# Patient Record
Sex: Male | Born: 1960
Health system: Southern US, Community
[De-identification: ages and names within clinical notes are randomized; demographics above are authoritative.]

## PROBLEM LIST (undated history)

## (undated) DIAGNOSIS — R569 Unspecified convulsions: Secondary | ICD-10-CM

## (undated) DIAGNOSIS — C801 Malignant (primary) neoplasm, unspecified: Secondary | ICD-10-CM

## (undated) DIAGNOSIS — R519 Headache, unspecified: Secondary | ICD-10-CM

---

## 2020-06-09 ENCOUNTER — Other Ambulatory Visit: Payer: Self-pay

## 2020-06-09 ENCOUNTER — Emergency Department (HOSPITAL_COMMUNITY): Payer: Self-pay

## 2020-06-09 ENCOUNTER — Inpatient Hospital Stay (HOSPITAL_COMMUNITY)
Admission: EM | Admit: 2020-06-09 | Discharge: 2020-06-16 | DRG: 025 | Disposition: A | Discharge status: Home / Self Care | Payer: Self-pay | Attending: Internal Medicine | Admitting: Internal Medicine

## 2020-06-09 DIAGNOSIS — C719 Malignant neoplasm of brain, unspecified: Secondary | ICD-10-CM

## 2020-06-09 DIAGNOSIS — Z781 Physical restraint status: Secondary | ICD-10-CM

## 2020-06-09 DIAGNOSIS — Z20822 Contact with and (suspected) exposure to covid-19: Secondary | ICD-10-CM | POA: Diagnosis present

## 2020-06-09 DIAGNOSIS — G40901 Epilepsy, unspecified, not intractable, with status epilepticus: Secondary | ICD-10-CM | POA: Diagnosis present

## 2020-06-09 DIAGNOSIS — R411 Anterograde amnesia: Secondary | ICD-10-CM | POA: Diagnosis present

## 2020-06-09 DIAGNOSIS — R410 Disorientation, unspecified: Secondary | ICD-10-CM

## 2020-06-09 DIAGNOSIS — G9389 Other specified disorders of brain: Secondary | ICD-10-CM

## 2020-06-09 DIAGNOSIS — G9341 Metabolic encephalopathy: Secondary | ICD-10-CM | POA: Diagnosis present

## 2020-06-09 DIAGNOSIS — D496 Neoplasm of unspecified behavior of brain: Principal | ICD-10-CM | POA: Diagnosis present

## 2020-06-09 DIAGNOSIS — E871 Hypo-osmolality and hyponatremia: Secondary | ICD-10-CM | POA: Diagnosis present

## 2020-06-09 DIAGNOSIS — E222 Syndrome of inappropriate secretion of antidiuretic hormone: Secondary | ICD-10-CM | POA: Diagnosis present

## 2020-06-09 LAB — COMPREHENSIVE METABOLIC PANEL
ALT: 23 U/L (ref 0–44)
AST: 30 U/L (ref 15–41)
Albumin: 4.5 g/dL (ref 3.5–5.0)
Alkaline Phosphatase: 55 U/L (ref 38–126)
Anion gap: 11 (ref 5–15)
BUN: 12 mg/dL (ref 6–20)
CO2: 25 mmol/L (ref 22–32)
Calcium: 8.9 mg/dL (ref 8.9–10.3)
Chloride: 90 mmol/L — ABNORMAL LOW (ref 98–111)
Creatinine, Ser: 0.78 mg/dL (ref 0.61–1.24)
GFR, Estimated: >60 mL/min (ref >60)
Glucose, Bld: 142 mg/dL — ABNORMAL HIGH (ref 70–99)
Potassium: 3.8 mmol/L (ref 3.5–5.1)
Sodium: 126 mmol/L — ABNORMAL LOW (ref 135–145)
Total Bilirubin: 1 mg/dL (ref 0.3–1.2)
Total Protein: 7.7 g/dL (ref 6.5–8.1)

## 2020-06-09 LAB — DIFFERENTIAL
Abs Immature Granulocytes: 0.07 10*3/uL (ref 0.00–0.07)
Basophils Absolute: 0 10*3/uL (ref 0.0–0.1)
Basophils Relative: 0 %
Eosinophils Absolute: 0 10*3/uL (ref 0.0–0.5)
Eosinophils Relative: 0 %
Immature Granulocytes: 1 %
Lymphocytes Relative: 9 %
Lymphs Abs: 0.8 10*3/uL (ref 0.7–4.0)
Monocytes Absolute: 0.4 10*3/uL (ref 0.1–1.0)
Monocytes Relative: 5 %
Neutro Abs: 7.7 10*3/uL (ref 1.7–7.7)
Neutrophils Relative %: 85 %

## 2020-06-09 LAB — CBC
HCT: 43.6 % (ref 39.0–52.0)
Hemoglobin: 14.9 g/dL (ref 13.0–17.0)
MCH: 28.1 pg (ref 26.0–34.0)
MCHC: 34.2 g/dL (ref 30.0–36.0)
MCV: 82.1 fL (ref 80.0–100.0)
Platelets: 307 10*3/uL (ref 150–400)
RBC: 5.31 MIL/uL (ref 4.22–5.81)
RDW: 12.7 % (ref 11.5–15.5)
WBC: 9.1 10*3/uL (ref 4.0–10.5)
nRBC: 0 % (ref 0.0–0.2)

## 2020-06-09 LAB — RAPID URINE DRUG SCREEN, HOSP PERFORMED
Amphetamines: NOT DETECTED
Barbiturates: NOT DETECTED
Benzodiazepines: NOT DETECTED
Cocaine: NOT DETECTED
Opiates: NOT DETECTED
Tetrahydrocannabinol: NOT DETECTED

## 2020-06-09 LAB — URINALYSIS, ROUTINE W REFLEX MICROSCOPIC
Bacteria, UA: NONE SEEN
Bilirubin Urine: NEGATIVE
Glucose, UA: 150 mg/dL — AB
Hgb urine dipstick: NEGATIVE
Ketones, ur: 5 mg/dL — AB
Leukocytes,Ua: NEGATIVE
Nitrite: NEGATIVE
Protein, ur: 30 mg/dL — AB
Specific Gravity, Urine: 1.028 (ref 1.005–1.030)
pH: 5 (ref 5.0–8.0)

## 2020-06-09 LAB — I-STAT CHEM 8, ED
BUN: 13 mg/dL (ref 6–20)
Calcium, Ion: 0.96 mmol/L — ABNORMAL LOW (ref 1.15–1.40)
Chloride: 92 mmol/L — ABNORMAL LOW (ref 98–111)
Creatinine, Ser: 0.6 mg/dL — ABNORMAL LOW (ref 0.61–1.24)
Glucose, Bld: 138 mg/dL — ABNORMAL HIGH (ref 70–99)
HCT: 43 % (ref 39.0–52.0)
Hemoglobin: 14.6 g/dL (ref 13.0–17.0)
Potassium: 3.9 mmol/L (ref 3.5–5.1)
Sodium: 126 mmol/L — ABNORMAL LOW (ref 135–145)
TCO2: 26 mmol/L (ref 22–32)

## 2020-06-09 LAB — PROTIME-INR
INR: 1 (ref 0.8–1.2)
Prothrombin Time: 12.6 seconds (ref 11.4–15.2)

## 2020-06-09 LAB — CBG MONITORING, ED: Glucose-Capillary: 129 mg/dL — ABNORMAL HIGH (ref 70–99)

## 2020-06-09 LAB — ETHANOL: Alcohol, Ethyl (B): <10 mg/dL (ref <10)

## 2020-06-09 LAB — RESPIRATORY PANEL BY RT PCR (FLU A&B, COVID)
Influenza A by PCR: NEGATIVE
Influenza B by PCR: NEGATIVE
SARS Coronavirus 2 by RT PCR: NEGATIVE

## 2020-06-09 LAB — APTT: aPTT: 25 seconds (ref 24–36)

## 2020-06-09 IMAGING — CT CT HEAD W/O CM
4 series · 15 of 47 positions shown, 17 images · non-contrast
Comparison: [DATE] MRI/MRA head.  [DATE] head CT.
COMPARISON: [DATE] MRI/MRA head.  [DATE] head CT.

Addendum:
CLINICAL DATA: Mental status change, unknown cause.

EXAM:
CT HEAD WITHOUT CONTRAST
TECHNIQUE: Contiguous axial images were obtained from the base of the skull
through the vertex without intravenous contrast.

[Series 4: head wo · axial · 0.43mm/px · z∈[+1258,+1384]mm · 7 of 35 slices shown, 9 images]
[im 5/35  brain]
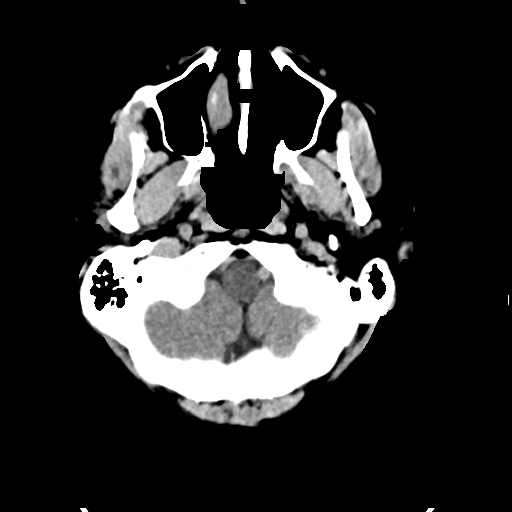
[im 5/35  bone]
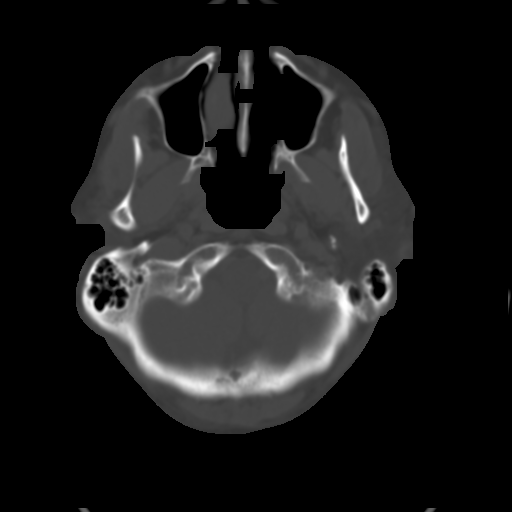
[im 9/35  brain]
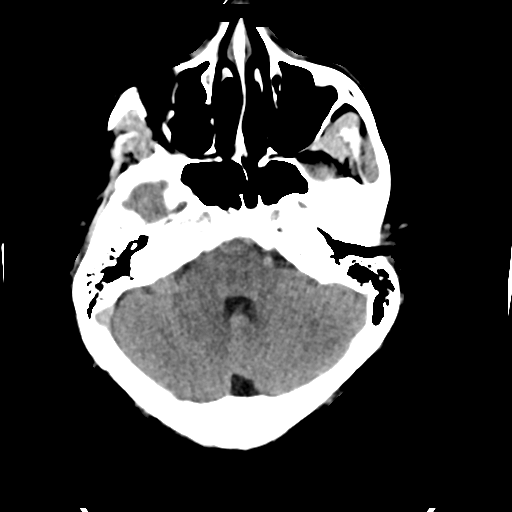
[im 13/35  brain]
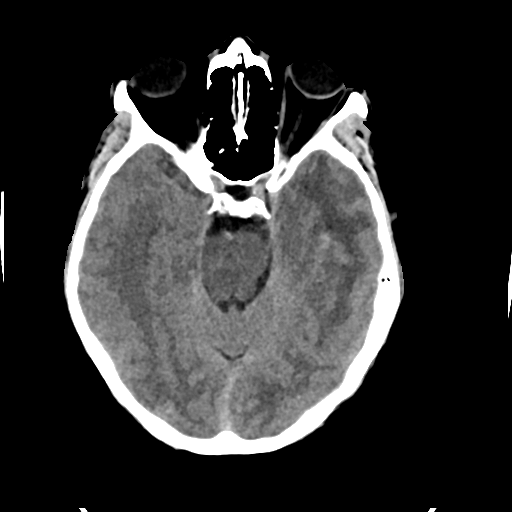
[im 18/35  brain]
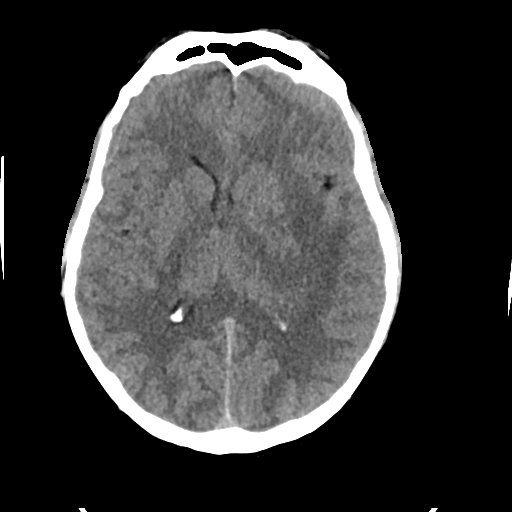
[im 22/35  brain]
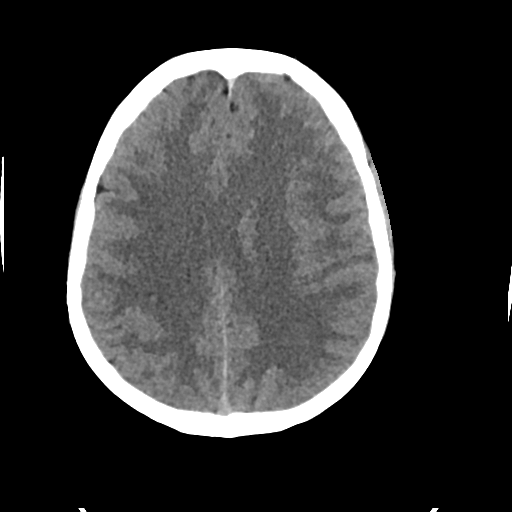
[im 22/35  bone]
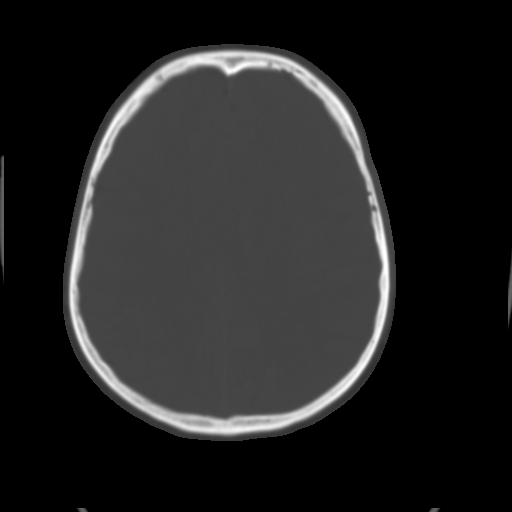
[im 26/35  brain]
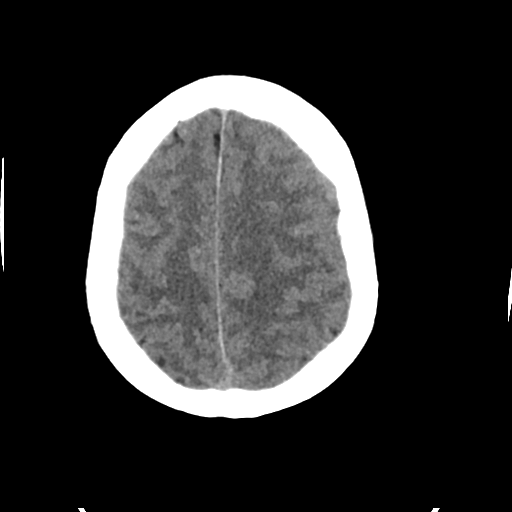
[im 30/35  brain]
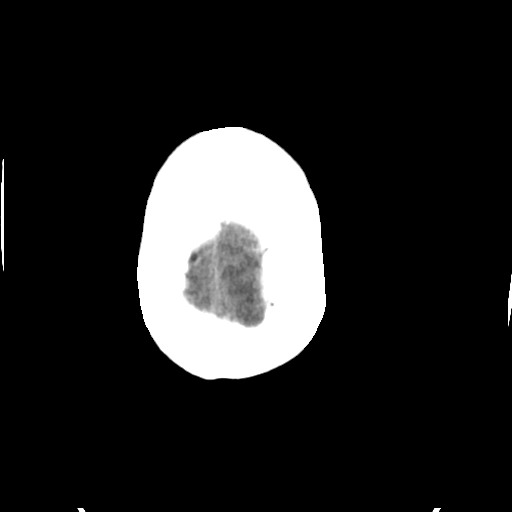

[Series 5: head bone · axial · 0.43mm/px · z∈[+1254,+1272]mm · 2 of 88 slices shown]
[im 9/88  bone]
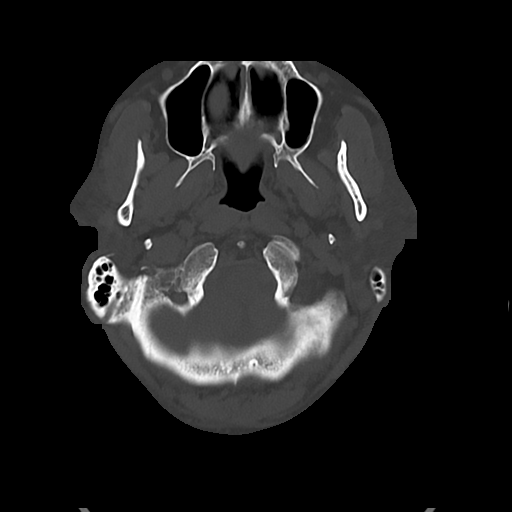
[im 18/88  bone]
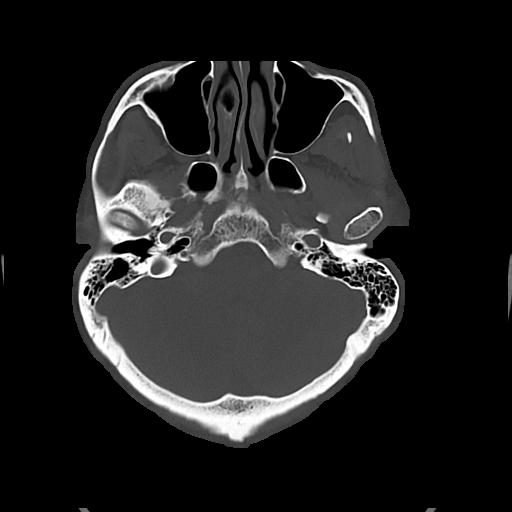

[Series 6: cor soft · coronal · 0.33mm/px · 3 of 79 slices shown]
[im 27/79  brain]
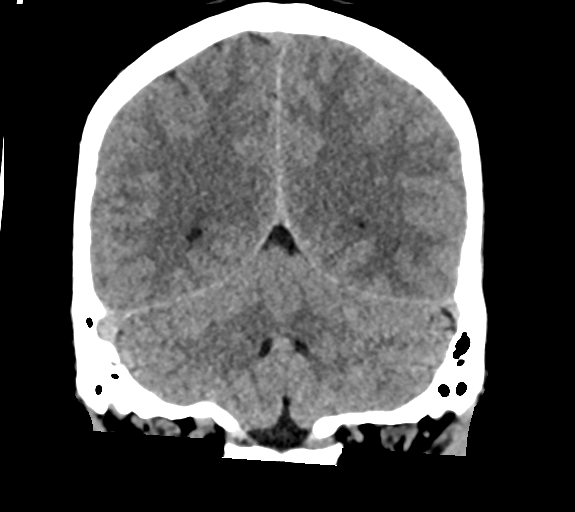
[im 35/79  brain]
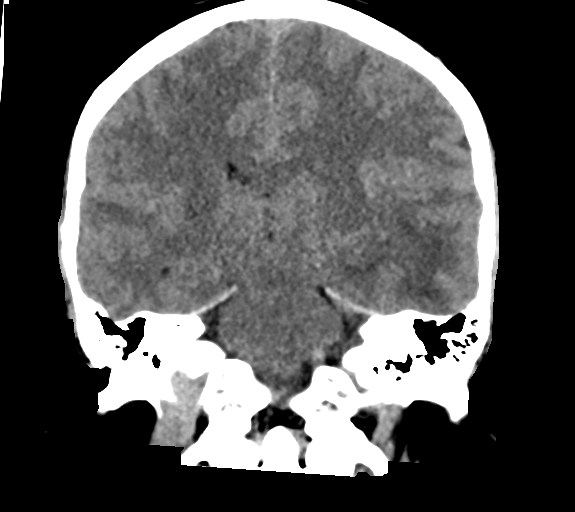
[im 44/79  brain]
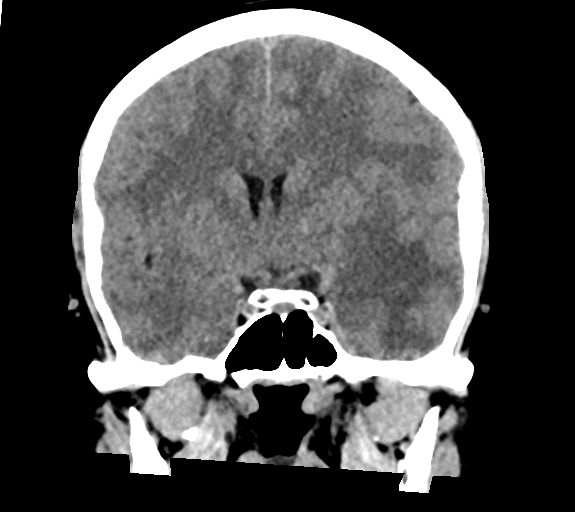

[Series 7: sag soft · sagittal · 0.42mm/px · 3 of 61 slices shown]
[im 21/61  brain]
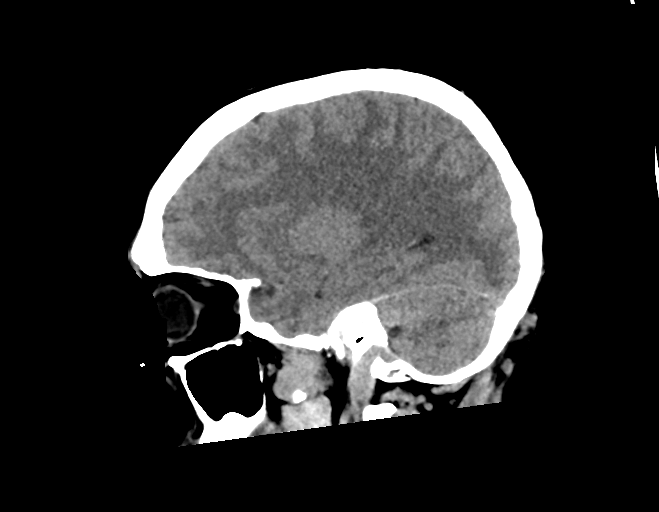
[im 31/61  brain]
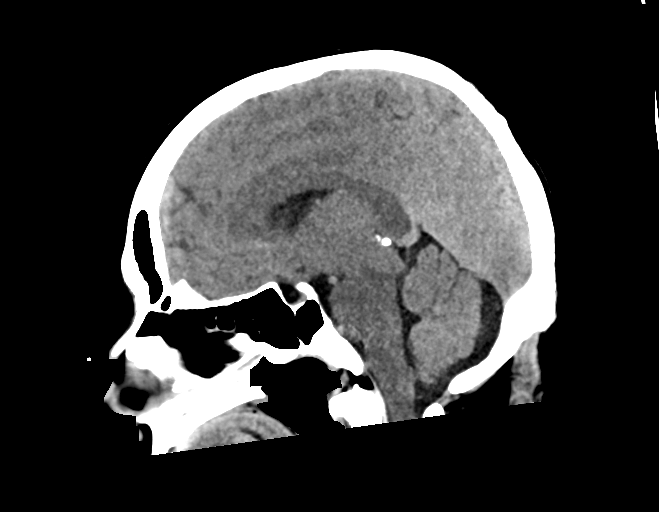
[im 41/61  brain]
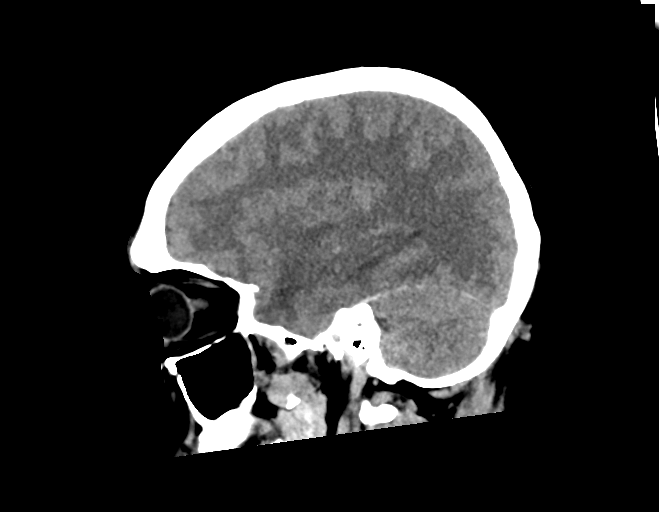

[15 of 47 positions shown; findings below may reference images not displayed]

FINDINGS: Brain: Redemonstration of infiltrative hypodensity involving the
left temporal lobe. Partial effacement of the left lateral
ventricle, increased when compared to prior MRI. Abutment of the
left midbrain by the mesial left temporal lobe appears more
conspicuous.

New rightward midline shift of 4 mm. No ventriculomegaly or
extra-axial fluid collection. No intracranial hemorrhage.

Vascular: No hyperdense vessel or unexpected calcification.

Skull: No acute finding.

Sinuses/Orbits: Normal orbits. Clear paranasal sinuses. No mastoid
effusion.

Other: None.
IMPRESSION: Redemonstration of infiltrative left temporal lesion.

Increased partial effacement of the left lateral ventricle and
abutment of the left midbrain. New rightward midline shift of 4 mm.

MRI head with and without contrast is recommended for better
evaluation.

ADDENDUM:
These results were called by telephone at the time of interpretation
on [DATE] at [DATE] to provider Dr. CENAB, who verbally
acknowledged these results.

*** End of Addendum ***
FINDINGS: Brain: Redemonstration of infiltrative hypodensity involving the
left temporal lobe. Partial effacement of the left lateral
ventricle, increased when compared to prior MRI. Abutment of the
left midbrain by the mesial left temporal lobe appears more
conspicuous.

New rightward midline shift of 4 mm. No ventriculomegaly or
extra-axial fluid collection. No intracranial hemorrhage.

Vascular: No hyperdense vessel or unexpected calcification.

Skull: No acute finding.

Sinuses/Orbits: Normal orbits. Clear paranasal sinuses. No mastoid
effusion.

Other: None.
IMPRESSION: Redemonstration of infiltrative left temporal lesion.

Increased partial effacement of the left lateral ventricle and
abutment of the left midbrain. New rightward midline shift of 4 mm.

MRI head with and without contrast is recommended for better
evaluation.

## 2020-06-09 MED ORDER — LORAZEPAM 2 MG/ML IJ SOLN
1.0000 mg | Freq: Once | INTRAMUSCULAR | Status: AC | PRN
  Administered 2020-06-10: 1 mg via INTRAVENOUS
  Filled 2020-06-09: qty 1

## 2020-06-09 NOTE — ED Triage Notes (Addendum)
Pt arrives to ED BIB [facility] for AMS. 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 Per EMS pt's boss notice that the pt was not making sense and would not stop to make his delivery. Per EMS the truck has a camera the allowed the [employer] Nurse to talk to pt and per the nurse pt was not making sense and would follow commands. Per EMS [employer] had manually turn the truck off because pt would not stop the truck when asked to. Pt's primary language is Spanish and while talking pt switches from [place] to [place] while talking. This RN asked the pt what had happened and pt stated "Someone gave me some meat from the beast to eat" Pt is currently oriented to self but not to place,time nor situation.  BP 210/120 HR 133 CBG 148

## 2020-06-09 NOTE — ED Notes (Signed)
Patient transported to CT 

## 2020-06-10 ENCOUNTER — Inpatient Hospital Stay (HOSPITAL_COMMUNITY): Payer: Self-pay

## 2020-06-10 ENCOUNTER — Encounter (HOSPITAL_COMMUNITY): Payer: Self-pay | Admitting: Internal Medicine

## 2020-06-10 ENCOUNTER — Observation Stay (HOSPITAL_COMMUNITY): Payer: Self-pay

## 2020-06-10 DIAGNOSIS — C719 Malignant neoplasm of brain, unspecified: Secondary | ICD-10-CM

## 2020-06-10 DIAGNOSIS — G9389 Other specified disorders of brain: Secondary | ICD-10-CM

## 2020-06-10 DIAGNOSIS — E871 Hypo-osmolality and hyponatremia: Secondary | ICD-10-CM

## 2020-06-10 LAB — BASIC METABOLIC PANEL
Anion gap: 9 (ref 5–15)
Anion gap: 9 (ref 5–15)
Anion gap: 8 (ref 5–15)
BUN: 9 mg/dL (ref 6–20)
BUN: 8 mg/dL (ref 6–20)
BUN: 8 mg/dL (ref 6–20)
CO2: 24 mmol/L (ref 22–32)
CO2: 25 mmol/L (ref 22–32)
CO2: 27 mmol/L (ref 22–32)
Calcium: 8.3 mg/dL — ABNORMAL LOW (ref 8.9–10.3)
Calcium: 8.4 mg/dL — ABNORMAL LOW (ref 8.9–10.3)
Calcium: 8.6 mg/dL — ABNORMAL LOW (ref 8.9–10.3)
Chloride: 92 mmol/L — ABNORMAL LOW (ref 98–111)
Chloride: 88 mmol/L — ABNORMAL LOW (ref 98–111)
Chloride: 90 mmol/L — ABNORMAL LOW (ref 98–111)
Creatinine, Ser: 0.7 mg/dL (ref 0.61–1.24)
Creatinine, Ser: 0.7 mg/dL (ref 0.61–1.24)
Creatinine, Ser: 0.71 mg/dL (ref 0.61–1.24)
GFR, Estimated: >60 mL/min (ref >60)
GFR, Estimated: >60 mL/min (ref >60)
GFR, Estimated: >60 mL/min (ref >60)
Glucose, Bld: 121 mg/dL — ABNORMAL HIGH (ref 70–99)
Glucose, Bld: 122 mg/dL — ABNORMAL HIGH (ref 70–99)
Glucose, Bld: 118 mg/dL — ABNORMAL HIGH (ref 70–99)
Potassium: 3.6 mmol/L (ref 3.5–5.1)
Potassium: 3.5 mmol/L (ref 3.5–5.1)
Potassium: 4 mmol/L (ref 3.5–5.1)
Sodium: 125 mmol/L — ABNORMAL LOW (ref 135–145)
Sodium: 122 mmol/L — ABNORMAL LOW (ref 135–145)
Sodium: 125 mmol/L — ABNORMAL LOW (ref 135–145)

## 2020-06-10 LAB — CBC WITH DIFFERENTIAL/PLATELET
Abs Immature Granulocytes: 0.04 10*3/uL (ref 0.00–0.07)
Basophils Absolute: 0 10*3/uL (ref 0.0–0.1)
Basophils Relative: 0 %
Eosinophils Absolute: 0 10*3/uL (ref 0.0–0.5)
Eosinophils Relative: 0 %
HCT: 38.2 % — ABNORMAL LOW (ref 39.0–52.0)
Hemoglobin: 13.1 g/dL (ref 13.0–17.0)
Immature Granulocytes: 0 %
Lymphocytes Relative: 13 %
Lymphs Abs: 1.3 10*3/uL (ref 0.7–4.0)
MCH: 28.6 pg (ref 26.0–34.0)
MCHC: 34.3 g/dL (ref 30.0–36.0)
MCV: 83.4 fL (ref 80.0–100.0)
Monocytes Absolute: 0.7 10*3/uL (ref 0.1–1.0)
Monocytes Relative: 8 %
Neutro Abs: 7.5 10*3/uL (ref 1.7–7.7)
Neutrophils Relative %: 79 %
Platelets: 283 10*3/uL (ref 150–400)
RBC: 4.58 MIL/uL (ref 4.22–5.81)
RDW: 12.7 % (ref 11.5–15.5)
WBC: 9.6 10*3/uL (ref 4.0–10.5)
nRBC: 0 % (ref 0.0–0.2)

## 2020-06-10 LAB — TSH: TSH: 1.578 u[IU]/mL (ref 0.350–4.500)

## 2020-06-10 LAB — OSMOLALITY, URINE: Osmolality, Ur: 874 mOsm/kg (ref 300–900)

## 2020-06-10 LAB — HIV ANTIBODY (ROUTINE TESTING W REFLEX): HIV Screen 4th Generation wRfx: NONREACTIVE

## 2020-06-10 LAB — SODIUM: Sodium: 126 mmol/L — ABNORMAL LOW (ref 135–145)

## 2020-06-10 LAB — CORTISOL: Cortisol, Plasma: 21.1 ug/dL

## 2020-06-10 LAB — OSMOLALITY: Osmolality: 262 mOsm/kg — ABNORMAL LOW (ref 275–295)

## 2020-06-10 LAB — SODIUM, URINE, RANDOM: Sodium, Ur: 121 mmol/L

## 2020-06-10 IMAGING — CT CT ABD-PELV W/ CM
2 of 5 series · 13 of 36 positions shown, 16 images · IV contrast (APPLIED)
Comparison: Brain MRI, [DATE]. Abdomen radiographs, [DATE].

CLINICAL DATA: Brain/CNS neoplasm. Evaluate for metastatic
disease/primary neoplasm.

EXAM:
CT CHEST, ABDOMEN, AND PELVIS WITH CONTRAST
TECHNIQUE: Multidetector CT imaging of the chest, abdomen and pelvis was
performed following the standard protocol during bolus
administration of intravenous contrast.
CONTRAST:  100mL OMNIPAQUE IOHEXOL 300 MG/ML  SOLN

[Series 3: cap 5.0 i31f 2 · axial · 0.71mm/px · z∈[+932,+1472]mm · 10 of 134 slices shown, 13 images]
[im 13/134  mediastinal]
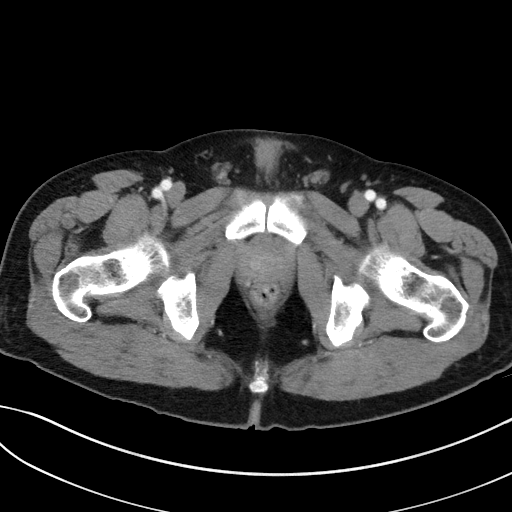
[im 13/134  lung]
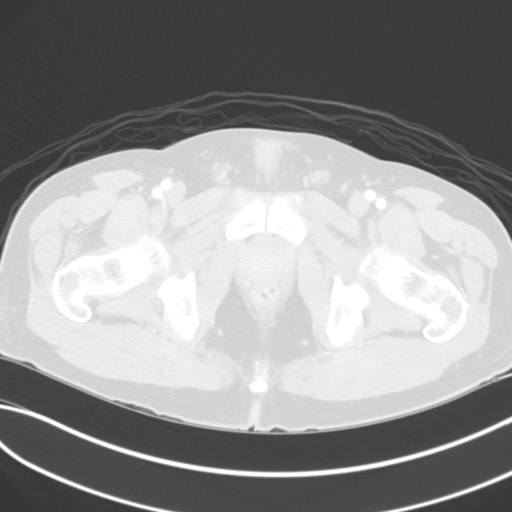
[im 25/134  lung]
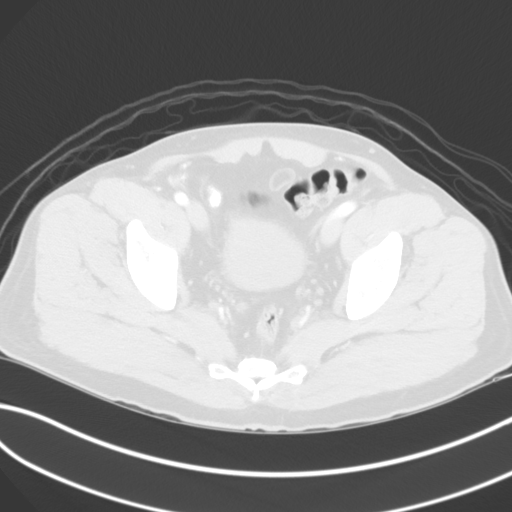
[im 37/134  lung]
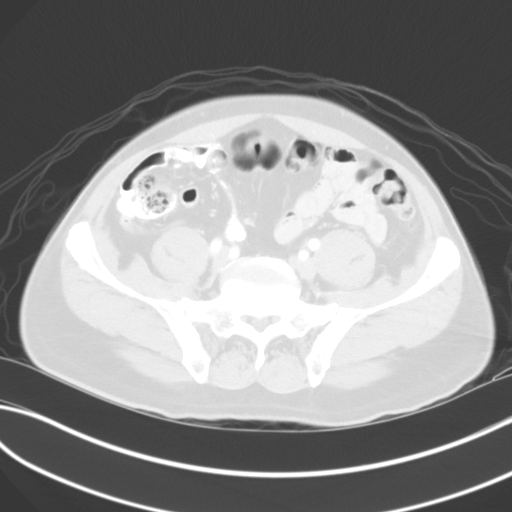
[im 49/134  lung]
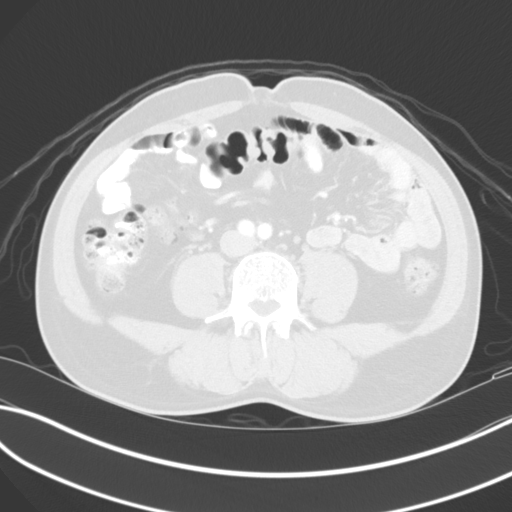
[im 61/134  mediastinal]
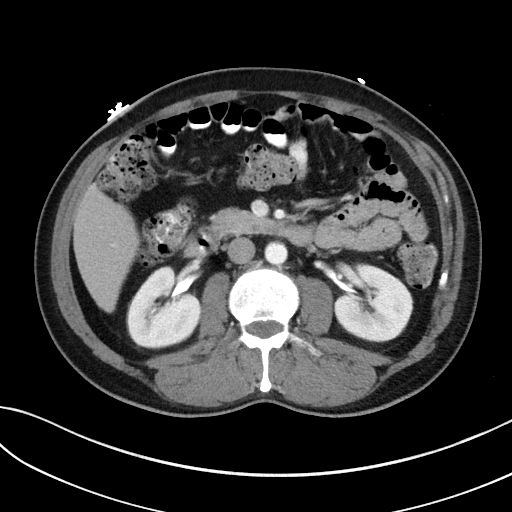
[im 61/134  lung]
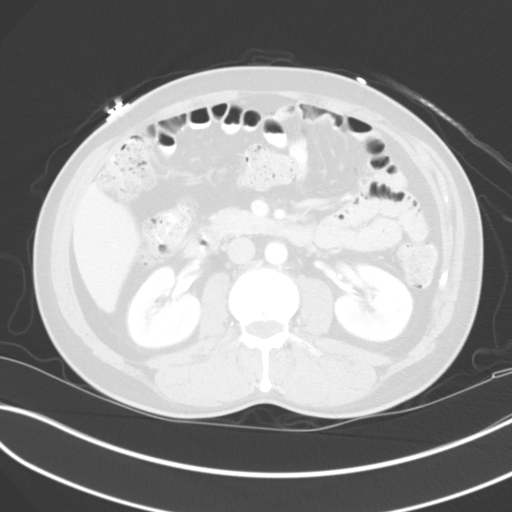
[im 73/134  lung]
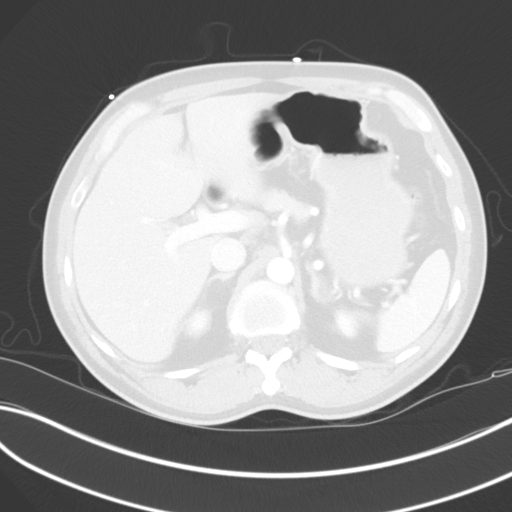
[im 85/134  lung]
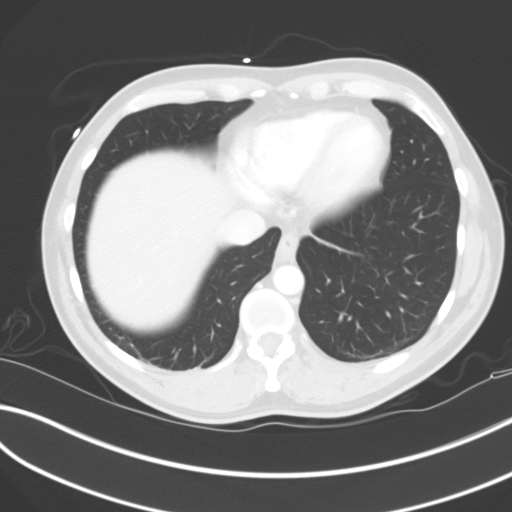
[im 97/134  lung]
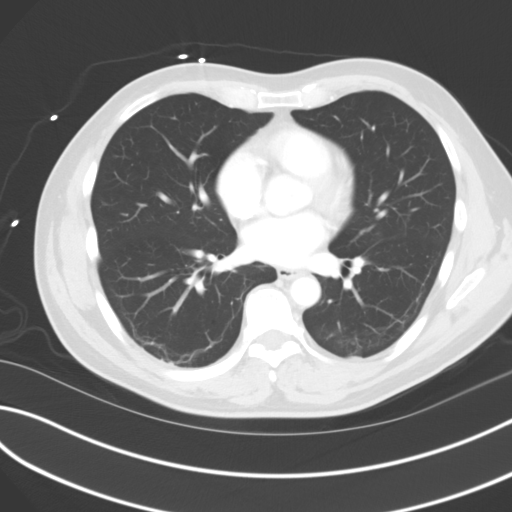
[im 109/134  mediastinal]
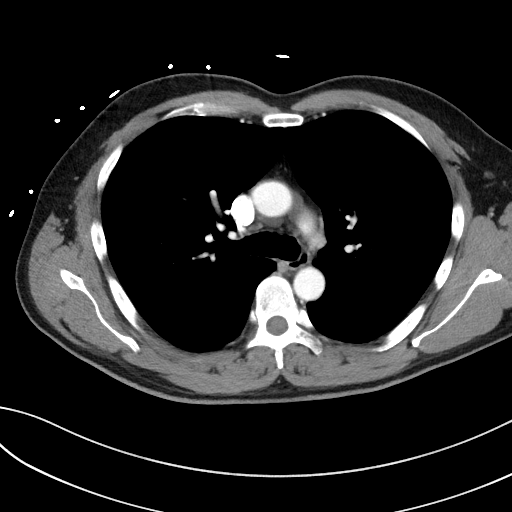
[im 109/134  lung]
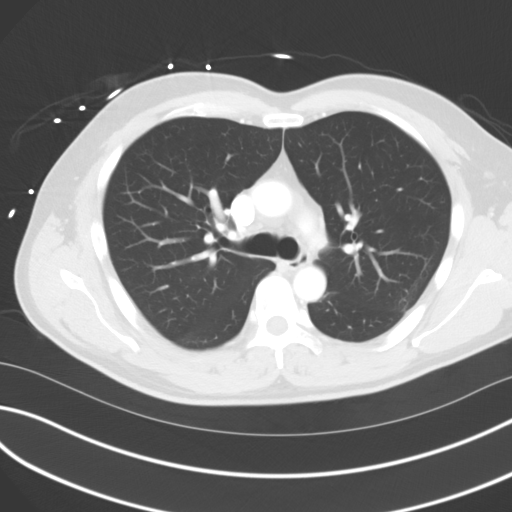
[im 121/134  lung]
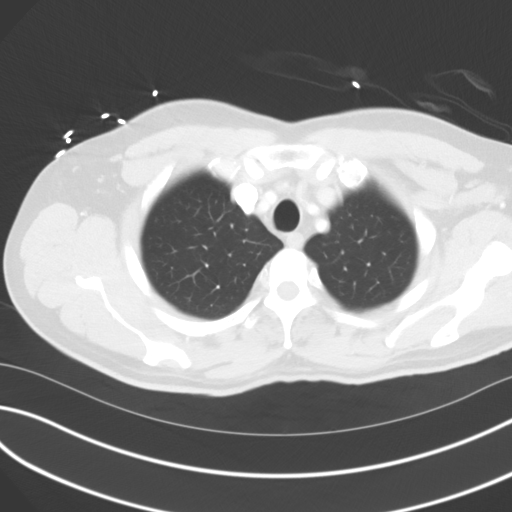

[Series 6: coronal · coronal · 0.73mm/px · 3 of 151 slices shown]
[im 31/151  lung]
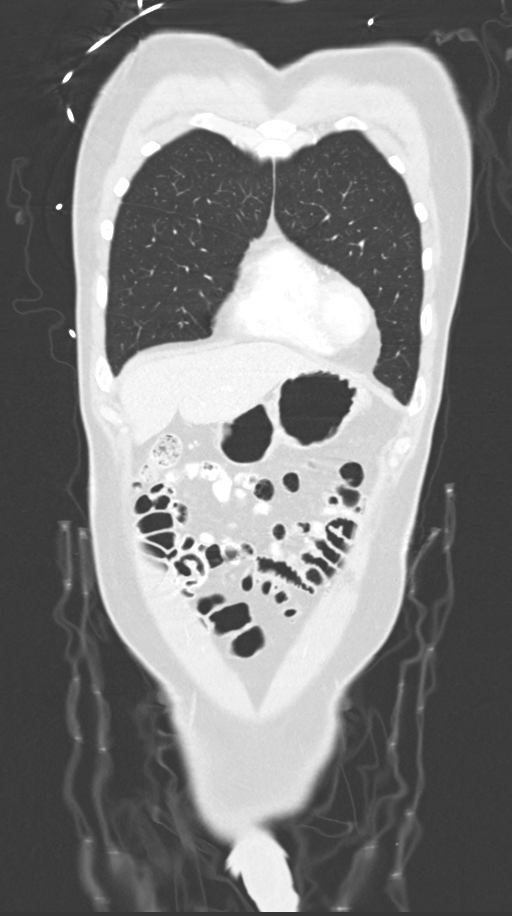
[im 61/151  lung]
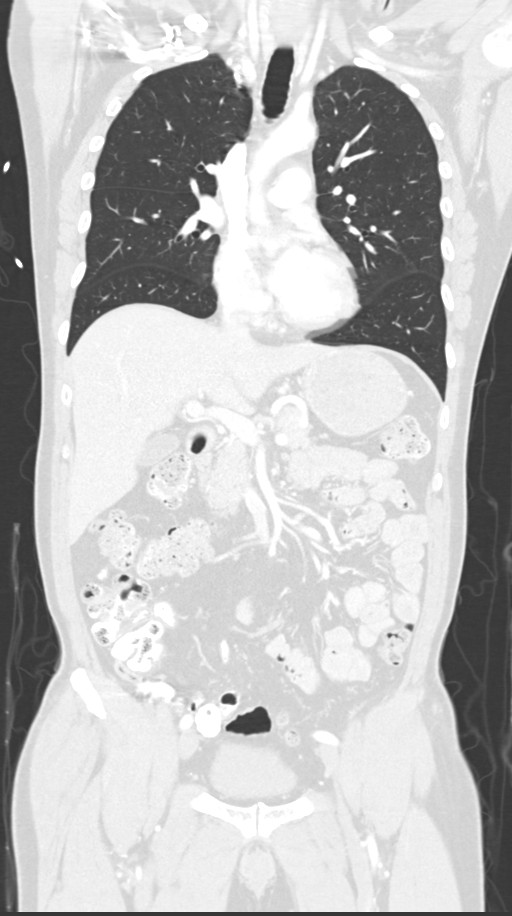
[im 91/151  lung]
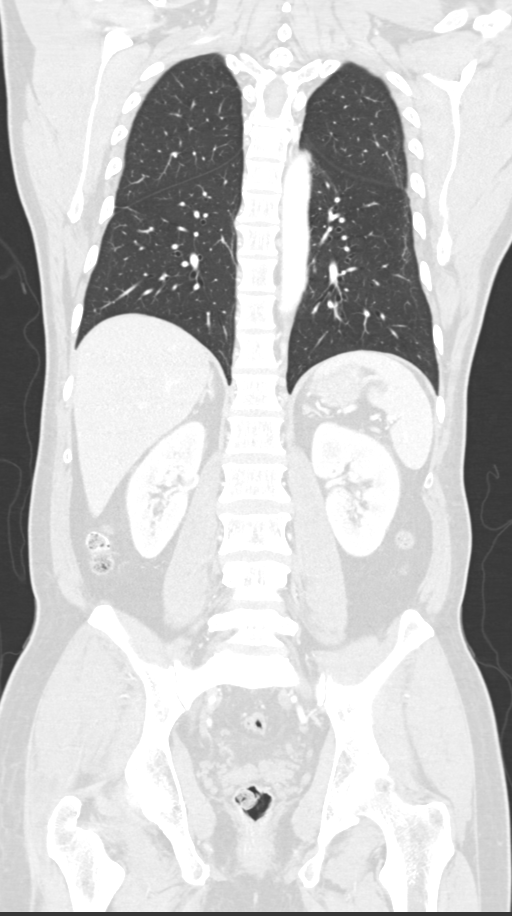

[13 of 36 positions shown; findings below may reference images not displayed]

FINDINGS: CT CHEST FINDINGS

Cardiovascular: Heart normal in size and configuration. No
pericardial effusion. No coronary artery calcifications. Normal
great vessels and widely patent aortic arch branch vessels.

Mediastinum/Nodes: Normal thyroid. No neck base, axillary,
mediastinal or hilar masses or enlarged lymph nodes. Normal trachea
and esophagus.

Lungs/Pleura: Minor dependent atelectasis. Calcified granuloma in
the left upper lobe lingula. No evidence of pneumonia or pulmonary
edema. No lung mass or suspicious nodule. No pleural effusion or
pneumothorax.

Musculoskeletal: No fracture or bone lesion. No significant skeletal
abnormality. No chest wall mass.

CT ABDOMEN PELVIS FINDINGS

Hepatobiliary: No focal liver abnormality is seen. No gallstones,
gallbladder wall thickening, or biliary dilatation.

Pancreas: Unremarkable. No pancreatic ductal dilatation or
surrounding inflammatory changes.

Spleen: Normal in size without focal abnormality.

Adrenals/Urinary Tract: Normal adrenal glands.

Kidneys normal in size, orientation and position with symmetric
enhancement and excretion. No renal masses. No hydronephrosis.
Normal ureters. Bladder unremarkable.

Stomach/Bowel: Stomach is within normal limits. Appendix appears
normal. No evidence of bowel wall thickening, distention, or
inflammatory changes.

Vascular/Lymphatic: No significant vascular findings are present. No
enlarged abdominal or pelvic lymph nodes.

Reproductive: Prostate mildly enlarged, 4.8 x 3.5 cm transversely.

Other: No abdominal wall hernia or abnormality. No abdominopelvic
ascites.

Musculoskeletal: Chronic bilateral pars defects at L5-S1. Minimal
anterolisthesis. Skeletal structures otherwise unremarkable.
IMPRESSION: 1. No evidence of a primary malignancy or metastatic disease within
the chest, abdomen or pelvis.
2. No acute findings within the chest, abdomen or pelvis.

## 2020-06-10 IMAGING — MR MR HEAD WO/W CM
16 of 18 series · 42 of 48 positions shown · IV contrast (gadavist)
Comparison: Brain MRI [DATE]

CLINICAL DATA: Brain mass

EXAM:
MRI HEAD WITHOUT AND WITH CONTRAST
TECHNIQUE: Multiplanar, multiecho pulse sequences of the brain and surrounding
structures were obtained without and with intravenous contrast.
CONTRAST:  7.5mL GADAVIST GADOBUTROL 1 MMOL/ML IV SOLN

[Series 5: DWI · axial · 3.0mm · 0.88mm/px · z∈[-110,+31]mm · 7 of 96 slices shown (1 of 4)]
[im 1/96]
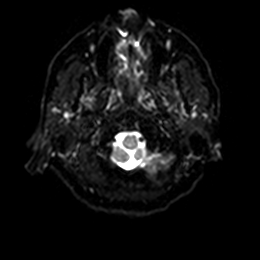
[im 16/96]
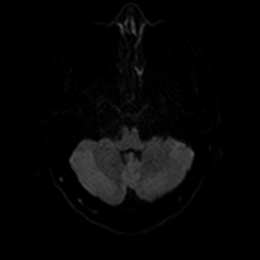
[im 32/96]
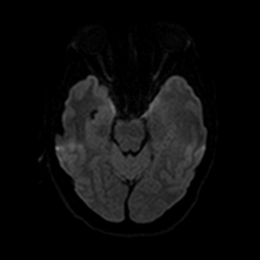
[im 48/96]
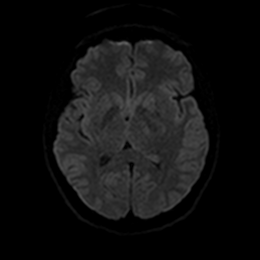
[im 64/96]
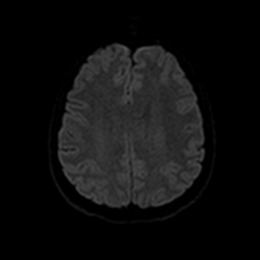
[im 80/96]
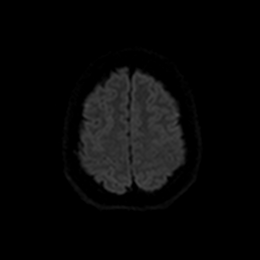
[im 96/96]
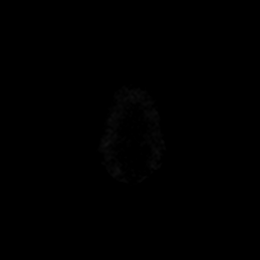

[Series 6: DWI · axial · 3.0mm · 0.88mm/px · z∈[-110,+31]mm · 4 of 48 slices shown (2 of 4)]
[im 1/48]
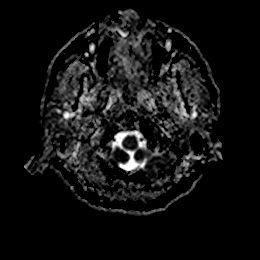
[im 16/48]
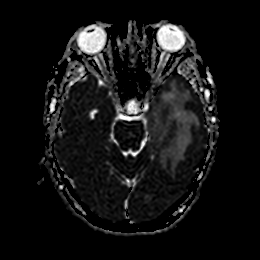
[im 32/48]
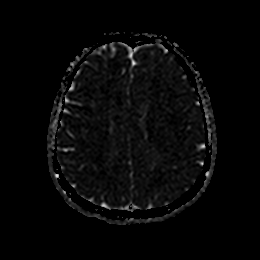
[im 48/48]
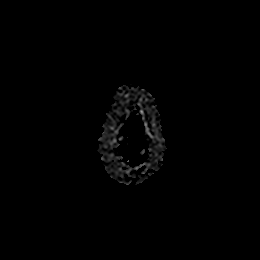

[Series 7: DWI · coronal · 4.0mm · 0.88mm/px · 5 of 70 slices shown (3 of 4)]
[im 1/70]
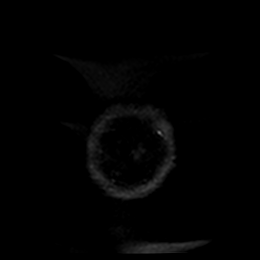
[im 18/70]
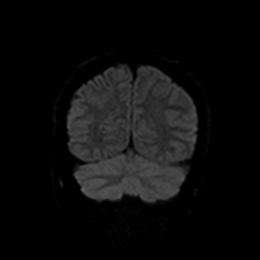
[im 35/70]
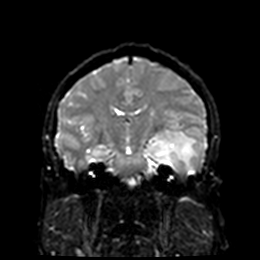
[im 52/70]
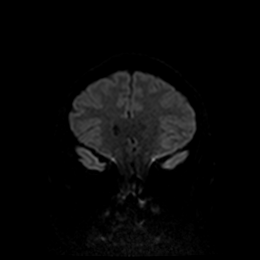
[im 70/70]
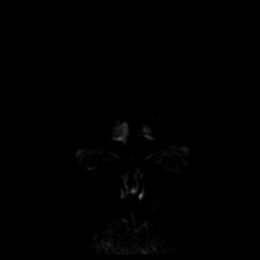

[Series 8: DWI · coronal · 4.0mm · 0.88mm/px · 2 of 35 slices shown (4 of 4)]
[im 1/35]
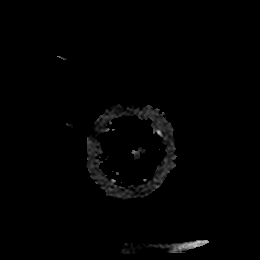
[im 35/35]
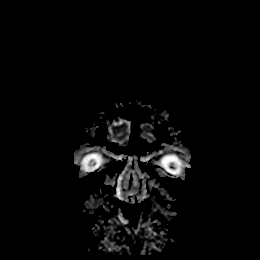

[Series 9: T1 · sagittal · 5.0mm · 0.72mm/px · 1 of 25 slices shown]
[im 1/25]
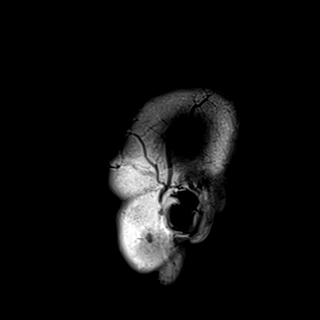

[Series 10: T2 · axial · 5.0mm · 0.72mm/px · 1 of 25 slices shown (1 of 2)]
[im 1/25]
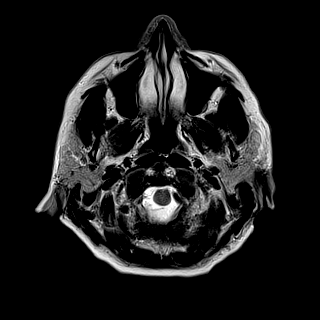

[Series 11: FLAIR · axial · 5.0mm · 0.45mm/px · 1 of 25 slices shown (1 of 2)]
[im 1/25]
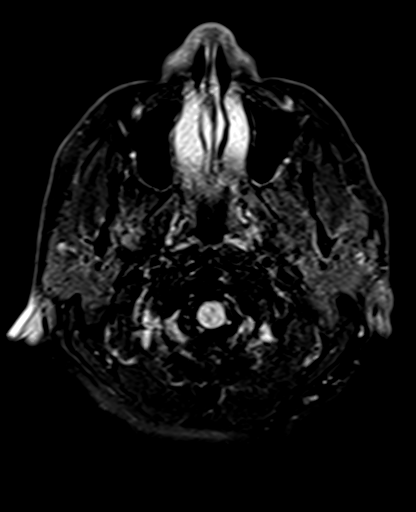

[Series 12: mag_images · axial · 3.0mm · 0.90mm/px · z∈[-105,+60]mm · 3 of 56 slices shown]
[im 1/56]
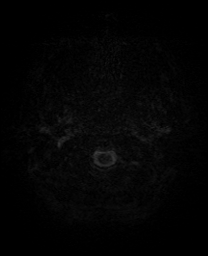
[im 28/56]
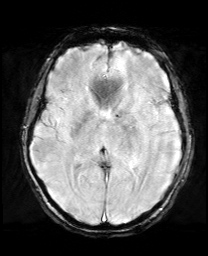
[im 56/56]
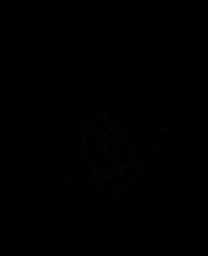

[Series 13: pha_images · axial · 3.0mm · 0.90mm/px · z∈[-105,+54]mm · 3 of 54 slices shown]
[im 1/54]
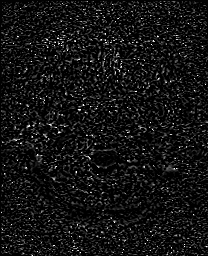
[im 27/54]
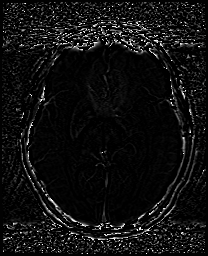
[im 54/54]
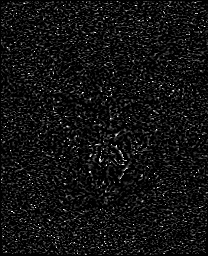

[Series 14: swi_images · axial · 3.0mm · 0.90mm/px · z∈[-105,+60]mm · 3 of 56 slices shown]
[im 1/56]
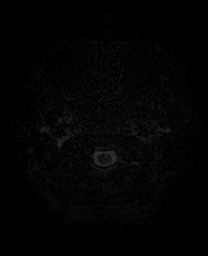
[im 28/56]
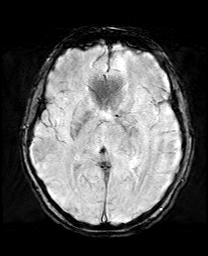
[im 56/56]
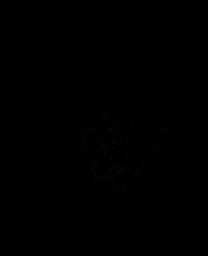

[Series 15: mip_images(sw) · axial · 24.0mm · 0.90mm/px · z∈[-94,+49]mm · 3 of 49 slices shown]
[im 1/49]
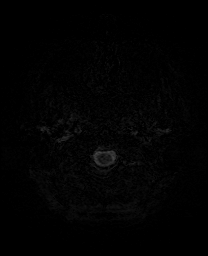
[im 25/49]
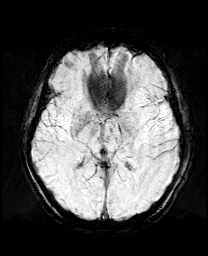
[im 49/49]
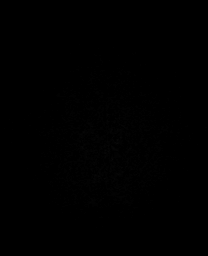

[Series 17: T2 · coronal · 3.0mm · 0.27mm/px · 2 of 32 slices shown (2 of 2)]
[im 1/32]
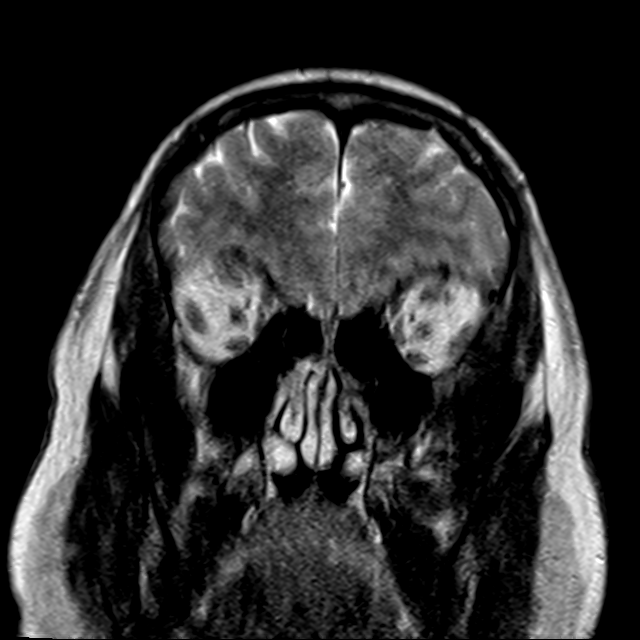
[im 32/32]
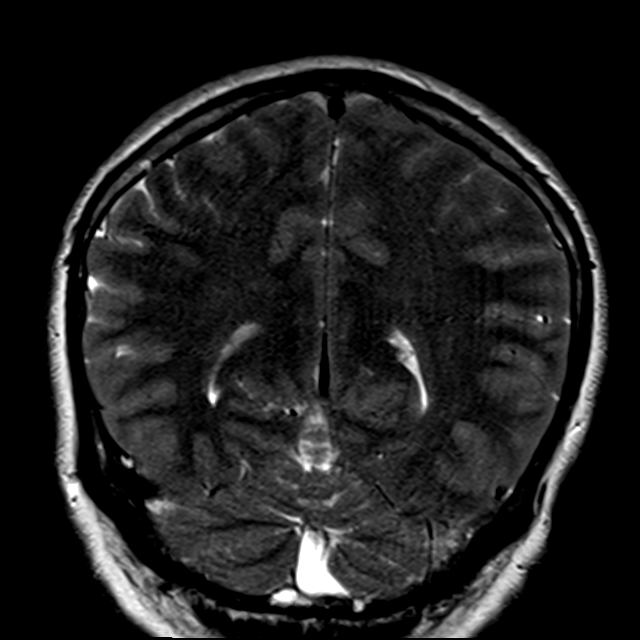

[Series 18: FLAIR · coronal · 3.0mm · 0.56mm/px · 2 of 32 slices shown (2 of 2)]
[im 1/32]
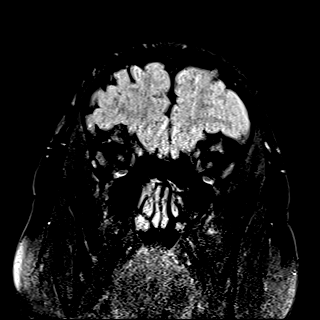
[im 32/32]
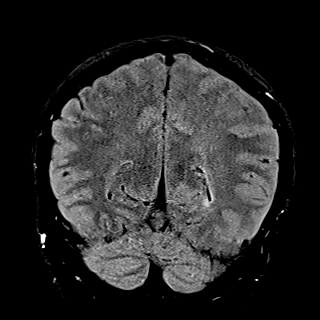

[Series 19: T2 post-contrast · coronal · 5.0mm · 0.72mm/px · 2 of 31 slices shown]
[im 1/31]
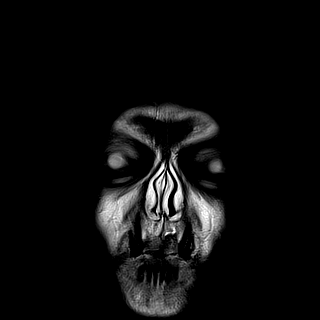
[im 31/31]
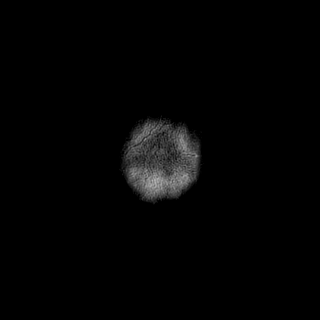

[Series 21: T1 post-contrast · coronal · 5.0mm · 0.34mm/px · 2 of 31 slices shown (1 of 2)]
[im 1/31]
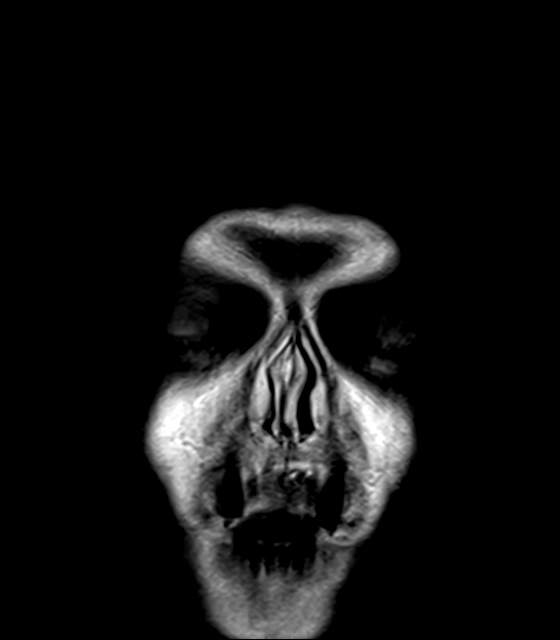
[im 31/31]
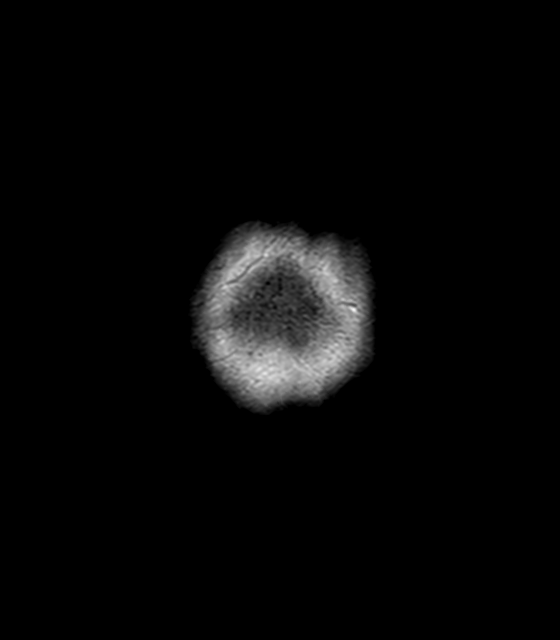

[Series 22: T1 post-contrast · sagittal · 5.0mm · 0.72mm/px · 1 of 25 slices shown (2 of 2)]
[im 1/25]
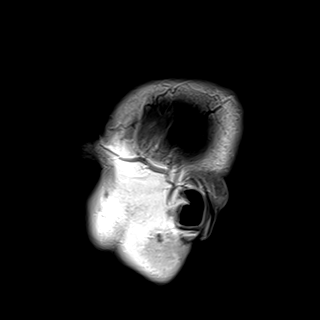

[42 of 48 positions shown; findings below may reference images not displayed]

FINDINGS: Brain: No acute infarct, acute hemorrhage or extra-axial collection.
Slight worsening of abnormal hyperintense T2-weighted signal within
the left temporal lobe. Mass effect on the left lateral ventricle
and brainstem is slightly increased. There is no contrast
enhancement within the lesion. No chronic microhemorrhage. Normal
midline structures.

Vascular: Normal flow voids.

Skull and upper cervical spine: Normal marrow signal.

Sinuses/Orbits: Negative.

Other: None.
IMPRESSION: Slight worsening of infiltrative tumor of the left temporal lobe,
with slight worsening of mass effect on the left lateral ventricle
and brainstem.

## 2020-06-10 IMAGING — DX DG ABDOMEN 1V
1 series · 1 of 1 positions shown · non-contrast
Comparison: None.

CLINICAL DATA: MRI clearance

EXAM:
ABDOMEN - 1 VIEW

[abdomen]
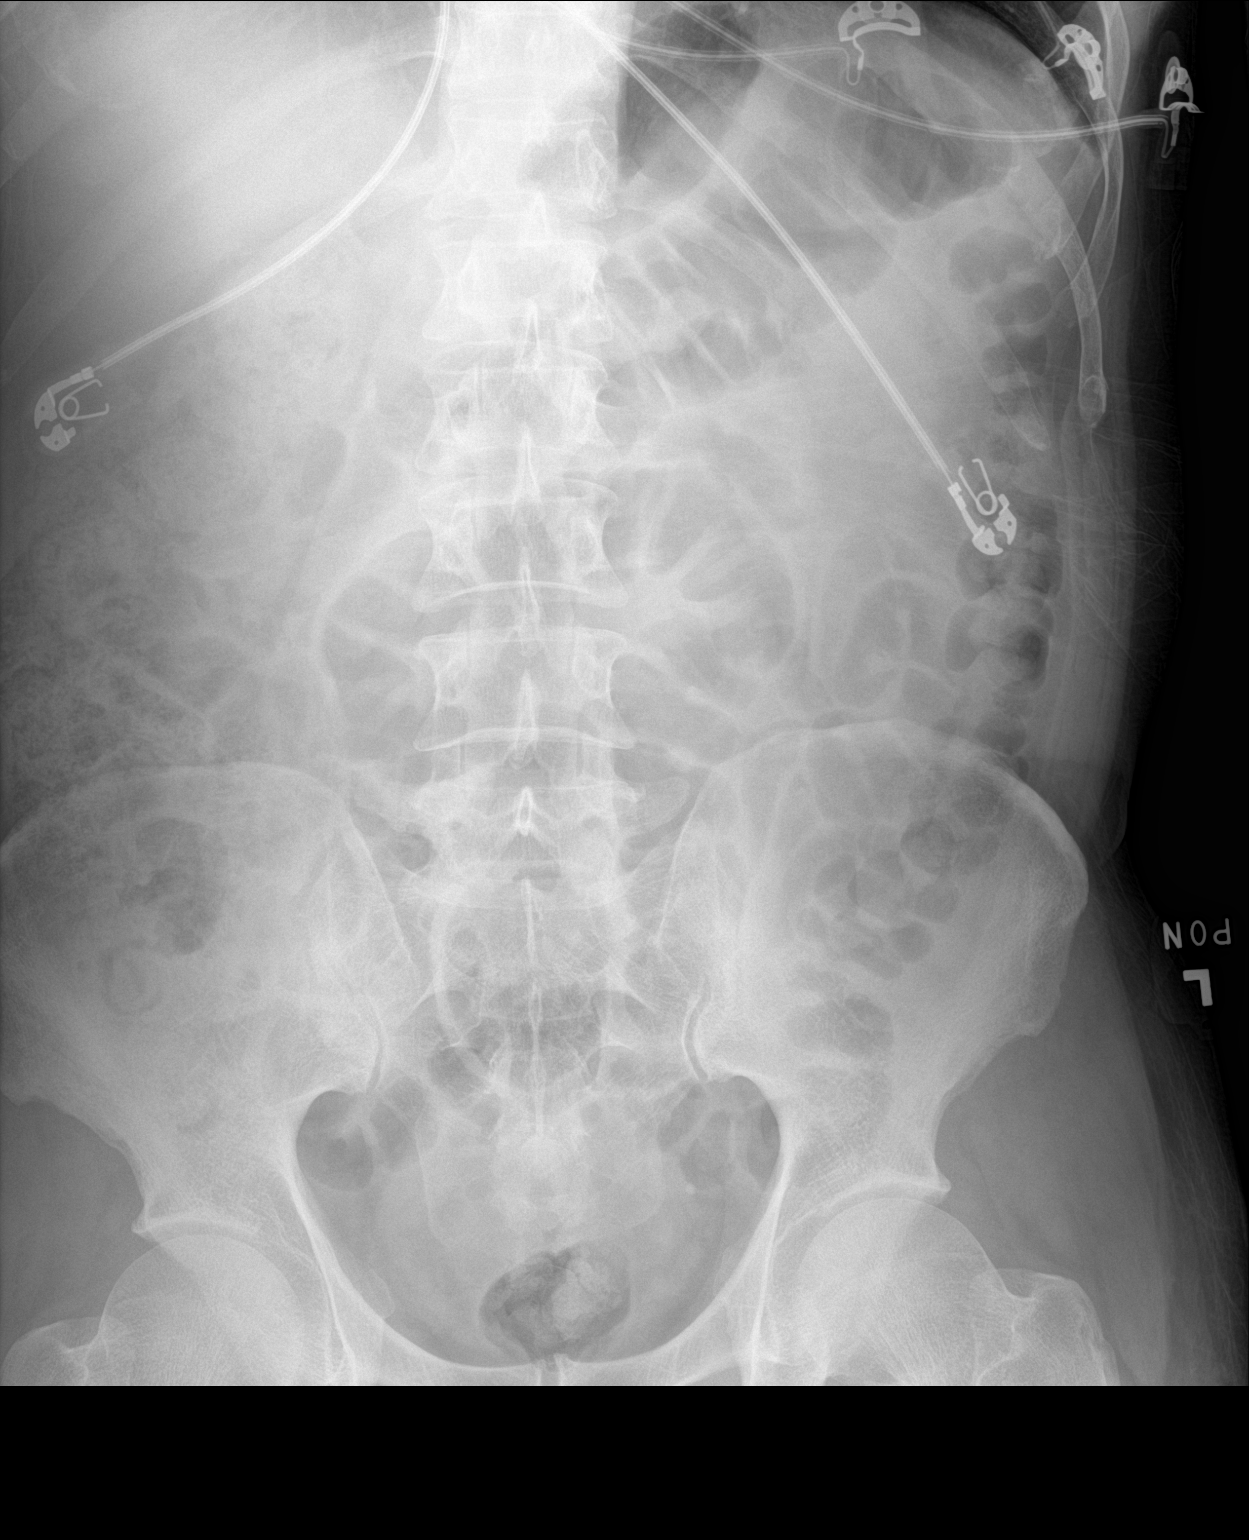

[1 of 1 positions shown; findings below may reference images not displayed]

FINDINGS: The bowel gas pattern is normal. No radio-opaque calculi or other
significant radiographic abnormality are seen. No metallic foreign
body.
IMPRESSION: No metallic foreign body. Nonobstructive bowel gas pattern.

## 2020-06-10 IMAGING — DX DG CHEST 1V
1 series · 1 of 1 positions shown · non-contrast
Comparison: None.

CLINICAL DATA: Altered mental status

EXAM:
CHEST  1 VIEW

[chest]
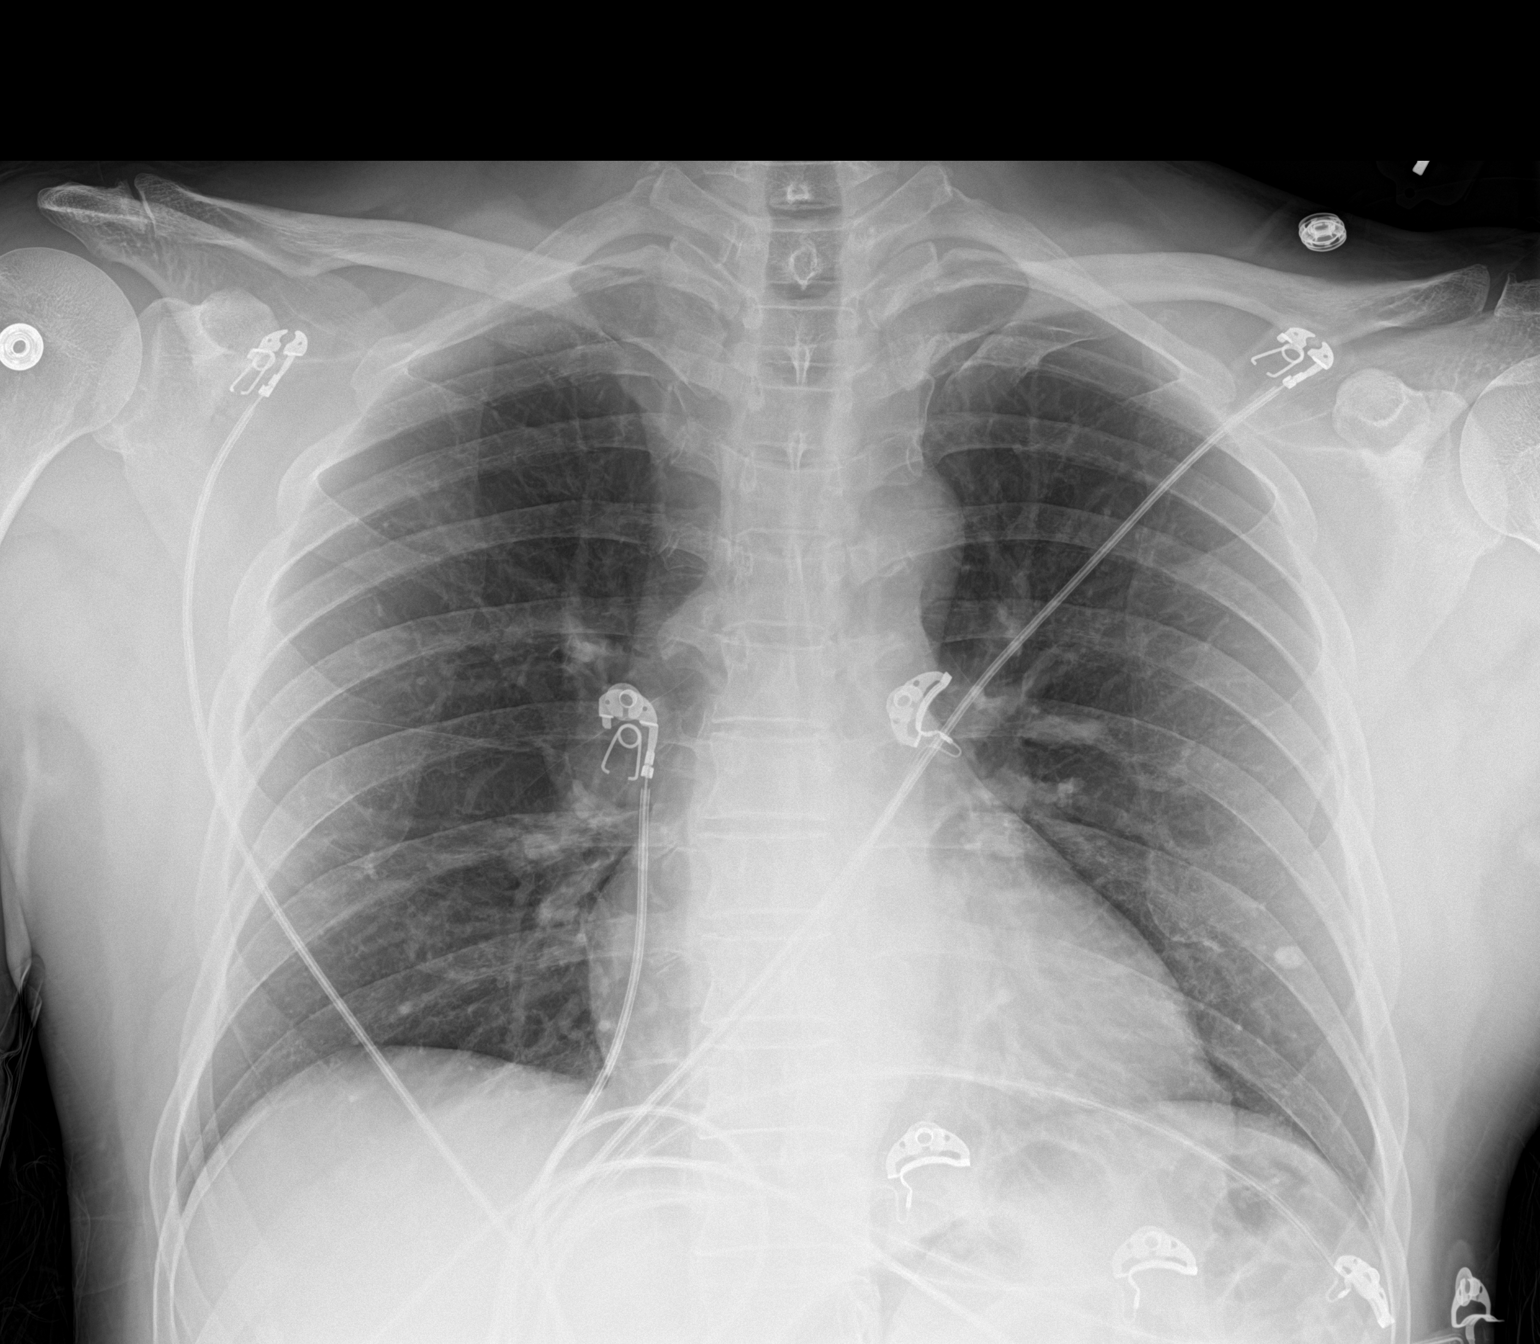

[1 of 1 positions shown; findings below may reference images not displayed]

FINDINGS: Benign calcified granuloma noted at the left lung base. Lungs are
otherwise clear. No pneumothorax or pleural effusion. Cardiac size
within normal limits. Pulmonary vascularity is normal. No acute bone
abnormality.
IMPRESSION: No active disease.

## 2020-06-10 MED ORDER — SODIUM CHLORIDE 0.9 % IV SOLN
200.0000 mg | INTRAVENOUS | Status: AC
  Administered 2020-06-10: 200 mg via INTRAVENOUS
  Filled 2020-06-10: qty 20

## 2020-06-10 MED ORDER — FUROSEMIDE 10 MG/ML IJ SOLN
20.0000 mg | Freq: Two times a day (BID) | INTRAMUSCULAR | Status: DC
  Administered 2020-06-10 – 2020-06-11 (×2): 20 mg via INTRAVENOUS
  Filled 2020-06-10 (×2): qty 4

## 2020-06-10 MED ORDER — GADOBUTROL 1 MMOL/ML IV SOLN
7.5000 mL | Freq: Once | INTRAVENOUS | Status: AC | PRN
  Administered 2020-06-10: 7.5 mL via INTRAVENOUS

## 2020-06-10 MED ORDER — DEMECLOCYCLINE HCL 150 MG PO TABS
300.0000 mg | ORAL_TABLET | Freq: Four times a day (QID) | ORAL | Status: DC
  Administered 2020-06-10: 300 mg via ORAL
  Filled 2020-06-10 (×4): qty 2

## 2020-06-10 MED ORDER — LORAZEPAM BOLUS VIA INFUSION: 2.0000 mg | Freq: Once | INTRAVENOUS | Status: DC

## 2020-06-10 MED ORDER — ACETAMINOPHEN 325 MG PO TABS: 650.0000 mg | ORAL_TABLET | Freq: Four times a day (QID) | ORAL | Status: DC | PRN

## 2020-06-10 MED ORDER — SODIUM CHLORIDE 0.9 % IV SOLN
20.0000 mg/kg | INTRAVENOUS | Status: AC
  Administered 2020-06-10: 1452 mg via INTRAVENOUS
  Filled 2020-06-10: qty 29.04

## 2020-06-10 MED ORDER — LEVETIRACETAM IN NACL 1500 MG/100ML IV SOLN
1500.0000 mg | Freq: Two times a day (BID) | INTRAVENOUS | Status: DC
  Administered 2020-06-10 – 2020-06-13 (×6): 1500 mg via INTRAVENOUS
  Filled 2020-06-10 (×6): qty 100

## 2020-06-10 MED ORDER — FUROSEMIDE 20 MG PO TABS: 20.0000 mg | ORAL_TABLET | Freq: Two times a day (BID) | ORAL | Status: DC

## 2020-06-10 MED ORDER — DEMECLOCYCLINE HCL 150 MG PO TABS
300.0000 mg | ORAL_TABLET | Freq: Four times a day (QID) | ORAL | Status: DC
  Administered 2020-06-11 – 2020-06-12 (×6): 300 mg via ORAL
  Filled 2020-06-10 (×8): qty 2

## 2020-06-10 MED ORDER — PHENYTOIN SODIUM 50 MG/ML IJ SOLN
100.0000 mg | Freq: Three times a day (TID) | INTRAMUSCULAR | Status: DC
  Administered 2020-06-10 – 2020-06-13 (×8): 100 mg via INTRAVENOUS
  Filled 2020-06-10 (×8): qty 2

## 2020-06-10 MED ORDER — IOHEXOL 9 MG/ML PO SOLN
500.0000 mL | ORAL | Status: AC
  Administered 2020-06-10 (×2): 500 mL via ORAL

## 2020-06-10 MED ORDER — LEVETIRACETAM IN NACL 1000 MG/100ML IV SOLN
1000.0000 mg | Freq: Once | INTRAVENOUS | Status: AC
  Administered 2020-06-10: 1000 mg via INTRAVENOUS
  Filled 2020-06-10: qty 100

## 2020-06-10 MED ORDER — ACETAMINOPHEN 650 MG RE SUPP: 650.0000 mg | Freq: Four times a day (QID) | RECTAL | Status: DC | PRN

## 2020-06-10 MED ORDER — LORAZEPAM 2 MG/ML IJ SOLN
2.0000 mg | Freq: Once | INTRAMUSCULAR | Status: AC
  Administered 2020-06-10: 2 mg via INTRAVENOUS
  Filled 2020-06-10: qty 1

## 2020-06-10 MED ORDER — SODIUM CHLORIDE 0.9 % IV SOLN: 2000.0000 mg | Freq: Once | INTRAVENOUS | Status: DC

## 2020-06-10 MED ORDER — SODIUM CHLORIDE 0.9 % IV SOLN
100.0000 mg | Freq: Two times a day (BID) | INTRAVENOUS | Status: DC
  Administered 2020-06-11: 100 mg via INTRAVENOUS
  Filled 2020-06-10 (×2): qty 10

## 2020-06-10 MED ORDER — IOHEXOL 300 MG/ML  SOLN
100.0000 mL | Freq: Once | INTRAMUSCULAR | Status: AC | PRN
  Administered 2020-06-10: 100 mL via INTRAVENOUS

## 2020-06-10 MED ORDER — DEMECLOCYCLINE HCL 150 MG PO TABS
300.0000 mg | ORAL_TABLET | Freq: Four times a day (QID) | ORAL | Status: DC
  Filled 2020-06-10 (×2): qty 2

## 2020-06-10 NOTE — ED Notes (Signed)
Lunch Tray Ordered @ 1009. 

## 2020-06-10 NOTE — ED Provider Notes (Addendum)
[facility] DEPARTMENT Provider Note   CSN: [id number] Arrival date & time: 06/09/20  1958     History Chief Complaint  Patient presents with  . Altered Mental Status    Hunter Terry is a 59 y.o. male.  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  Reportedly the patient had last been seen normal at 5:30 AM.  Somehow, during the course of that drive, the [employer] coordinator was able to see the patient was not making stops as scheduled.  They are able to do nurse evaluations electronically through the vehicles.  Patient was confused but not driving erratically.  He was repeatedly encouraged to bring the vehicle off of the road and stop.  He kept saying he would do so but did not.  Ultimately [employer] is able to disable the vehicle remotely.  EMS was sent to evaluate the patient and bring to the emergency department.  Patient is confused and cannot describe what happened over the course of the day.  He answers some simple questions correctly but is easily confused.  Further EMR review and ancillary obtained history is that the patient was seen in May for suspected seizure.  At that time a CT scan showed abnormality concerning for brain tumor.  Patient did have follow-up MRI shortly later again concerning findings suggesting need for further work-up.  Reportedly, patient's wife had been on the phone with him yesterday evening and was concerned that he may have had a seizure based on the sounds that she heard when they were on the phone.  Patient's presenting symptom and may when he had his first evaluation was for suspected seizure.  Patient is not on any known medications.    No past medical history on file.  Patient Active Problem List   Diagnosis Date Noted  . Brain mass 06/10/2020         Family History  Problem Relation Age of Onset  . Cancer Neg Hx     Social History   Tobacco Use  .  Smoking status: Never Smoker  . Smokeless tobacco: Never Used  Substance Use Topics  . Alcohol use: Never  . Drug use: Not on file    Home Medications Prior to Admission medications   Not on File    Allergies    Patient has no known allergies.  Review of Systems   Review of Systems Level 5 caveat cannot obtain review of systems due to patient confusion level. Physical Exam Updated Vital Signs BP 125/71   Pulse 72   Temp 99.1 F (37.3 C) (Oral)   Resp 19   Ht 5\' 5"  (1.651 m)   Wt 72.6 kg   SpO2 98%   BMI 26.63 kg/m   Physical Exam Constitutional:      Comments: Patient is alert.  He interacts pleasantly.  He seems confused.  No respiratory distress.  Well-nourished well-developed.  HENT:     Head: Normocephalic and atraumatic.     Nose: Nose normal.     Mouth/Throat:     Mouth: Mucous membranes are moist.     Pharynx: Oropharynx is clear.  Eyes:     Extraocular Movements: Extraocular movements intact.     Conjunctiva/sclera: Conjunctivae normal.     Pupils: Pupils are equal, round, and reactive to light.  Cardiovascular:     Rate and Rhythm: Normal rate and regular rhythm.  Pulmonary:     Effort: Pulmonary  effort is normal.     Breath sounds: Normal breath sounds.  Abdominal:     General: There is no distension.     Tenderness: There is no abdominal tenderness. There is no guarding.  Musculoskeletal:        General: No swelling or tenderness. Normal range of motion.     Cervical back: Neck supple.     Right lower leg: No edema.     Left lower leg: No edema.  Skin:    General: Skin is warm and dry.  Neurological:     Comments: Patient is awake and alert in appearance.  He follows simple commands.  His speech content however is confused.  He cannot name specific items such as a pen or a glove.  He cannot describe for whom he works or what he did today.  Patient will perform grip strength which is symmetric.  He can mimic my actions to hold his hands straight  out.  He does have slight tremor.  Patient can perform finger-nose exam with coaching on the right slightly better than the left.  Patient appears to have some slight tremor present in both upper extremities.     ED Results / Procedures / Treatments   Labs (all labs ordered are listed, but only abnormal results are displayed) Labs Reviewed  COMPREHENSIVE METABOLIC PANEL - Abnormal; Notable for the following components:      Result Value   Sodium 126 (*)    Chloride 90 (*)    Glucose, Bld 142 (*)    All other components within normal limits  URINALYSIS, ROUTINE W REFLEX MICROSCOPIC - Abnormal; Notable for the following components:   APPearance HAZY (*)    Glucose, UA 150 (*)    Ketones, ur 5 (*)    Protein, ur 30 (*)    All other components within normal limits  CBG MONITORING, ED - Abnormal; Notable for the following components:   Glucose-Capillary 129 (*)    All other components within normal limits  I-STAT CHEM 8, ED - Abnormal; Notable for the following components:   Sodium 126 (*)    Chloride 92 (*)    Creatinine, Ser 0.60 (*)    Glucose, Bld 138 (*)    Calcium, Ion 0.96 (*)    All other components within normal limits  RESPIRATORY PANEL BY RT PCR (FLU A&B, COVID)  ETHANOL  PROTIME-INR  APTT  CBC  DIFFERENTIAL  RAPID URINE DRUG SCREEN, HOSP PERFORMED    EKG EKG Interpretation  Date/Time:  Friday June 09 2020 21:47:01 EDT Ventricular Rate:  136 PR Interval:    QRS Duration: 91 QT Interval:  298 QTC Calculation: 449 R Axis:   81 Text Interpretation: Sinus tachycardia Minimal ST depression, diffuse leads agree. Confirmed by Charlesetta Shanks [phone]) on 06/10/2020 1:35:05 AM   Radiology DG Chest 1 View  Result Date: 06/10/2020 CLINICAL DATA:  Altered mental status EXAM: CHEST  1 VIEW COMPARISON:  None. FINDINGS: Benign calcified granuloma noted at the left lung base. Lungs are otherwise clear. No pneumothorax or pleural effusion. Cardiac size within normal  limits. Pulmonary vascularity is normal. No acute bone abnormality. IMPRESSION: No active disease. Electronically Signed   By: Fidela Salisbury MD   On: 06/10/2020 01:06   DG Abdomen 1 View  Result Date: 06/10/2020 CLINICAL DATA:  MRI clearance EXAM: ABDOMEN - 1 VIEW COMPARISON:  None. FINDINGS: The bowel gas pattern is normal. No radio-opaque calculi or other significant radiographic abnormality are seen. No metallic  foreign body. IMPRESSION: No metallic foreign body. Nonobstructive bowel gas pattern. Electronically Signed   By: Ulyses Jarred M.D.   On: 06/10/2020 01:07   CT HEAD WO CONTRAST  Addendum Date: 06/09/2020   ADDENDUM REPORT: 06/09/2020 22:24 ADDENDUM: These results were called by telephone at the time of interpretation on 06/09/2020 at 10:13 pm to provider Dr. Alvino Chapel, who verbally acknowledged these results. Electronically Signed   By: Primitivo Gauze M.D.   On: 06/09/2020 22:24   Result Date: 06/09/2020 CLINICAL DATA:  Mental status change, unknown cause. EXAM: CT HEAD WITHOUT CONTRAST TECHNIQUE: Contiguous axial images were obtained from the base of the skull through the vertex without intravenous contrast. COMPARISON:  12/18/2019 MRI/MRA head.  12/12/2019 head CT. FINDINGS: Brain: Redemonstration of infiltrative hypodensity involving the left temporal lobe. Partial effacement of the left lateral ventricle, increased when compared to prior MRI. Abutment of the left midbrain by the mesial left temporal lobe appears more conspicuous. New rightward midline shift of 4 mm. No ventriculomegaly or extra-axial fluid collection. No intracranial hemorrhage. Vascular: No hyperdense vessel or unexpected calcification. Skull: No acute finding. Sinuses/Orbits: Normal orbits. Clear paranasal sinuses. No mastoid effusion. Other: None. IMPRESSION: Redemonstration of infiltrative left temporal lesion. Increased partial effacement of the left lateral ventricle and abutment of the left midbrain. New  rightward midline shift of 4 mm. MRI head with and without contrast is recommended for better evaluation. Electronically Signed: By: Primitivo Gauze M.D. On: 06/09/2020 22:06    Procedures Procedures (including critical care time) CRITICAL CARE Performed by: Charlesetta Shanks   Total critical care time: 30 minutes  Critical care time was exclusive of separately billable procedures and treating other patients.  Critical care was necessary to treat or prevent imminent or life-threatening deterioration.  Critical care was time spent personally by me on the following activities: development of treatment plan with patient and/or surrogate as well as nursing, discussions with consultants, evaluation of patient's response to treatment, examination of patient, obtaining history from patient or surrogate, ordering and performing treatments and interventions, ordering and review of laboratory studies, ordering and review of radiographic studies, pulse oximetry and re-evaluation of patient's condition.Medications Ordered in ED Medications  LORazepam (ATIVAN) injection 1 mg (has no administration in time range)    ED Course  I have reviewed the triage vital signs and the nursing notes.  Pertinent labs & imaging results that were available during my care of the patient were reviewed by me and considered in my medical decision making (see chart for details).  Clinical Course as of Jun 10 137  Sat Jun 10, 2020  0036 Consult:admit Dr. Hal Hope for admission   [MP]    Clinical Course User Index [MP] Charlesetta Shanks, MD   MDM Rules/Calculators/A&P                          Consult: Patient's case reviewed with neurosurgery.  Neurosurgery advises for admission to hospitalist service for metastatic work-up.  Also request patient get with and without contrast MRI.  They will continue to follow along to manage brain tumor.  Patient presents as outlined.  At this time it appears most likely all  symptoms are due to a brain tumor with mass-effect present.  Patient is nontoxic and afebrile.  He is alert albeit confused.  Initial consideration was for CVA subacute or acute.  However, further review of EMR indicates patient had prior MRI with concern for neoplasm.  Today CT shows 4  mm midline shift.  Patient's airway is stable.  He is alert and follows commands but has very poor recall and cannot name objects.  He can perform motor commands for upper and lower strength testing.  Patient does not appear toxic in appearance.  Will require admission for ongoing diagnostic evaluation and management of increased intracranial pressure with probable neoplasm. Final Clinical Impression(s) / ED Diagnoses Final diagnoses:  Disorientation  Brain tumor [facility])    Rx / [place] Orders ED Discharge Orders    None       Charlesetta Shanks, MD 06/10/20 [id number]    Charlesetta Shanks, MD 06/10/20 0139

## 2020-06-10 NOTE — Progress Notes (Signed)
Patient received to room 3W18.  Is very confused and got off stretcher to find bathroom.  Was attempting to urinate on nearest wall when he was intercepted by three nurses and assisted back to bed.  Gait very unsteady / ataxic.  Patient having lip smacking movements and is unaware of his surroundings.  Speaking only Spanish, but is not comprehending or following Spanish-spoken commands.  By history, he can communicate in [place], but is now unable to .Required restraining for safety.

## 2020-06-10 NOTE — Consult Note (Addendum)
NEURO HOSPITALIST CONSULT NOTE   Requesting physician: Dr. Wynelle Cleveland  Reason for Consult: AMS, Seizure  History obtained from:  Chart, wife  HPI:                                                                                                                                          Veer Elamin is an 59 y.o. male with a past medical history significant for seizure, brain mass who presents today with altered mental status. Mr. 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 [employer] disabled the truck remotely and Mr. Sagan was brought to [facility] by EMS.  In May, 2021, Mr. Tritschler presented to  [facility] following seizure after being found down, incontinent with a swollen and bruised tongue. At that time, a MRI was performed and an abnormal, infiltrative and mass-like hyperintensity was identified involving the left temporal lobe. Mr. Denning was told not to drive again until cleared by a neurologist, but he did not follow-up because he was feeling well.  I spoke with his wife Tresa Endo, [phone], [place] interpreter needed) who notes that Mr. Ogan "always" complains about pain in his neck, but it is not bad enough for him to see a physician. She states that over the last five days he has been experiencing headaches and nightmares. She states that she was on the phone with him night before last and he experienced a seizure where he lost consciousness for approximately 30 minutes. The next day, he drove back to [place]. She states he does not take medications but does take "natural remedies" like a "focus powder" and another for an obstruction of oxygen to the brain.   In the ED today (30Oct2021), a routine EEG was performed and Mr. Pitney experienced three distinct seizures during the 20 minute exam, all arising from the left hemisphere. On my visit, Mr. Pauwels was pleasant,  slightly confused and unable to answer many questions.   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  History reviewed. No pertinent past medical history.  History reviewed. No pertinent surgical history.  Family History  Problem Relation Age of Onset  . Cancer Neg Hx     Social History:  reports that he has never smoked. He has never used smokeless tobacco. He reports that he does not drink alcohol. No history on file for drug use.  No Known Allergies  MEDICATIONS:  No outpatient medications have been marked as taking for the 06/09/20 encounter [facility] Encounter).     Review Of Systems:                                                                                                           History obtained from unobtainable from patient due to mental status  Blood pressure 119/70, pulse 90, temperature 99.1 F (37.3 C), temperature source Oral, resp. rate 14, height 5\' 5"  (1.651 m), weight 72.6 kg, SpO2 100 %.   Physical Examination:                                                                                                      General: WDWN male. Appears calm and comfortable in bed HEENT:  Normocephalic, no lesions, without obvious abnormality.  Normal external eye and conjunctiva.  Normal external ears and nose, mucus membranes and septum.  Normal pharynx. Cardiovascular: RRR Pulmonary: Breathing comfortably on room air Abdomen: Soft Musculoskeletal: no joint tenderness, deformity or swelling; Tone and bulk normal throughout; no atrophy noted Skin: warm and dry, no hyperpigmentation, vitiligo, or suspicious lesions  Neurological  Examination:                                                                                               Mental Status: Kieon Lawhorn is alert, oriented only to self, intermittently aphasic. He is able to follow simple commands. Cranial Nerves: II: Visual fields grossly normal, pupils are equal, round, reactive to light. III,IV, VI: Ptosis not present, extra-ocular muscle movements intact bilaterally V,VII: Smile and eyebrow raise is symmetric. Facial light touch sensation intact bilaterally VIII: Hearing grossly intact IX,X: Uvula and palate rise symmetrically XI: SCM and bilateral shoulder shrug strength 5/5 XII: Midline tongue extension Motor: Right :     Upper extremity   5/5   Left:     Upper extremity   5/5          Lower extremity   5/5     Lower extremity   5/5 Pronator drift not present Sensory: Light touch intact throughout, bilaterally Deep Tendon Reflexes: 2+ and symmetric throughout Cerebellar: Finger-to-nose test is slow but without evidence of dysmetria or ataxia. Gait: Not tested   Lab Results:  Basic Metabolic Panel: Recent Labs  Lab 06/09/20 2055 06/09/20 2111 06/10/20 0309 06/10/20 0727  NA 126* 126* 125* 122*  K 3.8 3.9 3.6 3.5  CL 90* 92* 92* 88*  CO2 25  --  24 25  GLUCOSE 142* 138* 121* 122*  BUN 12 13 9 8   CREATININE 0.78 0.60* 0.70 0.70  CALCIUM 8.9  --  8.3* 8.4*    Liver Function Tests: Recent Labs  Lab 06/09/20 2055  AST 30  ALT 23  ALKPHOS 55  BILITOT 1.0  PROT 7.7  ALBUMIN 4.5   No results for input(s): LIPASE, AMYLASE in the last 168 hours. No results for input(s): AMMONIA in the last 168 hours.  CBC: Recent Labs  Lab 06/09/20 2055 06/09/20 2111 06/10/20 0309  WBC 9.1  --  9.6  NEUTROABS 7.7  --  7.5  HGB 14.9 14.6 13.1  HCT 43.6 43.0 38.2*  MCV 82.1  --  83.4  PLT 307  --  283    Cardiac Enzymes: No results for input(s): CKTOTAL, CKMB, CKMBINDEX, TROPONINI in the last 168 hours.  Lipid Panel: No results for  input(s): CHOL, TRIG, HDL, CHOLHDL, VLDL, LDLCALC in the last 168 hours.  CBG: Recent Labs  Lab 06/09/20 2019  GLUCAP 129*    Microbiology: Results for orders placed or performed during the hospital encounter of 06/09/20  Respiratory Panel by RT PCR (Flu A&B, Covid) - Nasopharyngeal Swab     Status: None   Collection Time: 06/09/20  8:55 PM   Specimen: Nasopharyngeal Swab  Result Value Ref Range Status   SARS Coronavirus 2 by RT PCR NEGATIVE NEGATIVE Final    Comment: (NOTE) SARS-CoV-2 target nucleic acids are NOT DETECTED.  The SARS-CoV-2 RNA is generally detectable in upper respiratoy specimens during the acute phase of infection. The lowest concentration of SARS-CoV-2 viral copies this assay can detect is 131 copies/mL. A negative result does not preclude SARS-Cov-2 infection and should not be used as the sole basis for treatment or other patient management decisions. A negative result may occur with  improper specimen collection/handling, submission of specimen other than nasopharyngeal swab, presence of viral mutation(s) within the areas targeted by this assay, and inadequate number of viral copies (<131 copies/mL). A negative result must be combined with clinical observations, patient history, and epidemiological information. The expected result is Negative.  Fact Sheet for Patients:  PinkCheek.be  Fact Sheet for Healthcare Providers:  GravelBags.it  This test is no t yet approved or cleared by the [place] FDA and  has been authorized for detection and/or diagnosis of SARS-CoV-2 by FDA under an Emergency Use Authorization (EUA). This EUA will remain  in effect (meaning this test can be used) for the duration of the COVID-19 declaration under Section 564(b)(1) of the Act, 21 U.S.C. section 360bbb-3(b)(1), unless the authorization is terminated or revoked sooner.     Influenza A by PCR NEGATIVE NEGATIVE  Final   Influenza B by PCR NEGATIVE NEGATIVE Final    Comment: (NOTE) The Xpert Xpress SARS-CoV-2/FLU/RSV assay is intended as an aid in  the diagnosis of influenza from Nasopharyngeal swab specimens and  should not be used as a sole basis for treatment. Nasal washings and  aspirates are unacceptable for Xpert Xpress SARS-CoV-2/FLU/RSV  testing.  Fact Sheet for Patients: PinkCheek.be  Fact Sheet for Healthcare Providers: GravelBags.it  This test is not yet approved or cleared by the [place] FDA and  has been authorized for detection and/or diagnosis of SARS-CoV-2  by  FDA under an Emergency Use Authorization (EUA). This EUA will remain  in effect (meaning this test can be used) for the duration of the  Covid-19 declaration under Section 564(b)(1) of the Act, 21  U.S.C. section 360bbb-3(b)(1), unless the authorization is  terminated or revoked. Performed at [facility], [place] [address], [place], [place] [address]     Coagulation Studies: Recent Labs    06/09/20 11/03/53  LABPROT 12.6  INR 1.0    Imaging: EEG  Result Date: 06/10/2020 Greta Doom, MD     06/10/2020 10:25 AM History: 59 year old male with no mass presented with altered mental status Sedation: None Technique: This is a 21 channel routine scalp EEG performed at the bedside with bipolar and monopolar montages arranged in accordance to the international 10/20 system of electrode placement. One channel was dedicated to EKG recording. Background: The background consists of intermixed alpha and beta activities. There is a well defined posterior dominant rhythm of 9 hz that attenuates with eye opening.  There is focal left frontotemporal slowing.  He had three seizures lasting 2:07, 2:42 and 1:27 with poorly localized left hemispheric onset. No definite clinical correlate was seen, though with the first there is possibly some liip smacking, but not  clear on video due to darkening for photic stimulation. Photic stimulation and HV were performed. EEG Abnormalities: 1) Three focal left hemispheric seizures. As above. 2) Left frontotemporal seizures Clinical Interpretation: This EEG recorded three discrete focal seizures arising from the left hemisphere with no definite clinical correlate. Roland Rack, MD [facility] [phone] If 7pm- 7am, please page neurology on call as listed in [place].   DG Chest 1 View  Result Date: 06/10/2020 CLINICAL DATA:  Altered mental status EXAM: CHEST  1 VIEW COMPARISON:  None. FINDINGS: Benign calcified granuloma noted at the left lung base. Lungs are otherwise clear. No pneumothorax or pleural effusion. Cardiac size within normal limits. Pulmonary vascularity is normal. No acute bone abnormality. IMPRESSION: No active disease. Electronically Signed   By: Fidela Salisbury MD   On: 06/10/2020 01:06   DG Abdomen 1 View  Result Date: 06/10/2020 CLINICAL DATA:  MRI clearance EXAM: ABDOMEN - 1 VIEW COMPARISON:  None. FINDINGS: The bowel gas pattern is normal. No radio-opaque calculi or other significant radiographic abnormality are seen. No metallic foreign body. IMPRESSION: No metallic foreign body. Nonobstructive bowel gas pattern. Electronically Signed   By: Ulyses Jarred M.D.   On: 06/10/2020 01:07   CT HEAD WO CONTRAST  Addendum Date: 06/09/2020   ADDENDUM REPORT: 06/09/2020 22:24 ADDENDUM: These results were called by telephone at the time of interpretation on 06/09/2020 at 10:13 pm to provider Dr. Alvino Chapel, who verbally acknowledged these results. Electronically Signed   By: Primitivo Gauze M.D.   On: 06/09/2020 22:24   Result Date: 06/09/2020 CLINICAL DATA:  Mental status change, unknown cause. EXAM: CT HEAD WITHOUT CONTRAST TECHNIQUE: Contiguous axial images were obtained from the base of the skull through the vertex without intravenous contrast. COMPARISON:  12/18/2019 MRI/MRA head.   12/12/2019 head CT. FINDINGS: Brain: Redemonstration of infiltrative hypodensity involving the left temporal lobe. Partial effacement of the left lateral ventricle, increased when compared to prior MRI. Abutment of the left midbrain by the mesial left temporal lobe appears more conspicuous. New rightward midline shift of 4 mm. No ventriculomegaly or extra-axial fluid collection. No intracranial hemorrhage. Vascular: No hyperdense vessel or unexpected calcification. Skull: No acute finding. Sinuses/Orbits: Normal orbits. Clear paranasal sinuses. No mastoid  effusion. Other: None. IMPRESSION: Redemonstration of infiltrative left temporal lesion. Increased partial effacement of the left lateral ventricle and abutment of the left midbrain. New rightward midline shift of 4 mm. MRI head with and without contrast is recommended for better evaluation. Electronically Signed: By: Primitivo Gauze M.D. On: 06/09/2020 22:06   CT CHEST W CONTRAST  Result Date: 06/10/2020 CLINICAL DATA:  Brain/CNS neoplasm. Evaluate for metastatic disease/primary neoplasm. EXAM: CT CHEST, ABDOMEN, AND PELVIS WITH CONTRAST TECHNIQUE: Multidetector CT imaging of the chest, abdomen and pelvis was performed following the standard protocol during bolus administration of intravenous contrast. CONTRAST:  182mL OMNIPAQUE IOHEXOL 300 MG/ML  SOLN COMPARISON:  Brain MRI, 06/10/2020. Abdomen radiographs, 06/10/2020. FINDINGS: CT CHEST FINDINGS Cardiovascular: Heart normal in size and configuration. No pericardial effusion. No coronary artery calcifications. Normal great vessels and widely patent aortic arch branch vessels. Mediastinum/Nodes: Normal thyroid. No neck base, axillary, mediastinal or hilar masses or enlarged lymph nodes. Normal trachea and esophagus. Lungs/Pleura: Minor dependent atelectasis. Calcified granuloma in the left upper lobe lingula. No evidence of pneumonia or pulmonary edema. No lung mass or suspicious nodule. No pleural  effusion or pneumothorax. Musculoskeletal: No fracture or bone lesion. No significant skeletal abnormality. No chest wall mass. CT ABDOMEN PELVIS FINDINGS Hepatobiliary: No focal liver abnormality is seen. No gallstones, gallbladder wall thickening, or biliary dilatation. Pancreas: Unremarkable. No pancreatic ductal dilatation or surrounding inflammatory changes. Spleen: Normal in size without focal abnormality. Adrenals/Urinary Tract: Normal adrenal glands. Kidneys normal in size, orientation and position with symmetric enhancement and excretion. No renal masses. No hydronephrosis. Normal ureters. Bladder unremarkable. Stomach/Bowel: Stomach is within normal limits. Appendix appears normal. No evidence of bowel wall thickening, distention, or inflammatory changes. Vascular/Lymphatic: No significant vascular findings are present. No enlarged abdominal or pelvic lymph nodes. Reproductive: Prostate mildly enlarged, 4.8 x 3.5 cm transversely. Other: No abdominal wall hernia or abnormality. No abdominopelvic ascites. Musculoskeletal: Chronic bilateral pars defects at L5-S1. Minimal anterolisthesis. Skeletal structures otherwise unremarkable. IMPRESSION: 1. No evidence of a primary malignancy or metastatic disease within the chest, abdomen or pelvis. 2. No acute findings within the chest, abdomen or pelvis. Electronically Signed   By: Lajean Manes M.D.   On: 06/10/2020 09:52   MR Brain W and Wo Contrast  Result Date: 06/10/2020 CLINICAL DATA:  Brain mass EXAM: MRI HEAD WITHOUT AND WITH CONTRAST TECHNIQUE: Multiplanar, multiecho pulse sequences of the brain and surrounding structures were obtained without and with intravenous contrast. CONTRAST:  7.25mL GADAVIST GADOBUTROL 1 MMOL/ML IV SOLN COMPARISON:  Brain MRI 12/18/2019 FINDINGS: Brain: No acute infarct, acute hemorrhage or extra-axial collection. Slight worsening of abnormal hyperintense T2-weighted signal within the left temporal lobe. Mass effect on the left  lateral ventricle and brainstem is slightly increased. There is no contrast enhancement within the lesion. No chronic microhemorrhage. Normal midline structures. Vascular: Normal flow voids. Skull and upper cervical spine: Normal marrow signal. Sinuses/Orbits: Negative. Other: None. 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 Electronically Signed   By: Ulyses Jarred M.D.   On: 06/10/2020 03:12   CT ABDOMEN PELVIS W CONTRAST  Result Date: 06/10/2020 CLINICAL DATA:  Brain/CNS neoplasm. Evaluate for metastatic disease/primary neoplasm. EXAM: CT CHEST, ABDOMEN, AND PELVIS WITH CONTRAST TECHNIQUE: Multidetector CT imaging of the chest, abdomen and pelvis was performed following the standard protocol during bolus administration of intravenous contrast. CONTRAST:  158mL OMNIPAQUE IOHEXOL 300 MG/ML  SOLN COMPARISON:  Brain MRI, 06/10/2020. Abdomen radiographs, 06/10/2020. FINDINGS: CT CHEST FINDINGS Cardiovascular: Heart normal in size and configuration. No pericardial effusion. No coronary artery calcifications. Normal great vessels and widely patent aortic arch branch vessels. Mediastinum/Nodes: Normal thyroid. No neck base, axillary, mediastinal or hilar masses or enlarged lymph nodes. Normal trachea and esophagus. Lungs/Pleura: Minor dependent atelectasis. Calcified granuloma in the left upper lobe lingula. No evidence of pneumonia or pulmonary edema. No lung mass or suspicious nodule. No pleural effusion or pneumothorax. Musculoskeletal: No fracture or bone lesion. No significant skeletal abnormality. No chest wall mass. CT ABDOMEN PELVIS FINDINGS Hepatobiliary: No focal liver abnormality is seen. No gallstones, gallbladder wall thickening, or biliary dilatation. Pancreas: Unremarkable. No pancreatic ductal dilatation or surrounding inflammatory changes. Spleen: Normal in size without focal  abnormality. Adrenals/Urinary Tract: Normal adrenal glands. Kidneys normal in size, orientation and position with symmetric enhancement and excretion. No renal masses. No hydronephrosis. Normal ureters. Bladder unremarkable. Stomach/Bowel: Stomach is within normal limits. Appendix appears normal. No evidence of bowel wall thickening, distention, or inflammatory changes. Vascular/Lymphatic: No significant vascular findings are present. No enlarged abdominal or pelvic lymph nodes. Reproductive: Prostate mildly enlarged, 4.8 x 3.5 cm transversely. Other: No abdominal wall hernia or abnormality. No abdominopelvic ascites. Musculoskeletal: Chronic bilateral pars defects at L5-S1. Minimal anterolisthesis. Skeletal structures otherwise unremarkable. IMPRESSION: 1. No evidence of a primary malignancy or metastatic disease within the chest, abdomen or pelvis. 2. No acute findings within the chest, abdomen or pelvis. Electronically Signed   By: Lajean Manes M.D.   On: 06/10/2020 09:52    Assessment and Plan: Mr. Fiveash is a 59 year old male with PMH significant for seizure and worsening brain mass. It is likely that the mass is the seizure focus. Neurosurgery has been consulted. Mr. Luger was will remain in the hospital for overnight seizure observation and medication refinement.   Next steps: cEEG - ordered 2g Keppra load - given 1500mg  Keppra BID - scheduled    Seizure medications will be refined based on cEEG findings  Thank you for consulting the [facility] team. Assessment and plan per attending neurologist.   Solon Augusta PA-C [facility]  06/10/2020, 11:11 AM  I have seen the patient reviewed the above note.   I do think there may be some lipsmacking and reduced verbal output as semiology as he had an episode of this while I was in the room, but given language barrier this was difficult to judge.  He has been started on Keppra given Ativan, and continues to have frequent  seizures.  I will load with fosphenytoin and continue keppra at 1.5g BID.    Roland Rack, MD [facility] [phone]  If 7pm- 7am, please page neurology on call as listed in [place].

## 2020-06-10 NOTE — Progress Notes (Signed)
PROGRESS NOTE    Hunter Terry   CZY:[id number]  DOB: [birth year]  DOA: 06/09/2020 PCP: Patient, No Pcp Per   Brief Narrative:  Hunter Terry  is a 59 y.o. male with with history of having been diagnosed with possible seizures in May 2021 at that time MRI brain done which showed possible left temporal infiltrative process for which patient did not seek any further medical advice is a truck driver for [employer] and was driving back from [place] to [place] when patient was found to be increasingly confused by patient's colleagues at workplace and since truck has a camera on the [employer] nurse was trying to communicate with the patient was found to be not following the commands and remotely the truck was stopped as per the report.  Patient was brought to the ER.  Per patient's wife patient waited talking to her last night had about half hour appeared confused and talking nonsensical things before becoming normal.  6 months ago patient had waking up from sleep with incontinence of urine and tongue bite at that time there was some concern for seizure since patient's grandfather also had similar symptoms with seizures.  That prompted patient to be taken to the ER had an MRI done which showed left temporal lesion for which further medical advice was not sought.  In ED > CT of the head shows redemonstration of the infiltrative left temporal lesion increased partial effacement of the lateral ventricle and abutment of the left midbrain with midline shift of 4 mm.  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 If due to mass vs due to postictal state from seizures in addition to hyponatremia  Time spent in minutes: 35 DVT prophylaxis: SCDs Start: 06/10/20 0309  Code Status: Full code Family Communication:  Disposition Plan:  Status is: Inpatient  Remains inpatient appropriate because:brain mass with confusion, seizures and hyponatremia   Dispo: The patient is from: Home              Anticipated d/c is to: TBD              Anticipated d/c date is: > 3 days              Patient currently is not medically stable to d/c.      Consultants:   NS  Neurology  Renal Procedures:   EEG Antimicrobials:  Anti-infectives (From admission, onward)   Start     Dose/Rate Route Frequency Ordered Stop   06/10/20 1200  demeclocycline (DECLOMYCIN) tablet 300 mg        300 mg Oral Every 6 hours 06/10/20 1018         Objective: Vitals:   06/10/20 1100 06/10/20 1130 06/10/20 1215 06/10/20 1645  BP: 119/70 117/76 (!) 120/95 105/63  Pulse:  66 82   Resp:  14 17 (!) 26 14  Temp:    97.9 F (36.6 C)  TempSrc:    Axillary  SpO2:  97% 98% 98%  Weight:      Height:       No intake or output data in the 24 hours ending 06/10/20 1723 Filed Weights   06/09/20 2013  Weight: 72.6 kg    Examination: General exam: Appears comfortable- sedated  HEENT: PERRL Respiratory system: Clear to auscultation. Respiratory effort normal. Cardiovascular system: S1 & S2 heard, RRR.   Gastrointestinal system: Abdomen soft, non-tender, nondistended. Normal bowel sounds. Central nervous system: sedated, extremities flaccid Extremities: No cyanosis, clubbing or edema Skin: No rashes or ulcers Psychiatry:  Cannot assess    Data Reviewed: I have  personally reviewed following labs and imaging studies  CBC: Recent Labs  Lab 06/09/20 2055 06/09/20 2111 06/10/20 0309  WBC 9.1  --  9.6  NEUTROABS 7.7  --  7.5  HGB 14.9 14.6 13.1  HCT 43.6 43.0 38.2*  MCV 82.1  --  83.4  PLT 307  --  222   Basic Metabolic Panel: Recent Labs  Lab 06/09/20 2055 06/09/20 2111 06/10/20 0309 06/10/20 0727 06/10/20 1337  NA 126* 126* 125* 122* 125*  K 3.8 3.9 3.6 3.5 4.0  CL 90* 92* 92* 88* 90*  CO2 25  --  24 25 27   GLUCOSE 142* 138* 121* 122* 118*  BUN 12 13 9 8 8   CREATININE 0.78 0.60* 0.70 0.70 0.71  CALCIUM 8.9  --  8.3* 8.4* 8.6*   GFR: Estimated Creatinine Clearance: 86.5 mL/min (by C-G formula based on SCr of 0.71 mg/dL). Liver Function Tests: Recent Labs  Lab 06/09/20 2055  AST 30  ALT 23  ALKPHOS 55  BILITOT 1.0  PROT 7.7  ALBUMIN 4.5   No results for input(s): LIPASE, AMYLASE in the last 168 hours. No results for input(s): AMMONIA in the last 168 hours. Coagulation Profile: Recent Labs  Lab 06/09/20 2055  INR 1.0   Cardiac Enzymes: No results for input(s): CKTOTAL, CKMB, CKMBINDEX, TROPONINI in the last 168 hours. BNP (last 3 results) No results for input(s): PROBNP in the last 8760 hours. HbA1C: No results for input(s): HGBA1C in the last 72 hours. CBG: Recent Labs  Lab 06/09/20 2019  GLUCAP 129*   Lipid Profile: No results for input(s): CHOL, HDL, LDLCALC, TRIG, CHOLHDL, LDLDIRECT in the last 72 hours. Thyroid Function Tests: Recent Labs    06/10/20 0309  TSH 1.578   Anemia Panel: No results for input(s): VITAMINB12, FOLATE, FERRITIN, TIBC, IRON, RETICCTPCT in the last 72 hours. Urine analysis:    Component Value Date/Time   COLORURINE YELLOW 06/09/2020 2055   APPEARANCEUR HAZY (A) 06/09/2020 2055   LABSPEC 1.028 06/09/2020 2055   PHURINE 5.0 06/09/2020 2055   GLUCOSEU 150 (A) 06/09/2020 2055   HGBUR NEGATIVE 06/09/2020 2055   BILIRUBINUR NEGATIVE 06/09/2020 2055   KETONESUR 5 (A)  06/09/2020 2055   PROTEINUR 30 (A) 06/09/2020 2055   NITRITE NEGATIVE 06/09/2020 2055   LEUKOCYTESUR NEGATIVE 06/09/2020 2055   Sepsis Labs: @LABRCNTIP (procalcitonin:4,lacticidven:4) ) Recent Results (from the past 240 hour(s))  Respiratory Panel by RT PCR (Flu A&B, Covid) - Nasopharyngeal Swab     Status: None   Collection Time: 06/09/20  8:55 PM   Specimen: Nasopharyngeal Swab  Result Value Ref Range Status   SARS Coronavirus 2 by RT PCR NEGATIVE NEGATIVE Final    Comment: (NOTE) SARS-CoV-2 target nucleic acids are NOT DETECTED.  The  SARS-CoV-2 RNA is generally detectable in upper respiratoy specimens during the acute phase of infection. The lowest concentration of SARS-CoV-2 viral copies this assay can detect is 131 copies/mL. A negative result does not preclude SARS-Cov-2 infection and should not be used as the sole basis for treatment or other patient management decisions. A negative result may occur with  improper specimen collection/handling, submission of specimen other than nasopharyngeal swab, presence of viral mutation(s) within the areas targeted by this assay, and inadequate number of viral copies (<131 copies/mL). A negative result must be combined with clinical observations, patient history, and epidemiological information. The expected result is Negative.  Fact Sheet for Patients:  PinkCheek.be  Fact Sheet for Healthcare Providers:  GravelBags.it  This test is no t yet approved or cleared by the [place] FDA and  has been authorized for detection and/or diagnosis of SARS-CoV-2 by FDA under an Emergency Use Authorization (EUA). This EUA will remain  in effect (meaning this test can be used) for the duration of the COVID-19 declaration under Section 564(b)(1) of the Act, 21 U.S.C. section 360bbb-3(b)(1), unless the authorization is terminated or revoked sooner.     Influenza A by PCR NEGATIVE  NEGATIVE Final   Influenza B by PCR NEGATIVE NEGATIVE Final    Comment: (NOTE) The Xpert Xpress SARS-CoV-2/FLU/RSV assay is intended as an aid in  the diagnosis of influenza from Nasopharyngeal swab specimens and  should not be used as a sole basis for treatment. Nasal washings and  aspirates are unacceptable for Xpert Xpress SARS-CoV-2/FLU/RSV  testing.  Fact Sheet for Patients: PinkCheek.be  Fact Sheet for Healthcare Providers: GravelBags.it  This test is not yet approved or cleared by the [place] FDA and  has been authorized for detection and/or diagnosis of SARS-CoV-2 by  FDA under an Emergency Use Authorization (EUA). This EUA will remain  in effect (meaning this test can be used) for the duration of the  Covid-19 declaration under Section 564(b)(1) of the Act, 21  U.S.C. section 360bbb-3(b)(1), unless the authorization is  terminated or revoked. Performed at [facility], [place] [address] [place], [place] [address]          Radiology Studies: EEG  Result Date: 06/10/2020 Greta Doom, MD     06/10/2020 10:25 AM History: 59 year old male with no mass presented with altered mental status Sedation: None Technique: This is a 21 channel routine scalp EEG performed at the bedside with bipolar and monopolar montages arranged in accordance to the international 10/20 system of electrode placement. One channel was dedicated to EKG recording. Background: The background consists of intermixed alpha and beta activities. There is a well defined posterior dominant rhythm of 9 hz that attenuates with eye opening.  There is focal left frontotemporal slowing.  He had three seizures lasting 2:07, 2:42 and 1:27 with poorly localized left hemispheric onset. No definite clinical correlate was seen, though with the first there is possibly some liip smacking, but not clear on video due to darkening for photic stimulation.  Photic stimulation and HV were performed. EEG Abnormalities: 1) Three focal left hemispheric seizures. As above. 2) Left frontotemporal seizures Clinical Interpretation: This EEG recorded three discrete focal seizures arising from the left hemisphere with no definite clinical correlate. Roland Rack, MD [facility] [phone] If 7pm- 7am, please page neurology on call as listed in [place].   DG Chest 1 View  Result Date: 06/10/2020 CLINICAL DATA:  Altered mental status EXAM: CHEST  1 VIEW COMPARISON:  None.  FINDINGS: Benign calcified granuloma noted at the left lung base. Lungs are otherwise clear. No pneumothorax or pleural effusion. Cardiac size within normal limits. Pulmonary vascularity is normal. No acute bone abnormality. IMPRESSION: No active disease. Electronically Signed   By: Fidela Salisbury MD   On: 06/10/2020 01:06   DG Abdomen 1 View  Result Date: 06/10/2020 CLINICAL DATA:  MRI clearance EXAM: ABDOMEN - 1 VIEW COMPARISON:  None. FINDINGS: The bowel gas pattern is normal. No radio-opaque calculi or other significant radiographic abnormality are seen. No metallic foreign body. IMPRESSION: No metallic foreign body. Nonobstructive bowel gas pattern. Electronically Signed   By: Ulyses Jarred M.D.   On: 06/10/2020 01:07   CT HEAD WO CONTRAST  Addendum Date: 06/09/2020   ADDENDUM REPORT: 06/09/2020 22:24 ADDENDUM: These results were called by telephone at the time of interpretation on 06/09/2020 at 10:13 pm to provider Dr. Alvino Chapel, who verbally acknowledged these results. Electronically Signed   By: Primitivo Gauze M.D.   On: 06/09/2020 22:24   Result Date: 06/09/2020 CLINICAL DATA:  Mental status change, unknown cause. EXAM: CT HEAD WITHOUT CONTRAST TECHNIQUE: Contiguous axial images were obtained from the base of the skull through the vertex without intravenous contrast. COMPARISON:  12/18/2019 MRI/MRA head.  12/12/2019 head CT. FINDINGS: Brain: Redemonstration of  infiltrative hypodensity involving the left temporal lobe. Partial effacement of the left lateral ventricle, increased when compared to prior MRI. Abutment of the left midbrain by the mesial left temporal lobe appears more conspicuous. New rightward midline shift of 4 mm. No ventriculomegaly or extra-axial fluid collection. No intracranial hemorrhage. Vascular: No hyperdense vessel or unexpected calcification. Skull: No acute finding. Sinuses/Orbits: Normal orbits. Clear paranasal sinuses. No mastoid effusion. Other: None. IMPRESSION: Redemonstration of infiltrative left temporal lesion. Increased partial effacement of the left lateral ventricle and abutment of the left midbrain. New rightward midline shift of 4 mm. MRI head with and without contrast is recommended for better evaluation. Electronically Signed: By: Primitivo Gauze M.D. On: 06/09/2020 22:06   CT CHEST W CONTRAST  Result Date: 06/10/2020 CLINICAL DATA:  Brain/CNS neoplasm. Evaluate for metastatic disease/primary neoplasm. EXAM: CT CHEST, ABDOMEN, AND PELVIS WITH CONTRAST TECHNIQUE: Multidetector CT imaging of the chest, abdomen and pelvis was performed following the standard protocol during bolus administration of intravenous contrast. CONTRAST:  156mL OMNIPAQUE IOHEXOL 300 MG/ML  SOLN COMPARISON:  Brain MRI, 06/10/2020. Abdomen radiographs, 06/10/2020. FINDINGS: CT CHEST FINDINGS Cardiovascular: Heart normal in size and configuration. No pericardial effusion. No coronary artery calcifications. Normal great vessels and widely patent aortic arch branch vessels. Mediastinum/Nodes: Normal thyroid. No neck base, axillary, mediastinal or hilar masses or enlarged lymph nodes. Normal trachea and esophagus. Lungs/Pleura: Minor dependent atelectasis. Calcified granuloma in the left upper lobe lingula. No evidence of pneumonia or pulmonary edema. No lung mass or suspicious nodule. No pleural effusion or pneumothorax. Musculoskeletal: No fracture or bone  lesion. No significant skeletal abnormality. No chest wall mass. CT ABDOMEN PELVIS FINDINGS Hepatobiliary: No focal liver abnormality is seen. No gallstones, gallbladder wall thickening, or biliary dilatation. Pancreas: Unremarkable. No pancreatic ductal dilatation or surrounding inflammatory changes. Spleen: Normal in size without focal abnormality. Adrenals/Urinary Tract: Normal adrenal glands. Kidneys normal in size, orientation and position with symmetric enhancement and excretion. No renal masses. No hydronephrosis. Normal ureters. Bladder unremarkable. Stomach/Bowel: Stomach is within normal limits. Appendix appears normal. No evidence of bowel wall thickening, distention, or inflammatory changes. Vascular/Lymphatic: No significant vascular findings are present. No enlarged abdominal or pelvic lymph nodes. Reproductive: Prostate  mildly enlarged, 4.8 x 3.5 cm transversely. Other: No abdominal wall hernia or abnormality. No abdominopelvic ascites. Musculoskeletal: Chronic bilateral pars defects at L5-S1. Minimal anterolisthesis. Skeletal structures otherwise unremarkable. IMPRESSION: 1. No evidence of a primary malignancy or metastatic disease within the chest, abdomen or pelvis. 2. No acute findings within the chest, abdomen or pelvis. Electronically Signed   By: Lajean Manes M.D.   On: 06/10/2020 09:52   MR Brain W and Wo Contrast  Result Date: 06/10/2020 CLINICAL DATA:  Brain mass EXAM: MRI HEAD WITHOUT AND WITH CONTRAST TECHNIQUE: Multiplanar, multiecho pulse sequences of the brain and surrounding structures were obtained without and with intravenous contrast. CONTRAST:  7.91mL GADAVIST GADOBUTROL 1 MMOL/ML IV SOLN COMPARISON:  Brain MRI 12/18/2019 FINDINGS: Brain: No acute infarct, acute hemorrhage or extra-axial collection. Slight worsening of abnormal hyperintense T2-weighted signal within the left temporal lobe. Mass effect on the left lateral ventricle and brainstem is slightly increased. There is  no contrast enhancement within the lesion. No chronic microhemorrhage. Normal midline structures. Vascular: Normal flow voids. Skull and upper cervical spine: Normal marrow signal. Sinuses/Orbits: Negative. Other: None. 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 Electronically Signed   By: Ulyses Jarred M.D.   On: 06/10/2020 03:12   CT ABDOMEN PELVIS W CONTRAST  Result Date: 06/10/2020 CLINICAL DATA:  Brain/CNS neoplasm. Evaluate for metastatic disease/primary neoplasm. EXAM: CT CHEST, ABDOMEN, AND PELVIS WITH CONTRAST TECHNIQUE: Multidetector CT imaging of the chest, abdomen and pelvis was performed following the standard protocol during bolus administration of intravenous contrast. CONTRAST:  165mL OMNIPAQUE IOHEXOL 300 MG/ML  SOLN COMPARISON:  Brain MRI, 06/10/2020. Abdomen radiographs, 06/10/2020. FINDINGS: CT CHEST FINDINGS Cardiovascular: Heart normal in size and configuration. No pericardial effusion. No coronary artery calcifications. Normal great vessels and widely patent aortic arch branch vessels. Mediastinum/Nodes: Normal thyroid. No neck base, axillary, mediastinal or hilar masses or enlarged lymph nodes. Normal trachea and esophagus. Lungs/Pleura: Minor dependent atelectasis. Calcified granuloma in the left upper lobe lingula. No evidence of pneumonia or pulmonary edema. No lung mass or suspicious nodule. No pleural effusion or pneumothorax. Musculoskeletal: No fracture or bone lesion. No significant skeletal abnormality. No chest wall mass. CT ABDOMEN PELVIS FINDINGS Hepatobiliary: No focal liver abnormality is seen. No gallstones, gallbladder wall thickening, or biliary dilatation. Pancreas: Unremarkable. No pancreatic ductal dilatation or surrounding inflammatory changes. Spleen: Normal in size without focal abnormality. Adrenals/Urinary Tract: Normal adrenal glands. Kidneys  normal in size, orientation and position with symmetric enhancement and excretion. No renal masses. No hydronephrosis. Normal ureters. Bladder unremarkable. Stomach/Bowel: Stomach is within normal limits. Appendix appears normal. No evidence of bowel wall thickening, distention, or inflammatory changes. Vascular/Lymphatic: No significant vascular findings are present. No enlarged abdominal or pelvic lymph nodes. Reproductive: Prostate mildly enlarged, 4.8 x 3.5 cm transversely. Other: No abdominal wall hernia or abnormality. No abdominopelvic ascites. Musculoskeletal: Chronic bilateral pars defects at L5-S1. Minimal anterolisthesis. Skeletal structures otherwise unremarkable. IMPRESSION: 1. No evidence of a primary malignancy or metastatic disease within the chest, abdomen or pelvis. 2. No acute findings within the chest, abdomen or pelvis. Electronically Signed   By: Lajean Manes M.D.   On: 06/10/2020 09:52      Scheduled Meds: . demeclocycline  300 mg Oral Q6H  . furosemide  20 mg Oral BID  . phenytoin (DILANTIN) IV  100 mg Intravenous Q8H   Continuous Infusions: . levETIRAcetam 1,500 mg (06/10/20 1608)  LOS: 0 days      Debbe Odea, MD [facility] Pager: [email or web address] 06/10/2020, 5:23 PM

## 2020-06-10 NOTE — Consult Note (Signed)
Reason for Consult: Hyponatremia Referring Physician: [name] is an 59 y.o. male with apparently no past medical history. The history is mostly obtained by the chart. Patient is really unable to answer specific questions about recent events. In May 2021 he presented to [facility] health care following a seizure. 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 Apparently, he felt well so did not follow-up. 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 Therefore, his truck was disabled and he was brought to the most code emergency department. 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 Also, was noted to have a sodium level of 126 and that is the reason for our consultation-work-up shows serum osmolality 262, urine osmolality 874 and urine sodium 121.  Cortisol normal at 21 as is TSH.  While he has been in the emergency department sodium has decreased to 122.  Attempted to speak with the patient through interpreter.  He appears to be intermittently aphasic.  Not able to give me good history.  His volume status and blood pressure seems fine   Trend in Creatinine: Creatinine, Ser  Date/Time Value Ref Range Status  06/10/2020 07:27 AM 0.70 0.61 - 1.24 mg/dL Final  06/10/2020 03:09 AM 0.70 0.61 - 1.24 mg/dL Final  06/09/2020 09:11 PM 0.60 (L) 0.61 - 1.24 mg/dL Final  06/09/2020 08:55 PM 0.78 0.61 - 1.24 mg/dL Final   Sodium  Date/Time Value Ref Range Status  06/10/2020 07:27 AM 122 (L) 135 - 145 mmol/L Final  06/10/2020 03:09 AM 125 (L) 135 - 145 mmol/L Final  06/09/2020 09:11 PM 126 (L) 135 - 145 mmol/L Final  06/09/2020 08:55 PM 126 (L) 135 - 145 mmol/L Final   PMH:  History reviewed. No pertinent past medical history.  PSH:  History reviewed. No pertinent surgical history.  Allergies: No  Known Allergies  Medications:   Prior to Admission medications   Not on File    Discontinued Meds:   Medications Discontinued During This Encounter  Medication Reason  . levETIRAcetam (KEPPRA) 2,000 mg in sodium chloride 0.9 % 250 mL IVPB   . LORazepam (ATIVAN) bolus via infusion 2 mg     Social History:  reports that he has never smoked. He has never used smokeless tobacco. He reports that he does not drink alcohol. No history on file for drug use.  Family History:   Family History  Problem Relation Age of Onset  . Cancer Neg Hx     A comprehensive review of systems was negative except for: Neurological: positive for memory problems and speech problems  Blood pressure (!) 120/95, pulse 82, temperature 99.1 F (37.3 C), temperature source Oral, resp. rate (!) 26, height 5\' 5"  (1.651 m), weight 72.6 kg, SpO2 98 %. General appearance: alert, distracted and slowed mentation Resp: clear to auscultation bilaterally Cardio: regular rate and rhythm, S1, S2 normal, no murmur, click, rub or gallop GI: soft, non-tender; bowel sounds normal; no masses,  no organomegaly Extremities: extremities normal, atraumatic, no cyanosis or edema Neurologic: Mental status: Alert, oriented, thought content appropriate, alertness: alert, Some speech issues Not able to answer many questions.  At the end of interview some spontaneous mouth movements present  Labs: Basic Metabolic Panel: Recent Labs  Lab 06/09/20 2055 06/09/20 2111 06/10/20 0309 06/10/20 0727  NA 126* 126*  125* 122*  K 3.8 3.9 3.6 3.5  CL 90* 92* 92* 88*  CO2 25  --  24 25  GLUCOSE 142* 138* 121* 122*  BUN 12 13 9 8   CREATININE 0.78 0.60* 0.70 0.70  ALBUMIN 4.5  --   --   --   CALCIUM 8.9  --  8.3* 8.4*   Liver Function Tests: Recent Labs  Lab 06/09/20 2055  AST 30  ALT 23  ALKPHOS 55  BILITOT 1.0  PROT 7.7  ALBUMIN 4.5   No results for input(s): LIPASE, AMYLASE in the last 168 hours. No results for input(s):  AMMONIA in the last 168 hours. CBC: Recent Labs  Lab 06/09/20 2055 06/09/20 2111 06/10/20 0309  WBC 9.1  --  9.6  NEUTROABS 7.7  --  7.5  HGB 14.9 14.6 13.1  HCT 43.6 43.0 38.2*  MCV 82.1  --  83.4  PLT 307  --  283   PT/INR: @labrcntip (inr:5) Cardiac Enzymes: No results for input(s): CKTOTAL, CKMB, CKMBINDEX, TROPONINI in the last 168 hours. CBG: Recent Labs  Lab 06/09/20 2019  GLUCAP 129*    Iron Studies: No results for input(s): IRON, TIBC, TRANSFERRIN, FERRITIN in the last 168 hours.  Xrays/Other Studies: EEG  Result Date: 06/10/2020 Hunter Doom, MD     06/10/2020 10:25 AM History: 59 year old male with no mass presented with altered mental status Sedation: None Technique: This is a 21 channel routine scalp EEG performed at the bedside with bipolar and monopolar montages arranged in accordance to the international 10/20 system of electrode placement. One channel was dedicated to EKG recording. Background: The background consists of intermixed alpha and beta activities. There is a well defined posterior dominant rhythm of 9 hz that attenuates with eye opening.  There is focal left frontotemporal slowing.  He had three seizures lasting 2:07, 2:42 and 1:27 with poorly localized left hemispheric onset. No definite clinical correlate was seen, though with the first there is possibly some liip smacking, but not clear on video due to darkening for photic stimulation. Photic stimulation and HV were performed. EEG Abnormalities: 1) Three focal left hemispheric seizures. As above. 2) Left frontotemporal seizures Clinical Interpretation: This EEG recorded three discrete focal seizures arising from the left hemisphere with no definite clinical correlate. Roland Rack, MD [facility] [phone] If 7pm- 7am, please page neurology on call as listed in [place].   DG Chest 1 View  Result Date: 06/10/2020 CLINICAL DATA:  Altered mental status EXAM: CHEST  1 VIEW  COMPARISON:  None. FINDINGS: Benign calcified granuloma noted at the left lung base. Lungs are otherwise clear. No pneumothorax or pleural effusion. Cardiac size within normal limits. Pulmonary vascularity is normal. No acute bone abnormality. IMPRESSION: No active disease. Electronically Signed   By: Fidela Salisbury MD   On: 06/10/2020 01:06   DG Abdomen 1 View  Result Date: 06/10/2020 CLINICAL DATA:  MRI clearance EXAM: ABDOMEN - 1 VIEW COMPARISON:  None. FINDINGS: The bowel gas pattern is normal. No radio-opaque calculi or other significant radiographic abnormality are seen. No metallic foreign body. IMPRESSION: No metallic foreign body. Nonobstructive bowel gas pattern. Electronically Signed   By: Ulyses Jarred M.D.   On: 06/10/2020 01:07   CT HEAD WO CONTRAST  Addendum Date: 06/09/2020   ADDENDUM REPORT: 06/09/2020 22:24 ADDENDUM: These results were called by telephone at the time of interpretation on 06/09/2020 at 10:13 pm to provider Dr. Alvino Chapel, who verbally acknowledged these results. Electronically Signed   By: Georga Kaufmann  [place] M.D.   On: 06/09/2020 22:24   Result Date: 06/09/2020 CLINICAL DATA:  Mental status change, unknown cause. EXAM: CT HEAD WITHOUT CONTRAST TECHNIQUE: Contiguous axial images were obtained from the base of the skull through the vertex without intravenous contrast. COMPARISON:  12/18/2019 MRI/MRA head.  12/12/2019 head CT. FINDINGS: Brain: Redemonstration of infiltrative hypodensity involving the left temporal lobe. Partial effacement of the left lateral ventricle, increased when compared to prior MRI. Abutment of the left midbrain by the mesial left temporal lobe appears more conspicuous. New rightward midline shift of 4 mm. No ventriculomegaly or extra-axial fluid collection. No intracranial hemorrhage. Vascular: No hyperdense vessel or unexpected calcification. Skull: No acute finding. Sinuses/Orbits: Normal orbits. Clear paranasal sinuses. No mastoid effusion.  Other: None. IMPRESSION: Redemonstration of infiltrative left temporal lesion. Increased partial effacement of the left lateral ventricle and abutment of the left midbrain. New rightward midline shift of 4 mm. MRI head with and without contrast is recommended for better evaluation. Electronically Signed: By: Primitivo Gauze M.D. On: 06/09/2020 22:06   CT CHEST W CONTRAST  Result Date: 06/10/2020 CLINICAL DATA:  Brain/CNS neoplasm. Evaluate for metastatic disease/primary neoplasm. EXAM: CT CHEST, ABDOMEN, AND PELVIS WITH CONTRAST TECHNIQUE: Multidetector CT imaging of the chest, abdomen and pelvis was performed following the standard protocol during bolus administration of intravenous contrast. CONTRAST:  123mL OMNIPAQUE IOHEXOL 300 MG/ML  SOLN COMPARISON:  Brain MRI, 06/10/2020. Abdomen radiographs, 06/10/2020. FINDINGS: CT CHEST FINDINGS Cardiovascular: Heart normal in size and configuration. No pericardial effusion. No coronary artery calcifications. Normal great vessels and widely patent aortic arch branch vessels. Mediastinum/Nodes: Normal thyroid. No neck base, axillary, mediastinal or hilar masses or enlarged lymph nodes. Normal trachea and esophagus. Lungs/Pleura: Minor dependent atelectasis. Calcified granuloma in the left upper lobe lingula. No evidence of pneumonia or pulmonary edema. No lung mass or suspicious nodule. No pleural effusion or pneumothorax. Musculoskeletal: No fracture or bone lesion. No significant skeletal abnormality. No chest wall mass. CT ABDOMEN PELVIS FINDINGS Hepatobiliary: No focal liver abnormality is seen. No gallstones, gallbladder wall thickening, or biliary dilatation. Pancreas: Unremarkable. No pancreatic ductal dilatation or surrounding inflammatory changes. Spleen: Normal in size without focal abnormality. Adrenals/Urinary Tract: Normal adrenal glands. Kidneys normal in size, orientation and position with symmetric enhancement and excretion. No renal masses. No  hydronephrosis. Normal ureters. Bladder unremarkable. Stomach/Bowel: Stomach is within normal limits. Appendix appears normal. No evidence of bowel wall thickening, distention, or inflammatory changes. Vascular/Lymphatic: No significant vascular findings are present. No enlarged abdominal or pelvic lymph nodes. Reproductive: Prostate mildly enlarged, 4.8 x 3.5 cm transversely. Other: No abdominal wall hernia or abnormality. No abdominopelvic ascites. Musculoskeletal: Chronic bilateral pars defects at L5-S1. Minimal anterolisthesis. Skeletal structures otherwise unremarkable. IMPRESSION: 1. No evidence of a primary malignancy or metastatic disease within the chest, abdomen or pelvis. 2. No acute findings within the chest, abdomen or pelvis. Electronically Signed   By: Lajean Manes M.D.   On: 06/10/2020 09:52   MR Brain W and Wo Contrast  Result Date: 06/10/2020 CLINICAL DATA:  Brain mass EXAM: MRI HEAD WITHOUT AND WITH CONTRAST TECHNIQUE: Multiplanar, multiecho pulse sequences of the brain and surrounding structures were obtained without and with intravenous contrast. CONTRAST:  7.7mL GADAVIST GADOBUTROL 1 MMOL/ML IV SOLN COMPARISON:  Brain MRI 12/18/2019 FINDINGS: Brain: No acute infarct, acute hemorrhage or extra-axial collection. Slight worsening of abnormal hyperintense T2-weighted signal within the left temporal lobe. Mass effect on the left lateral ventricle and brainstem is slightly increased. There is no contrast enhancement  within the lesion. No chronic microhemorrhage. Normal midline structures. Vascular: Normal flow voids. Skull and upper cervical spine: Normal marrow signal. Sinuses/Orbits: Negative. Other: None. 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 Electronically Signed   By: Ulyses Jarred M.D.   On: 06/10/2020 03:12   CT ABDOMEN PELVIS W CONTRAST  Result Date:  06/10/2020 CLINICAL DATA:  Brain/CNS neoplasm. Evaluate for metastatic disease/primary neoplasm. EXAM: CT CHEST, ABDOMEN, AND PELVIS WITH CONTRAST TECHNIQUE: Multidetector CT imaging of the chest, abdomen and pelvis was performed following the standard protocol during bolus administration of intravenous contrast. CONTRAST:  136mL OMNIPAQUE IOHEXOL 300 MG/ML  SOLN COMPARISON:  Brain MRI, 06/10/2020. Abdomen radiographs, 06/10/2020. FINDINGS: CT CHEST FINDINGS Cardiovascular: Heart normal in size and configuration. No pericardial effusion. No coronary artery calcifications. Normal great vessels and widely patent aortic arch branch vessels. Mediastinum/Nodes: Normal thyroid. No neck base, axillary, mediastinal or hilar masses or enlarged lymph nodes. Normal trachea and esophagus. Lungs/Pleura: Minor dependent atelectasis. Calcified granuloma in the left upper lobe lingula. No evidence of pneumonia or pulmonary edema. No lung mass or suspicious nodule. No pleural effusion or pneumothorax. Musculoskeletal: No fracture or bone lesion. No significant skeletal abnormality. No chest wall mass. CT ABDOMEN PELVIS FINDINGS Hepatobiliary: No focal liver abnormality is seen. No gallstones, gallbladder wall thickening, or biliary dilatation. Pancreas: Unremarkable. No pancreatic ductal dilatation or surrounding inflammatory changes. Spleen: Normal in size without focal abnormality. Adrenals/Urinary Tract: Normal adrenal glands. Kidneys normal in size, orientation and position with symmetric enhancement and excretion. No renal masses. No hydronephrosis. Normal ureters. Bladder unremarkable. Stomach/Bowel: Stomach is within normal limits. Appendix appears normal. No evidence of bowel wall thickening, distention, or inflammatory changes. Vascular/Lymphatic: No significant vascular findings are present. No enlarged abdominal or pelvic lymph nodes. Reproductive: Prostate mildly enlarged, 4.8 x 3.5 cm transversely. Other: No abdominal  wall hernia or abnormality. No abdominopelvic ascites. Musculoskeletal: Chronic bilateral pars defects at L5-S1. Minimal anterolisthesis. Skeletal structures otherwise unremarkable. IMPRESSION: 1. No evidence of a primary malignancy or metastatic disease within the chest, abdomen or pelvis. 2. No acute findings within the chest, abdomen or pelvis. Electronically Signed   By: Lajean Manes M.D.   On: 06/10/2020 09:52     Assessment/Plan: 59 year old Hispanic male with brain mass and hyponatremia  1.  Brain mass - full body imaging does not show any primary focus of malignancy.  Neurosurgery is concerned this could represent a glioma.  Continued work-up via appropriate services 2.  Seizures-I feel that the brain mass as opposed to the hyponatremia is probably responsible for the seizure issue.  Neurology on board, started on [place] 3.  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  In this case especially would not want to correct sodium too quickly.  Seems euvolemic.  We'll start by giving low-dose oral Lasix as well as demeclocycline to dilute urine and check sodium frequently.  Would not use 3% saline in this situation.  Will monitor sodium closely.  He may require Samsca but because of its usually quick action am hesitant to do this at this time because I don't want to correct the sodium too quickly   [employer] 06/10/2020, 1:06 PM

## 2020-06-10 NOTE — Progress Notes (Signed)
EEG complete - results pending 

## 2020-06-10 NOTE — ED Notes (Signed)
EEG at bedside.

## 2020-06-10 NOTE — ED Notes (Signed)
Wife contact Patrecia Pour [phone]  [employer] Nurse Royetta Asal [phone] call if pt needs a ride back home.

## 2020-06-10 NOTE — Procedures (Signed)
History: 59 year old male with no mass presented with altered mental status  Sedation: None  Technique: This is a 21 channel routine scalp EEG performed at the bedside with bipolar and monopolar montages arranged in accordance to the international 10/20 system of electrode placement. One channel was dedicated to EKG recording.   Background: The background consists of intermixed alpha and beta activities. There is a well defined posterior dominant rhythm of 9 hz that attenuates with eye opening.  There is focal left frontotemporal slowing.  He had three seizures lasting 2:07, 2:42 and 1:27 with poorly localized left hemispheric onset. No definite clinical correlate was seen, though with the first there is possibly some liip smacking, but not clear on video due to darkening for photic stimulation.    Photic stimulation and HV were performed.   EEG Abnormalities: 1) Three focal left hemispheric seizures. As above.  2) Left frontotemporal seizures  Clinical Interpretation: This EEG recorded three discrete focal seizures arising from the left hemisphere with no definite clinical correlate.  Roland Rack, MD [facility] [phone]  If 7pm- 7am, please page neurology on call as listed in [place].

## 2020-06-10 NOTE — Progress Notes (Addendum)
LTM EEG hooked up and running - no initial skin breakdown - [facility] verified for push button test - neuro notified. [facility] monitored

## 2020-06-10 NOTE — Consult Note (Signed)
Reason for Consult: Left temporal mass Referring Physician: ER  Hunter Terry is an 59 y.o. male.  HPI: 59 year old gentleman with a known left temporal mass diagnosed several months ago presented with altered mental status work-up has revealed expansion and continued oval infiltration of left temporal mass.  Patient was currently getting an EEG when I was talking with him examining him.  Not complaining of any headaches but confused and difficult and poor historian  History reviewed. No pertinent past medical history.  History reviewed. No pertinent surgical history.  Family History  Problem Relation Age of Onset  . Cancer Neg Hx     Social History:  reports that he has never smoked. He has never used smokeless tobacco. He reports that he does not drink alcohol. No history on file for drug use.  Allergies: No Known Allergies  Medications: I have reviewed the patient's current medications.  Results for orders placed or performed during the hospital encounter of 06/09/20 (from the past 48 hour(s))  CBG monitoring, ED     Status: Abnormal   Collection Time: 06/09/20  8:19 PM  Result Value Ref Range   Glucose-Capillary 129 (H) 70 - 99 mg/dL    Comment: Glucose reference range applies only to samples taken after fasting for at least 8 hours.  Ethanol     Status: None   Collection Time: 06/09/20  8:55 PM  Result Value Ref Range   Alcohol, Ethyl (B) <10 <10 mg/dL    Comment: (NOTE) Lowest detectable limit for serum alcohol is 10 mg/dL.  For medical purposes only. Performed at [facility], [place] [address] [place], [place] [address]   Protime-INR     Status: None   Collection Time: 06/09/20  8:55 PM  Result Value Ref Range   Prothrombin Time 12.6 11.4 - 15.2 seconds   INR 1.0 0.8 - 1.2    Comment: (NOTE) INR goal varies based on device and disease states. Performed at [facility], [place] [address], [place] [address]   APTT     Status: None   Collection  Time: 06/09/20  8:55 PM  Result Value Ref Range   aPTT 25 24 - 36 seconds    Comment: Performed at [facility] [address] [place] [address]  CBC     Status: None   Collection Time: 06/09/20  8:55 PM  Result Value Ref Range   WBC 9.1 4.0 - 10.5 K/uL   RBC 5.31 4.22 - 5.81 MIL/uL   Hemoglobin 14.9 13.0 - 17.0 g/dL   HCT 43.6 39 - 52 %   MCV 82.1 80.0 - 100.0 fL   MCH 28.1 26.0 - 34.0 pg   MCHC 34.2 30.0 - 36.0 g/dL   RDW 12.7 11.5 - 15.5 %   Platelets 307 150 - 400 K/uL   nRBC 0.0 0.0 - 0.2 %    Comment: Performed at [facility], [place] [address], [place], [place] [address]  Differential     Status: None   Collection Time: 06/09/20  8:55 PM  Result Value Ref Range   Neutrophils Relative % 85 %   Neutro Abs 7.7 1.7 - 7.7 K/uL   Lymphocytes Relative 9 %   Lymphs Abs 0.8 0.7 - 4.0 K/uL   Monocytes Relative 5 %   Monocytes Absolute 0.4 0.1 - 1.0 K/uL   Eosinophils Relative 0 %   Eosinophils Absolute 0.0 0.0 - 0.5 K/uL   Basophils Relative  0 %   Basophils Absolute 0.0 0.0 - 0.1 K/uL   Immature Granulocytes 1 %   Abs Immature Granulocytes 0.07 0.00 - 0.07 K/uL    Comment: Performed at [facility], [place] [address], [place], [place] [address]  Comprehensive metabolic panel     Status: Abnormal   Collection Time: 06/09/20  8:55 PM  Result Value Ref Range   Sodium 126 (L) 135 - 145 mmol/L   Potassium 3.8 3.5 - 5.1 mmol/L   Chloride 90 (L) 98 - 111 mmol/L   CO2 25 22 - 32 mmol/L   Glucose, Bld 142 (H) 70 - 99 mg/dL    Comment: Glucose reference range applies only to samples taken after fasting for at least 8 hours.   BUN 12 6 - 20 mg/dL   Creatinine, Ser 0.78 0.61 - 1.24 mg/dL   Calcium 8.9 8.9 - 10.3 mg/dL   Total Protein 7.7 6.5 - 8.1 g/dL   Albumin 4.5 3.5 - 5.0 g/dL   AST 30 15 - 41 U/L   ALT 23 0 - 44 U/L   Alkaline Phosphatase 55 38 - 126 U/L   Total Bilirubin 1.0 0.3 - 1.2 mg/dL   GFR, Estimated >60 >60 mL/min    Comment:  (NOTE) Calculated using the CKD-EPI Creatinine Equation (2021)    Anion gap 11 5 - 15    Comment: Performed at [facility] [address], [place], [place] [address]  Urine rapid drug screen (hosp performed)     Status: None   Collection Time: 06/09/20  8:55 PM  Result Value Ref Range   Opiates NONE DETECTED NONE DETECTED   Cocaine NONE DETECTED NONE DETECTED   Benzodiazepines NONE DETECTED NONE DETECTED   Amphetamines NONE DETECTED NONE DETECTED   Tetrahydrocannabinol NONE DETECTED NONE DETECTED   Barbiturates NONE DETECTED NONE DETECTED    Comment: (NOTE) DRUG SCREEN FOR MEDICAL PURPOSES ONLY.  IF CONFIRMATION IS NEEDED FOR ANY PURPOSE, NOTIFY LAB WITHIN 5 DAYS.  LOWEST DETECTABLE LIMITS FOR URINE DRUG SCREEN Drug Class                     Cutoff (ng/mL) Amphetamine and metabolites    1000 Barbiturate and metabolites    200 Benzodiazepine                 881 Tricyclics and metabolites     300 Opiates and metabolites        300 Cocaine and metabolites        300 THC                            50 Performed at [facility], [place] [address] [place], [place] [address]   Urinalysis, Routine w reflex microscopic     Status: Abnormal   Collection Time: 06/09/20  8:55 PM  Result Value Ref Range   Color, Urine YELLOW YELLOW   APPearance HAZY (A) CLEAR   Specific Gravity, Urine 1.028 1.005 - 1.030   pH 5.0 5.0 - 8.0   Glucose, UA 150 (A) NEGATIVE mg/dL   Hgb urine dipstick NEGATIVE NEGATIVE   Bilirubin Urine NEGATIVE NEGATIVE   Ketones, ur 5 (A) NEGATIVE mg/dL   Protein, ur 30 (A) NEGATIVE mg/dL   Nitrite NEGATIVE NEGATIVE   Leukocytes,Ua NEGATIVE NEGATIVE   RBC / HPF 0-5 0 - 5 RBC/hpf   WBC, UA 0-5 0 - 5 WBC/hpf  Bacteria, UA NONE SEEN NONE SEEN   Mucus PRESENT     Comment: Performed at [facility] [address], [place], [place] [address]  Respiratory Panel by RT PCR (Flu A&B, Covid) - Nasopharyngeal Swab     Status: None   Collection Time:  06/09/20  8:55 PM   Specimen: Nasopharyngeal Swab  Result Value Ref Range   SARS Coronavirus 2 by RT PCR NEGATIVE NEGATIVE    Comment: (NOTE) SARS-CoV-2 target nucleic acids are NOT DETECTED.  The SARS-CoV-2 RNA is generally detectable in upper respiratoy specimens during the acute phase of infection. The lowest concentration of SARS-CoV-2 viral copies this assay can detect is 131 copies/mL. A negative result does not preclude SARS-Cov-2 infection and should not be used as the sole basis for treatment or other patient management decisions. A negative result may occur with  improper specimen collection/handling, submission of specimen other than nasopharyngeal swab, presence of viral mutation(s) within the areas targeted by this assay, and inadequate number of viral copies (<131 copies/mL). A negative result must be combined with clinical observations, patient history, and epidemiological information. The expected result is Negative.  Fact Sheet for Patients:  PinkCheek.be  Fact Sheet for Healthcare Providers:  GravelBags.it  This test is no t yet approved or cleared by the [place] FDA and  has been authorized for detection and/or diagnosis of SARS-CoV-2 by FDA under an Emergency Use Authorization (EUA). This EUA will remain  in effect (meaning this test can be used) for the duration of the COVID-19 declaration under Section 564(b)(1) of the Act, 21 U.S.C. section 360bbb-3(b)(1), unless the authorization is terminated or revoked sooner.     Influenza A by PCR NEGATIVE NEGATIVE   Influenza B by PCR NEGATIVE NEGATIVE    Comment: (NOTE) The Xpert Xpress SARS-CoV-2/FLU/RSV assay is intended as an aid in  the diagnosis of influenza from Nasopharyngeal swab specimens and  should not be used as a sole basis for treatment. Nasal washings and  aspirates are unacceptable for Xpert Xpress SARS-CoV-2/FLU/RSV  testing.  Fact  Sheet for Patients: PinkCheek.be  Fact Sheet for Healthcare Providers: GravelBags.it  This test is not yet approved or cleared by the [place] FDA and  has been authorized for detection and/or diagnosis of SARS-CoV-2 by  FDA under an Emergency Use Authorization (EUA). This EUA will remain  in effect (meaning this test can be used) for the duration of the  Covid-19 declaration under Section 564(b)(1) of the Act, 21  U.S.C. section 360bbb-3(b)(1), unless the authorization is  terminated or revoked. Performed at [facility], [place] [address], [place], [place] [address]   I-stat chem 8, ED     Status: Abnormal   Collection Time: 06/09/20  9:11 PM  Result Value Ref Range   Sodium 126 (L) 135 - 145 mmol/L   Potassium 3.9 3.5 - 5.1 mmol/L   Chloride 92 (L) 98 - 111 mmol/L   BUN 13 6 - 20 mg/dL   Creatinine, Ser 0.60 (L) 0.61 - 1.24 mg/dL   Glucose, Bld 138 (H) 70 - 99 mg/dL    Comment: Glucose reference range applies only to samples taken after fasting for at least 8 hours.   Calcium, Ion 0.96 (L) 1.15 - 1.40 mmol/L   TCO2 26 22 - 32 mmol/L   Hemoglobin 14.6 13.0 - 17.0 g/dL   HCT 43.0 39 - 52 %  Basic metabolic panel     Status: Abnormal   Collection Time: 06/10/20  3:09 AM  Result Value Ref Range   Sodium 125 (L) 135 - 145 mmol/L   Potassium 3.6 3.5 - 5.1 mmol/L   Chloride 92 (L) 98 - 111 mmol/L   CO2 24 22 - 32 mmol/L   Glucose, Bld 121 (H) 70 - 99 mg/dL    Comment: Glucose reference range applies only to samples taken after fasting for at least 8 hours.   BUN 9 6 - 20 mg/dL   Creatinine, Ser 0.70 0.61 - 1.24 mg/dL   Calcium 8.3 (L) 8.9 - 10.3 mg/dL   GFR, Estimated >60 >60 mL/min    Comment: (NOTE) Calculated using the CKD-EPI Creatinine Equation (2021)    Anion gap 9 5 - 15    Comment: Performed at [facility] [address], [place], [place] [address]  CBC WITH DIFFERENTIAL     Status: Abnormal    Collection Time: 06/10/20  3:09 AM  Result Value Ref Range   WBC 9.6 4.0 - 10.5 K/uL   RBC 4.58 4.22 - 5.81 MIL/uL   Hemoglobin 13.1 13.0 - 17.0 g/dL   HCT 38.2 (L) 39 - 52 %   MCV 83.4 80.0 - 100.0 fL   MCH 28.6 26.0 - 34.0 pg   MCHC 34.3 30.0 - 36.0 g/dL   RDW 12.7 11.5 - 15.5 %   Platelets 283 150 - 400 K/uL   nRBC 0.0 0.0 - 0.2 %   Neutrophils Relative % 79 %   Neutro Abs 7.5 1.7 - 7.7 K/uL   Lymphocytes Relative 13 %   Lymphs Abs 1.3 0.7 - 4.0 K/uL   Monocytes Relative 8 %   Monocytes Absolute 0.7 0.1 - 1.0 K/uL   Eosinophils Relative 0 %   Eosinophils Absolute 0.0 0.0 - 0.5 K/uL   Basophils Relative 0 %   Basophils Absolute 0.0 0.0 - 0.1 K/uL   Immature Granulocytes 0 %   Abs Immature Granulocytes 0.04 0.00 - 0.07 K/uL    Comment: Performed at [facility] [address], [place], [place] [address]  Osmolality     Status: Abnormal   Collection Time: 06/10/20  3:09 AM  Result Value Ref Range   Osmolality 262 (L) 275 - 295 mOsm/kg    Comment: Performed at [facility], [place] [address], [place], [place] [address]  Cortisol     Status: None   Collection Time: 06/10/20  3:09 AM  Result Value Ref Range   Cortisol, Plasma 21.1 ug/dL    Comment: (NOTE) AM    6.7 - 22.6 ug/dL PM   <10.0       ug/dL Performed at [facility] [address] [place], [place] [address]   TSH     Status: None   Collection Time: 06/10/20  3:09 AM  Result Value Ref Range   TSH 1.578 0.350 - 4.500 uIU/mL    Comment: Performed by a 3rd Generation assay with a functional sensitivity of <=0.01 uIU/mL. Performed at [facility], [place] [address] [place], [place] [address]   Osmolality, urine     Status: None   Collection Time: 06/10/20  3:49 AM  Result Value Ref Range   Osmolality, Ur 874 300 - 900 mOsm/kg    Comment: Performed at [facility] [address], [place], [place] [address]  Sodium, urine, random     Status: None   Collection Time: 06/10/20  3:49 AM   Result Value Ref Range  Sodium, Ur 121 mmol/L    Comment: Performed at [facility], [place] [address] [place], [place] [address]  Basic metabolic panel     Status: Abnormal   Collection Time: 06/10/20  7:27 AM  Result Value Ref Range   Sodium 122 (L) 135 - 145 mmol/L   Potassium 3.5 3.5 - 5.1 mmol/L   Chloride 88 (L) 98 - 111 mmol/L   CO2 25 22 - 32 mmol/L   Glucose, Bld 122 (H) 70 - 99 mg/dL    Comment: Glucose reference range applies only to samples taken after fasting for at least 8 hours.   BUN 8 6 - 20 mg/dL   Creatinine, Ser 0.70 0.61 - 1.24 mg/dL   Calcium 8.4 (L) 8.9 - 10.3 mg/dL   GFR, Estimated >60 >60 mL/min    Comment: (NOTE) Calculated using the CKD-EPI Creatinine Equation (2021)    Anion gap 9 5 - 15    Comment: Performed at [facility] [address], [place], [place] [address]    DG Chest 1 View  Result Date: 06/10/2020 CLINICAL DATA:  Altered mental status EXAM: CHEST  1 VIEW COMPARISON:  None. FINDINGS: Benign calcified granuloma noted at the left lung base. Lungs are otherwise clear. No pneumothorax or pleural effusion. Cardiac size within normal limits. Pulmonary vascularity is normal. No acute bone abnormality. IMPRESSION: No active disease. Electronically Signed   By: Fidela Salisbury MD   On: 06/10/2020 01:06   DG Abdomen 1 View  Result Date: 06/10/2020 CLINICAL DATA:  MRI clearance EXAM: ABDOMEN - 1 VIEW COMPARISON:  None. FINDINGS: The bowel gas pattern is normal. No radio-opaque calculi or other significant radiographic abnormality are seen. No metallic foreign body. IMPRESSION: No metallic foreign body. Nonobstructive bowel gas pattern. Electronically Signed   By: Ulyses Jarred M.D.   On: 06/10/2020 01:07   CT HEAD WO CONTRAST  Addendum Date: 06/09/2020   ADDENDUM REPORT: 06/09/2020 22:24 ADDENDUM: These results were called by telephone at the time of interpretation on 06/09/2020 at 10:13 pm to provider Dr. Alvino Chapel, who verbally  acknowledged these results. Electronically Signed   By: Primitivo Gauze M.D.   On: 06/09/2020 22:24   Result Date: 06/09/2020 CLINICAL DATA:  Mental status change, unknown cause. EXAM: CT HEAD WITHOUT CONTRAST TECHNIQUE: Contiguous axial images were obtained from the base of the skull through the vertex without intravenous contrast. COMPARISON:  12/18/2019 MRI/MRA head.  12/12/2019 head CT. FINDINGS: Brain: Redemonstration of infiltrative hypodensity involving the left temporal lobe. Partial effacement of the left lateral ventricle, increased when compared to prior MRI. Abutment of the left midbrain by the mesial left temporal lobe appears more conspicuous. New rightward midline shift of 4 mm. No ventriculomegaly or extra-axial fluid collection. No intracranial hemorrhage. Vascular: No hyperdense vessel or unexpected calcification. Skull: No acute finding. Sinuses/Orbits: Normal orbits. Clear paranasal sinuses. No mastoid effusion. Other: None. IMPRESSION: Redemonstration of infiltrative left temporal lesion. Increased partial effacement of the left lateral ventricle and abutment of the left midbrain. New rightward midline shift of 4 mm. MRI head with and without contrast is recommended for better evaluation. Electronically Signed: By: Primitivo Gauze M.D. On: 06/09/2020 22:06   MR Brain W and Wo Contrast  Result Date: 06/10/2020 CLINICAL DATA:  Brain mass EXAM: MRI HEAD WITHOUT AND WITH CONTRAST TECHNIQUE: Multiplanar, multiecho pulse sequences of the brain and surrounding structures were obtained without and with intravenous contrast. CONTRAST:  7.64mL GADAVIST GADOBUTROL 1 MMOL/ML IV SOLN COMPARISON:  Brain MRI 12/18/2019 FINDINGS: Brain: No acute infarct, acute hemorrhage or extra-axial collection. Slight worsening of abnormal hyperintense T2-weighted signal within the left temporal lobe. Mass effect on the left lateral ventricle and brainstem is slightly increased. There is no contrast  enhancement within the lesion. No chronic microhemorrhage. Normal midline structures. Vascular: Normal flow voids. Skull and upper cervical spine: Normal marrow signal. Sinuses/Orbits: Negative. Other: None. 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 Electronically Signed   By: Ulyses Jarred M.D.   On: 06/10/2020 03:12    Review of Systems  Unable to perform ROS: Mental status change   Blood pressure 134/74, pulse 75, temperature 99.1 F (37.3 C), temperature source Oral, resp. rate 17, height 5\' 5"  (1.651 m), weight 72.6 kg, SpO2 99 %. Physical Exam Neurological:     Comments: Patient is awake and alert pupils equal extract movements are intact strength is 5 out of 5 with no pronator drift.  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

## 2020-06-10 NOTE — Progress Notes (Signed)
Full consult to follow.  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  As sodium is only 122 I tend to feel that the seizure issue is more from the brain mass than the sodium.  I am going to start treatment gently with low dose PO lasix and demeclocycline to try and dilute urine.  If this fails will do samsca.  As samsca usually leads to an abrupt change I want to avoid that right now given the tenuous situation with his brain, looking to correct more slowly   Hunter Terry

## 2020-06-10 NOTE — Progress Notes (Signed)
Called by Nephrology, Dr. Moshe Cipro, who requests pt receive an NGT so that medication can be provided to help correct his sodium levels. Pt is not swallowing.  NGT order placed.

## 2020-06-10 NOTE — Progress Notes (Signed)
Pt not following the command to swallow, therefore can not take PO meds. Dr. Moshe Cipro informed.

## 2020-06-10 NOTE — Progress Notes (Signed)
the patient has been moved from er to 3w18, tech will follow to complete ltm set-up . qm

## 2020-06-10 NOTE — H&P (Signed)
History and Physical    Hunter Terry XVQ:[id number] DOB: [birth year] DOA: 06/09/2020  PCP: Patient, No Pcp Per  Patient coming from: Home.  History obtained from patient's wife through a Patent attorney and as patient is mildly confused to give any history.  Chief Complaint: Confusion.  HPI: Hunter Terry is a 59 y.o. male with with history of having been diagnosed with possible seizures in May 2021 at that time MRI brain done which showed possible left temporal infiltrative process for which patient did not seek any further medical advice is a truck driver for [employer] and was driving back from [place] to [place] when patient was found to be increasingly confused by patient's colleagues at workplace and since truck has a Heritage manager on the [employer] nurse was trying to communicate with the patient was found to be not following the commands and remotely the truck was stopped as per the report.  Patient was brought to the ER.  Per patient's wife patient waited talking to her last night had about half hour appeared confused and talking nonsensical things before becoming normal.  6 months ago patient had waking up from sleep with incontinence of urine and tongue bite at that time there was some concern for seizure since patient's grandfather also had similar symptoms with seizures.  That prompted patient to be taken to the ER had an MRI done which showed left temporal lesion for which further medical advice was not sought.  ED Course: In the ER patient is oriented to his name and place.  Follows commands moves all extremities.  CT of the head shows redemonstration of the infiltrative left temporal lesion increased partial effacement of the lateral ventricle and abutment of the left midbrain with midline shift of 4 mm.  On-call neurosurgery was consulted requested getting MRI brain with and without contrast and also will need metastatic work-up with CT chest abdomen pelvis.  Labs are significant for EKG showing  sinus tachycardia Covid test was negative sodium was 126.  Review of Systems: As per HPI, rest all negative.   History reviewed. No pertinent past medical history.  History reviewed. No pertinent surgical history.   reports that he has never smoked. He has never used smokeless tobacco. He reports that he does not drink alcohol. No history on file for drug use.  No Known Allergies  Family History  Problem Relation Age of Onset  . Cancer Neg Hx     Prior to Admission medications   Not on File    Physical Exam: Constitutional: Moderately built and nourished. Vitals:   06/09/20 2300 06/09/20 2330 06/10/20 0000 06/10/20 0200  BP: 126/74 132/74 125/71 120/73  Pulse: 72 70 72 64  Resp: 15 18 19 15   Temp:      TempSrc:      SpO2: 100% 99% 98% 97%  Weight:      Height:       Eyes: Anicteric no pallor. ENMT: No discharge from the ears eyes nose or mouth. Neck: No neck rigidity no mass felt. Respiratory: No rhonchi or crepitations. Cardiovascular: S1-S2 heard. Abdomen: Soft nontender bowel sounds present. Musculoskeletal: No edema. Skin: No rash. Neurologic: Alert awake oriented to his name and place moving all extremities 5 x 5.  No facial asymmetry tongue is midline. Psychiatric: Appears confused.   Labs on Admission: I have personally reviewed following labs and imaging studies  CBC: Recent Labs  Lab 06/09/20 2055 06/09/20 2111  WBC 9.1  --   NEUTROABS 7.7  --  HGB 14.9 14.6  HCT 43.6 43.0  MCV 82.1  --   PLT 307  --    Basic Metabolic Panel: Recent Labs  Lab 06/09/20 2055 06/09/20 2111  NA 126* 126*  K 3.8 3.9  CL 90* 92*  CO2 25  --   GLUCOSE 142* 138*  BUN 12 13  CREATININE 0.78 0.60*  CALCIUM 8.9  --    GFR: Estimated Creatinine Clearance: 86.5 mL/min (A) (by C-G formula based on SCr of 0.6 mg/dL (L)). Liver Function Tests: Recent Labs  Lab 06/09/20 2055  AST 30  ALT 23  ALKPHOS 55  BILITOT 1.0  PROT 7.7  ALBUMIN 4.5   No results  for input(s): LIPASE, AMYLASE in the last 168 hours. No results for input(s): AMMONIA in the last 168 hours. Coagulation Profile: Recent Labs  Lab 06/09/20 2055  INR 1.0   Cardiac Enzymes: No results for input(s): CKTOTAL, CKMB, CKMBINDEX, TROPONINI in the last 168 hours. BNP (last 3 results) No results for input(s): PROBNP in the last 8760 hours. HbA1C: No results for input(s): HGBA1C in the last 72 hours. CBG: Recent Labs  Lab 06/09/20 2019  GLUCAP 129*   Lipid Profile: No results for input(s): CHOL, HDL, LDLCALC, TRIG, CHOLHDL, LDLDIRECT in the last 72 hours. Thyroid Function Tests: No results for input(s): TSH, T4TOTAL, FREET4, T3FREE, THYROIDAB in the last 72 hours. Anemia Panel: No results for input(s): VITAMINB12, FOLATE, FERRITIN, TIBC, IRON, RETICCTPCT in the last 72 hours. Urine analysis:    Component Value Date/Time   COLORURINE YELLOW 06/09/2020 2055   APPEARANCEUR HAZY (A) 06/09/2020 2055   LABSPEC 1.028 06/09/2020 2055   PHURINE 5.0 06/09/2020 2055   GLUCOSEU 150 (A) 06/09/2020 2055   HGBUR NEGATIVE 06/09/2020 2055   BILIRUBINUR NEGATIVE 06/09/2020 2055   KETONESUR 5 (A) 06/09/2020 2055   PROTEINUR 30 (A) 06/09/2020 2055   NITRITE NEGATIVE 06/09/2020 2055   LEUKOCYTESUR NEGATIVE 06/09/2020 2055   Sepsis Labs: @LABRCNTIP (procalcitonin:4,lacticidven:4) ) Recent Results (from the past 240 hour(s))  Respiratory Panel by RT PCR (Flu A&B, Covid) - Nasopharyngeal Swab     Status: None   Collection Time: 06/09/20  8:55 PM   Specimen: Nasopharyngeal Swab  Result Value Ref Range Status   SARS Coronavirus 2 by RT PCR NEGATIVE NEGATIVE Final    Comment: (NOTE) SARS-CoV-2 target nucleic acids are NOT DETECTED.  The SARS-CoV-2 RNA is generally detectable in upper respiratoy specimens during the acute phase of infection. The lowest concentration of SARS-CoV-2 viral copies this assay can detect is 131 copies/mL. A negative result does not preclude  SARS-Cov-2 infection and should not be used as the sole basis for treatment or other patient management decisions. A negative result may occur with  improper specimen collection/handling, submission of specimen other than nasopharyngeal swab, presence of viral mutation(s) within the areas targeted by this assay, and inadequate number of viral copies (<131 copies/mL). A negative result must be combined with clinical observations, patient history, and epidemiological information. The expected result is Negative.  Fact Sheet for Patients:  PinkCheek.be  Fact Sheet for Healthcare Providers:  GravelBags.it  This test is no t yet approved or cleared by the [place] FDA and  has been authorized for detection and/or diagnosis of SARS-CoV-2 by FDA under an Emergency Use Authorization (EUA). This EUA will remain  in effect (meaning this test can be used) for the duration of the COVID-19 declaration under Section 564(b)(1) of the Act, 21 U.S.C. section 360bbb-3(b)(1), unless the authorization is terminated  or revoked sooner.     Influenza A by PCR NEGATIVE NEGATIVE Final   Influenza B by PCR NEGATIVE NEGATIVE Final    Comment: (NOTE) The Xpert Xpress SARS-CoV-2/FLU/RSV assay is intended as an aid in  the diagnosis of influenza from Nasopharyngeal swab specimens and  should not be used as a sole basis for treatment. Nasal washings and  aspirates are unacceptable for Xpert Xpress SARS-CoV-2/FLU/RSV  testing.  Fact Sheet for Patients: PinkCheek.be  Fact Sheet for Healthcare Providers: GravelBags.it  This test is not yet approved or cleared by the [place] FDA and  has been authorized for detection and/or diagnosis of SARS-CoV-2 by  FDA under an Emergency Use Authorization (EUA). This EUA will remain  in effect (meaning this test can be used) for the duration of the   Covid-19 declaration under Section 564(b)(1) of the Act, 21  U.S.C. section 360bbb-3(b)(1), unless the authorization is  terminated or revoked. Performed at [facility], [place] [address], [place], [place] [address]      Radiological Exams on Admission: DG Chest 1 View  Result Date: 06/10/2020 CLINICAL DATA:  Altered mental status EXAM: CHEST  1 VIEW COMPARISON:  None. FINDINGS: Benign calcified granuloma noted at the left lung base. Lungs are otherwise clear. No pneumothorax or pleural effusion. Cardiac size within normal limits. Pulmonary vascularity is normal. No acute bone abnormality. IMPRESSION: No active disease. Electronically Signed   By: Fidela Salisbury MD   On: 06/10/2020 01:06   DG Abdomen 1 View  Result Date: 06/10/2020 CLINICAL DATA:  MRI clearance EXAM: ABDOMEN - 1 VIEW COMPARISON:  None. FINDINGS: The bowel gas pattern is normal. No radio-opaque calculi or other significant radiographic abnormality are seen. No metallic foreign body. IMPRESSION: No metallic foreign body. Nonobstructive bowel gas pattern. Electronically Signed   By: Ulyses Jarred M.D.   On: 06/10/2020 01:07   CT HEAD WO CONTRAST  Addendum Date: 06/09/2020   ADDENDUM REPORT: 06/09/2020 22:24 ADDENDUM: These results were called by telephone at the time of interpretation on 06/09/2020 at 10:13 pm to provider Dr. Alvino Chapel, who verbally acknowledged these results. Electronically Signed   By: Primitivo Gauze M.D.   On: 06/09/2020 22:24   Result Date: 06/09/2020 CLINICAL DATA:  Mental status change, unknown cause. EXAM: CT HEAD WITHOUT CONTRAST TECHNIQUE: Contiguous axial images were obtained from the base of the skull through the vertex without intravenous contrast. COMPARISON:  12/18/2019 MRI/MRA head.  12/12/2019 head CT. FINDINGS: Brain: Redemonstration of infiltrative hypodensity involving the left temporal lobe. Partial effacement of the left lateral ventricle, increased when compared to prior MRI.  Abutment of the left midbrain by the mesial left temporal lobe appears more conspicuous. New rightward midline shift of 4 mm. No ventriculomegaly or extra-axial fluid collection. No intracranial hemorrhage. Vascular: No hyperdense vessel or unexpected calcification. Skull: No acute finding. Sinuses/Orbits: Normal orbits. Clear paranasal sinuses. No mastoid effusion. Other: None. IMPRESSION: Redemonstration of infiltrative left temporal lesion. Increased partial effacement of the left lateral ventricle and abutment of the left midbrain. New rightward midline shift of 4 mm. MRI head with and without contrast is recommended for better evaluation. Electronically Signed: By: Primitivo Gauze M.D. On: 06/09/2020 22:06    EKG: Independently reviewed.  Sinus tachycardia.  Assessment/Plan Active Problems:   Brain mass   Hyponatremia    1. Infiltrative left temporal brain lesion for which neurosurgery has been consulted.  An MRI brain with and without contrast has been ordered.  We will need to  get a CT chest abdomen pelvis for possible metastatic work-up.  Further recommendations per neurosurgery. 2. Possible seizures for which I discussed with on-call neurologist Dr. Theda Sers who at this time advised EEG no present indication for any antiepileptics. 3. Hyponatremia -cause not clear.  Will check urine sodium osmolality serum osmolality and frequent metabolic panels to have further plans. 4. Mental status changes likely could be from the infiltrative lesions versus possible postictal status versus metabolic reasons including electrolyte changes.  Patient does not look like to be having any active seizures since patient is following commands.  Given that patient has infiltrative brain lesion which will need further work-up including hyponatremia will need close monitoring for any further worsening in inpatient status.   DVT prophylaxis: SCDs.  Avoiding anticoagulation anticipation of possible  procedures. Code Status: Full code. Family Communication: Patient's wife. Disposition Plan: To be determined. Consults called: Neurosurgery.  Discussed with neurologist. Admission status: Inpatient.   Rise Patience MD [facility] Pager [phone].  If 7PM-7AM, please contact night-coverage [email or web address] Password [email or web address]  06/10/2020, 3:12 AM

## 2020-06-10 NOTE — Progress Notes (Addendum)
Patient now on low bed with floor mats and side rail protectors in place.  Continues to have episodes of confusion and lip smacking with impulse control issues and attempting to get OOB without assist.  1800:  Pt refusing to take po meds despite encouragement to do so.  Unable to follow instructions to take medications and spits them out, despite several attempts.  MD notified.

## 2020-06-11 DIAGNOSIS — G40901 Epilepsy, unspecified, not intractable, with status epilepticus: Secondary | ICD-10-CM

## 2020-06-11 DIAGNOSIS — G9341 Metabolic encephalopathy: Secondary | ICD-10-CM

## 2020-06-11 DIAGNOSIS — R569 Unspecified convulsions: Secondary | ICD-10-CM

## 2020-06-11 LAB — SODIUM
Sodium: 129 mmol/L — ABNORMAL LOW (ref 135–145)
Sodium: 129 mmol/L — ABNORMAL LOW (ref 135–145)

## 2020-06-11 LAB — PHENYTOIN LEVEL, TOTAL: Phenytoin Lvl: 19.1 ug/mL (ref 10.0–20.0)

## 2020-06-11 LAB — MAGNESIUM: Magnesium: 2 mg/dL (ref 1.7–2.4)

## 2020-06-11 MED ORDER — LACOSAMIDE 50 MG PO TABS
100.0000 mg | ORAL_TABLET | Freq: Once | ORAL | Status: AC
  Administered 2020-06-11: 100 mg via ORAL
  Filled 2020-06-11: qty 2

## 2020-06-11 MED ORDER — CLONAZEPAM 0.5 MG PO TABS
1.0000 mg | ORAL_TABLET | Freq: Three times a day (TID) | ORAL | Status: DC
  Administered 2020-06-11 (×2): 1 mg via ORAL
  Filled 2020-06-11 (×2): qty 2

## 2020-06-11 MED ORDER — CLONAZEPAM 1 MG PO TABS
2.0000 mg | ORAL_TABLET | Freq: Three times a day (TID) | ORAL | Status: DC
  Administered 2020-06-11 – 2020-06-14 (×10): 2 mg via ORAL
  Filled 2020-06-11 (×10): qty 4

## 2020-06-11 MED ORDER — FUROSEMIDE 20 MG PO TABS
20.0000 mg | ORAL_TABLET | Freq: Two times a day (BID) | ORAL | Status: DC
  Administered 2020-06-11 – 2020-06-12 (×3): 20 mg via ORAL
  Filled 2020-06-11 (×3): qty 1

## 2020-06-11 MED ORDER — LORAZEPAM 2 MG/ML IJ SOLN
1.0000 mg | Freq: Once | INTRAMUSCULAR | Status: AC
  Administered 2020-06-11: 1 mg via INTRAVENOUS
  Filled 2020-06-11: qty 1

## 2020-06-11 MED ORDER — SODIUM CHLORIDE 0.9 % IV SOLN
200.0000 mg | Freq: Two times a day (BID) | INTRAVENOUS | Status: DC
  Administered 2020-06-11 – 2020-06-13 (×4): 200 mg via INTRAVENOUS
  Filled 2020-06-11 (×7): qty 20

## 2020-06-11 NOTE — Procedures (Signed)
Patient Name: [name]  MRN: [id number]  Epilepsy Attending: Lora Havens  Referring Physician/Provider: Solon Augusta, PA Duration: 06/10/2020 1205 to 06/11/2020 1205  Patient history: 59yo M with ams. EEG to evaluate for seizure.  Level of alertness: Awake, asleep  AEDs during EEG study: LEV PHT  Technical aspects: This EEG study was done with scalp electrodes positioned according to the 10-20 International system of electrode placement. Electrical activity was acquired at a sampling rate of 500Hz  and reviewed with a high frequency filter of 70Hz  and a low frequency filter of 1Hz . EEG data were recorded continuously and digitally stored.   Description: The posterior dominant rhythm consists of 9 Hz activity of moderate voltage (25-35 uV) seen predominantly in posterior head regions, symmetric and reactive to eye opening and eye closing.  Sleep was characterized by vertex waves, sleep spindles (12 to 14 Hz), maximal frontocentral region.  EEG showed continuous 3 to 6 Hz theta-delta slowing in left frontotemporal reion. Seizures  were noted, avg 5/hour, lasting 1-1.5 minutes, arising fro left frontotemporal region. During most seizures, patient was either asleep or laying in bed and therefore no clinica signs were noted. Hyperventilation and photic stimulation were not performed.     ABNORMALITY -Seizures, left frontemporal region -Continuous slow, left frontotemporal region  IMPRESSION: This study showed seizures without clear clinical signs, avg 5/hour, lasting 1-1.5 minutes, arising from left frontotemporal region. There is also cortical dysfunction in left frontotemporal region consistent with underlying mass.    Oziel Beitler Barbra Sarks

## 2020-06-11 NOTE — Progress Notes (Addendum)
PROGRESS NOTE    Hunter Terry   YQM:[id number]  DOB: 1960-11-14  DOA: 06/09/2020 PCP: Patient, No Pcp Per   Brief Narrative:  Hunter Terry  is a 59 y.o. male with with history of having been diagnosed with possible seizures in May 2021 at that time MRI brain done which showed possible left temporal infiltrative process for which patient did not seek any further medical advice is a truck driver for [employer] and was driving back from [place] to [place] when patient was found to be increasingly confused by patient's colleagues at workplace and since truck has a camera on the [employer] nurse was trying to communicate with the patient was found to be not following the commands and remotely the truck was stopped as per the report.  Patient was brought to the ER.  Per patient's wife patient waited talking to her last night had about half hour appeared confused and talking nonsensical things before becoming normal.  6 months ago patient had waking up from sleep with incontinence of urine and tongue bite at that time there was some concern for seizure since patient's grandfather also had similar symptoms with seizures.  That prompted patient to be taken to the ER had an MRI done which showed left temporal lesion for which further medical advice was not sought.  In ED > CT of the head shows redemonstration of the infiltrative left temporal lesion increased partial effacement of the lateral ventricle and abutment of the left midbrain with midline shift of 4 mm.  Subjective: Has no complaints.     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 If due to mass vs due to postictal state from seizures in addition to hyponatremia - confused to time and place today - RN states he is agitated and they are unable to find a sitter and is requesting restraints  Time spent in minutes: 35 DVT prophylaxis: SCDs Start: 06/10/20 0309  Code Status: Full code Family Communication:  Disposition Plan:  Status is: Inpatient  Remains inpatient appropriate because:brain mass with confusion, seizures and hyponatremia   Dispo: The patient is from: Home              Anticipated d/c is to: TBD              Anticipated d/c date is: > 3 days              Patient currently is not medically stable to d/c.  Consultants:   NS  Neurology  Renal Procedures:   EEG Antimicrobials:  Anti-infectives (From admission, onward)   Start     Dose/Rate Route Frequency Ordered Stop   06/11/20 0000  demeclocycline (DECLOMYCIN) tablet 300 mg  Status:  Discontinued        300 mg Per NG tube Every 6 hours 06/10/20 1952 06/10/20 2004   06/11/20 0000  demeclocycline (DECLOMYCIN) tablet 300 mg        300 mg Oral Every 6 hours 06/10/20 2004     06/10/20 1200  demeclocycline (DECLOMYCIN) tablet 300 mg  Status:  Discontinued        300 mg Oral Every 6 hours 06/10/20 1018 06/10/20 1952       Objective: Vitals:   06/10/20 1957 06/11/20 0008 06/11/20 0424 06/11/20 0904  BP: 100/73 103/65 107/61 116/72  Pulse: 71 70 67 76  Resp: 18 14 17 18   Temp: 98 F (36.7 C) 98.6 F (37 C) 98.2 F (36.8 C) 99.3 F (37.4 C)   TempSrc:    Oral  SpO2: 100% 100% 99% 98%  Weight:      Height:        Intake/Output Summary (Last 24 hours) at 06/11/2020 1222 Last data filed at 06/11/2020 1100 Gross per 24 hour  Intake 250 ml  Output 3100 ml  Net -2850 ml   Filed Weights   06/09/20 2013 06/10/20 1300  Weight: 72.6 kg 72.5 kg    Examination: General exam: Appears comfortable-  quite sleepy - awakens to answer questions but keeps eyes closed HEENT: PERRL Respiratory system: Clear to auscultation. Respiratory effort normal. Cardiovascular system: S1 & S2 heard, RRR.   Gastrointestinal system: Abdomen soft, non-tender, nondistended. Normal bowel sounds. Central nervous system: confused to time and place, moves extremities to command, normal tone Extremities: No cyanosis, clubbing or edema Skin: No rashes or ulcers Psychiatry:  Cannot assess    Data Reviewed: I have personally reviewed following labs and imaging studies  CBC: Recent Labs  Lab 06/09/20 2055 06/09/20 2111 06/10/20 0309  WBC 9.1  --  9.6  NEUTROABS 7.7  --  7.5  HGB 14.9 14.6 13.1  HCT 43.6 43.0 38.2*  MCV 82.1  --  83.4  PLT 307  --  941   Basic Metabolic Panel: Recent Labs  Lab 06/09/20 2055 06/09/20 2055 06/09/20 2111 06/09/20 2111 06/10/20 0309 06/10/20 0309 06/10/20 0727 06/10/20 1337 06/10/20 2317 06/11/20 0433 06/11/20 0842  NA 126*   < > 126*   < > 125*   < > 122* 125* 126* 129* 129*  K 3.8  --  3.9  --  3.6  --  3.5 4.0  --   --   --   CL 90*  --  92*  --  92*  --  88* 90*  --   --   --   CO2 25  --   --   --  24  --  25 27  --   --   --   GLUCOSE 142*  --  138*  --  121*  --  122* 118*  --   --   --   BUN 12  --  13  --  9  --  8 8  --   --   --   CREATININE 0.78  --  0.60*  --  0.70  --  0.70 0.71  --   --   --   CALCIUM 8.9  --   --   --  8.3*  --  8.4* 8.6*  --   --   --    < > = values in this interval not displayed.   GFR: Estimated Creatinine Clearance: 86.5 mL/min (  by C-G formula based on SCr of  0.71 mg/dL). Liver Function Tests: Recent Labs  Lab 06/09/20 2055  AST 30  ALT 23  ALKPHOS 55  BILITOT 1.0  PROT 7.7  ALBUMIN 4.5   No results for input(s): LIPASE, AMYLASE in the last 168 hours. No results for input(s): AMMONIA in the last 168 hours. Coagulation Profile: Recent Labs  Lab 06/09/20 2055  INR 1.0   Cardiac Enzymes: No results for input(s): CKTOTAL, CKMB, CKMBINDEX, TROPONINI in the last 168 hours. BNP (last 3 results) No results for input(s): PROBNP in the last 8760 hours. HbA1C: No results for input(s): HGBA1C in the last 72 hours. CBG: Recent Labs  Lab 06/09/20 2019  GLUCAP 129*   Lipid Profile: No results for input(s): CHOL, HDL, LDLCALC, TRIG, CHOLHDL, LDLDIRECT in the last 72 hours. Thyroid Function Tests: Recent Labs    06/10/20 0309  TSH 1.578   Anemia Panel: No results for input(s): VITAMINB12, FOLATE, FERRITIN, TIBC, IRON, RETICCTPCT in the last 72 hours. Urine analysis:    Component Value Date/Time   COLORURINE YELLOW 06/09/2020 2055   APPEARANCEUR HAZY (A) 06/09/2020 2055   LABSPEC 1.028 06/09/2020 2055   PHURINE 5.0 06/09/2020 2055   GLUCOSEU 150 (A) 06/09/2020 2055   HGBUR NEGATIVE 06/09/2020 2055   BILIRUBINUR NEGATIVE 06/09/2020 2055   KETONESUR 5 (A) 06/09/2020 2055   PROTEINUR 30 (A) 06/09/2020 2055   NITRITE NEGATIVE 06/09/2020 2055   LEUKOCYTESUR NEGATIVE 06/09/2020 2055   Sepsis Labs: @LABRCNTIP (procalcitonin:4,lacticidven:4) ) Recent Results (from the past 240 hour(s))  Respiratory Panel by RT PCR (Flu A&B, Covid) - Nasopharyngeal Swab     Status: None   Collection Time: 06/09/20  8:55 PM   Specimen: Nasopharyngeal Swab  Result Value Ref Range Status   SARS Coronavirus 2 by RT PCR NEGATIVE NEGATIVE Final    Comment: (NOTE) SARS-CoV-2 target nucleic acids are NOT DETECTED.  The SARS-CoV-2 RNA is generally detectable in upper respiratoy specimens during the acute phase of infection. The lowest concentration of  SARS-CoV-2 viral copies this assay can detect is 131 copies/mL. A negative result does not preclude SARS-Cov-2 infection and should not be used as the sole basis for treatment or other patient management decisions. A negative result may occur with  improper specimen collection/handling, submission of specimen other than nasopharyngeal swab, presence of viral mutation(s) within the areas targeted by this assay, and inadequate number of viral copies (<131 copies/mL). A negative result must be combined with clinical observations, patient history, and epidemiological information. The expected result is Negative.  Fact Sheet for Patients:  PinkCheek.be  Fact Sheet for Healthcare Providers:  GravelBags.it  This test is no t yet approved or cleared by the [place] FDA and  has been authorized for detection and/or diagnosis of SARS-CoV-2 by FDA under an Emergency Use Authorization (EUA). This EUA will remain  in effect (meaning this test can be used) for the duration of the COVID-19 declaration under Section 564(b)(1) of the Act, 21 U.S.C. section 360bbb-3(b)(1), unless the authorization is terminated or revoked sooner.     Influenza A by PCR NEGATIVE NEGATIVE Final   Influenza B by PCR NEGATIVE NEGATIVE Final    Comment: (NOTE) The Xpert Xpress SARS-CoV-2/FLU/RSV assay is intended as an aid in  the diagnosis of influenza from Nasopharyngeal swab specimens and  should not be used as a sole basis for treatment. Nasal washings and  aspirates are unacceptable for Xpert Xpress SARS-CoV-2/FLU/RSV  testing.  Fact Sheet for Patients: PinkCheek.be  Fact Sheet for Healthcare Providers: GravelBags.it  This test is not yet approved or cleared by the [place] FDA and  has been authorized for detection and/or diagnosis of SARS-CoV-2 by  FDA under an Emergency Use  Authorization (EUA). This EUA will remain  in effect (meaning this test can be used) for the duration of the  Covid-19 declaration under Section 564(b)(1) of the Act, 21  U.S.C. section 360bbb-3(b)(1), unless the authorization is  terminated or revoked. Performed at [facility], [place] [address] [place], [place] [address]          Radiology Studies: EEG  Result Date: 06/10/2020 Greta Doom, MD     06/10/2020 10:25 AM History: 59 year old male with no mass presented with altered mental status Sedation: None Technique: This is a 21 channel routine scalp EEG performed at the bedside with bipolar and monopolar montages arranged in accordance to the international 10/20 system of electrode placement. One channel was dedicated to EKG recording. Background: The background consists of intermixed alpha and beta activities. There is a well defined posterior dominant rhythm of 9 hz that attenuates with eye opening.  There is focal left frontotemporal slowing.  He had three seizures lasting 2:07, 2:42 and 1:27 with poorly localized left hemispheric onset. No definite clinical correlate was seen, though with the first there is possibly some liip smacking, but not clear on video due to darkening for photic stimulation. Photic stimulation and HV were performed. EEG Abnormalities: 1) Three focal left hemispheric seizures. As above. 2) Left frontotemporal seizures Clinical Interpretation: This EEG recorded three discrete focal seizures arising from the left hemisphere with no definite clinical correlate. Roland Rack, MD [facility] [phone] If 7pm- 7am, please page neurology on call as listed in [place].   DG Chest 1 View  Result Date: 06/10/2020 CLINICAL DATA:  Altered mental status EXAM: CHEST  1 VIEW COMPARISON:  None. FINDINGS: Benign calcified granuloma noted at the left lung base. Lungs are otherwise clear. No pneumothorax or pleural effusion. Cardiac size within  normal limits. Pulmonary vascularity is normal. No acute bone abnormality. IMPRESSION: No active disease. Electronically Signed   By: Fidela Salisbury MD   On: 06/10/2020 01:06   DG Abdomen 1 View  Result Date: 06/10/2020 CLINICAL DATA:  MRI clearance EXAM: ABDOMEN - 1 VIEW COMPARISON:  None. FINDINGS: The bowel gas pattern is normal. No radio-opaque calculi or other significant radiographic abnormality are seen. No metallic foreign body. IMPRESSION: No metallic foreign body. Nonobstructive bowel gas pattern. Electronically Signed   By: Ulyses Jarred M.D.   On: 06/10/2020 01:07   CT HEAD WO CONTRAST  Addendum Date: 06/09/2020   ADDENDUM REPORT: 06/09/2020 22:24 ADDENDUM: These results were called by telephone at the time of interpretation on 06/09/2020 at 10:13 pm to provider Dr. Alvino Chapel, who verbally acknowledged these results. Electronically Signed   By: Primitivo Gauze M.D.   On: 06/09/2020 22:24   Result Date: 06/09/2020 CLINICAL DATA:  Mental status change, unknown cause. EXAM: CT HEAD WITHOUT CONTRAST TECHNIQUE: Contiguous axial images were obtained from the base of the skull through the vertex without intravenous contrast. COMPARISON:  12/18/2019 MRI/MRA head.  12/12/2019 head CT. FINDINGS: Brain: Redemonstration of infiltrative hypodensity involving the left temporal lobe. Partial effacement of the left lateral ventricle, increased when compared to prior MRI. Abutment of the left midbrain by the mesial left temporal lobe appears more conspicuous. New rightward midline shift of 4 mm. No ventriculomegaly or extra-axial fluid collection. No intracranial hemorrhage.  Vascular: No hyperdense vessel or unexpected calcification. Skull: No acute finding. Sinuses/Orbits: Normal orbits. Clear paranasal sinuses. No mastoid effusion. Other: None. IMPRESSION: Redemonstration of infiltrative left temporal lesion. Increased partial effacement of the left lateral ventricle and abutment of the left midbrain.  New rightward midline shift of 4 mm. MRI head with and without contrast is recommended for better evaluation. Electronically Signed: By: Primitivo Gauze M.D. On: 06/09/2020 22:06   CT CHEST W CONTRAST  Result Date: 06/10/2020 CLINICAL DATA:  Brain/CNS neoplasm. Evaluate for metastatic disease/primary neoplasm. EXAM: CT CHEST, ABDOMEN, AND PELVIS WITH CONTRAST TECHNIQUE: Multidetector CT imaging of the chest, abdomen and pelvis was performed following the standard protocol during bolus administration of intravenous contrast. CONTRAST:  121mL OMNIPAQUE IOHEXOL 300 MG/ML  SOLN COMPARISON:  Brain MRI, 06/10/2020. Abdomen radiographs, 06/10/2020. FINDINGS: CT CHEST FINDINGS Cardiovascular: Heart normal in size and configuration. No pericardial effusion. No coronary artery calcifications. Normal great vessels and widely patent aortic arch branch vessels. Mediastinum/Nodes: Normal thyroid. No neck base, axillary, mediastinal or hilar masses or enlarged lymph nodes. Normal trachea and esophagus. Lungs/Pleura: Minor dependent atelectasis. Calcified granuloma in the left upper lobe lingula. No evidence of pneumonia or pulmonary edema. No lung mass or suspicious nodule. No pleural effusion or pneumothorax. Musculoskeletal: No fracture or bone lesion. No significant skeletal abnormality. No chest wall mass. CT ABDOMEN PELVIS FINDINGS Hepatobiliary: No focal liver abnormality is seen. No gallstones, gallbladder wall thickening, or biliary dilatation. Pancreas: Unremarkable. No pancreatic ductal dilatation or surrounding inflammatory changes. Spleen: Normal in size without focal abnormality. Adrenals/Urinary Tract: Normal adrenal glands. Kidneys normal in size, orientation and position with symmetric enhancement and excretion. No renal masses. No hydronephrosis. Normal ureters. Bladder unremarkable. Stomach/Bowel: Stomach is within normal limits. Appendix appears normal. No evidence of bowel wall thickening, distention,  or inflammatory changes. Vascular/Lymphatic: No significant vascular findings are present. No enlarged abdominal or pelvic lymph nodes. Reproductive: Prostate mildly enlarged, 4.8 x 3.5 cm transversely. Other: No abdominal wall hernia or abnormality. No abdominopelvic ascites. Musculoskeletal: Chronic bilateral pars defects at L5-S1. Minimal anterolisthesis. Skeletal structures otherwise unremarkable. IMPRESSION: 1. No evidence of a primary malignancy or metastatic disease within the chest, abdomen or pelvis. 2. No acute findings within the chest, abdomen or pelvis. Electronically Signed   By: Lajean Manes M.D.   On: 06/10/2020 09:52   MR Brain W and Wo Contrast  Result Date: 06/10/2020 CLINICAL DATA:  Brain mass EXAM: MRI HEAD WITHOUT AND WITH CONTRAST TECHNIQUE: Multiplanar, multiecho pulse sequences of the brain and surrounding structures were obtained without and with intravenous contrast. CONTRAST:  7.57mL GADAVIST GADOBUTROL 1 MMOL/ML IV SOLN COMPARISON:  Brain MRI 12/18/2019 FINDINGS: Brain: No acute infarct, acute hemorrhage or extra-axial collection. Slight worsening of abnormal hyperintense T2-weighted signal within the left temporal lobe. Mass effect on the left lateral ventricle and brainstem is slightly increased. There is no contrast enhancement within the lesion. No chronic microhemorrhage. Normal midline structures. Vascular: Normal flow voids. Skull and upper cervical spine: Normal marrow signal. Sinuses/Orbits: Negative. Other: None. 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 Electronically Signed   By: Ulyses Jarred M.D.   On: 06/10/2020 03:12   CT ABDOMEN PELVIS W CONTRAST  Result Date: 06/10/2020 CLINICAL DATA:  Brain/CNS neoplasm. Evaluate for metastatic disease/primary neoplasm. EXAM: CT CHEST, ABDOMEN, AND PELVIS WITH CONTRAST TECHNIQUE: Multidetector CT imaging of  the chest, abdomen and pelvis was performed following  the standard protocol during bolus administration of intravenous contrast. CONTRAST:  147mL OMNIPAQUE IOHEXOL 300 MG/ML  SOLN COMPARISON:  Brain MRI, 06/10/2020. Abdomen radiographs, 06/10/2020. FINDINGS: CT CHEST FINDINGS Cardiovascular: Heart normal in size and configuration. No pericardial effusion. No coronary artery calcifications. Normal great vessels and widely patent aortic arch branch vessels. Mediastinum/Nodes: Normal thyroid. No neck base, axillary, mediastinal or hilar masses or enlarged lymph nodes. Normal trachea and esophagus. Lungs/Pleura: Minor dependent atelectasis. Calcified granuloma in the left upper lobe lingula. No evidence of pneumonia or pulmonary edema. No lung mass or suspicious nodule. No pleural effusion or pneumothorax. Musculoskeletal: No fracture or bone lesion. No significant skeletal abnormality. No chest wall mass. CT ABDOMEN PELVIS FINDINGS Hepatobiliary: No focal liver abnormality is seen. No gallstones, gallbladder wall thickening, or biliary dilatation. Pancreas: Unremarkable. No pancreatic ductal dilatation or surrounding inflammatory changes. Spleen: Normal in size without focal abnormality. Adrenals/Urinary Tract: Normal adrenal glands. Kidneys normal in size, orientation and position with symmetric enhancement and excretion. No renal masses. No hydronephrosis. Normal ureters. Bladder unremarkable. Stomach/Bowel: Stomach is within normal limits. Appendix appears normal. No evidence of bowel wall thickening, distention, or inflammatory changes. Vascular/Lymphatic: No significant vascular findings are present. No enlarged abdominal or pelvic lymph nodes. Reproductive: Prostate mildly enlarged, 4.8 x 3.5 cm transversely. Other: No abdominal wall hernia or abnormality. No abdominopelvic ascites. Musculoskeletal: Chronic bilateral pars defects at L5-S1. Minimal anterolisthesis. Skeletal structures otherwise unremarkable.  IMPRESSION: 1. No evidence of a primary malignancy or metastatic disease within the chest, abdomen or pelvis. 2. No acute findings within the chest, abdomen or pelvis. Electronically Signed   By: Lajean Manes M.D.   On: 06/10/2020 09:52      Scheduled Meds: . clonazePAM  1 mg Oral TID  . demeclocycline  300 mg Oral Q6H  . furosemide  20 mg Oral BID  . lacosamide  100 mg Oral Once  . phenytoin (DILANTIN) IV  100 mg Intravenous Q8H   Continuous Infusions: . lacosamide (VIMPAT) IV    . levETIRAcetam 1,500 mg (06/11/20 0438)     LOS: 1 day      Debbe Odea, MD [facility] Pager: [email or web address] 06/11/2020, 12:22 PM

## 2020-06-11 NOTE — Progress Notes (Signed)
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  Brain mass - full body imaging does not show any primary focus of malignancy.  Neurosurgery is concerned this could represent a glioma.  Continued work-up via appropriate services 2.  Seizures-I feel that the brain mass as opposed to the hyponatremia is probably responsible for the seizure issue.  Neurology on board, started on [place] 3.  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  In this case especially would not want to correct sodium too quickly.  Seems euvolemic.   giving low-dose Lasix as well as demeclocycline to dilute urine and checking sodium frequently.  Would not use 3% saline in this situation.  So far with appropriately slow correction and no need for samsca.  no changes today -  Decrease na checks to q 12 hours     Louis Meckel    Labs: Basic Metabolic Panel: Recent Labs  Lab 06/10/20 0309 06/10/20 0309 06/10/20 0727 06/10/20 0727 06/10/20 1337 06/10/20 2317 06/11/20 0433  NA 125*   < > 122*   < > 125* 126* 129*  K 3.6  --  3.5  --  4.0  --   --   CL  92*  --  88*  --  90*  --   --   CO2 24  --  25  --  27  --   --   GLUCOSE 121*  --  122*  --  118*  --   --   BUN 9  --  8  --  8  --   --   CREATININE 0.70  --  0.70  --  0.71  --   --   CALCIUM 8.3*  --  8.4*  --  8.6*  --   --    < > = values in this interval not displayed.   Liver Function Tests: Recent Labs  Lab 06/09/20 2055  AST 30  ALT 23  ALKPHOS 55  BILITOT 1.0  PROT 7.7  ALBUMIN 4.5   No results for input(s): LIPASE, AMYLASE in the last 168 hours. No results for input(s): AMMONIA in the last 168 hours. CBC: Recent Labs  Lab 06/09/20 2055 06/09/20 2111 06/10/20 0309  WBC 9.1  --  9.6  NEUTROABS 7.7  --  7.5  HGB 14.9 14.6 13.1  HCT 43.6 43.0 38.2*  MCV 82.1  --  83.4  PLT 307  --  283  Cardiac Enzymes: No results for input(s): CKTOTAL, CKMB, CKMBINDEX, TROPONINI in the last 168 hours. CBG: Recent Labs  Lab 06/09/20 2019  GLUCAP 129*    Iron Studies: No results for input(s): IRON, TIBC, TRANSFERRIN, FERRITIN in the last 72 hours. Studies/Results: EEG  Result Date: 06/10/2020 Greta Doom, MD     06/10/2020 10:25 AM History: 59 year old male with no mass presented with altered mental status Sedation: None Technique: This is a 21 channel routine scalp EEG performed at the bedside with bipolar and monopolar montages arranged in accordance to the international 10/20 system of electrode placement. One channel was dedicated to EKG recording. Background: The background consists of intermixed alpha and beta activities. There is a well defined posterior dominant rhythm of 9 hz that attenuates with eye opening.  There is focal left frontotemporal slowing.  He had three seizures lasting 2:07, 2:42 and 1:27 with poorly localized left hemispheric onset. No definite clinical correlate was seen, though with the first there is possibly some liip smacking, but not clear on video due to darkening for photic stimulation. Photic stimulation and HV were performed. EEG  Abnormalities: 1) Three focal left hemispheric seizures. As above. 2) Left frontotemporal seizures Clinical Interpretation: This EEG recorded three discrete focal seizures arising from the left hemisphere with no definite clinical correlate. Roland Rack, MD [facility] [phone] If 7pm- 7am, please page neurology on call as listed in [place].   DG Chest 1 View  Result Date: 06/10/2020 CLINICAL DATA:  Altered mental status EXAM: CHEST  1 VIEW COMPARISON:  None. FINDINGS: Benign calcified granuloma noted at the left lung base. Lungs are otherwise clear. No pneumothorax or pleural effusion. Cardiac size within normal limits. Pulmonary vascularity is normal. No acute bone abnormality. IMPRESSION: No active disease. Electronically Signed   By: Fidela Salisbury MD   On: 06/10/2020 01:06   DG Abdomen 1 View  Result Date: 06/10/2020 CLINICAL DATA:  MRI clearance EXAM: ABDOMEN - 1 VIEW COMPARISON:  None. FINDINGS: The bowel gas pattern is normal. No radio-opaque calculi or other significant radiographic abnormality are seen. No metallic foreign body. IMPRESSION: No metallic foreign body. Nonobstructive bowel gas pattern. Electronically Signed   By: Ulyses Jarred M.D.   On: 06/10/2020 01:07   CT HEAD WO CONTRAST  Addendum Date: 06/09/2020   ADDENDUM REPORT: 06/09/2020 22:24 ADDENDUM: These results were called by telephone at the time of interpretation on 06/09/2020 at 10:13 pm to provider Dr. Alvino Chapel, who verbally acknowledged these results. Electronically Signed   By: Primitivo Gauze M.D.   On: 06/09/2020 22:24   Result Date: 06/09/2020 CLINICAL DATA:  Mental status change, unknown cause. EXAM: CT HEAD WITHOUT CONTRAST TECHNIQUE: Contiguous axial images were obtained from the base of the skull through the vertex without intravenous contrast. COMPARISON:  12/18/2019 MRI/MRA head.  12/12/2019 head CT. FINDINGS: Brain: Redemonstration of infiltrative hypodensity involving the left  temporal lobe. Partial effacement of the left lateral ventricle, increased when compared to prior MRI. Abutment of the left midbrain by the mesial left temporal lobe appears more conspicuous. New rightward midline shift of 4 mm. No ventriculomegaly or extra-axial fluid collection. No intracranial hemorrhage. Vascular: No hyperdense vessel or unexpected calcification. Skull: No acute finding. Sinuses/Orbits: Normal orbits. Clear paranasal sinuses. No mastoid effusion. Other: None. IMPRESSION: Redemonstration of infiltrative left temporal lesion. Increased partial effacement of the left lateral ventricle and abutment of the left midbrain. New rightward midline shift of 4 mm. MRI head with and without contrast is recommended for better  evaluation. Electronically Signed: By: Primitivo Gauze M.D. On: 06/09/2020 22:06   CT CHEST W CONTRAST  Result Date: 06/10/2020 CLINICAL DATA:  Brain/CNS neoplasm. Evaluate for metastatic disease/primary neoplasm. EXAM: CT CHEST, ABDOMEN, AND PELVIS WITH CONTRAST TECHNIQUE: Multidetector CT imaging of the chest, abdomen and pelvis was performed following the standard protocol during bolus administration of intravenous contrast. CONTRAST:  190mL OMNIPAQUE IOHEXOL 300 MG/ML  SOLN COMPARISON:  Brain MRI, 06/10/2020. Abdomen radiographs, 06/10/2020. FINDINGS: CT CHEST FINDINGS Cardiovascular: Heart normal in size and configuration. No pericardial effusion. No coronary artery calcifications. Normal great vessels and widely patent aortic arch branch vessels. Mediastinum/Nodes: Normal thyroid. No neck base, axillary, mediastinal or hilar masses or enlarged lymph nodes. Normal trachea and esophagus. Lungs/Pleura: Minor dependent atelectasis. Calcified granuloma in the left upper lobe lingula. No evidence of pneumonia or pulmonary edema. No lung mass or suspicious nodule. No pleural effusion or pneumothorax. Musculoskeletal: No fracture or bone lesion. No significant skeletal  abnormality. No chest wall mass. CT ABDOMEN PELVIS FINDINGS Hepatobiliary: No focal liver abnormality is seen. No gallstones, gallbladder wall thickening, or biliary dilatation. Pancreas: Unremarkable. No pancreatic ductal dilatation or surrounding inflammatory changes. Spleen: Normal in size without focal abnormality. Adrenals/Urinary Tract: Normal adrenal glands. Kidneys normal in size, orientation and position with symmetric enhancement and excretion. No renal masses. No hydronephrosis. Normal ureters. Bladder unremarkable. Stomach/Bowel: Stomach is within normal limits. Appendix appears normal. No evidence of bowel wall thickening, distention, or inflammatory changes. Vascular/Lymphatic: No significant vascular findings are present. No enlarged abdominal or pelvic lymph nodes. Reproductive: Prostate mildly enlarged, 4.8 x 3.5 cm transversely. Other: No abdominal wall hernia or abnormality. No abdominopelvic ascites. Musculoskeletal: Chronic bilateral pars defects at L5-S1. Minimal anterolisthesis. Skeletal structures otherwise unremarkable. IMPRESSION: 1. No evidence of a primary malignancy or metastatic disease within the chest, abdomen or pelvis. 2. No acute findings within the chest, abdomen or pelvis. Electronically Signed   By: Lajean Manes M.D.   On: 06/10/2020 09:52   MR Brain W and Wo Contrast  Result Date: 06/10/2020 CLINICAL DATA:  Brain mass EXAM: MRI HEAD WITHOUT AND WITH CONTRAST TECHNIQUE: Multiplanar, multiecho pulse sequences of the brain and surrounding structures were obtained without and with intravenous contrast. CONTRAST:  7.75mL GADAVIST GADOBUTROL 1 MMOL/ML IV SOLN COMPARISON:  Brain MRI 12/18/2019 FINDINGS: Brain: No acute infarct, acute hemorrhage or extra-axial collection. Slight worsening of abnormal hyperintense T2-weighted signal within the left temporal lobe. Mass effect on the left lateral ventricle and brainstem is slightly increased. There is no contrast enhancement within  the lesion. No chronic microhemorrhage. Normal midline structures. Vascular: Normal flow voids. Skull and upper cervical spine: Normal marrow signal. Sinuses/Orbits: Negative. Other: None. 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 Electronically Signed   By: Ulyses Jarred M.D.   On: 06/10/2020 03:12   CT ABDOMEN PELVIS W CONTRAST  Result Date: 06/10/2020 CLINICAL DATA:  Brain/CNS neoplasm. Evaluate for metastatic disease/primary neoplasm. EXAM: CT CHEST, ABDOMEN, AND PELVIS WITH CONTRAST TECHNIQUE: Multidetector CT imaging of the chest, abdomen and pelvis was performed following the standard protocol during bolus administration of intravenous contrast. CONTRAST:  152mL OMNIPAQUE IOHEXOL 300 MG/ML  SOLN COMPARISON:  Brain MRI, 06/10/2020. Abdomen radiographs, 06/10/2020. FINDINGS: CT CHEST FINDINGS Cardiovascular: Heart normal in size and configuration. No pericardial effusion. No coronary artery calcifications. Normal great vessels and widely patent aortic arch branch vessels. Mediastinum/Nodes: Normal thyroid. No neck base, axillary,  mediastinal or hilar masses or enlarged lymph nodes. Normal trachea and esophagus. Lungs/Pleura: Minor dependent atelectasis. Calcified granuloma in the left upper lobe lingula. No evidence of pneumonia or pulmonary edema. No lung mass or suspicious nodule. No pleural effusion or pneumothorax. Musculoskeletal: No fracture or bone lesion. No significant skeletal abnormality. No chest wall mass. CT ABDOMEN PELVIS FINDINGS Hepatobiliary: No focal liver abnormality is seen. No gallstones, gallbladder wall thickening, or biliary dilatation. Pancreas: Unremarkable. No pancreatic ductal dilatation or surrounding inflammatory changes. Spleen: Normal in size without focal abnormality. Adrenals/Urinary Tract: Normal adrenal glands. Kidneys normal in size, orientation and  position with symmetric enhancement and excretion. No renal masses. No hydronephrosis. Normal ureters. Bladder unremarkable. Stomach/Bowel: Stomach is within normal limits. Appendix appears normal. No evidence of bowel wall thickening, distention, or inflammatory changes. Vascular/Lymphatic: No significant vascular findings are present. No enlarged abdominal or pelvic lymph nodes. Reproductive: Prostate mildly enlarged, 4.8 x 3.5 cm transversely. Other: No abdominal wall hernia or abnormality. No abdominopelvic ascites. Musculoskeletal: Chronic bilateral pars defects at L5-S1. Minimal anterolisthesis. Skeletal structures otherwise unremarkable. IMPRESSION: 1. No evidence of a primary malignancy or metastatic disease within the chest, abdomen or pelvis. 2. No acute findings within the chest, abdomen or pelvis. Electronically Signed   By: Lajean Manes M.D.   On: 06/10/2020 09:52   Medications: Infusions: . lacosamide (VIMPAT) IV 100 mg (06/11/20 0517)  . levETIRAcetam 1,500 mg (06/11/20 0438)    Scheduled Medications: . clonazePAM  1 mg Oral TID  . demeclocycline  300 mg Oral Q6H  . furosemide  20 mg Intravenous Q12H  . phenytoin (DILANTIN) IV  100 mg Intravenous Q8H    have reviewed scheduled and prn medications.  Physical Exam: General: speech more fluent, english is better Heart: RRR Lungs: mostly clear Abdomen: soft, non tender Extremities: no edema    06/11/2020,9:18 AM  LOS: 1 day

## 2020-06-11 NOTE — Progress Notes (Signed)
Subjective: He continues to have seizures, though his interictal periods remain at baseline.  He does not feel he has any difficulty speaking  Exam: Vitals:   06/11/20 0424 06/11/20 0904  BP: 107/61 116/72  Pulse: 67 76  Resp: 17 18  Temp: 98.2 F (36.8 C) 99.3 F (37.4 C)  SpO2: 99% 98%   Gen: In bed, NAD Resp: non-labored breathing, no acute distress Abd: soft, nt  Neuro: MS: Awake, alert, he has mild latency of speech, but I suspect this is more due to language barrier than aphasia, no difficulty with comprehension and I am able to determine CN: Pupils equal round reactive, visual fields full Motor: Moves all extremities well Sensory: Intact light touch  Pertinent Labs: Sodium 125  Impression: 59 year old male with a history of brain mass who presented with frequent recurrent seizures.  He has returned to baseline in between his seizures, however the frequency and refractory nature of them I feel essentially qualifies as status epilepticus.  With preservation of consciousness, however, I would not pursue overly aggressive measures such as burst suppression at this time.  I will optimize his current antiepileptics and add scheduled benzodiazepine.  Recommendations: 1) Dilantin 100 mg 3 times daily, check level 2) Keppra 1500 twice daily 3) increase Vimpat to 200 twice daily 4) start Klonopin 1 mg 3 times daily, if tolerating may increase 5) continue EEG  Roland Rack, MD [facility] [phone]  If 7pm- 7am, please page neurology on call as listed in [place].

## 2020-06-11 NOTE — Plan of Care (Signed)

## 2020-06-11 NOTE — Progress Notes (Signed)
LTM maint complete - no skin breakdown under:  Fp1 Fp2  F3

## 2020-06-12 ENCOUNTER — Other Ambulatory Visit: Payer: Self-pay | Admitting: Neurosurgery

## 2020-06-12 LAB — SODIUM
Sodium: 134 mmol/L — ABNORMAL LOW (ref 135–145)
Sodium: 136 mmol/L (ref 135–145)

## 2020-06-12 MED ORDER — DEXAMETHASONE 4 MG PO TABS: 4.0000 mg | ORAL_TABLET | Freq: Two times a day (BID) | ORAL | Status: DC

## 2020-06-12 MED ORDER — DEXAMETHASONE 4 MG PO TABS
4.0000 mg | ORAL_TABLET | Freq: Three times a day (TID) | ORAL | Status: DC
  Administered 2020-06-14 – 2020-06-15 (×3): 4 mg via ORAL
  Filled 2020-06-12 (×3): qty 1

## 2020-06-12 MED ORDER — DEXAMETHASONE 4 MG PO TABS: 4.0000 mg | ORAL_TABLET | Freq: Three times a day (TID) | ORAL | Status: DC

## 2020-06-12 MED ORDER — DEXAMETHASONE 4 MG PO TABS: 4.0000 mg | ORAL_TABLET | Freq: Every day | ORAL | Status: DC

## 2020-06-12 MED ORDER — DEXAMETHASONE 4 MG PO TABS
4.0000 mg | ORAL_TABLET | Freq: Four times a day (QID) | ORAL | Status: AC
  Administered 2020-06-12 – 2020-06-14 (×8): 4 mg via ORAL
  Filled 2020-06-12 (×8): qty 1

## 2020-06-12 MED ORDER — DEXAMETHASONE 4 MG PO TABS: 4.0000 mg | ORAL_TABLET | Freq: Four times a day (QID) | ORAL | Status: DC

## 2020-06-12 NOTE — Progress Notes (Signed)
LTM maint complete - no skin breakdown under: FP1,FZ,F3

## 2020-06-12 NOTE — Progress Notes (Signed)
PROGRESS NOTE    Hunter Terry   EUM:[id number]  DOB: March 10, 1961  DOA: 06/09/2020 PCP: Patient, No Pcp Per   Brief Narrative:  Hunter Terry  is a 59 y.o. male with with history of having been diagnosed with possible seizures in May 2021 at that time MRI brain done which showed possible left temporal infiltrative process for which patient did not seek any further medical advice is a truck driver for [employer] and was driving back from [place] to [place] when patient was found to be increasingly confused by patient's colleagues at workplace and since truck has a camera on the [employer] nurse was trying to communicate with the patient was found to be not following the commands and remotely the truck was stopped as per the report.  Patient was brought to the ER.  Per patient's wife patient waited talking to her last night had about half hour appeared confused and talking nonsensical things before becoming normal.  6 months ago patient had waking up from sleep with incontinence of urine and tongue bite at that time there was some concern for seizure since patient's grandfather also had similar symptoms with seizures.  That prompted patient to be taken to the ER had an MRI done which showed left temporal lesion for which further medical advice was not sought.  In ED > CT of the head shows redemonstration of the infiltrative left temporal lesion increased partial effacement of the lateral ventricle and abutment of the left midbrain with midline shift of 4 mm.  Subjective: No complaints.    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 -appreciate management by NS- plan to discuss in tumor board - CT chest/abd pelvis reveals no other concerning masses - I have had a conversation with his wife today and explained findings and  plan.  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 If due to mass vs due to postictal state from seizures in addition to hyponatremia - confused to time today -intermittently requiring restraints because he tries to get out of bed- no sitter available - today his wife states he is still not at his baseline- he is forgetful and repeating things  Time spent in minutes: 35 DVT prophylaxis: SCDs Start: 06/10/20 0309  Code Status: Full code Family Communication: wife at bedside via translator Disposition Plan:  Status is: Inpatient  Remains inpatient appropriate because:brain mass with confusion, seizures     Dispo: The patient is from: Home              Anticipated d/c is to: TBD              Anticipated d/c date is: > 3 days              Patient currently is not medically stable to d/c.  Consultants:   NS  Neurology  Renal Procedures:   EEG Antimicrobials:  Anti-infectives (From admission, onward)   Start     Dose/Rate Route Frequency Ordered Stop   06/11/20 0000  demeclocycline (DECLOMYCIN) tablet 300 mg  Status:  Discontinued        300 mg Per NG tube Every 6 hours 06/10/20 1952 06/10/20 2004   06/11/20 0000  demeclocycline (DECLOMYCIN) tablet 300 mg  Status:  Discontinued        300 mg Oral Every 6 hours 06/10/20 2004 06/12/20 1003   06/10/20 1200  demeclocycline (DECLOMYCIN) tablet 300 mg  Status:  Discontinued         300 mg Oral Every 6 hours 06/10/20 1018 06/10/20 1952       Objective: Vitals:   06/11/20 2325 06/12/20 0327 06/12/20 0803 06/12/20 1104  BP: 107/70 113/76 118/75 115/73  Pulse: 78 78 78 79  Resp: 18  16 18   Temp: 98 F (36.7 C) 97.9 F (36.6 C) 97.9 F (36.6 C) (!) 97.4 F (36.3 C)  TempSrc: Oral Oral Oral Oral  SpO2: 96% 100% 99% 97%  Weight:      Height:        Intake/Output Summary (Last 24 hours) at 06/12/2020 1555 Last data filed at 06/12/2020 1258 Gross per 24 hour  Intake 605 ml  Output 1600 ml  Net -995 ml   Filed Weights   06/09/20 2013 06/10/20 1300  Weight: 72.6 kg 72.5 kg    Examination: General exam: Appears comfortable  HEENT: PERRLA, oral mucosa moist, no sclera icterus or thrush Respiratory system: Clear to auscultation. Respiratory effort normal. Cardiovascular system: S1 & S2 heard,  No murmurs  Gastrointestinal system: Abdomen soft, non-tender, nondistended. Normal bowel sounds   Central nervous system: Alert and oriented to person and place. No focal neurological deficits. Extremities: No cyanosis, clubbing or edema Skin: No rashes or ulcers Psychiatry:  Mood & affect appropriate.    Data Reviewed: I have personally reviewed following labs and imaging studies  CBC: Recent Labs  Lab 06/09/20 2055 06/09/20 2111 06/10/20 0309  WBC 9.1  --  9.6  NEUTROABS 7.7  --  7.5  HGB 14.9 14.6 13.1  HCT 43.6 43.0 38.2*  MCV 82.1  --  83.4  PLT 307  --  440   Basic Metabolic Panel: Recent Labs  Lab 06/09/20 2055 06/09/20 2055 06/09/20 2111 06/09/20 2111 06/10/20 0309 06/10/20 0309 06/10/20 0727 06/10/20 0727 06/10/20 1337 06/10/20 1337 06/10/20 2317 06/11/20 0433 06/11/20 0842 06/11/20 1144 06/12/20 0144 06/12/20 1349  NA 126*   < > 126*   < > 125*   < > 122*   < > 125*   < > 126* 129* 129*  --  134* 136  K 3.8  --  3.9  --  3.6  --  3.5  --  4.0  --   --   --   --   --   --   --   CL 90*  --  92*  --  92*  --  88*  --  90*  --   --    --   --   --   --   --   CO2 25  --   --   --  24  --  25  --  27  --   --   --   --   --   --   --   GLUCOSE 142*  --  138*  --  121*  --  122*  --  118*  --   --   --   --   --   --   --   BUN 12  --  13  --  9  --  8  --  8  --   --   --   --   --   --   --   CREATININE 0.78  --  0.60*  --  0.70  --  0.70  --  0.71  --   --   --   --   --   --   --   CALCIUM 8.9  --   --   --  8.3*  --  8.4*  --  8.6*  --   --   --   --   --   --   --   MG  --   --   --   --   --   --   --   --   --   --   --   --   --  2.0  --   --    < > = values in this interval not displayed.   GFR: Estimated Creatinine Clearance: 86.5 mL/min (by C-G formula based on SCr of 0.71 mg/dL). Liver Function Tests: Recent Labs  Lab 06/09/20 2055  AST 30  ALT 23  ALKPHOS 55  BILITOT 1.0  PROT 7.7  ALBUMIN 4.5   No results for input(s): LIPASE, AMYLASE in the last 168 hours. No results for input(s): AMMONIA in the last 168 hours. Coagulation Profile: Recent Labs  Lab 06/09/20 2055  INR 1.0   Cardiac Enzymes: No results for input(s): CKTOTAL, CKMB, CKMBINDEX, TROPONINI in the last 168 hours. BNP (last 3 results) No results for input(s): PROBNP in the last 8760 hours. HbA1C: No results for input(s): HGBA1C in the last 72 hours. CBG: Recent Labs  Lab 06/09/20 2019  GLUCAP 129*   Lipid Profile: No results for input(s): CHOL, HDL, LDLCALC, TRIG, CHOLHDL, LDLDIRECT in the last 72 hours. Thyroid Function Tests: Recent Labs    06/10/20 0309  TSH 1.578   Anemia Panel: No results for input(s): VITAMINB12, FOLATE, FERRITIN, TIBC, IRON, RETICCTPCT in the last 72 hours. Urine analysis:    Component Value Date/Time   COLORURINE YELLOW 06/09/2020 2055   APPEARANCEUR HAZY (A) 06/09/2020 2055   LABSPEC 1.028 06/09/2020 2055   PHURINE 5.0 06/09/2020 2055   GLUCOSEU 150 (A) 06/09/2020 2055   HGBUR NEGATIVE 06/09/2020 2055   BILIRUBINUR NEGATIVE 06/09/2020 2055   KETONESUR 5 (A) 06/09/2020 2055   PROTEINUR  30 (A) 06/09/2020 2055   NITRITE NEGATIVE 06/09/2020 2055   LEUKOCYTESUR NEGATIVE 06/09/2020 2055   Sepsis Labs: @LABRCNTIP (procalcitonin:4,lacticidven:4) ) Recent Results (from the past 240 hour(s))  Respiratory Panel by RT PCR (Flu A&B, Covid) - Nasopharyngeal Swab     Status: None   Collection Time: 06/09/20  8:55 PM   Specimen: Nasopharyngeal Swab  Result Value Ref Range Status   SARS Coronavirus 2 by RT PCR NEGATIVE NEGATIVE Final    Comment: (NOTE) SARS-CoV-2 target nucleic acids are NOT DETECTED.  The SARS-CoV-2 RNA is generally detectable in upper respiratoy specimens during the acute phase of infection. The lowest concentration of SARS-CoV-2 viral copies this assay can detect is 131 copies/mL. A negative result does not preclude SARS-Cov-2 infection and should not be used as the sole basis for treatment or other  patient management decisions. A negative result may occur with  improper specimen collection/handling, submission of specimen other than nasopharyngeal swab, presence of viral mutation(s) within the areas targeted by this assay, and inadequate number of viral copies (<131 copies/mL). A negative result must be combined with clinical observations, patient history, and epidemiological information. The expected result is Negative.  Fact Sheet for Patients:  PinkCheek.be  Fact Sheet for Healthcare Providers:  GravelBags.it  This test is no t yet approved or cleared by the [place] FDA and  has been authorized for detection and/or diagnosis of SARS-CoV-2 by FDA under an Emergency Use Authorization (EUA). This EUA will remain  in effect (meaning this test can be used) for the duration of the COVID-19 declaration under Section 564(b)(1) of the Act, 21 U.S.C. section 360bbb-3(b)(1), unless the authorization is terminated or revoked sooner.     Influenza A by PCR NEGATIVE NEGATIVE Final   Influenza B by  PCR NEGATIVE NEGATIVE Final    Comment: (NOTE) The Xpert Xpress SARS-CoV-2/FLU/RSV assay is intended as an aid in  the diagnosis of influenza from Nasopharyngeal swab specimens and  should not be used as a sole basis for treatment. Nasal washings and  aspirates are unacceptable for Xpert Xpress SARS-CoV-2/FLU/RSV  testing.  Fact Sheet for Patients: PinkCheek.be  Fact Sheet for Healthcare Providers: GravelBags.it  This test is not yet approved or cleared by the [place] FDA and  has been authorized for detection and/or diagnosis of SARS-CoV-2 by  FDA under an Emergency Use Authorization (EUA). This EUA will remain  in effect (meaning this test can be used) for the duration of the  Covid-19 declaration under Section 564(b)(1) of the Act, 21  U.S.C. section 360bbb-3(b)(1), unless the authorization is  terminated or revoked. Performed at [facility], [place] [address], [place], [place] [address]          Radiology Studies: EEG  Result Date: 06/10/2020 Greta Doom, MD     06/10/2020 10:25 AM History: 59 year old male with no mass presented with altered mental status Sedation: None Technique: This is a 21 channel routine scalp EEG performed at the bedside with bipolar and monopolar montages arranged in accordance to the international 10/20 system of electrode placement. One channel was dedicated to EKG recording. Background: The background consists of intermixed alpha and beta activities. There is a well defined posterior dominant rhythm of 9 hz that attenuates with eye opening.  There is focal left frontotemporal slowing.  He had three seizures lasting 2:07, 2:42 and 1:27 with poorly localized left hemispheric onset. No definite clinical correlate was seen, though with the first there is possibly some liip smacking, but not clear on video due to darkening for photic stimulation. Photic stimulation and HV were  performed. EEG Abnormalities: 1) Three focal left hemispheric seizures. As above. 2) Left frontotemporal seizures Clinical Interpretation: This EEG recorded three discrete focal seizures arising from the left hemisphere with no definite clinical correlate. Roland Rack, MD [facility] [phone] If 7pm- 7am, please page neurology on call as listed in [place].   DG Chest 1 View  Result Date: 06/10/2020 CLINICAL DATA:  Altered mental status EXAM: CHEST  1 VIEW COMPARISON:  None. FINDINGS: Benign calcified granuloma noted at the left lung base. Lungs are otherwise clear. No pneumothorax or pleural effusion. Cardiac size within normal limits. Pulmonary vascularity is normal. No acute bone abnormality. IMPRESSION: No active disease. Electronically Signed   By: Fidela Salisbury MD   On: 06/10/2020 01:06  DG Abdomen 1 View  Result Date: 06/10/2020 CLINICAL DATA:  MRI clearance EXAM: ABDOMEN - 1 VIEW COMPARISON:  None. FINDINGS: The bowel gas pattern is normal. No radio-opaque calculi or other significant radiographic abnormality are seen. No metallic foreign body. IMPRESSION: No metallic foreign body. Nonobstructive bowel gas pattern. Electronically Signed   By: Ulyses Jarred M.D.   On: 06/10/2020 01:07   CT HEAD WO CONTRAST  Addendum Date: 06/09/2020   ADDENDUM REPORT: 06/09/2020 22:24 ADDENDUM: These results were called by telephone at the time of interpretation on 06/09/2020 at 10:13 pm to provider Dr. Alvino Chapel, who verbally acknowledged these results. Electronically Signed   By: Primitivo Gauze M.D.   On: 06/09/2020 22:24   Result Date: 06/09/2020 CLINICAL DATA:  Mental status change, unknown cause. EXAM: CT HEAD WITHOUT CONTRAST TECHNIQUE: Contiguous axial images were obtained from the base of the skull through the vertex without intravenous contrast. COMPARISON:  12/18/2019 MRI/MRA head.  12/12/2019 head CT. FINDINGS: Brain: Redemonstration of infiltrative hypodensity  involving the left temporal lobe. Partial effacement of the left lateral ventricle, increased when compared to prior MRI. Abutment of the left midbrain by the mesial left temporal lobe appears more conspicuous. New rightward midline shift of 4 mm. No ventriculomegaly or extra-axial fluid collection. No intracranial hemorrhage. Vascular: No hyperdense vessel or unexpected calcification. Skull: No acute finding. Sinuses/Orbits: Normal orbits. Clear paranasal sinuses. No mastoid effusion. Other: None. IMPRESSION: Redemonstration of infiltrative left temporal lesion. Increased partial effacement of the left lateral ventricle and abutment of the left midbrain. New rightward midline shift of 4 mm. MRI head with and without contrast is recommended for better evaluation. Electronically Signed: By: Primitivo Gauze M.D. On: 06/09/2020 22:06   CT CHEST W CONTRAST  Result Date: 06/10/2020 CLINICAL DATA:  Brain/CNS neoplasm. Evaluate for metastatic disease/primary neoplasm. EXAM: CT CHEST, ABDOMEN, AND PELVIS WITH CONTRAST TECHNIQUE: Multidetector CT imaging of the chest, abdomen and pelvis was performed following the standard protocol during bolus administration of intravenous contrast. CONTRAST:  175mL OMNIPAQUE IOHEXOL 300 MG/ML  SOLN COMPARISON:  Brain MRI, 06/10/2020. Abdomen radiographs, 06/10/2020. FINDINGS: CT CHEST FINDINGS Cardiovascular: Heart normal in size and configuration. No pericardial effusion. No coronary artery calcifications. Normal great vessels and widely patent aortic arch branch vessels. Mediastinum/Nodes: Normal thyroid. No neck base, axillary, mediastinal or hilar masses or enlarged lymph nodes. Normal trachea and esophagus. Lungs/Pleura: Minor dependent atelectasis. Calcified granuloma in the left upper lobe lingula. No evidence of pneumonia or pulmonary edema. No lung mass or suspicious nodule. No pleural effusion or pneumothorax. Musculoskeletal: No fracture or bone lesion. No significant  skeletal abnormality. No chest wall mass. CT ABDOMEN PELVIS FINDINGS Hepatobiliary: No focal liver abnormality is seen. No gallstones, gallbladder wall thickening, or biliary dilatation. Pancreas: Unremarkable. No pancreatic ductal dilatation or surrounding inflammatory changes. Spleen: Normal in size without focal abnormality. Adrenals/Urinary Tract: Normal adrenal glands. Kidneys normal in size, orientation and position with symmetric enhancement and excretion. No renal masses. No hydronephrosis. Normal ureters. Bladder unremarkable. Stomach/Bowel: Stomach is within normal limits. Appendix appears normal. No evidence of bowel wall thickening, distention, or inflammatory changes. Vascular/Lymphatic: No significant vascular findings are present. No enlarged abdominal or pelvic lymph nodes. Reproductive: Prostate mildly enlarged, 4.8 x 3.5 cm transversely. Other: No abdominal wall hernia or abnormality. No abdominopelvic ascites. Musculoskeletal: Chronic bilateral pars defects at L5-S1. Minimal anterolisthesis. Skeletal structures otherwise unremarkable. IMPRESSION: 1. No evidence of a primary malignancy or metastatic disease within the chest, abdomen or pelvis. 2. No acute findings within  the chest, abdomen or pelvis. Electronically Signed   By: Lajean Manes M.D.   On: 06/10/2020 09:52   MR Brain W and Wo Contrast  Result Date: 06/10/2020 CLINICAL DATA:  Brain mass EXAM: MRI HEAD WITHOUT AND WITH CONTRAST TECHNIQUE: Multiplanar, multiecho pulse sequences of the brain and surrounding structures were obtained without and with intravenous contrast. CONTRAST:  7.91mL GADAVIST GADOBUTROL 1 MMOL/ML IV SOLN COMPARISON:  Brain MRI 12/18/2019 FINDINGS: Brain: No acute infarct, acute hemorrhage or extra-axial collection. Slight worsening of abnormal hyperintense T2-weighted signal within the left temporal lobe. Mass effect on the left lateral ventricle and brainstem is slightly increased. There is no contrast enhancement  within the lesion. No chronic microhemorrhage. Normal midline structures. Vascular: Normal flow voids. Skull and upper cervical spine: Normal marrow signal. Sinuses/Orbits: Negative. Other: None. 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 Electronically Signed   By: Ulyses Jarred M.D.   On: 06/10/2020 03:12   CT ABDOMEN PELVIS W CONTRAST  Result Date: 06/10/2020 CLINICAL DATA:  Brain/CNS neoplasm. Evaluate for metastatic disease/primary neoplasm. EXAM: CT CHEST, ABDOMEN, AND PELVIS WITH CONTRAST TECHNIQUE: Multidetector CT imaging of the chest, abdomen and pelvis was performed following the standard protocol during bolus administration of intravenous contrast. CONTRAST:  111mL OMNIPAQUE IOHEXOL 300 MG/ML  SOLN COMPARISON:  Brain MRI, 06/10/2020. Abdomen radiographs, 06/10/2020. FINDINGS: CT CHEST FINDINGS Cardiovascular: Heart normal in size and configuration. No pericardial effusion. No coronary artery calcifications. Normal great vessels and widely patent aortic arch branch vessels. Mediastinum/Nodes: Normal thyroid. No neck base, axillary, mediastinal or hilar masses or enlarged lymph nodes. Normal trachea and esophagus. Lungs/Pleura: Minor dependent atelectasis. Calcified granuloma in the left upper lobe lingula. No evidence of pneumonia or pulmonary edema. No lung mass or suspicious nodule. No pleural effusion or pneumothorax. Musculoskeletal: No fracture or bone lesion. No significant skeletal abnormality. No chest wall mass. CT ABDOMEN PELVIS FINDINGS Hepatobiliary: No focal liver abnormality is seen. No gallstones, gallbladder wall thickening, or biliary dilatation. Pancreas: Unremarkable. No pancreatic ductal dilatation or surrounding inflammatory changes. Spleen: Normal in size without focal abnormality. Adrenals/Urinary Tract: Normal adrenal glands. Kidneys normal in size, orientation  and position with symmetric enhancement and excretion. No renal masses. No hydronephrosis. Normal ureters. Bladder unremarkable. Stomach/Bowel: Stomach is within normal limits. Appendix appears normal. No evidence of bowel wall thickening, distention, or inflammatory changes. Vascular/Lymphatic: No significant vascular findings are present. No enlarged abdominal or pelvic lymph nodes. Reproductive: Prostate mildly enlarged, 4.8 x 3.5 cm transversely. Other: No abdominal wall hernia or abnormality. No abdominopelvic ascites. Musculoskeletal: Chronic bilateral pars defects at L5-S1. Minimal anterolisthesis. Skeletal structures otherwise unremarkable. IMPRESSION: 1. No evidence of a primary malignancy or metastatic disease within the chest, abdomen or pelvis. 2. No acute findings within the chest, abdomen or pelvis. Electronically Signed   By: Lajean Manes M.D.   On: 06/10/2020 09:52      Scheduled Meds: . clonazePAM  2 mg Oral TID  . dexamethasone  4 mg Oral Q6H   Followed by  . [START ON 06/14/2020] dexamethasone  4 mg Oral Q8H   Followed by  . [START ON 06/16/2020] dexamethasone  4 mg Oral Q12H   Followed by  . [START ON 06/18/2020] dexamethasone  4 mg Oral Daily  . furosemide  20 mg Oral BID  . phenytoin (DILANTIN) IV  100 mg Intravenous Q8H   Continuous Infusions: . lacosamide (VIMPAT) IV 200  mg (06/12/20 0626)  . levETIRAcetam 1,500 mg (06/12/20 0326)     LOS: 2 days      Debbe Odea, MD [facility] Pager: [email or web address] 06/12/2020, 3:55 PM

## 2020-06-12 NOTE — Progress Notes (Signed)
Subjective: Patient reports no headaches, vision changes.  Objective: Vital signs in last 24 hours: Temp:  [97.7 F (36.5 C)-99.3 F (37.4 C)] 97.9 F (36.6 C) (11/01 0327) Pulse Rate:  [76-89] 78 (11/01 0327) Resp:  [16-18] 18 (10/31 2325) BP: (107-117)/(70-79) 113/76 (11/01 0327) SpO2:  [96 %-100 %] 100 % (11/01 0327)  Intake/Output from previous day: 10/31 0701 - 11/01 0700 In: 765 [P.O.:420; IV Piggyback:345] Out: 2850 [Urine:2850] Intake/Output this shift: No intake/output data recorded.  Mild drowsiness.  Oriented to person only.  Short term memory difficulty, mild anterograde amnesia. VFC grossly.  No pronator drift.  EEG on.  Lab Results: Recent Labs    06/09/20 2055 06/09/20 2055 06/09/20 2111 06/10/20 0309  WBC 9.1  --   --  9.6  HGB 14.9   < > 14.6 13.1  HCT 43.6   < > 43.0 38.2*  PLT 307  --   --  283   < > = values in this interval not displayed.   BMET Recent Labs    06/10/20 0727 06/10/20 0727 06/10/20 1337 06/10/20 2317 06/11/20 0842 06/12/20 0144  NA 122*   < > 125*   < > 129* 134*  K 3.5  --  4.0  --   --   --   CL 88*  --  90*  --   --   --   CO2 25  --  27  --   --   --   GLUCOSE 122*  --  118*  --   --   --   BUN 8  --  8  --   --   --   CREATININE 0.70  --  0.71  --   --   --   CALCIUM 8.4*  --  8.6*  --   --   --    < > = values in this interval not displayed.    Studies/Results: EEG  Result Date: 06/10/2020 Greta Doom, MD     06/10/2020 10:25 AM History: 59 year old male with no mass presented with altered mental status Sedation: None Technique: This is a 21 channel routine scalp EEG performed at the bedside with bipolar and monopolar montages arranged in accordance to the international 10/20 system of electrode placement. One channel was dedicated to EKG recording. Background: The background consists of intermixed alpha and beta activities. There is a well defined posterior dominant rhythm of 9 hz that attenuates with  eye opening.  There is focal left frontotemporal slowing.  He had three seizures lasting 2:07, 2:42 and 1:27 with poorly localized left hemispheric onset. No definite clinical correlate was seen, though with the first there is possibly some liip smacking, but not clear on video due to darkening for photic stimulation. Photic stimulation and HV were performed. EEG Abnormalities: 1) Three focal left hemispheric seizures. As above. 2) Left frontotemporal seizures Clinical Interpretation: This EEG recorded three discrete focal seizures arising from the left hemisphere with no definite clinical correlate. Roland Rack, MD [facility] [phone] If 7pm- 7am, please page neurology on call as listed in [place].   CT CHEST W CONTRAST  Result Date: 06/10/2020 CLINICAL DATA:  Brain/CNS neoplasm. Evaluate for metastatic disease/primary neoplasm. EXAM: CT CHEST, ABDOMEN, AND PELVIS WITH CONTRAST TECHNIQUE: Multidetector CT imaging of the chest, abdomen and pelvis was performed following the standard protocol during bolus administration of intravenous contrast. CONTRAST:  123mL OMNIPAQUE IOHEXOL 300 MG/ML  SOLN COMPARISON:  Brain MRI, 06/10/2020. Abdomen radiographs, 06/10/2020. FINDINGS:  CT CHEST FINDINGS Cardiovascular: Heart normal in size and configuration. No pericardial effusion. No coronary artery calcifications. Normal great vessels and widely patent aortic arch branch vessels. Mediastinum/Nodes: Normal thyroid. No neck base, axillary, mediastinal or hilar masses or enlarged lymph nodes. Normal trachea and esophagus. Lungs/Pleura: Minor dependent atelectasis. Calcified granuloma in the left upper lobe lingula. No evidence of pneumonia or pulmonary edema. No lung mass or suspicious nodule. No pleural effusion or pneumothorax. Musculoskeletal: No fracture or bone lesion. No significant skeletal abnormality. No chest wall mass. CT ABDOMEN PELVIS FINDINGS Hepatobiliary: No focal liver abnormality is  seen. No gallstones, gallbladder wall thickening, or biliary dilatation. Pancreas: Unremarkable. No pancreatic ductal dilatation or surrounding inflammatory changes. Spleen: Normal in size without focal abnormality. Adrenals/Urinary Tract: Normal adrenal glands. Kidneys normal in size, orientation and position with symmetric enhancement and excretion. No renal masses. No hydronephrosis. Normal ureters. Bladder unremarkable. Stomach/Bowel: Stomach is within normal limits. Appendix appears normal. No evidence of bowel wall thickening, distention, or inflammatory changes. Vascular/Lymphatic: No significant vascular findings are present. No enlarged abdominal or pelvic lymph nodes. Reproductive: Prostate mildly enlarged, 4.8 x 3.5 cm transversely. Other: No abdominal wall hernia or abnormality. No abdominopelvic ascites. Musculoskeletal: Chronic bilateral pars defects at L5-S1. Minimal anterolisthesis. Skeletal structures otherwise unremarkable. IMPRESSION: 1. No evidence of a primary malignancy or metastatic disease within the chest, abdomen or pelvis. 2. No acute findings within the chest, abdomen or pelvis. Electronically Signed   By: Lajean Manes M.D.   On: 06/10/2020 09:52   CT ABDOMEN PELVIS W CONTRAST  Result Date: 06/10/2020 CLINICAL DATA:  Brain/CNS neoplasm. Evaluate for metastatic disease/primary neoplasm. EXAM: CT CHEST, ABDOMEN, AND PELVIS WITH CONTRAST TECHNIQUE: Multidetector CT imaging of the chest, abdomen and pelvis was performed following the standard protocol during bolus administration of intravenous contrast. CONTRAST:  164mL OMNIPAQUE IOHEXOL 300 MG/ML  SOLN COMPARISON:  Brain MRI, 06/10/2020. Abdomen radiographs, 06/10/2020. FINDINGS: CT CHEST FINDINGS Cardiovascular: Heart normal in size and configuration. No pericardial effusion. No coronary artery calcifications. Normal great vessels and widely patent aortic arch branch vessels. Mediastinum/Nodes: Normal thyroid. No neck base, axillary,  mediastinal or hilar masses or enlarged lymph nodes. Normal trachea and esophagus. Lungs/Pleura: Minor dependent atelectasis. Calcified granuloma in the left upper lobe lingula. No evidence of pneumonia or pulmonary edema. No lung mass or suspicious nodule. No pleural effusion or pneumothorax. Musculoskeletal: No fracture or bone lesion. No significant skeletal abnormality. No chest wall mass. CT ABDOMEN PELVIS FINDINGS Hepatobiliary: No focal liver abnormality is seen. No gallstones, gallbladder wall thickening, or biliary dilatation. Pancreas: Unremarkable. No pancreatic ductal dilatation or surrounding inflammatory changes. Spleen: Normal in size without focal abnormality. Adrenals/Urinary Tract: Normal adrenal glands. Kidneys normal in size, orientation and position with symmetric enhancement and excretion. No renal masses. No hydronephrosis. Normal ureters. Bladder unremarkable. Stomach/Bowel: Stomach is within normal limits. Appendix appears normal. No evidence of bowel wall thickening, distention, or inflammatory changes. Vascular/Lymphatic: No significant vascular findings are present. No enlarged abdominal or pelvic lymph nodes. Reproductive: Prostate mildly enlarged, 4.8 x 3.5 cm transversely. Other: No abdominal wall hernia or abnormality. No abdominopelvic ascites. Musculoskeletal: Chronic bilateral pars defects at L5-S1. Minimal anterolisthesis. Skeletal structures otherwise unremarkable. IMPRESSION: 1. No evidence of a primary malignancy or metastatic disease within the chest, abdomen or pelvis. 2. No acute findings within the chest, abdomen or pelvis. Electronically Signed   By: Lajean Manes M.D.   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

## 2020-06-12 NOTE — Procedures (Addendum)
Patient Name: Tyson Parkison  MRN: [id number]  Epilepsy Attending: Lora Havens  Referring Physician/Provider: Solon Augusta, PA Duration: 06/11/2020 1205 to 06/12/2020 1205  Patient history: 59yo M with ams. EEG to evaluate for seizure.  Level of alertness: Awake, asleep  AEDs during EEG study: LEV PHT, Vimpat, Clonopin  Technical aspects: This EEG study was done with scalp electrodes positioned according to the 10-20 International system of electrode placement. Electrical activity was acquired at a sampling rate of 500Hz  and reviewed with a high frequency filter of 70Hz  and a low frequency filter of 1Hz . EEG data were recorded continuously and digitally stored.   Description: The posterior dominant rhythm consists of 9 Hz activity of moderate voltage (25-35 uV) seen predominantly in posterior head regions, symmetric and reactive to eye opening and eye closing.  Sleep was characterized by vertex waves, sleep spindles (12 to 14 Hz), maximal frontocentral region.  EEG showed continuous 3 to 6 Hz theta-delta slowing in left frontotemporal region as well as 15 to 18 Hz generalized beta activity was also noted.  Seizures without clinical signs arising from left frontotemporal region were seen however the frequency gradually improved as well as the duration shortened to about 15 to 20 seconds as AEDs were added. Last seizure was on 06/12/2020 at  0806.   ABNORMALITY -Seizures without clinical signs, left frontemporal region -Continuous slow, left frontotemporal region  IMPRESSION: This study showed multiple seizures without clear clinical signs arising from left frontotemporal region. However, after addition of AEDs, the frequency and duration of seizures has significantly improved. Last seizure at 0806 on 06/12/2020.There is also cortical dysfunction in left frontotemporal region consistent with underlying mass.    Porschia Willbanks Barbra Sarks

## 2020-06-12 NOTE — Progress Notes (Signed)
Subjective: Patient lying flat in bed with no complaints today.  Sodium greatly improved to 134.  Patient trying to get up out of bed but also very pleasant.  Objective Vital signs in last 24 hours: Vitals:   06/11/20 2028 06/11/20 2325 06/12/20 0327 06/12/20 0803  BP: 112/76 107/70 113/76 118/75  Pulse: 81 78 78 78  Resp: 18 18  16   Temp: 97.7 F (36.5 C) 98 F (36.7 C) 97.9 F (36.6 C) 97.9 F (36.6 C)  TempSrc: Oral Oral Oral Oral  SpO2: 100% 96% 100% 99%  Weight:      Height:       Weight change:   Intake/Output Summary (Last 24 hours) at 06/12/2020 1006 Last data filed at 06/12/2020 4854 Gross per 24 hour  Intake 765 ml  Output 2850 ml  Net -2085 ml    Assessment/Plan: 59 year old Hispanic male with brain mass and hyponatremia  1.  Brain mass - full body imaging does not show any primary focus of malignancy.  Neurosurgery is concerned this could represent a glioma.  Continued work-up via appropriate services 2.  Seizures-I feel that the brain mass as opposed to the hyponatremia is probably responsible for the seizure issue.  Neurology on board, started on [place] 3.  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  Significantly improved with a sodium of 134 today.  We will stop the demeclocycline but continue Lasix for now.    Reesa Chew    Labs: Basic Metabolic Panel: Recent Labs  Lab 06/10/20 0309 06/10/20 0309 06/10/20 6270 06/10/20 0727 06/10/20 1337 06/10/20 2317 06/11/20 0433 06/11/20 0842 06/12/20 0144  NA 125*   < > 122*   < > 125*   < > 129* 129* 134*  K 3.6  --  3.5  --  4.0  --   --   --   --   CL 92*  --  88*  --  90*  --   --   --   --   CO2 24  --  25  --  27  --   --   --   --   GLUCOSE 121*  --  122*  --  118*  --   --   --   --   BUN 9  --  8  --  8  --   --   --   --   CREATININE 0.70  --  0.70  --  0.71  --   --   --   --   CALCIUM 8.3*  --  8.4*  --  8.6*  --   --   --   --    < > = values in this interval not displayed.   Liver  Function Tests: Recent Labs  Lab 06/09/20 2055  AST 30  ALT 23  ALKPHOS 55  BILITOT 1.0  PROT 7.7  ALBUMIN 4.5   No results for input(s): LIPASE, AMYLASE in the last 168 hours. No results for input(s): AMMONIA in the last 168 hours. CBC: Recent Labs  Lab 06/09/20 2055 06/09/20 2111 06/10/20 0309  WBC 9.1  --  9.6  NEUTROABS 7.7  --  7.5  HGB 14.9 14.6 13.1  HCT 43.6 43.0 38.2*  MCV 82.1  --  83.4  PLT 307  --  283   Cardiac Enzymes: No results for input(s): CKTOTAL, CKMB, CKMBINDEX, TROPONINI in the last 168 hours. CBG: Recent Labs  Lab 06/09/20 2019  GLUCAP 129*  Iron Studies: No results for input(s): IRON, TIBC, TRANSFERRIN, FERRITIN in the last 72 hours. Studies/Results: EEG  Result Date: 06/10/2020 Greta Doom, MD     06/10/2020 10:25 AM History: 59 year old male with no mass presented with altered mental status Sedation: None Technique: This is a 21 channel routine scalp EEG performed at the bedside with bipolar and monopolar montages arranged in accordance to the international 10/20 system of electrode placement. One channel was dedicated to EKG recording. Background: The background consists of intermixed alpha and beta activities. There is a well defined posterior dominant rhythm of 9 hz that attenuates with eye opening.  There is focal left frontotemporal slowing.  He had three seizures lasting 2:07, 2:42 and 1:27 with poorly localized left hemispheric onset. No definite clinical correlate was seen, though with the first there is possibly some liip smacking, but not clear on video due to darkening for photic stimulation. Photic stimulation and HV were performed. EEG Abnormalities: 1) Three focal left hemispheric seizures. As above. 2) Left frontotemporal seizures Clinical Interpretation: This EEG recorded three discrete focal seizures arising from the left hemisphere with no definite clinical correlate. Roland Rack, MD [facility]  [phone] If 7pm- 7am, please page neurology on call as listed in [place].   Medications: Infusions: . lacosamide (VIMPAT) IV 200 mg (06/12/20 0626)  . levETIRAcetam 1,500 mg (06/12/20 0326)    Scheduled Medications: . clonazePAM  2 mg Oral TID  . furosemide  20 mg Oral BID  . phenytoin (DILANTIN) IV  100 mg Intravenous Q8H    have reviewed scheduled and prn medications.  Physical Exam: General: speech more fluent, english is better Heart: RRR Lungs: mostly clear Abdomen: soft, non tender Extremities: no edema    06/12/2020,10:06 AM  LOS: 2 days

## 2020-06-12 NOTE — Plan of Care (Signed)

## 2020-06-12 NOTE — Progress Notes (Addendum)
Subjective: NAEO. Wife at bedside.   ROS: negative except above  Examination  Vital signs in last 24 hours: Temp:  [97.4 F (36.3 C)-99.2 F (37.3 C)] 97.4 F (36.3 C) (11/01 1104) Pulse Rate:  [78-81] 79 (11/01 1104) Resp:  [16-18] 18 (11/01 1104) BP: (107-118)/(70-76) 115/73 (11/01 1104) SpO2:  [96 %-100 %] 97 % (11/01 1104)  General: lying in bed, NAD CVS: pulse-normal rate and rhythm RS: breathing comfortably, CTAB Extremities: normal, warm  Neuro: MS: Alert, oriented to place and person, time 1981, follows commands, able to do basic math (2+3 = 5) poor attention span, unable to tell me days of the week CN: pupils equal and reactive,  EOMI, face symmetric, tongue midline, normal sensation over face Motor: 4/5 in RUE/RLE, 5/5 in LUE/LLE Sensory: intact to light touch BL  Basic Metabolic Panel: Recent Labs  Lab 06/09/20 2055 06/09/20 2055 06/09/20 2111 06/10/20 0309 06/10/20 0309 06/10/20 0727 06/10/20 0727 06/10/20 1337 06/10/20 1337 06/10/20 2317 06/11/20 0433 06/11/20 0842 06/11/20 1144 06/12/20 0144 06/12/20 1349  NA 126*   < > 126* 125*   < > 122*   < > 125*   < > 126* 129* 129*  --  134* 136  K 3.8  --  3.9 3.6  --  3.5  --  4.0  --   --   --   --   --   --   --   CL 90*  --  92* 92*  --  88*  --  90*  --   --   --   --   --   --   --   CO2 25  --   --  24  --  25  --  27  --   --   --   --   --   --   --   GLUCOSE 142*  --  138* 121*  --  122*  --  118*  --   --   --   --   --   --   --   BUN 12  --  13 9  --  8  --  8  --   --   --   --   --   --   --   CREATININE 0.78  --  0.60* 0.70  --  0.70  --  0.71  --   --   --   --   --   --   --   CALCIUM 8.9   < >  --  8.3*  --  8.4*  --  8.6*  --   --   --   --   --   --   --   MG  --   --   --   --   --   --   --   --   --   --   --   --  2.0  --   --    < > = values in this interval not displayed.    CBC: Recent Labs  Lab 06/09/20 2055 06/09/20 2111 06/10/20 0309  WBC 9.1  --  9.6  NEUTROABS 7.7   --  7.5  HGB 14.9 14.6 13.1  HCT 43.6 43.0 38.2*  MCV 82.1  --  83.4  PLT 307  --  283     Coagulation Studies: Recent Labs    06/09/20 2055  LABPROT 12.6  INR  1.0    Imaging  MRI brain with and without contrast 06/10/2020: Slight worsening of infiltrative tumor of the left temporal lobe, with slight worsening of mass effect on the left lateral ventricle and brainstem.  CT chest abdomen pelvis with contrast: No acute findings, no evidence of primary malignancy or metastatic disease.   ASSESSMENT AND PLAN: 59 year old male who presented with seizures.  MRI brain showed left temporal brain mass with mass-effect.  EEG showed frequent seizures, improved after being on 4 AEDs.  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  Clinically, patient also improving by wife but not back to baseline.  Recommendations -We will add dexamethasone 4 mg every 6 hours with plan to slowly wean every 2 days and eventually stop -Continue Keppra 1500 mg twice daily, lacosamide 200 mg twice daily, Dilantin 100 mg every 8 hours and Klonopin 2 mg 3 times daily -Continue LTM EEG, will likely stop if seizures improve tomorrow -Continue seizure precautions -As needed IV Ativan 2 mg for clinical seizure-like activity -Discussed current EEG findings, side effects of AEDs, management plan with wife at bedside for the help of Spanish interpreter -Will likely need follow-up with neuro-oncology Dr. Mickeal Skinner -Management of rest of comorbidities per primary team  I have spent a total of  35  minutes with the patient reviewing hospital notes,  test results, labs and examining the patient as well as establishing an assessment and plan that was discussed personally with the patient.  > 50% of time was spent in direct patient care.   [name] Epilepsy [facility] For questions after 5pm  please refer to AMION to reach the Neurologist on call

## 2020-06-13 LAB — RENAL FUNCTION PANEL
Albumin: 3.6 g/dL (ref 3.5–5.0)
Anion gap: 9 (ref 5–15)
BUN: 14 mg/dL (ref 6–20)
CO2: 28 mmol/L (ref 22–32)
Calcium: 9.1 mg/dL (ref 8.9–10.3)
Chloride: 98 mmol/L (ref 98–111)
Creatinine, Ser: 1.01 mg/dL (ref 0.61–1.24)
GFR, Estimated: >60 mL/min (ref >60)
Glucose, Bld: 116 mg/dL — ABNORMAL HIGH (ref 70–99)
Phosphorus: 4.6 mg/dL (ref 2.5–4.6)
Potassium: 4.4 mmol/L (ref 3.5–5.1)
Sodium: 135 mmol/L (ref 135–145)

## 2020-06-13 MED ORDER — PHENYTOIN 50 MG PO CHEW
100.0000 mg | CHEWABLE_TABLET | Freq: Three times a day (TID) | ORAL | Status: DC
  Administered 2020-06-13 – 2020-06-16 (×9): 100 mg via ORAL
  Filled 2020-06-13 (×10): qty 2

## 2020-06-13 MED ORDER — LACOSAMIDE 50 MG PO TABS
200.0000 mg | ORAL_TABLET | Freq: Two times a day (BID) | ORAL | Status: DC
  Administered 2020-06-13 – 2020-06-16 (×6): 200 mg via ORAL
  Filled 2020-06-13 (×2): qty 1
  Filled 2020-06-13: qty 4
  Filled 2020-06-13: qty 1
  Filled 2020-06-13 (×2): qty 4

## 2020-06-13 MED ORDER — LORAZEPAM 2 MG/ML IJ SOLN: 2.0000 mg | INTRAMUSCULAR | Status: DC | PRN

## 2020-06-13 MED ORDER — LEVETIRACETAM 750 MG PO TABS
1500.0000 mg | ORAL_TABLET | Freq: Two times a day (BID) | ORAL | Status: DC
  Administered 2020-06-13 – 2020-06-16 (×6): 1500 mg via ORAL
  Filled 2020-06-13 (×6): qty 2

## 2020-06-13 NOTE — Progress Notes (Addendum)
PROGRESS NOTE    Hunter Terry   DSK:[id number]  DOB: 27-Sep-1960  DOA: 06/09/2020 PCP: Patient, No Pcp Per   Brief Narrative:  Hunter Terry  is a 59 y.o. male with with history of having been diagnosed with possible seizures in May 2021 at that time MRI brain done which showed possible left temporal infiltrative process for which patient did not seek any further medical advice is a truck driver for [employer] and was driving back from [place] to [place] when patient was found to be increasingly confused by patient's colleagues at workplace and since truck has a camera on the [employer] nurse was trying to communicate with the patient was found to be not following the commands and remotely the truck was stopped as per the report.  Patient was brought to the ER.  Per patient's wife patient waited talking to her last night had about half hour appeared confused and talking nonsensical things before becoming normal.  6 months ago patient had waking up from sleep with incontinence of urine and tongue bite at that time there was some concern for seizure since patient's grandfather also had similar symptoms with seizures.  That prompted patient to be taken to the ER had an MRI done which showed left temporal lesion for which further medical advice was not sought.  In ED > CT of the head shows redemonstration of the infiltrative left temporal lesion increased partial effacement of the lateral ventricle and abutment of the left midbrain with midline shift of 4 mm.  Subjective: No complaints.    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 - CT chest/abd pelvis reveals no other concerning masses - I have had a conversation with his wife on 11/2 via interpretor and explained findings and plan. - Dr Domingo Cocking is aware of this patient - I have  spoken with him today (not consulted him yet) - waiting on NS to decide on biopsy/surgery - Addendum: NS plans on biopsy on Thursday followed by possibly 1 night in ICU and then d/c home if stable.  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 If due to mass vs due to postictal state from seizures in addition to hyponatremia -when having seizures, he was intermittently requiring restraints because he tries to get out of bed- no sitter available -  his wife states he is still not at his baseline- he is forgetful and repeating things - on my exam he  is still confused to time and situation - knows he is in the hospital  Time spent in minutes: 35 DVT prophylaxis: SCDs Start: 06/10/20 0309  Code Status: Full code Family Communication: wife at bedside via translator Disposition Plan:  Status is: Inpatient  Remains inpatient appropriate because:brain mass with confusion, seizures     Dispo: The patient is from: Home              Anticipated d/c is to: TBD              Anticipated d/c date is: > 3  days              Patient currently is not medically stable to d/c.  Consultants:   NS  Neurology  Renal Procedures:   EEG Antimicrobials:  Anti-infectives (From admission, onward)   Start     Dose/Rate Route Frequency Ordered Stop   06/11/20 0000  demeclocycline (DECLOMYCIN) tablet 300 mg  Status:  Discontinued        300 mg Per NG tube Every 6 hours 06/10/20 1952 06/10/20 2004   06/11/20 0000  demeclocycline (DECLOMYCIN) tablet 300 mg  Status:  Discontinued        300 mg Oral Every 6 hours 06/10/20 2004 06/12/20 1003   06/10/20 1200  demeclocycline (DECLOMYCIN) tablet 300 mg  Status:  Discontinued        300 mg Oral Every 6 hours 06/10/20 1018 06/10/20 1952       Objective: Vitals:   06/12/20 2104 06/13/20 0018 06/13/20 0344 06/13/20 0910  BP: 107/71 119/84 116/80 124/82  Pulse: 81 79 80 83  Resp: 16 18 18 20   Temp: 98 F (36.7 C) 98 F (36.7 C) 98 F (36.7 C) (!) 97.5 F (36.4 C)  TempSrc: Oral Oral Oral Oral  SpO2: 99% 100% 100% 99%  Weight:      Height:        Intake/Output Summary (Last 24 hours) at 06/13/2020 1109 Last data filed at 06/13/2020 0918 Gross per 24 hour  Intake 240 ml  Output 325 ml  Net -85 ml   Filed Weights   06/09/20 2013 06/10/20 1300  Weight: 72.6 kg 72.5 kg    Examination: General exam: Appears comfortable  HEENT: PERRLA, oral mucosa moist, no sclera icterus or thrush Respiratory system: Clear to auscultation. Respiratory effort normal. Cardiovascular system: S1 & S2 heard,  No murmurs  Gastrointestinal system: Abdomen soft, non-tender, nondistended. Normal bowel sounds   Central nervous system: Alert and oriented x 2 . No focal neurological deficits. Extremities: No cyanosis, clubbing or edema Skin: No rashes or ulcers Psychiatry:  Mood & affect appropriate.    Data Reviewed: I have personally reviewed following labs and imaging studies  CBC: Recent Labs  Lab 06/09/20 2055 06/09/20 2111 06/10/20 0309  WBC 9.1  --   9.6  NEUTROABS 7.7  --  7.5  HGB 14.9 14.6 13.1  HCT 43.6 43.0 38.2*  MCV 82.1  --  83.4  PLT 307  --  916   Basic Metabolic Panel: Recent Labs  Lab 06/09/20 2055 06/09/20 2055 06/09/20 2111 06/09/20 2111 06/10/20 0309 06/10/20 0309 06/10/20 0727 06/10/20 0727 06/10/20 1337 06/10/20 2317 06/11/20 0433 06/11/20 0842 06/11/20 1144 06/12/20 0144 06/12/20 1349 06/13/20 0430  NA 126*   < > 126*   < > 125*   < > 122*   < > 125*   < > 129* 129*  --  134* 136 135  K 3.8   < >  3.9  --  3.6  --  3.5  --  4.0  --   --   --   --   --   --  4.4  CL 90*   < > 92*  --  92*  --  88*  --  90*  --   --   --   --   --   --  98  CO2 25  --   --   --  24  --  25  --  27  --   --   --   --   --   --  28  GLUCOSE 142*   < > 138*  --  121*  --  122*  --  118*  --   --   --   --   --   --  116*  BUN 12   < > 13  --  9  --  8  --  8  --   --   --   --   --   --  14  CREATININE 0.78   < > 0.60*  --  0.70  --  0.70  --  0.71  --   --   --   --   --   --  1.01  CALCIUM 8.9  --   --   --  8.3*  --  8.4*  --  8.6*  --   --   --   --   --   --  9.1  MG  --   --   --   --   --   --   --   --   --   --   --   --  2.0  --   --   --   PHOS  --   --   --   --   --   --   --   --   --   --   --   --   --   --   --  4.6   < > = values in this interval not displayed.   GFR: Estimated Creatinine Clearance: 68.5 mL/min (by C-G formula based on SCr of 1.01 mg/dL). Liver Function Tests: Recent Labs  Lab 06/09/20 2055 06/13/20 0430  AST 30  --   ALT 23  --   ALKPHOS 55  --   BILITOT 1.0  --   PROT 7.7  --   ALBUMIN 4.5 3.6   No results for input(s): LIPASE, AMYLASE in the last 168 hours. No results for input(s): AMMONIA in the last 168 hours. Coagulation Profile: Recent Labs  Lab 06/09/20 2055  INR 1.0   Cardiac Enzymes: No results for input(s): CKTOTAL, CKMB, CKMBINDEX, TROPONINI in the last 168 hours. BNP (last 3 results) No results for input(s): PROBNP in the last 8760 hours. HbA1C: No results  for input(s): HGBA1C in the last 72 hours. CBG: Recent Labs  Lab 06/09/20 2019  GLUCAP 129*   Lipid Profile: No results for input(s): CHOL, HDL, LDLCALC, TRIG, CHOLHDL, LDLDIRECT in the last 72 hours. Thyroid Function Tests: No results for input(s): TSH, T4TOTAL, FREET4, T3FREE, THYROIDAB in the last 72 hours. Anemia Panel: No results for input(s): VITAMINB12, FOLATE, FERRITIN, TIBC, IRON, RETICCTPCT in the last 72 hours. Urine analysis:    Component Value Date/Time   COLORURINE YELLOW 06/09/2020 2055   APPEARANCEUR HAZY (A) 06/09/2020 2055  LABSPEC 1.028 06/09/2020 2055   PHURINE 5.0 06/09/2020 2055   GLUCOSEU 150 (A) 06/09/2020 2055   HGBUR NEGATIVE 06/09/2020 2055   BILIRUBINUR NEGATIVE 06/09/2020 2055   KETONESUR 5 (A) 06/09/2020 2055   PROTEINUR 30 (A) 06/09/2020 2055   NITRITE NEGATIVE 06/09/2020 2055   LEUKOCYTESUR NEGATIVE 06/09/2020 2055   Sepsis Labs: @LABRCNTIP (procalcitonin:4,lacticidven:4) ) Recent Results (from the past 240 hour(s))  Respiratory Panel by RT PCR (Flu A&B, Covid) - Nasopharyngeal Swab     Status: None   Collection Time: 06/09/20  8:55 PM   Specimen: Nasopharyngeal Swab  Result Value Ref Range Status   SARS Coronavirus 2 by RT PCR NEGATIVE NEGATIVE Final    Comment: (NOTE) SARS-CoV-2 target nucleic acids are NOT DETECTED.  The SARS-CoV-2 RNA is generally detectable in upper respiratoy specimens during the acute phase of infection. The lowest concentration of SARS-CoV-2 viral copies this assay can detect is 131 copies/mL. A negative result does not preclude SARS-Cov-2 infection and should not be used as the sole basis for treatment or other patient management decisions. A negative result may occur with  improper specimen collection/handling, submission of specimen other than nasopharyngeal swab, presence of viral mutation(s) within the areas targeted by this assay, and inadequate number of viral copies (<131 copies/mL). A negative result  must be combined with clinical observations, patient history, and epidemiological information. The expected result is Negative.  Fact Sheet for Patients:  PinkCheek.be  Fact Sheet for Healthcare Providers:  GravelBags.it  This test is no t yet approved or cleared by the [place] FDA and  has been authorized for detection and/or diagnosis of SARS-CoV-2 by FDA under an Emergency Use Authorization (EUA). This EUA will remain  in effect (meaning this test can be used) for the duration of the COVID-19 declaration under Section 564(b)(1) of the Act, 21 U.S.C. section 360bbb-3(b)(1), unless the authorization is terminated or revoked sooner.     Influenza A by PCR NEGATIVE NEGATIVE Final   Influenza B by PCR NEGATIVE NEGATIVE Final    Comment: (NOTE) The Xpert Xpress SARS-CoV-2/FLU/RSV assay is intended as an aid in  the diagnosis of influenza from Nasopharyngeal swab specimens and  should not be used as a sole basis for treatment. Nasal washings and  aspirates are unacceptable for Xpert Xpress SARS-CoV-2/FLU/RSV  testing.  Fact Sheet for Patients: PinkCheek.be  Fact Sheet for Healthcare Providers: GravelBags.it  This test is not yet approved or cleared by the [place] FDA and  has been authorized for detection and/or diagnosis of SARS-CoV-2 by  FDA under an Emergency Use Authorization (EUA). This EUA will remain  in effect (meaning this test can be used) for the duration of the  Covid-19 declaration under Section 564(b)(1) of the Act, 21  U.S.C. section 360bbb-3(b)(1), unless the authorization is  terminated or revoked. Performed at [facility], [place] [address] [place], [place] [address]          Radiology Studies: EEG  Result Date: 06/10/2020 Greta Doom, MD     06/10/2020 10:25 AM History: 59 year old male with no mass presented  with altered mental status Sedation: None Technique: This is a 21 channel routine scalp EEG performed at the bedside with bipolar and monopolar montages arranged in accordance to the international 10/20 system of electrode placement. One channel was dedicated to EKG recording. Background: The background consists of intermixed alpha and beta activities. There is a well defined posterior dominant rhythm of 9 hz that attenuates with eye opening.  There is focal left frontotemporal slowing.  He had three seizures lasting 2:07, 2:42 and 1:27 with poorly localized left hemispheric onset. No definite clinical correlate was seen, though with the first there is possibly some liip smacking, but not clear on video due to darkening for photic stimulation. Photic stimulation and HV were performed. EEG Abnormalities: 1) Three focal left hemispheric seizures. As above. 2) Left frontotemporal seizures Clinical Interpretation: This EEG recorded three discrete focal seizures arising from the left hemisphere with no definite clinical correlate. Roland Rack, MD [facility] [phone] If 7pm- 7am, please page neurology on call as listed in [facility].   DG Chest 1 View  Result Date: 06/10/2020 CLINICAL DATA:  Altered mental status EXAM: CHEST  1 VIEW COMPARISON:  None. FINDINGS: Benign calcified granuloma noted at the left lung base. Lungs are otherwise clear. No pneumothorax or pleural effusion. Cardiac size within normal limits. Pulmonary vascularity is normal. No acute bone abnormality. IMPRESSION: No active disease. Electronically Signed   By: Fidela Salisbury MD   On: 06/10/2020 01:06   DG Abdomen 1 View  Result Date: 06/10/2020 CLINICAL DATA:  MRI clearance EXAM: ABDOMEN - 1 VIEW COMPARISON:  None. FINDINGS: The bowel gas pattern is normal. No radio-opaque calculi or other significant radiographic abnormality are seen. No metallic foreign body. IMPRESSION: No metallic foreign body. Nonobstructive bowel gas  pattern. Electronically Signed   By: Ulyses Jarred M.D.   On: 06/10/2020 01:07   CT HEAD WO CONTRAST  Addendum Date: 06/09/2020   ADDENDUM REPORT: 06/09/2020 22:24 ADDENDUM: These results were called by telephone at the time of interpretation on 06/09/2020 at 10:13 pm to provider Dr. Alvino Chapel, who verbally acknowledged these results. Electronically Signed   By: Primitivo Gauze M.D.   On: 06/09/2020 22:24   Result Date: 06/09/2020 CLINICAL DATA:  Mental status change, unknown cause. EXAM: CT HEAD WITHOUT CONTRAST TECHNIQUE: Contiguous axial images were obtained from the base of the skull through the vertex without intravenous contrast. COMPARISON:  12/18/2019 MRI/MRA head.  12/12/2019 head CT. FINDINGS: Brain: Redemonstration of infiltrative hypodensity involving the left temporal lobe. Partial effacement of the left lateral ventricle, increased when compared to prior MRI. Abutment of the left midbrain by the mesial left temporal lobe appears more conspicuous. New rightward midline shift of 4 mm. No ventriculomegaly or extra-axial fluid collection. No intracranial hemorrhage. Vascular: No hyperdense vessel or unexpected calcification. Skull: No acute finding. Sinuses/Orbits: Normal orbits. Clear paranasal sinuses. No mastoid effusion. Other: None. IMPRESSION: Redemonstration of infiltrative left temporal lesion. Increased partial effacement of the left lateral ventricle and abutment of the left midbrain. New rightward midline shift of 4 mm. MRI head with and without contrast is recommended for better evaluation. Electronically Signed: By: Primitivo Gauze M.D. On: 06/09/2020 22:06   CT CHEST W CONTRAST  Result Date: 06/10/2020 CLINICAL DATA:  Brain/CNS neoplasm. Evaluate for metastatic disease/primary neoplasm. EXAM: CT CHEST, ABDOMEN, AND PELVIS WITH CONTRAST TECHNIQUE: Multidetector CT imaging of the chest, abdomen and pelvis was performed following the standard protocol during bolus  administration of intravenous contrast. CONTRAST:  142mL OMNIPAQUE IOHEXOL 300 MG/ML  SOLN COMPARISON:  Brain MRI, 06/10/2020. Abdomen radiographs, 06/10/2020. FINDINGS: CT CHEST FINDINGS Cardiovascular: Heart normal in size and configuration. No pericardial effusion. No coronary artery calcifications. Normal great vessels and widely patent aortic arch branch vessels. Mediastinum/Nodes: Normal thyroid. No neck base, axillary, mediastinal or hilar masses or enlarged lymph nodes. Normal trachea and esophagus. Lungs/Pleura: Minor dependent atelectasis. Calcified granuloma in the left upper lobe  lingula. No evidence of pneumonia or pulmonary edema. No lung mass or suspicious nodule. No pleural effusion or pneumothorax. Musculoskeletal: No fracture or bone lesion. No significant skeletal abnormality. No chest wall mass. CT ABDOMEN PELVIS FINDINGS Hepatobiliary: No focal liver abnormality is seen. No gallstones, gallbladder wall thickening, or biliary dilatation. Pancreas: Unremarkable. No pancreatic ductal dilatation or surrounding inflammatory changes. Spleen: Normal in size without focal abnormality. Adrenals/Urinary Tract: Normal adrenal glands. Kidneys normal in size, orientation and position with symmetric enhancement and excretion. No renal masses. No hydronephrosis. Normal ureters. Bladder unremarkable. Stomach/Bowel: Stomach is within normal limits. Appendix appears normal. No evidence of bowel wall thickening, distention, or inflammatory changes. Vascular/Lymphatic: No significant vascular findings are present. No enlarged abdominal or pelvic lymph nodes. Reproductive: Prostate mildly enlarged, 4.8 x 3.5 cm transversely. Other: No abdominal wall hernia or abnormality. No abdominopelvic ascites. Musculoskeletal: Chronic bilateral pars defects at L5-S1. Minimal anterolisthesis. Skeletal structures otherwise unremarkable. IMPRESSION: 1. No evidence of a primary malignancy or metastatic disease within the chest,  abdomen or pelvis. 2. No acute findings within the chest, abdomen or pelvis. Electronically Signed   By: Lajean Manes M.D.   On: 06/10/2020 09:52   MR Brain W and Wo Contrast  Result Date: 06/10/2020 CLINICAL DATA:  Brain mass EXAM: MRI HEAD WITHOUT AND WITH CONTRAST TECHNIQUE: Multiplanar, multiecho pulse sequences of the brain and surrounding structures were obtained without and with intravenous contrast. CONTRAST:  7.108mL GADAVIST GADOBUTROL 1 MMOL/ML IV SOLN COMPARISON:  Brain MRI 12/18/2019 FINDINGS: Brain: No acute infarct, acute hemorrhage or extra-axial collection. Slight worsening of abnormal hyperintense T2-weighted signal within the left temporal lobe. Mass effect on the left lateral ventricle and brainstem is slightly increased. There is no contrast enhancement within the lesion. No chronic microhemorrhage. Normal midline structures. Vascular: Normal flow voids. Skull and upper cervical spine: Normal marrow signal. Sinuses/Orbits: Negative. Other: None. 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 Electronically Signed   By: Ulyses Jarred M.D.   On: 06/10/2020 03:12   CT ABDOMEN PELVIS W CONTRAST  Result Date: 06/10/2020 CLINICAL DATA:  Brain/CNS neoplasm. Evaluate for metastatic disease/primary neoplasm. EXAM: CT CHEST, ABDOMEN, AND PELVIS WITH CONTRAST TECHNIQUE: Multidetector CT imaging of the chest, abdomen and pelvis was performed following the standard protocol during bolus administration of intravenous contrast. CONTRAST:  165mL OMNIPAQUE IOHEXOL 300 MG/ML  SOLN COMPARISON:  Brain MRI, 06/10/2020. Abdomen radiographs, 06/10/2020. FINDINGS: CT CHEST FINDINGS Cardiovascular: Heart normal in size and configuration. No pericardial effusion. No coronary artery calcifications. Normal great vessels and widely patent aortic arch branch vessels. Mediastinum/Nodes: Normal thyroid. No  neck base, axillary, mediastinal or hilar masses or enlarged lymph nodes. Normal trachea and esophagus. Lungs/Pleura: Minor dependent atelectasis. Calcified granuloma in the left upper lobe lingula. No evidence of pneumonia or pulmonary edema. No lung mass or suspicious nodule. No pleural effusion or pneumothorax. Musculoskeletal: No fracture or bone lesion. No significant skeletal abnormality. No chest wall mass. CT ABDOMEN PELVIS FINDINGS Hepatobiliary: No focal liver abnormality is seen. No gallstones, gallbladder wall thickening, or biliary dilatation. Pancreas: Unremarkable. No pancreatic ductal dilatation or surrounding inflammatory changes. Spleen: Normal in size without focal abnormality. Adrenals/Urinary Tract: Normal adrenal glands. Kidneys normal in size, orientation and position with symmetric enhancement and excretion. No renal masses. No hydronephrosis. Normal ureters. Bladder unremarkable. Stomach/Bowel: Stomach is within normal limits. Appendix appears normal. No evidence of bowel wall thickening, distention, or inflammatory  changes. Vascular/Lymphatic: No significant vascular findings are present. No enlarged abdominal or pelvic lymph nodes. Reproductive: Prostate mildly enlarged, 4.8 x 3.5 cm transversely. Other: No abdominal wall hernia or abnormality. No abdominopelvic ascites. Musculoskeletal: Chronic bilateral pars defects at L5-S1. Minimal anterolisthesis. Skeletal structures otherwise unremarkable. IMPRESSION: 1. No evidence of a primary malignancy or metastatic disease within the chest, abdomen or pelvis. 2. No acute findings within the chest, abdomen or pelvis. Electronically Signed   By: Lajean Manes M.D.   On: 06/10/2020 09:52      Scheduled Meds: . clonazePAM  2 mg Oral TID  . dexamethasone  4 mg Oral Q6H   Followed by  . [START ON 06/14/2020] dexamethasone  4 mg Oral Q8H   Followed by  . [START ON 06/16/2020] dexamethasone  4 mg Oral Q12H   Followed by  . [START ON  06/18/2020] dexamethasone  4 mg Oral Daily  . lacosamide  200 mg Oral BID  . levETIRAcetam  1,500 mg Oral BID  . phenytoin  100 mg Oral TID   Continuous Infusions:    LOS: 3 days      Debbe Odea, MD [facility] Pager: [email or web address] 06/13/2020, 11:09 AM

## 2020-06-13 NOTE — Progress Notes (Signed)
LTM discontinued; no skin breakdown was seen; notified [facility].

## 2020-06-13 NOTE — Progress Notes (Signed)
Subjective: No further clinical seizures overnight.  Patient states he is feeling better.  Patient's brother at bedside states he is improved but is still not at his baseline.  ROS: negative except above  Examination  Vital signs in last 24 hours: Temp:  [97.5 F (36.4 C)-98 F (36.7 C)] 97.5 F (36.4 C) (11/02 1155) Pulse Rate:  [79-96] 96 (11/02 1155) Resp:  [16-20] 18 (11/02 1155) BP: (107-124)/(70-84) 113/70 (11/02 1155) SpO2:  [99 %-100 %] 100 % (11/02 1155)  General: lying in bed, NAD CVS: pulse-normal rate and rhythm RS: breathing comfortably, CTAB Extremities: normal, warm  Neuro: MS: Alert, oriented to place and person, follows simple one-step commands, able to name objects, able to repeat sentences like it does not need outside, able to tell me days of the week forward and backwards with some encouragement and repetition but perseverates at times CN: pupils equal and reactive,  EOMI, face symmetric, tongue midline, normal sensation over face Motor: 4/5 in RUE/RLE, 5/5 in LUE/LLE Sensory: intact to light touch BL  Basic Metabolic Panel: Recent Labs  Lab 06/09/20 2055 06/09/20 2055 06/09/20 2111 06/10/20 0309 06/10/20 0309 06/10/20 0727 06/10/20 0727 06/10/20 1337 06/10/20 2317 06/11/20 0433 06/11/20 0842 06/11/20 1144 06/12/20 0144 06/12/20 1349 06/13/20 0430  NA 126*   < > 126* 125*   < > 122*   < > 125*   < > 129* 129*  --  134* 136 135  K 3.8   < > 3.9 3.6  --  3.5  --  4.0  --   --   --   --   --   --  4.4  CL 90*   < > 92* 92*  --  88*  --  90*  --   --   --   --   --   --  98  CO2 25  --   --  24  --  25  --  27  --   --   --   --   --   --  28  GLUCOSE 142*   < > 138* 121*  --  122*  --  118*  --   --   --   --   --   --  116*  BUN 12   < > 13 9  --  8  --  8  --   --   --   --   --   --  14  CREATININE 0.78   < > 0.60* 0.70  --  0.70  --  0.71  --   --   --   --   --   --  1.01  CALCIUM 8.9   < >  --  8.3*   < > 8.4*  --  8.6*  --   --   --   --    --   --  9.1  MG  --   --   --   --   --   --   --   --   --   --   --  2.0  --   --   --   PHOS  --   --   --   --   --   --   --   --   --   --   --   --   --   --  4.6   < > = values  in this interval not displayed.    CBC: Recent Labs  Lab 06/09/20 2055 06/09/20 2111 06/10/20 0309  WBC 9.1  --  9.6  NEUTROABS 7.7  --  7.5  HGB 14.9 14.6 13.1  HCT 43.6 43.0 38.2*  MCV 82.1  --  83.4  PLT 307  --  283    Coagulation Studies: No results for input(s): LABPROT, INR in the last 72 hours.  Imaging No new brain imaging  ASSESSMENT AND PLAN: 59 year old male who presented with seizures.  MRI brain showed left temporal brain mass with mass-effect.  EEG showed frequent seizures, improved after being on 4 AEDs.  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  Clinically also patient appears to be improving.  Although still has some difficulties with perseveration and attention.  Recommendations -Continue Keppra 1500 mg twice daily, lacosamide 200 mg twice daily, Dilantin 100 mg every 8 hours and Klonopin 2 mg 3 times daily.  Switched IV AEDs to p.o.  -We will check phenytoin level tomorrow -Continue dexamethasone taper as scheduled -We will discontinue LTM EEG as patient did not have any further seizures -We will contact neurosurgeon Dr. Duffy Rhody to notify him that patient seizures are improved in case he wants to proceed with biopsy -Discussed current management plan with patient, his wife and brother with help of a Spanish interpreter.  Patient's wife states he does not have insurance.  Requested Dr. Wynelle Cleveland to contact social worker to help patient with insurance. -Continue seizure precautions -As needed IV Ativan 2 mg for clinical seizure-like activity -Will likely need follow-up with neuro-oncology Dr. Mickeal Skinner -Management of rest of comorbidities per primary team  I have  spent a total of 18  minuteswith the patient reviewing hospitalnotes,  test results, labs and examining the patient as well as establishing an assessment and plan that was discussed personally with the patient.>50% of time was spent in direct patient care.    [name] Epilepsy [facility] For questions after 5pm please refer to AMION to reach the Neurologist on call

## 2020-06-13 NOTE — Progress Notes (Signed)
Subjective: Patient not completely oriented but denies any problems.  Sodium continues to improve and now normalized today.  Objective Vital signs in last 24 hours: Vitals:   06/12/20 2104 06/13/20 0018 06/13/20 0344 06/13/20 0910  BP: 107/71 119/84 116/80 124/82  Pulse: 81 79 80 83  Resp: 16 18 18 20   Temp: 98 F (36.7 C) 98 F (36.7 C) 98 F (36.7 C) (!) 97.5 F (36.4 C)  TempSrc: Oral Oral Oral Oral  SpO2: 99% 100% 100% 99%  Weight:      Height:       Weight change:   Intake/Output Summary (Last 24 hours) at 06/13/2020 1059 Last data filed at 06/13/2020 5885 Gross per 24 hour  Intake 240 ml  Output 325 ml  Net -85 ml    Assessment/Plan: 59 year old Hispanic male with brain mass and hyponatremia  1.  Brain mass - full body imaging does not show any primary focus of malignancy.  Neurosurgery is concerned this could represent a glioma.  Continued work-up via appropriate services 2.  Seizures-I feel that the brain mass as opposed to the hyponatremia is probably responsible for the seizure issue.  Neurology on board, started on [place] 3.  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  Now significantly improved.  We will stop his Lasix at this time.   -We will sign off at this time given his normal sodium -Continue to monitor sodium daily for the next 5 days if he remains inpatient -If sodium drops lower than 132 can start oral Lasix 20 mg daily  If any further questions arise please do not hesitate to contact nephrology.    Reesa Chew    Labs: Basic Metabolic Panel: Recent Labs  Lab 06/10/20 0727 06/10/20 0727 06/10/20 1337 06/10/20 2317 06/12/20 0144 06/12/20 1349 06/13/20 0430  NA 122*   < > 125*   < > 134* 136 135  K 3.5  --  4.0  --   --   --  4.4  CL 88*  --  90*  --   --   --  98  CO2 25  --  27  --   --   --  28  GLUCOSE 122*  --  118*  --   --   --  116*  BUN 8  --  8  --   --   --  14  CREATININE 0.70  --  0.71  --   --   --  1.01  CALCIUM 8.4*  --   8.6*  --   --   --  9.1  PHOS  --   --   --   --   --   --  4.6   < > = values in this interval not displayed.   Liver Function Tests: Recent Labs  Lab 06/09/20 2055 06/13/20 0430  AST 30  --   ALT 23  --   ALKPHOS 55  --   BILITOT 1.0  --   PROT 7.7  --   ALBUMIN 4.5 3.6   No results for input(s): LIPASE, AMYLASE in the last 168 hours. No results for input(s): AMMONIA in the last 168 hours. CBC: Recent Labs  Lab 06/09/20 2055 06/09/20 2111 06/10/20 0309  WBC 9.1  --  9.6  NEUTROABS 7.7  --  7.5  HGB 14.9 14.6 13.1  HCT 43.6 43.0 38.2*  MCV 82.1  --  83.4  PLT 307  --  283   Cardiac Enzymes: No results  for input(s): CKTOTAL, CKMB, CKMBINDEX, TROPONINI in the last 168 hours. CBG: Recent Labs  Lab 06/09/20 2019  GLUCAP 129*    Iron Studies: No results for input(s): IRON, TIBC, TRANSFERRIN, FERRITIN in the last 72 hours. Studies/Results: No results found. Medications: Infusions:   Scheduled Medications: . clonazePAM  2 mg Oral TID  . dexamethasone  4 mg Oral Q6H   Followed by  . [START ON 06/14/2020] dexamethasone  4 mg Oral Q8H   Followed by  . [START ON 06/16/2020] dexamethasone  4 mg Oral Q12H   Followed by  . [START ON 06/18/2020] dexamethasone  4 mg Oral Daily  . lacosamide  200 mg Oral BID  . levETIRAcetam  1,500 mg Oral BID  . phenytoin  100 mg Oral TID    have reviewed scheduled and prn medications.  Physical Exam: General: Feels well today, lying in bed, no distress, oriented to person and place but not situation Heart: RRR Lungs: mostly clear Abdomen: soft, non tender Extremities: no edema    06/13/2020,10:59 AM  LOS: 3 days

## 2020-06-13 NOTE — Progress Notes (Signed)
Subjective: Patient reports no language difficulties currently.  Objective: Vital signs in last 24 hours: Temp:  [97.5 F (36.4 C)-98 F (36.7 C)] 97.5 F (36.4 C) (11/02 1549) Pulse Rate:  [79-96] 92 (11/02 1549) Resp:  [16-20] 20 (11/02 1549) BP: (107-124)/(70-84) 116/82 (11/02 1549) SpO2:  [98 %-100 %] 98 % (11/02 1549)  Intake/Output from previous day: 11/01 0701 - 11/02 0700 In: 240 [P.O.:240] Out: -  Intake/Output this shift: Total I/O In: -  Out: 325 [Urine:325]  NAD.  Some drowsiness. No pronator drift.  Speech fluent but slow  Lab Results: No results for input(s): WBC, HGB, HCT, PLT in the last 72 hours. BMET Recent Labs    06/12/20 1349 06/13/20 0430  NA 136 135  K  --  4.4  CL  --  98  CO2  --  28  GLUCOSE  --  116*  BUN  --  14  CREATININE  --  1.01  CALCIUM  --  9.1    Studies/Results: No results found.  Assessment/Plan: Let temporal lobe lesion, epilepsy - I discussed with the patient and family at bedside in Spanish our recommendation of biopsy of his left temporal lobe lesion given slight progression seen over last 6 months.  Risks, benefits, alternatives, and expected convalescence were discussed.   Resective surgery could certainly be a possibility in the future depending on the pathology results.  His case was discussed at tumor board.  Will obtain planning MRI for stereotactic brain biopsy on Thursday.  All questions and concerns were answered.   Hunter Terry 06/13/2020, 4:43 PM

## 2020-06-13 NOTE — Procedures (Signed)
Patient Name:Hunter Terry SWF:[id number] Epilepsy Attending:Mariano Doshi Barbra Sarks Referring [place], [place] Duration:06/12/2020 1205 to 06/13/2020 2202  Patient history:59yo M with ams. EEG to evaluate for seizure.  Level of alertness:Awake, asleep  AEDs during EEG study:LEV PHT, Vimpat, Clonopin, Dexamethasone  Technical aspects: This EEG study was done with scalp electrodes positioned according to the 10-20 International system of electrode placement. Electrical activity was acquired at a sampling rate of 500Hz  and reviewed with a high frequency filter of 70Hz  and a low frequency filter of 1Hz . EEG data were recorded continuously and digitally stored.   Description: The posterior dominant rhythm consists of 9 Hz activity of moderate voltage (25-35 uV) seen predominantly in posterior head regions, symmetric and reactive to eye opening and eye closing. Sleep was characterized by vertex waves, sleep spindles (12 to 14 Hz), maximal frontocentral region. EEG showed continuous 3 to 6 Hz theta-delta slowing in left frontotemporal region as well as 15 to 18 Hz generalized beta activity was also noted.    ABNORMALITY -Continuous slow, left frontotemporal region - Excessive beta, generalized  IMPRESSION: This study is suggestive of cortical dysfunction in left frontotemporal region consistent with underlying mass.No definite seizures or epileptiform discharges were seen during this study.  Study is significantly improved from previous day.  Khadija Thier Barbra Sarks

## 2020-06-14 ENCOUNTER — Inpatient Hospital Stay (HOSPITAL_COMMUNITY): Payer: Self-pay

## 2020-06-14 LAB — RENAL FUNCTION PANEL
Albumin: 3.5 g/dL (ref 3.5–5.0)
Anion gap: 10 (ref 5–15)
BUN: 13 mg/dL (ref 6–20)
CO2: 27 mmol/L (ref 22–32)
Calcium: 9.1 mg/dL (ref 8.9–10.3)
Chloride: 99 mmol/L (ref 98–111)
Creatinine, Ser: 0.79 mg/dL (ref 0.61–1.24)
GFR, Estimated: >60 mL/min (ref >60)
Glucose, Bld: 129 mg/dL — ABNORMAL HIGH (ref 70–99)
Phosphorus: 4.2 mg/dL (ref 2.5–4.6)
Potassium: 4.2 mmol/L (ref 3.5–5.1)
Sodium: 136 mmol/L (ref 135–145)

## 2020-06-14 LAB — MRSA PCR SCREENING: MRSA by PCR: NEGATIVE

## 2020-06-14 LAB — PHENYTOIN LEVEL, TOTAL: Phenytoin Lvl: 16.8 ug/mL (ref 10.0–20.0)

## 2020-06-14 IMAGING — MR MR HEAD W/O CM
12 of 13 series · 37 of 48 positions shown · non-contrast
Comparison: Brain MRI [DATE]. Head CT [DATE]. Brain MRI
[DATE].

CLINICAL DATA: Brain mass or lesion.

EXAM:
MRI HEAD WITHOUT CONTRAST
TECHNIQUE: Multiplanar, multiecho pulse sequences of the brain and surrounding
structures were obtained without intravenous contrast.

[Series 3: FLAIR · sagittal · 3.0mm · 0.47mm/px · 1 of 43 slices shown (1 of 2)]
[im 1/43]
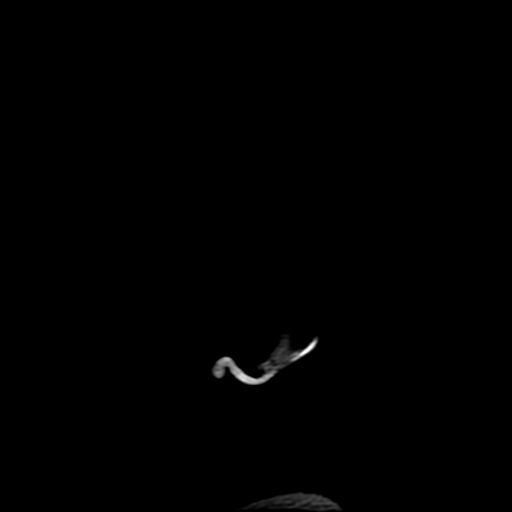

[Series 4: T2 · axial · 5.0mm · 0.43mm/px · 1 of 32 slices shown]
[im 1/32]
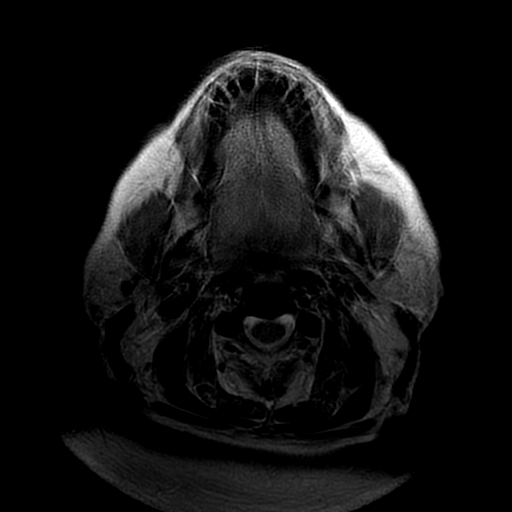

[Series 5: FLAIR · sagittal · 3.0mm · 0.47mm/px · 1 of 43 slices shown (2 of 2)]
[im 1/43]
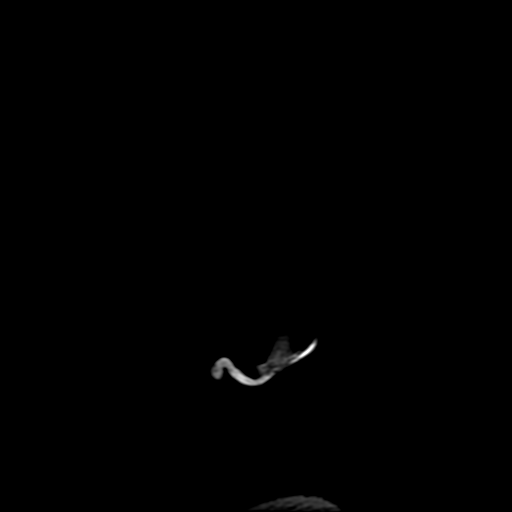

[Series 6: ax dti · axial · 3.0mm · 0.94mm/px · z∈[-13,+145]mm · 16 of 1638 slices shown]
[im 1/1638]
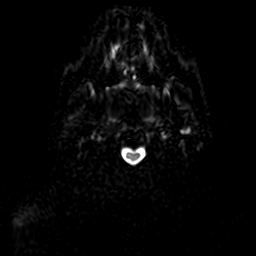
[im 110/1638]
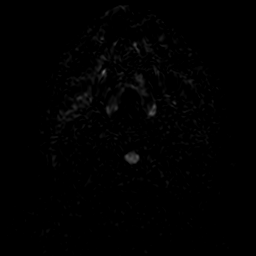
[im 219/1638]
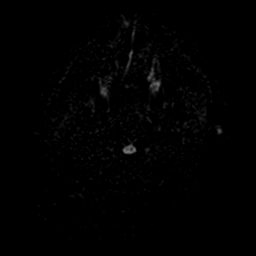
[im 328/1638]
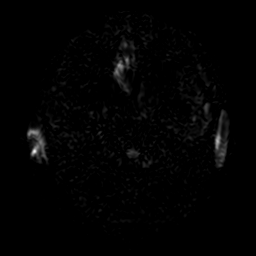
[im 437/1638]
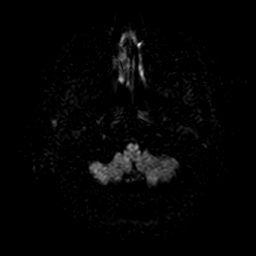
[im 546/1638]
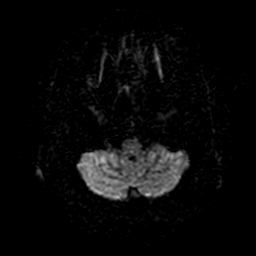
[im 655/1638]
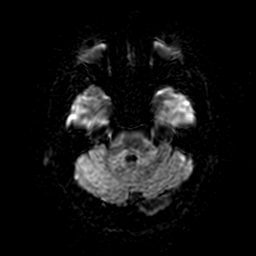
[im 764/1638]
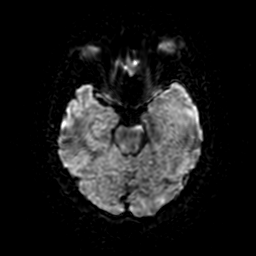
[im 874/1638]
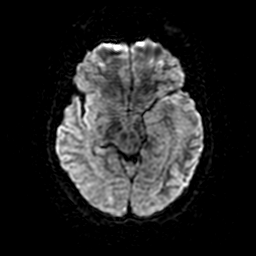
[im 983/1638]
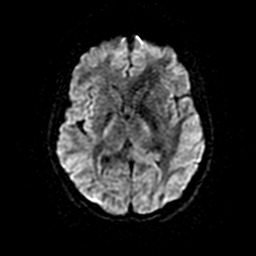
[im 1092/1638]
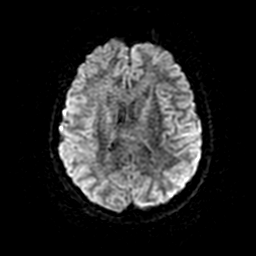
[im 1201/1638]
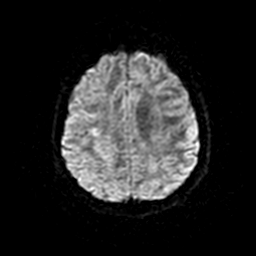
[im 1310/1638]
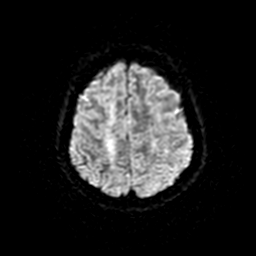
[im 1419/1638]
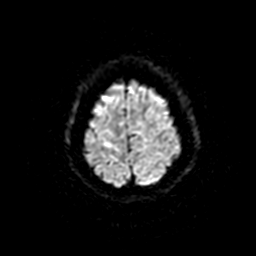
[im 1528/1638]
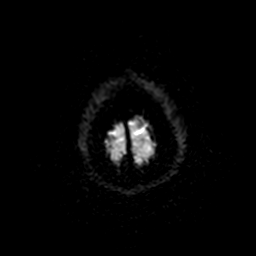
[im 1638/1638]
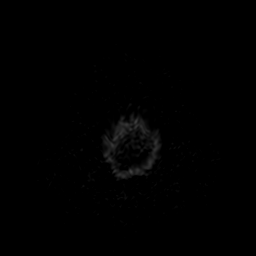

[Series 8: (person_name) · axial · 3.0mm · 0.47mm/px · 1 of 116 slices shown]
[im 1/116]
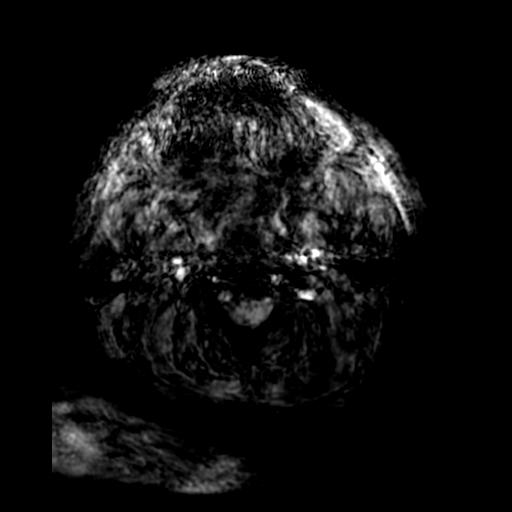

[Series 610: orig: ax dti · axial · 3.0mm · 0.94mm/px · z∈[-3,+145]mm · 8 of 1634 slices shown]
[im 109/1634]
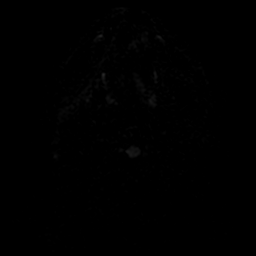
[im 327/1634]
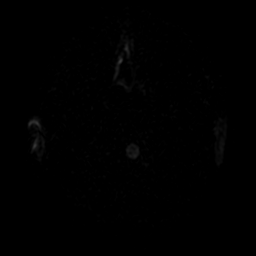
[im 545/1634]
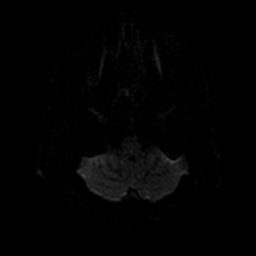
[im 763/1634]
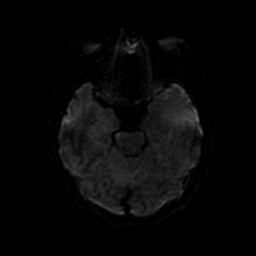
[im 980/1634]
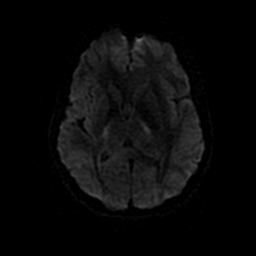
[im 1198/1634]
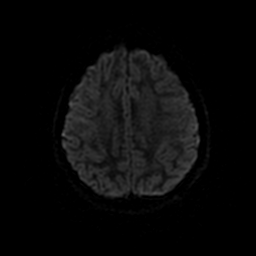
[im 1416/1634]
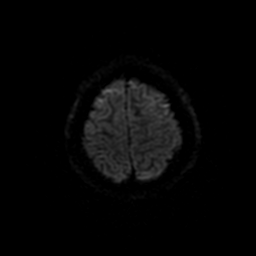
[im 1634/1634]
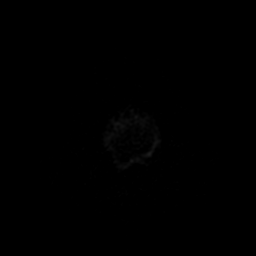

[Series 650: trace · axial · 3.0mm · 0.94mm/px · 1 of 63 slices shown]
[im 1/63]
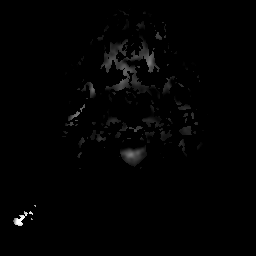

[Series 651: fa(no-q) · axial · 3.0mm · 0.94mm/px · 1 of 58 slices shown]
[im 1/58]
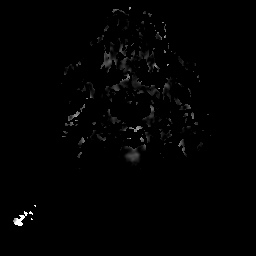

[Series 652: avdc (10^-6 mm²/s)(no-q) · axial · 3.0mm · 0.94mm/px · 1 of 63 slices shown]
[im 1/63]
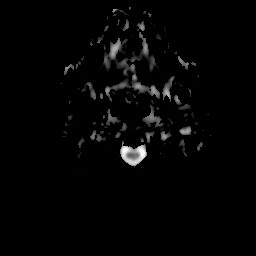

[Series 700: multiplanar reconstruction (mpr) · axial · 1.0mm · 0.50mm/px · z∈[-42,+176]mm · 2 of 257 slices shown (1 of 2)]
[im 1/257]
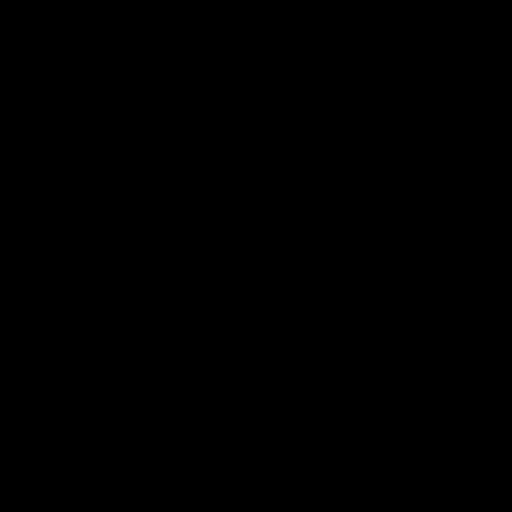
[im 257/257]
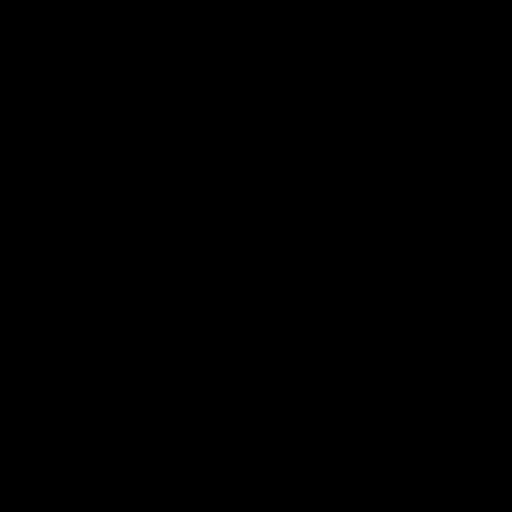

[Series 701: multiplanar reconstruction (mpr) · coronal · 1.0mm · 0.50mm/px · 3 of 244 slices shown (2 of 2)]
[im 1/244]
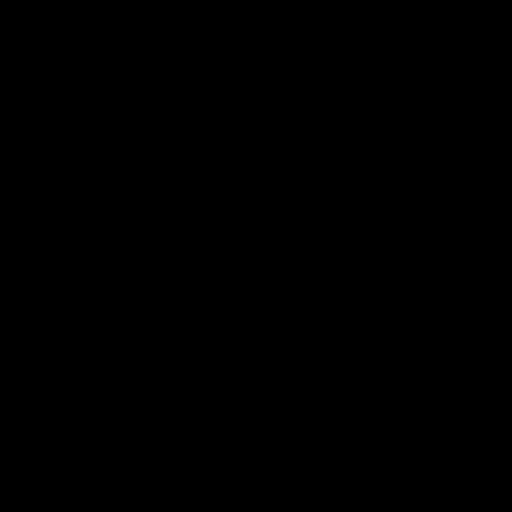
[im 122/244]
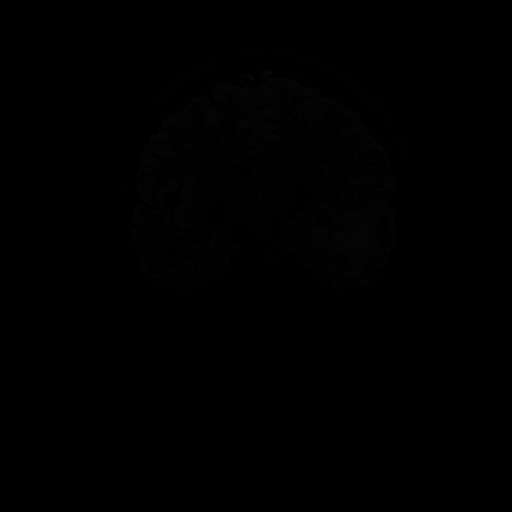
[im 244/244]
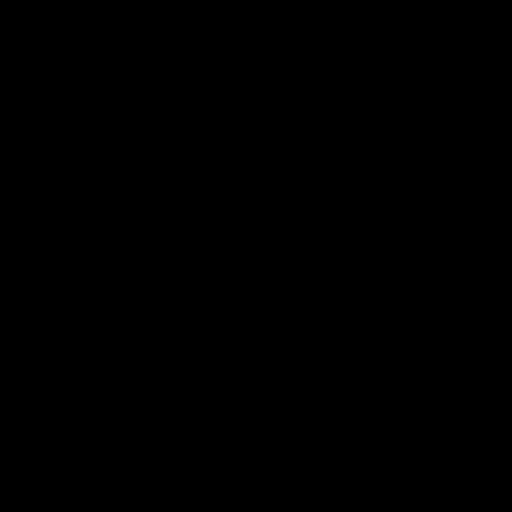

[Series 800: filt_pha: (person_name) · axial · 3.0mm · 0.47mm/px · 1 of 116 slices shown]
[im 1/116]
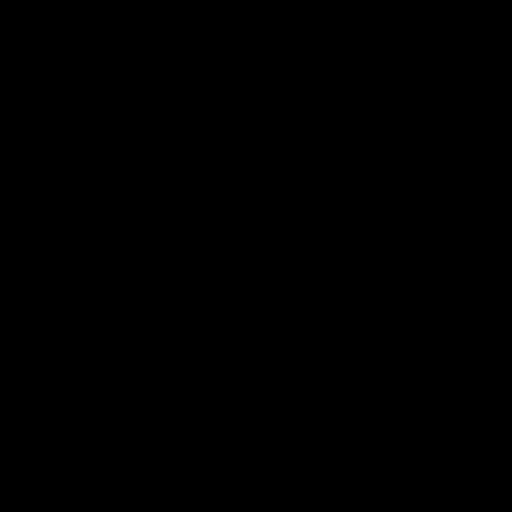

[37 of 48 positions shown; findings below may reference images not displayed]

FINDINGS: Brain:

The patient was unable to tolerate the full examination. Only a
sagittal T1/FLAIR sequence, axial T2 weighted sequence, sagittal 3D
T2/FLAIR sequence, axial DTI sequence, axial trace DWI sequence and
axial SWI could be obtained.

The acquired sequences are significantly motion degraded. Most
notably, there is moderate motion degradation of the sagittal 3D
T2/FLAIR sequence and moderate motion degradation of the axial SWI
sequence.

As compared to the recent prior MRI of [DATE], no signified
interval change in extent of a large infiltrative T2/FLAIR
hyperintense mass involving much of the left temporal lobe. As
before, there is involvement of both the cortex and white matter,
and of the left hippocampus. Additionally, there is extension of
signal abnormality into the left temporal stem and into the
posterior left subinsular white matter. Unchanged partial effacement
of the left lateral ventricle. Unchanged mass effect upon the left
aspect of the midbrain. 2 mm rightward midline shift is also
unchanged.

Stable background mild multifocal T2/FLAIR hyperintensity within the
cerebral white matter which is nonspecific, but compatible with
chronic small vessel ischemic disease.

There is no acute infarct.

No chronic intracranial blood products are identified.

No extra-axial fluid collection.

Vascular: Expected proximal arterial flow voids.

Skull and upper cervical spine: No focal marrow lesion.

Sinuses/Orbits: Visualized orbits show no acute finding. Trace
ethmoid sinus mucosal thickening. No significant mastoid effusion.
IMPRESSION: Prematurely terminated and motion degraded examination as described.

As compared to the prior MRI of [DATE], unchanged extent of a
large infiltrative T2/FLAIR hyperintense mass involving much of the
left temporal lobe. As before, there is involvement of both the
cortex and white matter, and of the left hippocampus. Unchanged
extension of signal abnormality into the left temporal stem and into
the posterior left subinsular white matter.

Unchanged partial effacement of the left lateral ventricle, mass
effect on the left aspect of the midbrain and 2 mm rightward midline
shift.

## 2020-06-14 MED ORDER — LORAZEPAM 2 MG/ML IJ SOLN: 1.0000 mg | INTRAMUSCULAR | Status: DC

## 2020-06-14 MED ORDER — CEFAZOLIN SODIUM-DEXTROSE 2-4 GM/100ML-% IV SOLN
2.0000 g | INTRAVENOUS | Status: AC
  Administered 2020-06-15: 2 g via INTRAVENOUS

## 2020-06-14 NOTE — Progress Notes (Signed)
Subjective: Patient reports no neurologic changes  Objective: Vital signs in last 24 hours: Temp:  [97.5 F (36.4 C)-98.3 F (36.8 C)] 98.3 F (36.8 C) (11/03 0822) Pulse Rate:  [70-102] 102 (11/03 0822) Resp:  [16-20] 18 (11/03 0822) BP: (113-127)/(70-84) 120/78 (11/03 0822) SpO2:  [98 %-100 %] 100 % (11/03 0822)  Intake/Output from previous day: 11/02 0701 - 11/03 0700 In: -  Out: 1050 [Urine:1050] Intake/Output this shift: Total I/O In: 240 [P.O.:240] Out: -   More drowsy and confused today, oriented to person, year. FC x 4, no drift  Lab Results: No results for input(s): WBC, HGB, HCT, PLT in the last 72 hours. BMET Recent Labs    06/13/20 0430 06/14/20 0252  NA 135 136  K 4.4 4.2  CL 98 99  CO2 28 27  GLUCOSE 116* 129*  BUN 14 13  CREATININE 1.01 0.79  CALCIUM 9.1 9.1    Studies/Results: No results found.  Assessment/Plan: 59 yo M with epilepsy and left temporal lobe lesion - plan on stereotactic biopsy tomorrow   Hunter Terry 06/14/2020, 11:17 AM

## 2020-06-14 NOTE — TOC Initial Note (Signed)
Transition of Care [facility]) - Initial/Assessment Note    Patient Details  Name: Hunter Terry MRN: [id number] Date of Birth: 21-Oct-1960  Transition of Care [facility] CM/SW Contact:    Pollie Friar, RN Phone Number: 06/14/2020, 4:15 PM  Clinical Narrative:                 CM met with the patient and his wife and asked Graciela (interpretor) to translate. 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 CM has sent email to financial counseling to try and have him seen prior to d/c.  Spouse is interested in one of the [facility] for PCP even though it will be a drive. They also want to get into [facility] pharmacy for medication assistance. CM will follow for appt.  CM has reached out to the Vimpat rep to see about assistance for the patient.  TOC following.  Expected Discharge Plan: Home/Self Care Barriers to Discharge: Inadequate or no insurance, Continued Medical Work up   Patient Goals and CMS Choice     Choice offered to / list presented to : Spouse  Expected Discharge Plan and Services Expected Discharge Plan: Home/Self Care   Discharge Planning Services: CM Consult   Living arrangements for the past 2 months: Single Family Home                                      Prior Living Arrangements/Services Living arrangements for the past 2 months: Single Family Home Lives with:: Spouse, Minor Children Patient language and need for interpreter reviewed:: Yes (Spanish) Do you feel safe going back to the place where you live?: Yes      Need for Family Participation in Patient Care: Yes (Comment) Care giver support system in place?: Yes (comment)   Criminal Activity/Legal Involvement Pertinent to Current Situation/Hospitalization: No - Comment as needed  Activities of Daily Living Home Assistive Devices/Equipment: None ADL Screening (condition at time of admission) Patient's cognitive ability adequate  to safely complete daily activities?: Yes Is the patient deaf or have difficulty hearing?: No Does the patient have difficulty seeing, even when wearing glasses/contacts?: No Does the patient have difficulty concentrating, remembering, or making decisions?: No Patient able to express need for assistance with ADLs?: Yes Does the patient have difficulty dressing or bathing?: No Independently performs ADLs?: Yes (appropriate for developmental age) Does the patient have difficulty walking or climbing stairs?: No Weakness of Legs: None Weakness of Arms/Hands: None  Permission Sought/Granted                  Emotional Assessment Appearance:: Appears stated age         Psych Involvement: No (comment)  Admission diagnosis:  Brain tumor ([place]) [D49.6] Disorientation [R41.0] Brain mass [G93.89] Patient Active Problem List   Diagnosis Date Noted  . Brain mass 06/10/2020  . Hyponatremia 06/10/2020   PCP:  Patient, No Pcp Per Pharmacy:  No Pharmacies Listed    Social Determinants of Health (SDOH) Interventions    Readmission Risk Interventions No flowsheet data found.

## 2020-06-14 NOTE — Progress Notes (Signed)
Pt stated that due to religious reason he does not consent to blood products.

## 2020-06-14 NOTE — Progress Notes (Signed)
Subjective: No acute abnormality overnight.  No clinical seizures.  ROS: negative except above  Examination  Vital signs in last 24 hours: Temp:  [97.5 F (36.4 C)-98.3 F (36.8 C)] 98.3 F (36.8 C) (11/03 7106) Pulse Rate:  [70-102] 102 (11/03 0822) Resp:  [16-20] 18 (11/03 0822) BP: (113-127)/(70-84) 120/78 (11/03 0822) SpO2:  [98 %-100 %] 100 % (11/03 0822)  General: lying in bed, NAD CVS: pulse-normal rate and rhythm RS: breathing comfortably, CTAB Extremities: normal, warm  Neuro: MS: Alert, orientedto place and person, follows simple one-step commands, able to name objects, able to tell me the months, not able to do 100-7 CN: pupils equal and reactive, EOMI, face symmetric, tongue midline, normal sensation over face Motor:4/5 in RUE/RLE, 5/5 in LUE/LLE Sensory: intact to light touch BL  Basic Metabolic Panel: Recent Labs  Lab 06/10/20 0309 06/10/20 0309 06/10/20 0727 06/10/20 0727 06/10/20 1337 06/10/20 2317 06/11/20 0842 06/11/20 1144 06/12/20 0144 06/12/20 1349 06/13/20 0430 06/14/20 0252  NA 125*   < > 122*   < > 125*   < > 129*  --  134* 136 135 136  K 3.6  --  3.5  --  4.0  --   --   --   --   --  4.4 4.2  CL 92*  --  88*  --  90*  --   --   --   --   --  98 99  CO2 24  --  25  --  27  --   --   --   --   --  28 27  GLUCOSE 121*  --  122*  --  118*  --   --   --   --   --  116* 129*  BUN 9  --  8  --  8  --   --   --   --   --  14 13  CREATININE 0.70  --  0.70  --  0.71  --   --   --   --   --  1.01 0.79  CALCIUM 8.3*   < > 8.4*   < > 8.6*  --   --   --   --   --  9.1 9.1  MG  --   --   --   --   --   --   --  2.0  --   --   --   --   PHOS  --   --   --   --   --   --   --   --   --   --  4.6 4.2   < > = values in this interval not displayed.    CBC: Recent Labs  Lab 06/09/20 2055 06/09/20 2111 06/10/20 0309  WBC 9.1  --  9.6  NEUTROABS 7.7  --  7.5  HGB 14.9 14.6 13.1  HCT 43.6 43.0 38.2*  MCV 82.1  --  83.4  PLT 307  --  283      Coagulation Studies: No results for input(s): LABPROT, INR in the last 72 hours.  Imaging No new brain imaging  ASSESSMENT AND PLAN: 59 year old male who presented with seizures. MRI brain showed left temporal brain mass with mass-effect. EEG showed frequent seizures, improved after being on 4 AEDs.  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 Clinically also patient appears to be  improving.  Recommendations -Continue Keppra 1500 mg twice daily, lacosamide 200 mg twice daily, Dilantin 100 mg every 8 hours and Klonopin 2 mg 3 times daily.   -Continue dexamethasone taper as scheduled - Plan for biopsy tomorrow -Discussed current management plan with patient, his wife with help of a Spanish interpreter.  - Requested case manager to help patient with insurance, AEds. -Continue seizure precautions -As needed IV Ativan 2 mg for clinical seizure-like activity -Willlikely need follow-up with neuro-oncology Dr. Mickeal Skinner -Management of rest of comorbidities per primary team  I have spent a total of83minuteswith the patient reviewing hospitalnotes, test results, labs and examining the patient as well as establishing an assessment and plan that was discussed personally with the patient.>50% of time was spent in direct patient care.    [name] Epilepsy [facility] For questions after 5pm please refer to AMION to reach the Neurologist on call

## 2020-06-14 NOTE — Anesthesia Preprocedure Evaluation (Addendum)
Anesthesia Evaluation  Patient identified by MRN, date of birth, ID band Patient awake    Reviewed: Allergy & Precautions, NPO status , Patient's Chart, lab work & pertinent test results  Airway Mallampati: III  TM Distance: >3 FB Neck ROM: Full    Dental no notable dental hx.    Pulmonary neg pulmonary ROS,    Pulmonary exam normal breath sounds clear to auscultation       Cardiovascular negative cardio ROS Normal cardiovascular exam Rhythm:Regular Rate:Normal  ECG: NSR, rate 74   Neuro/Psych Confusion Seizures  Left temporal lesion negative psych ROS   GI/Hepatic negative GI ROS, Neg liver ROS,   Endo/Other  negative endocrine ROS  Renal/GU negative Renal ROS     Musculoskeletal negative musculoskeletal ROS (+)   Abdominal   Peds  Hematology negative hematology ROS (+)   Anesthesia Other Findings Brain tumor  Reproductive/Obstetrics                            Anesthesia Physical Anesthesia Plan  ASA: II  Anesthesia Plan: General   Post-op Pain Management:    Induction: Intravenous  PONV Risk Score and Plan: 2 and Ondansetron, Dexamethasone and Treatment may vary due to age or medical condition  Airway Management Planned: Oral ETT  Additional Equipment: Arterial line  Intra-op Plan:   Post-operative Plan: Extubation in OR  Informed Consent: I have reviewed the patients History and Physical, chart, labs and discussed the procedure including the risks, benefits and alternatives for the proposed anesthesia with the patient or authorized representative who has indicated his/her understanding and acceptance.     Dental advisory given and Interpreter used for interveiw  Plan Discussed with: CRNA  Anesthesia Plan Comments:        Anesthesia Quick Evaluation

## 2020-06-14 NOTE — Progress Notes (Signed)
PROGRESS NOTE  [name]  DOB: 11/05/1960  PCP: Patient, No Pcp Per [id number]  DOA: 06/09/2020  LOS: 4 days   Chief Complaint  Patient presents with  . Altered Mental Status   Brief narrative: Hunter Terry  is a 59 y.o. 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  He did not seek any further medical evaluation after the diagnosis was made. Patient was brought to the ED on 10/29 for altered mental status.   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   Patient also had a low sodium level 122. Admitted for further evaluation management.  Subjective: Patient was seen and examined this morning. Middle-aged Hispanic male.  Wife at bedside.  I used in person interpreter.  Assessment/Plan: Brain mass -MRI 10/30 > Slight worsening of infiltrative tumor of the left temporal lobe, with slight worsening of mass effect on the left lateral ventricle and brainstem. -CT chest/abd pelvis reveals no other concerning masses -Neurosurgery consultation appreciated. -MRI brain ordered for today. -Noted a tentative plan of stereotactic biopsy tomorrow on 11/4, followed by possibly 1 night in ICU and then d/c home if stable on 11/5.  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 -Nephrology consultation appreciated. -Was treated with demeclocycline and oral Lasix. -Sodium level subsequently improved and remained stable. 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 -Patient underwent long-term EEG monitoring as well. -Currently on Vimpat, Keppra and  phenytoin. -seizures resolved -Decadron taper started on 11/1 by Neurology  Acute metabolic encephalopathy -During this hospitalization, patient was intermittently agitated, trying to get out of bed, he required restraints.  -Mental status is improving now but patient is only oriented to place at this time.  Mobility: Encourage ambulation Code Status:   Code Status: Full Code  Nutritional status: Body mass index is 26.6 kg/m.     Diet Order            Diet NPO time specified  Diet effective midnight           Diet regular Room service appropriate? Yes; Fluid consistency: Thin  Diet effective now                 DVT prophylaxis: SCD's Start: 06/14/20 1344 SCDs Start: 06/10/20 0309   Antimicrobials:  None Fluid: None Consultants: Neurology, neurosurgery Family Communication:  Wife at bedside  Status is: Inpatient  Remains inpatient appropriate because:Inpatient level of care appropriate due to severity of illness   Dispo: The patient is from: Home              Anticipated d/c is to: Home              Anticipated d/c date is: 2 to 3 days              Patient currently is not medically stable to d/c.       Infusions:  .  ceFAZolin (ANCEF) IV      Scheduled Meds: . clonazePAM  2 mg Oral TID  . dexamethasone  4 mg Oral Q8H   Followed by  . [START ON 06/16/2020] dexamethasone  4 mg Oral  Q12H   Followed by  . [START ON 06/18/2020] dexamethasone  4 mg Oral Daily  . lacosamide  200 mg Oral BID  . levETIRAcetam  1,500 mg Oral BID  . phenytoin  100 mg Oral TID    Antimicrobials: Anti-infectives (From admission, onward)   Start     Dose/Rate Route Frequency Ordered Stop   06/14/20 1343  ceFAZolin (ANCEF) IVPB 2g/100 mL premix        2 g 200 mL/hr over 30 Minutes Intravenous 30 min pre-op 06/14/20 1343     06/11/20 0000  demeclocycline (DECLOMYCIN) tablet 300 mg  Status:  Discontinued        300 mg Per NG tube Every 6 hours 06/10/20 1952 06/10/20 2004    06/11/20 0000  demeclocycline (DECLOMYCIN) tablet 300 mg  Status:  Discontinued        300 mg Oral Every 6 hours 06/10/20 2004 06/12/20 1003   06/10/20 1200  demeclocycline (DECLOMYCIN) tablet 300 mg  Status:  Discontinued        300 mg Oral Every 6 hours 06/10/20 1018 06/10/20 1952      PRN meds: acetaminophen **OR** acetaminophen, LORazepam   Objective: Vitals:   06/14/20 0822 06/14/20 1153  BP: 120/78 118/70  Pulse: (!) 102 79  Resp: 18 18  Temp: 98.3 F (36.8 C) 98.2 F (36.8 C)  SpO2: 100% 99%    Intake/Output Summary (Last 24 hours) at 06/14/2020 1429 Last data filed at 06/14/2020 0900 Gross per 24 hour  Intake 240 ml  Output 725 ml  Net -485 ml   Filed Weights   06/09/20 2013 06/10/20 1300  Weight: 72.6 kg 72.5 kg   Weight change:  Body mass index is 26.6 kg/m.   Physical Exam: General exam: Appears calm and comfortable.  Not in physical distress Skin: No rashes, lesions or ulcers. HEENT: Atraumatic, normocephalic, supple neck, no obvious bleeding Lungs: Clear to auscultation bilaterally CVS: Regular rate and rhythm, no murmur GI/Abd soft, nontender, nondistended, bowel sound present CNS: Alert, awake, oriented to place only. Psychiatry: Depressed look Extremities: No pedal edema, no calf tenderness  Data Review: I have personally reviewed the laboratory data and studies available.  Recent Labs  Lab 06/09/20 2055 06/09/20 2111 06/10/20 0309  WBC 9.1  --  9.6  NEUTROABS 7.7  --  7.5  HGB 14.9 14.6 13.1  HCT 43.6 43.0 38.2*  MCV 82.1  --  83.4  PLT 307  --  283   Recent Labs  Lab 06/10/20 0309 06/10/20 0309 06/10/20 0727 06/10/20 0727 06/10/20 1337 06/10/20 2317 06/11/20 0842 06/11/20 1144 06/12/20 0144 06/12/20 1349 06/13/20 0430 06/14/20 0252  NA 125*   < > 122*   < > 125*   < > 129*  --  134* 136 135 136  K 3.6  --  3.5  --  4.0  --   --   --   --   --  4.4 4.2  CL 92*  --  88*  --  90*  --   --   --   --   --  98 99  CO2 24  --  25   --  27  --   --   --   --   --  28 27  GLUCOSE 121*  --  122*  --  118*  --   --   --   --   --  116* 129*  BUN 9  --  8  --  8  --   --   --   --   --  14 13  CREATININE 0.70  --  0.70  --  0.71  --   --   --   --   --  1.01 0.79  CALCIUM 8.3*  --  8.4*  --  8.6*  --   --   --   --   --  9.1 9.1  MG  --   --   --   --   --   --   --  2.0  --   --   --   --   PHOS  --   --   --   --   --   --   --   --   --   --  4.6 4.2   < > = values in this interval not displayed.   F/u labs ordered.  Signed, Terrilee Croak, MD [facility] 06/14/2020

## 2020-06-15 ENCOUNTER — Encounter (HOSPITAL_COMMUNITY)
Admission: EM | Disposition: A | Discharge status: Home / Self Care | Payer: Self-pay | Source: Home / Self Care | Attending: Internal Medicine

## 2020-06-15 ENCOUNTER — Inpatient Hospital Stay (HOSPITAL_COMMUNITY): Payer: Self-pay | Admitting: Certified Registered Nurse Anesthetist

## 2020-06-15 ENCOUNTER — Encounter (HOSPITAL_COMMUNITY): Payer: Self-pay | Admitting: Internal Medicine

## 2020-06-15 HISTORY — PX: APPLICATION OF CRANIAL NAVIGATION: SHX6578

## 2020-06-15 HISTORY — PX: FRAMELESS  BIOPSY WITH BRAINLAB: SHX6879

## 2020-06-15 LAB — RENAL FUNCTION PANEL
Albumin: 3.6 g/dL (ref 3.5–5.0)
Anion gap: 10 (ref 5–15)
BUN: 13 mg/dL (ref 6–20)
CO2: 25 mmol/L (ref 22–32)
Calcium: 9.2 mg/dL (ref 8.9–10.3)
Chloride: 100 mmol/L (ref 98–111)
Creatinine, Ser: 0.88 mg/dL (ref 0.61–1.24)
GFR, Estimated: >60 mL/min (ref >60)
Glucose, Bld: 134 mg/dL — ABNORMAL HIGH (ref 70–99)
Phosphorus: 4.3 mg/dL (ref 2.5–4.6)
Potassium: 4.4 mmol/L (ref 3.5–5.1)
Sodium: 135 mmol/L (ref 135–145)

## 2020-06-15 LAB — TYPE AND SCREEN
ABO/RH(D): O POS
Antibody Screen: NEGATIVE

## 2020-06-15 LAB — ABO/RH: ABO/RH(D): O POS

## 2020-06-15 SURGERY — FRAMELESS BIOPSY WITH BRAINLAB
Anesthesia: General

## 2020-06-15 MED ORDER — THROMBIN 5000 UNITS EX SOLR
CUTANEOUS | Status: DC | PRN
  Administered 2020-06-15: 10000 [IU] via TOPICAL

## 2020-06-15 MED ORDER — CEFAZOLIN SODIUM 1 G IJ SOLR
INTRAMUSCULAR | Status: AC
  Filled 2020-06-15: qty 20

## 2020-06-15 MED ORDER — BACITRACIN ZINC 500 UNIT/GM EX OINT
TOPICAL_OINTMENT | CUTANEOUS | Status: AC
  Filled 2020-06-15: qty 28.35

## 2020-06-15 MED ORDER — FLEET ENEMA 7-19 GM/118ML RE ENEM: 1.0000 | ENEMA | Freq: Once | RECTAL | Status: DC | PRN

## 2020-06-15 MED ORDER — NICARDIPINE HCL IN NACL 20-0.86 MG/200ML-% IV SOLN: 3.0000 mg/h | INTRAVENOUS | Status: DC

## 2020-06-15 MED ORDER — PHENYLEPHRINE HCL-NACL 10-0.9 MG/250ML-% IV SOLN
INTRAVENOUS | Status: DC | PRN
  Administered 2020-06-15: 25 ug/min via INTRAVENOUS

## 2020-06-15 MED ORDER — HYDROCODONE-ACETAMINOPHEN 5-325 MG PO TABS: 1.0000 | ORAL_TABLET | ORAL | Status: DC | PRN

## 2020-06-15 MED ORDER — ONDANSETRON HCL 4 MG PO TABS: 4.0000 mg | ORAL_TABLET | ORAL | Status: DC | PRN

## 2020-06-15 MED ORDER — CEFAZOLIN SODIUM-DEXTROSE 1-4 GM/50ML-% IV SOLN
1.0000 g | Freq: Three times a day (TID) | INTRAVENOUS | Status: AC
  Administered 2020-06-15 – 2020-06-16 (×3): 1 g via INTRAVENOUS
  Filled 2020-06-15 (×3): qty 50

## 2020-06-15 MED ORDER — FENTANYL CITRATE (PF) 250 MCG/5ML IJ SOLN
INTRAMUSCULAR | Status: DC | PRN
  Administered 2020-06-15: 100 ug via INTRAVENOUS
  Administered 2020-06-15: 50 ug via INTRAVENOUS

## 2020-06-15 MED ORDER — DEXAMETHASONE SODIUM PHOSPHATE 10 MG/ML IJ SOLN
INTRAMUSCULAR | Status: AC
  Filled 2020-06-15: qty 1

## 2020-06-15 MED ORDER — ACETAMINOPHEN 325 MG PO TABS: 650.0000 mg | ORAL_TABLET | ORAL | Status: DC | PRN

## 2020-06-15 MED ORDER — THROMBIN 5000 UNITS EX SOLR
CUTANEOUS | Status: AC
  Filled 2020-06-15: qty 5000

## 2020-06-15 MED ORDER — DEXAMETHASONE 4 MG PO TABS: 2.0000 mg | ORAL_TABLET | Freq: Three times a day (TID) | ORAL | Status: DC

## 2020-06-15 MED ORDER — LIDOCAINE-EPINEPHRINE 1 %-1:100000 IJ SOLN
INTRAMUSCULAR | Status: DC | PRN
  Administered 2020-06-15: 7 mL

## 2020-06-15 MED ORDER — CHLORHEXIDINE GLUCONATE CLOTH 2 % EX PADS
6.0000 | MEDICATED_PAD | Freq: Every day | CUTANEOUS | Status: DC
  Administered 2020-06-15: 6 via TOPICAL

## 2020-06-15 MED ORDER — ONDANSETRON HCL 4 MG/2ML IJ SOLN: 4.0000 mg | INTRAMUSCULAR | Status: DC | PRN

## 2020-06-15 MED ORDER — DEXAMETHASONE 4 MG PO TABS: 4.0000 mg | ORAL_TABLET | Freq: Three times a day (TID) | ORAL | Status: DC

## 2020-06-15 MED ORDER — ONDANSETRON HCL 4 MG/2ML IJ SOLN
INTRAMUSCULAR | Status: DC | PRN
  Administered 2020-06-15 (×2): 4 mg via INTRAVENOUS

## 2020-06-15 MED ORDER — ENOXAPARIN SODIUM 40 MG/0.4ML ~~LOC~~ SOLN
40.0000 mg | SUBCUTANEOUS | Status: DC
  Administered 2020-06-16: 40 mg via SUBCUTANEOUS
  Filled 2020-06-15: qty 0.4

## 2020-06-15 MED ORDER — FENTANYL CITRATE (PF) 250 MCG/5ML IJ SOLN
INTRAMUSCULAR | Status: AC
  Filled 2020-06-15: qty 5

## 2020-06-15 MED ORDER — DEXAMETHASONE SODIUM PHOSPHATE 10 MG/ML IJ SOLN
INTRAMUSCULAR | Status: DC | PRN
  Administered 2020-06-15: 10 mg via INTRAVENOUS

## 2020-06-15 MED ORDER — ONDANSETRON HCL 4 MG/2ML IJ SOLN
INTRAMUSCULAR | Status: AC
  Filled 2020-06-15: qty 2

## 2020-06-15 MED ORDER — LABETALOL HCL 5 MG/ML IV SOLN
10.0000 mg | INTRAVENOUS | Status: DC | PRN
  Administered 2020-06-15: 10 mg via INTRAVENOUS
  Filled 2020-06-15 (×2): qty 4

## 2020-06-15 MED ORDER — MORPHINE SULFATE (PF) 2 MG/ML IV SOLN: 1.0000 mg | INTRAVENOUS | Status: DC | PRN

## 2020-06-15 MED ORDER — ENALAPRILAT 1.25 MG/ML IV SOLN: 1.2500 mg | Freq: Four times a day (QID) | INTRAVENOUS | Status: DC | PRN

## 2020-06-15 MED ORDER — DEXAMETHASONE 0.5 MG PO TABS: 1.0000 mg | ORAL_TABLET | Freq: Two times a day (BID) | ORAL | Status: DC

## 2020-06-15 MED ORDER — SODIUM CHLORIDE (PF) 0.9 % IJ SOLN
INTRAMUSCULAR | Status: AC
  Filled 2020-06-15: qty 10

## 2020-06-15 MED ORDER — ROCURONIUM BROMIDE 10 MG/ML (PF) SYRINGE
PREFILLED_SYRINGE | INTRAVENOUS | Status: AC
  Filled 2020-06-15: qty 10

## 2020-06-15 MED ORDER — CLONAZEPAM 1 MG PO TABS
2.0000 mg | ORAL_TABLET | Freq: Three times a day (TID) | ORAL | Status: DC
  Administered 2020-06-15 – 2020-06-16 (×4): 2 mg via ORAL
  Filled 2020-06-15 (×4): qty 2

## 2020-06-15 MED ORDER — SUGAMMADEX SODIUM 200 MG/2ML IV SOLN
INTRAVENOUS | Status: DC | PRN
  Administered 2020-06-15 (×2): 200 mg via INTRAVENOUS

## 2020-06-15 MED ORDER — BACITRACIN ZINC 500 UNIT/GM EX OINT
TOPICAL_OINTMENT | CUTANEOUS | Status: DC | PRN
  Administered 2020-06-15: 1 via TOPICAL

## 2020-06-15 MED ORDER — MIDAZOLAM HCL 2 MG/2ML IJ SOLN
INTRAMUSCULAR | Status: AC
  Filled 2020-06-15: qty 2

## 2020-06-15 MED ORDER — ROCURONIUM BROMIDE 10 MG/ML (PF) SYRINGE
PREFILLED_SYRINGE | INTRAVENOUS | Status: DC | PRN
  Administered 2020-06-15: 60 mg via INTRAVENOUS
  Administered 2020-06-15: 40 mg via INTRAVENOUS

## 2020-06-15 MED ORDER — 0.9 % SODIUM CHLORIDE (POUR BTL) OPTIME
TOPICAL | Status: DC | PRN
  Administered 2020-06-15: 3000 mL

## 2020-06-15 MED ORDER — PROMETHAZINE HCL 25 MG PO TABS: 12.5000 mg | ORAL_TABLET | ORAL | Status: DC | PRN

## 2020-06-15 MED ORDER — PROPOFOL 10 MG/ML IV BOLUS
INTRAVENOUS | Status: DC | PRN
  Administered 2020-06-15: 150 mg via INTRAVENOUS
  Administered 2020-06-15: 50 mg via INTRAVENOUS

## 2020-06-15 MED ORDER — PROPOFOL 10 MG/ML IV BOLUS
INTRAVENOUS | Status: AC
  Filled 2020-06-15: qty 20

## 2020-06-15 MED ORDER — CHLORHEXIDINE GLUCONATE CLOTH 2 % EX PADS: 6.0000 | MEDICATED_PAD | Freq: Every day | CUTANEOUS | Status: DC

## 2020-06-15 MED ORDER — LIDOCAINE 2% (20 MG/ML) 5 ML SYRINGE
INTRAMUSCULAR | Status: DC | PRN
  Administered 2020-06-15: 60 mg via INTRAVENOUS

## 2020-06-15 MED ORDER — PANTOPRAZOLE SODIUM 40 MG PO TBEC
40.0000 mg | DELAYED_RELEASE_TABLET | Freq: Every day | ORAL | Status: DC
  Administered 2020-06-15 – 2020-06-16 (×2): 40 mg via ORAL
  Filled 2020-06-15 (×2): qty 1

## 2020-06-15 MED ORDER — DEXAMETHASONE 0.5 MG PO TABS: 1.0000 mg | ORAL_TABLET | Freq: Every day | ORAL | Status: DC

## 2020-06-15 MED ORDER — LIDOCAINE-EPINEPHRINE 1 %-1:100000 IJ SOLN
INTRAMUSCULAR | Status: AC
  Filled 2020-06-15: qty 1

## 2020-06-15 MED ORDER — SODIUM CHLORIDE 0.9 % IV SOLN: INTRAVENOUS | Status: DC | PRN

## 2020-06-15 MED ORDER — ACETAMINOPHEN 650 MG RE SUPP: 650.0000 mg | RECTAL | Status: DC | PRN

## 2020-06-15 MED ORDER — PHENYLEPHRINE 40 MCG/ML (10ML) SYRINGE FOR IV PUSH (FOR BLOOD PRESSURE SUPPORT)
PREFILLED_SYRINGE | INTRAVENOUS | Status: AC
  Filled 2020-06-15: qty 10

## 2020-06-15 MED ORDER — DEXAMETHASONE 4 MG PO TABS: 2.0000 mg | ORAL_TABLET | Freq: Two times a day (BID) | ORAL | Status: DC

## 2020-06-15 MED ORDER — PHENYLEPHRINE 40 MCG/ML (10ML) SYRINGE FOR IV PUSH (FOR BLOOD PRESSURE SUPPORT)
PREFILLED_SYRINGE | INTRAVENOUS | Status: DC | PRN
  Administered 2020-06-15: 120 ug via INTRAVENOUS

## 2020-06-15 MED ORDER — DOCUSATE SODIUM 100 MG PO CAPS
100.0000 mg | ORAL_CAPSULE | Freq: Two times a day (BID) | ORAL | Status: DC
  Administered 2020-06-15 – 2020-06-16 (×3): 100 mg via ORAL
  Filled 2020-06-15 (×3): qty 1

## 2020-06-15 MED ORDER — LIDOCAINE 2% (20 MG/ML) 5 ML SYRINGE
INTRAMUSCULAR | Status: AC
  Filled 2020-06-15: qty 5

## 2020-06-15 MED ORDER — DEXAMETHASONE 4 MG PO TABS
4.0000 mg | ORAL_TABLET | Freq: Four times a day (QID) | ORAL | Status: DC
  Administered 2020-06-15 – 2020-06-16 (×5): 4 mg via ORAL
  Filled 2020-06-15 (×5): qty 1

## 2020-06-15 MED ORDER — POLYETHYLENE GLYCOL 3350 17 G PO PACK
17.0000 g | PACK | Freq: Every day | ORAL | Status: DC | PRN
  Filled 2020-06-15: qty 1

## 2020-06-15 MED ORDER — HEMOSTATIC AGENTS (NO CHARGE) OPTIME
TOPICAL | Status: DC | PRN
  Administered 2020-06-15: 1 via TOPICAL

## 2020-06-15 SURGICAL SUPPLY — 74 items
BAND RUBBER #18 3X1/16 STRL (MISCELLANEOUS) ×8 IMPLANT
BENZOIN TINCTURE PRP APPL 2/3 (GAUZE/BANDAGES/DRESSINGS) IMPLANT
BIT DRILL LONG 3.0X30 (BIT) ×3 IMPLANT
BIT DRILL LONG 3.0X30MM (BIT) ×1
BLADE CLIPPER SURG (BLADE) ×4 IMPLANT
BNDG GAUZE ELAST 4 BULKY (GAUZE/BANDAGES/DRESSINGS) IMPLANT
BNDG STRETCH 4X75 STRL LF (GAUZE/BANDAGES/DRESSINGS) IMPLANT
BUR ACORN 9.0 PRECISION (BURR) IMPLANT
BUR ACORN 9.0MM PRECISION (BURR)
BUR ROUND FLUTED 4 SOFT TCH (BURR) IMPLANT
BUR ROUND FLUTED 4MM SOFT TCH (BURR)
CANISTER SUCT 3000ML PPV (MISCELLANEOUS) ×8 IMPLANT
CNTNR URN SCR LID CUP LEK RST (MISCELLANEOUS) ×2 IMPLANT
CONT SPEC 4OZ STRL OR WHT (MISCELLANEOUS) ×2
COVER MAYO STAND STRL (DRAPES) IMPLANT
COVER WAND RF STERILE (DRAPES) ×4 IMPLANT
DECANTER SPIKE VIAL GLASS SM (MISCELLANEOUS) ×4 IMPLANT
DRAPE HALF SHEET 40X57 (DRAPES) ×4 IMPLANT
DRAPE ORTHO SPLIT 77X108 STRL (DRAPES) ×2
DRAPE SHEET LG 3/4 BI-LAMINATE (DRAPES) ×4 IMPLANT
DRAPE STERI IOBAN 125X83 (DRAPES) IMPLANT
DRAPE SURG 17X23 STRL (DRAPES) IMPLANT
DRAPE SURG ORHT 6 SPLT 77X108 (DRAPES) ×2 IMPLANT
DRAPE WARM FLUID 44X44 (DRAPES) ×4 IMPLANT
DRSG ADAPTIC 3X8 NADH LF (GAUZE/BANDAGES/DRESSINGS) IMPLANT
DRSG TELFA 3X8 NADH (GAUZE/BANDAGES/DRESSINGS) ×4 IMPLANT
DURAPREP 6ML APPLICATOR 50/CS (WOUND CARE) ×4 IMPLANT
ELECT COATED BLADE 2.86 ST (ELECTRODE) ×4 IMPLANT
ELECT REM PT RETURN 9FT ADLT (ELECTROSURGICAL) ×4
ELECTRODE REM PT RTRN 9FT ADLT (ELECTROSURGICAL) ×2 IMPLANT
GAUZE 4X4 16PLY RFD (DISPOSABLE) IMPLANT
GAUZE SPONGE 4X4 12PLY STRL (GAUZE/BANDAGES/DRESSINGS) IMPLANT
GAUZE XEROFORM 1X8 LF (GAUZE/BANDAGES/DRESSINGS) ×4 IMPLANT
GLOVE BIO SURGEON STRL SZ7.5 (GLOVE) ×8 IMPLANT
GLOVE BIOGEL PI IND STRL 6.5 (GLOVE) ×2 IMPLANT
GLOVE BIOGEL PI IND STRL 7.5 (GLOVE) ×2 IMPLANT
GLOVE BIOGEL PI INDICATOR 6.5 (GLOVE) ×2
GLOVE BIOGEL PI INDICATOR 7.5 (GLOVE) ×2
GLOVE ECLIPSE 7.5 STRL STRAW (GLOVE) ×4 IMPLANT
GLOVE EXAM NITRILE LRG STRL (GLOVE) IMPLANT
GLOVE EXAM NITRILE XS STR PU (GLOVE) IMPLANT
GOWN STRL REUS W/ TWL LRG LVL3 (GOWN DISPOSABLE) ×4 IMPLANT
GOWN STRL REUS W/ TWL XL LVL3 (GOWN DISPOSABLE) IMPLANT
GOWN STRL REUS W/TWL 2XL LVL3 (GOWN DISPOSABLE) IMPLANT
GOWN STRL REUS W/TWL LRG LVL3 (GOWN DISPOSABLE) ×4
GOWN STRL REUS W/TWL XL LVL3 (GOWN DISPOSABLE)
HEMOSTAT SURGICEL 2X14 (HEMOSTASIS) IMPLANT
KIT BASIN OR (CUSTOM PROCEDURE TRAY) ×4 IMPLANT
KIT NEEDLE BIOPSY 1.8X235 CRAN (NEEDLE) ×8 IMPLANT
KIT TURNOVER KIT B (KITS) ×4 IMPLANT
KIT VARIOGUIDE DRILL 1.9 (KITS) ×8 IMPLANT
MARKER SKIN SURG 5.25 VIO NS (MISCELLANEOUS) ×4 IMPLANT
MARKER SPHERE PSV REFLC 13MM (MARKER) ×8 IMPLANT
NEEDLE HYPO 22GX1.5 SAFETY (NEEDLE) ×4 IMPLANT
NEEDLE SPNL 18GX3.5 QUINCKE PK (NEEDLE) ×4 IMPLANT
NS IRRIG 1000ML POUR BTL (IV SOLUTION) ×12 IMPLANT
PACK CRANIOTOMY CUSTOM (CUSTOM PROCEDURE TRAY) ×4 IMPLANT
PIN MAYFIELD SKULL DISP (PIN) ×4 IMPLANT
SPONGE SURGIFOAM ABS GEL SZ50 (HEMOSTASIS) ×4 IMPLANT
STAPLER VISISTAT 35W (STAPLE) ×4 IMPLANT
SUT ETHILON 3 0 FSL (SUTURE) IMPLANT
SUT ETHILON 3 0 PS 1 (SUTURE) IMPLANT
SUT MON AB 3-0 SH 27 (SUTURE)
SUT MON AB 3-0 SH27 (SUTURE) IMPLANT
SUT NURALON 4 0 TR CR/8 (SUTURE) IMPLANT
SUT SILK 0 TIES 10X30 (SUTURE) IMPLANT
SUT VIC AB 2-0 CP2 18 (SUTURE) IMPLANT
SUT VICRYL RAPIDE 3/0 (SUTURE) ×4 IMPLANT
SYR 3ML LL SCALE MARK (SYRINGE) ×12 IMPLANT
TOWEL GREEN STERILE (TOWEL DISPOSABLE) ×4 IMPLANT
TOWEL GREEN STERILE FF (TOWEL DISPOSABLE) ×4 IMPLANT
TRAY FOLEY MTR SLVR 16FR STAT (SET/KITS/TRAYS/PACK) ×4 IMPLANT
UNDERPAD 30X36 HEAVY ABSORB (UNDERPADS AND DIAPERS) ×4 IMPLANT
WATER STERILE IRR 1000ML POUR (IV SOLUTION) ×4 IMPLANT

## 2020-06-15 NOTE — Anesthesia Postprocedure Evaluation (Signed)
Anesthesia Post Note  Patient: Normal Recinos  Procedure(s) Performed: Left temporal lobe stereotactic brain biopsy with brainlab (Left ) APPLICATION OF CRANIAL NAVIGATION (N/A )     Patient location during evaluation: PACU Anesthesia Type: General Level of consciousness: awake and alert Pain management: pain level controlled Vital Signs Assessment: post-procedure vital signs reviewed and stable Respiratory status: spontaneous breathing, nonlabored ventilation, respiratory function stable and patient connected to nasal cannula oxygen Cardiovascular status: blood pressure returned to baseline and stable Postop Assessment: no apparent nausea or vomiting Anesthetic complications: no   No complications documented.  Last Vitals:  Vitals:   06/15/20 1530 06/15/20 1600  BP: 129/80 116/71  Pulse: 94 96  Resp: 16 13  Temp:    SpO2: 98% 99%    Last Pain:  Vitals:   06/15/20 0945  TempSrc:   PainSc: 0-No pain                 Hasana Alcorta P Gaurav Baldree

## 2020-06-15 NOTE — Op Note (Signed)
Procedure(s): Left temporal lobe stereotactic brain biopsy with brainlab APPLICATION OF CRANIAL NAVIGATION Procedure Note  Hunter Terry male 59 y.o. 06/15/2020  Procedure(s) and Anesthesia Type:    * Left temporal lobe stereotactic brain biopsy with brainlab - General    * APPLICATION OF CRANIAL NAVIGATION - General  Surgeon(s) and Role:    [name], Dorcas Carrow, MD - Primary   Indications: This is a 59 year old man who was admitted to the hospital for status epilepticus.  He was found to have a infiltrative left temporal lobe lesion which was present in imaging in May, though slightly larger.  His seizures were eventually controlled with 3 antiepileptic agents.  His case was discussed in interdisciplinary tumor board and recommendation was to proceed with biopsy of the lesion.  Risks, benefits, alternatives, and expected convalescence were discussed with the patient and his family.  Risks discussed included, but were not limited to, bleeding, pain, infection, seizure, stroke, scar, nondiagnostic tissue, neurologic deficit, coma, and death.  They wished to proceed with surgery and informed consent was obtained.   Surgeon: [name]   Assistants: None  Anesthesia: General endotracheal anesthesia  Procedure Detail   Patient was brought to the operating room.  General anesthesia was induced and patient was intubated by the anesthesia service.  After appropriate lines and monitors were placed, patient was in supine with the head turned to the right with eyes protected and all pressure points padded.  His head was placed in [place] head holder and affixed to the bed.  Using a preoperative thin cut CT scan, a 3D model of the scalp was created.  This was used for surface registration with the [place] system.  A trajectory was planned through the middle temporal gyrus to the lesion which was fairly superficial in the temporal lobe.  The planned entry point was marked on the skin.  A  small area of the scalp was clipped, preprepped with alcohol and prepped and draped in sterile fashion.  1% lidocaine with epinephrine injected into the planned incision.  A timeout was performed.  Preoperative antibiotics and dexamethasone were given.  The BrainLab Varioguide was locked into place along with proper trajectory.  A small stab incision was made and a hand-held high-speed drill was used to drill the skull along the appropriate trajectory.  The dura was then punctured and a navigated biopsy needle was then passed to the target under navigation.  Numerous specimens were obtained.  The tissue was slightly more diaphanous and more liquidy than typical white matter.  No significant bleeding was encountered.  The biopsy needle was then withdrawn.  The small stab incision was irrigated and then closed with 3-0 Vicryl stitches followed by bacitracin.  Patient was removed from [place] head holder and extubated by anesthesia service.  He was following commands at the end of surgery.  All counts were correct at the end of surgery.  No complications were noted.  Left temporal lobe stereotactic brain biopsy with brainlab, APPLICATION OF CRANIAL NAVIGATION  Findings: Successful biopsy  Estimated Blood Loss:  Minimal         Drains: none         Total IV Fluids: see anesthesia records  Blood Given: none          Specimens: Left temporal lobe lesion         Implants: none        Complications:  * No complications entered in OR log *  Disposition: PACU - hemodynamically stable.         Condition: stable

## 2020-06-15 NOTE — Transfer of Care (Signed)
Immediate Anesthesia Transfer of Care Note  Patient: Hunter Terry  Procedure(s) Performed: Left temporal lobe stereotactic brain biopsy with brainlab (Left ) APPLICATION OF CRANIAL NAVIGATION (N/A )  Patient Location: PACU  Anesthesia Type:General  Level of Consciousness: awake, alert  and oriented  Airway & Oxygen Therapy: Patient Spontanous Breathing and Patient connected to face mask oxygen  Post-op Assessment: Report given to RN and Post -op Vital signs reviewed and stable  Post vital signs: Reviewed and stable  Last Vitals:  Vitals Value Taken Time  BP 131/81 06/15/20 1009  Temp 36.4 C 06/15/20 0939  Pulse 91 06/15/20 1011  Resp 16 06/15/20 1011  SpO2 97 % 06/15/20 1011  Vitals shown include unvalidated device data.  Last Pain:  Vitals:   06/15/20 0945  TempSrc:   PainSc: 0-No pain         Complications: No complications documented.

## 2020-06-15 NOTE — Anesthesia Procedure Notes (Signed)
Procedure Name: Intubation Date/Time: 06/15/2020 8:07 AM Performed by: Alain Marion, CRNA Pre-anesthesia Checklist: Patient identified, Emergency Drugs available, Suction available and Patient being monitored Patient Re-evaluated:Patient Re-evaluated prior to induction Oxygen Delivery Method: Circle System Utilized Preoxygenation: Pre-oxygenation with 100% oxygen Induction Type: IV induction Ventilation: Mask ventilation without difficulty Laryngoscope Size: Miller and 2 Grade View: Grade I Tube type: Oral Tube size: 7.0 mm Number of attempts: 1 Airway Equipment and Method: Stylet and Oral airway Placement Confirmation: ETT inserted through vocal cords under direct vision,  positive ETCO2 and breath sounds checked- equal and bilateral Secured at: 22 cm Tube secured with: Tape Dental Injury: Teeth and Oropharynx as per pre-operative assessment

## 2020-06-15 NOTE — Progress Notes (Signed)
PROGRESS NOTE  Hunter Terry  DOB: 11/02/1960  PCP: Patient, No Pcp Per [id number]  DOA: 06/09/2020  LOS: 5 days   Chief Complaint  Patient presents with  . Altered Mental Status   Brief narrative: [facility]  is a 59 y.o. 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  He did not seek any further medical evaluation after the diagnosis was made. Patient was brought to the ED on 10/29 for altered mental status.   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   Patient also had a low sodium level 122. Admitted for further evaluation management.  Subjective: Patient was seen and examined this afternoon in neuro ICU.Marland Kitchen Earlier today, he underwent a stereotactic needle biopsy of brain mass by neurosurgery. Wife at bedside.  Interpreter service used.  Patient and family is expecting a discharge tomorrow.  Assessment/Plan: Brain mass -MRI 10/30 > Slight worsening of infiltrative tumor of the left temporal lobe, with slight worsening of mass effect on the left lateral ventricle and brainstem. -CT chest/abd pelvis reveals no other concerning masses -Neurosurgery consultation appreciated. -11/4, he underwent a stereotactic needle biopsy of brain mass by neurosurgery. -Plan is to monitor overnight in ICU.  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 -Nephrology consultation appreciated. -Was treated with demeclocycline and oral Lasix. -Sodium level subsequently improved and remained stable. 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 -Patient underwent long-term  EEG monitoring as well. -Currently on Vimpat, Keppra and phenytoin. -seizures resolved -Decadron taper started on 11/1 by Neurology  Acute metabolic encephalopathy -During this hospitalization, patient was intermittently agitated, trying to get out of bed, he required restraints.  -Mental status has improved now but patient is only oriented to place at this time.  Mobility: Encourage ambulation Code Status:   Code Status: Full Code  Nutritional status: Body mass index is 26.6 kg/m.     Diet Order    None      DVT prophylaxis: enoxaparin (LOVENOX) injection 40 mg Start: 06/16/20 1200 SCDs Start: 06/15/20 1030   Antimicrobials:  None Fluid: None Consultants: Neurology, neurosurgery Family Communication:  Wife at bedside  Status is: Inpatient  Remains inpatient appropriate because:Inpatient level of care appropriate due to severity of illness   Dispo: The patient is from: Home              Anticipated d/c is to: Home              Anticipated d/c date is: 2 to 3 days              Patient currently is not medically stable to d/c.       Infusions:  .  ceFAZolin (ANCEF) IV    . niCARDipine      Scheduled Meds: . Chlorhexidine Gluconate Cloth  6 each Topical Daily  . dexamethasone  4 mg Oral Q6H   Followed by  . [START ON 06/17/2020] dexamethasone  4 mg Oral Q8H   Followed by  . [START ON 06/19/2020] dexamethasone  2 mg Oral Q8H   Followed by  . [START ON 06/21/2020] dexamethasone  2 mg Oral  Q12H   Followed by  . [START ON 06/23/2020] dexamethasone  1 mg Oral Q12H   Followed by  . [START ON 06/25/2020] dexamethasone  1 mg Oral Daily  . docusate sodium  100 mg Oral BID  . [START ON 06/16/2020] enoxaparin (LOVENOX) injection  40 mg Subcutaneous Q24H  . lacosamide  200 mg Oral BID  . levETIRAcetam  1,500 mg Oral BID  . pantoprazole  40 mg Oral Daily  . phenytoin  100 mg Oral TID    Antimicrobials: Anti-infectives (From admission, onward)   Start      Dose/Rate Route Frequency Ordered Stop   06/15/20 1600  ceFAZolin (ANCEF) IVPB 1 g/50 mL premix        1 g 100 mL/hr over 30 Minutes Intravenous Every 8 hours 06/15/20 1029 06/16/20 1559   06/14/20 1343  ceFAZolin (ANCEF) IVPB 2g/100 mL premix        2 g 200 mL/hr over 30 Minutes Intravenous 30 min pre-op 06/14/20 1343 06/15/20 0815   06/11/20 0000  demeclocycline (DECLOMYCIN) tablet 300 mg  Status:  Discontinued        300 mg Per NG tube Every 6 hours 06/10/20 1952 06/10/20 2004   06/11/20 0000  demeclocycline (DECLOMYCIN) tablet 300 mg  Status:  Discontinued        300 mg Oral Every 6 hours 06/10/20 2004 06/12/20 1003   06/10/20 1200  demeclocycline (DECLOMYCIN) tablet 300 mg  Status:  Discontinued        300 mg Oral Every 6 hours 06/10/20 1018 06/10/20 1952      PRN meds: acetaminophen **OR** acetaminophen, enalaprilat, HYDROcodone-acetaminophen, labetalol, LORazepam, morphine injection, ondansetron **OR** ondansetron (ZOFRAN) IV, polyethylene glycol, promethazine, sodium phosphate   Objective: Vitals:   06/15/20 1300 06/15/20 1330  BP: 104/69 (!) 113/57  Pulse: 84 84  Resp: 13 15  Temp:    SpO2: 94% 92%    Intake/Output Summary (Last 24 hours) at 06/15/2020 1515 Last data filed at 06/15/2020 8563 Gross per 24 hour  Intake 1000 ml  Output 10 ml  Net 990 ml   Filed Weights   06/09/20 2013 06/10/20 1300  Weight: 72.6 kg 72.5 kg   Weight change:  Body mass index is 26.6 kg/m.   Physical Exam: General exam: Appears calm and comfortable.  Not in physical distress Skin: No rashes, lesions or ulcers. HEENT: Atraumatic, normocephalic, supple neck, no obvious bleeding Lungs: Clear to auscultation bilaterally CVS: Regular rate and rhythm, no murmur GI/Abd soft, nontender, nondistended, bowel sound present CNS: Alert, awake, oriented to place only.  Not restless or agitated. Psychiatry: Mood appropriate Extremities: No pedal edema, no calf tenderness  Data Review: I have  personally reviewed the laboratory data and studies available.  Recent Labs  Lab 06/09/20 2055 06/09/20 2111 06/10/20 0309  WBC 9.1  --  9.6  NEUTROABS 7.7  --  7.5  HGB 14.9 14.6 13.1  HCT 43.6 43.0 38.2*  MCV 82.1  --  83.4  PLT 307  --  283   Recent Labs  Lab 06/10/20 0727 06/10/20 0727 06/10/20 1337 06/10/20 2317 06/11/20 0842 06/11/20 1144 06/12/20 0144 06/12/20 1349 06/13/20 0430 06/14/20 0252 06/15/20 0140  NA 122*   < > 125*   < >   < >  --  134* 136 135 136 135  K 3.5  --  4.0  --   --   --   --   --  4.4 4.2 4.4  CL 88*  --  90*  --   --   --   --   --  98 99 100  CO2 25  --  27  --   --   --   --   --  28 27 25   GLUCOSE 122*  --  118*  --   --   --   --   --  116* 129* 134*  BUN 8  --  8  --   --   --   --   --  14 13 13   CREATININE 0.70  --  0.71  --   --   --   --   --  1.01 0.79 0.88  CALCIUM 8.4*  --  8.6*  --   --   --   --   --  9.1 9.1 9.2  MG  --   --   --   --   --  2.0  --   --   --   --   --   PHOS  --   --   --   --   --   --   --   --  4.6 4.2 4.3   < > = values in this interval not displayed.   F/u labs ordered.  Signed, Terrilee Croak, MD [facility] 06/15/2020

## 2020-06-15 NOTE — Care Plan (Signed)
Unable to see patient as he was in OR.  Recommendations - Continue Keppra 1500 mg twice daily, lacosamide 200 mg twice daily, Dilantin 100 mg every 8 hours and Klonopin 2 mg 3 times daily. -Continue dexamethasone taperasscheduled - Follow up with Dr Delice Lesch in 8-12 weeks - seizure precautions including do not drive fr  Months  Neurology will sign off. Please call for any further questions.   Delron Comer Barbra Sarks

## 2020-06-15 NOTE — Anesthesia Procedure Notes (Signed)
Arterial Line Insertion Start/End11/11/2019 7:15 AM Performed by: Alain Marion, CRNA, CRNA  Preanesthetic checklist: patient identified, IV checked, site marked, risks and benefits discussed, surgical consent, monitors and equipment checked, pre-op evaluation, timeout performed and anesthesia consent Lidocaine 1% used for infiltration Left, radial was placed Catheter size: 20 G Hand hygiene performed  and maximum sterile barriers used   Attempts: 1 Procedure performed without using ultrasound guided technique. Following insertion, dressing applied and Biopatch. Post procedure assessment: normal and unchanged  Patient tolerated the procedure well with no immediate complications.

## 2020-06-16 ENCOUNTER — Encounter (HOSPITAL_COMMUNITY): Payer: Self-pay | Admitting: Neurosurgery

## 2020-06-16 ENCOUNTER — Other Ambulatory Visit (HOSPITAL_COMMUNITY): Payer: Self-pay | Admitting: Internal Medicine

## 2020-06-16 ENCOUNTER — Inpatient Hospital Stay (HOSPITAL_COMMUNITY): Payer: Self-pay

## 2020-06-16 LAB — CBC WITH DIFFERENTIAL/PLATELET
Abs Immature Granulocytes: 0.04 10*3/uL (ref 0.00–0.07)
Basophils Absolute: 0 10*3/uL (ref 0.0–0.1)
Basophils Relative: 0 %
Eosinophils Absolute: 0.1 10*3/uL (ref 0.0–0.5)
Eosinophils Relative: 1 %
HCT: 42.5 % (ref 39.0–52.0)
Hemoglobin: 14.3 g/dL (ref 13.0–17.0)
Immature Granulocytes: 1 %
Lymphocytes Relative: 16 %
Lymphs Abs: 1.2 10*3/uL (ref 0.7–4.0)
MCH: 28.2 pg (ref 26.0–34.0)
MCHC: 33.6 g/dL (ref 30.0–36.0)
MCV: 83.8 fL (ref 80.0–100.0)
Monocytes Absolute: 0.7 10*3/uL (ref 0.1–1.0)
Monocytes Relative: 9 %
Neutro Abs: 5.7 10*3/uL (ref 1.7–7.7)
Neutrophils Relative %: 73 %
Platelets: 318 10*3/uL (ref 150–400)
RBC: 5.07 MIL/uL (ref 4.22–5.81)
RDW: 13.2 % (ref 11.5–15.5)
WBC: 7.7 10*3/uL (ref 4.0–10.5)
nRBC: 0 % (ref 0.0–0.2)

## 2020-06-16 LAB — BASIC METABOLIC PANEL
Anion gap: 10 (ref 5–15)
BUN: 12 mg/dL (ref 6–20)
CO2: 25 mmol/L (ref 22–32)
Calcium: 8.9 mg/dL (ref 8.9–10.3)
Chloride: 101 mmol/L (ref 98–111)
Creatinine, Ser: 0.85 mg/dL (ref 0.61–1.24)
GFR, Estimated: >60 mL/min (ref >60)
Glucose, Bld: 131 mg/dL — ABNORMAL HIGH (ref 70–99)
Potassium: 4.2 mmol/L (ref 3.5–5.1)
Sodium: 136 mmol/L (ref 135–145)

## 2020-06-16 IMAGING — CT CT HEAD W/O CM
4 series · 17 of 47 positions shown, 19 images · non-contrast
Comparison: Brain MRI [DATE].

CLINICAL DATA: Brain mass or lesion; status post biopsy.

EXAM:
CT HEAD WITHOUT CONTRAST
TECHNIQUE: Contiguous axial images were obtained from the base of the skull
through the vertex without intravenous contrast.

[Series 3: head wo · axial · 0.39mm/px · z∈[-150,-30]mm · 7 of 33 slices shown, 9 images]
[im 5/33  brain]
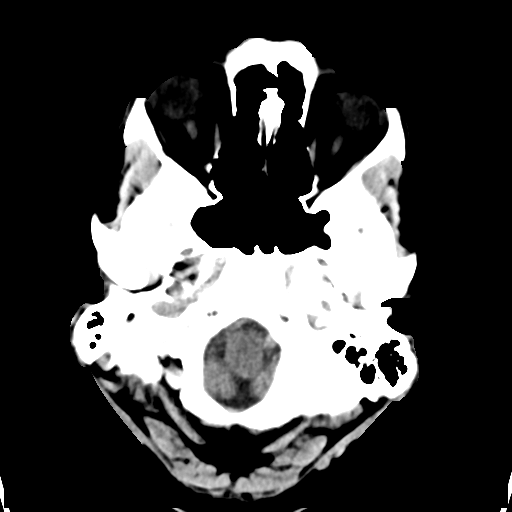
[im 5/33  bone]
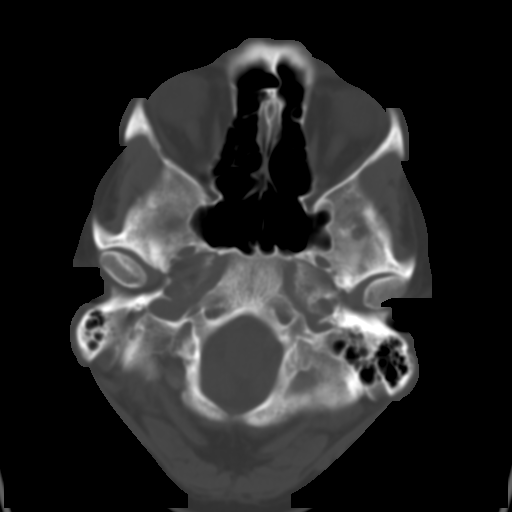
[im 9/33  brain]
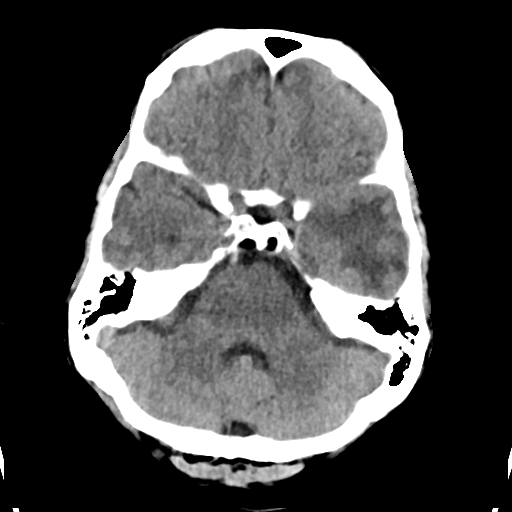
[im 13/33  brain]
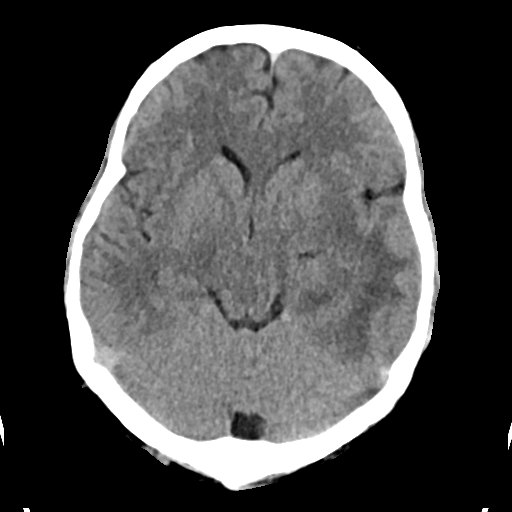
[im 17/33  brain]
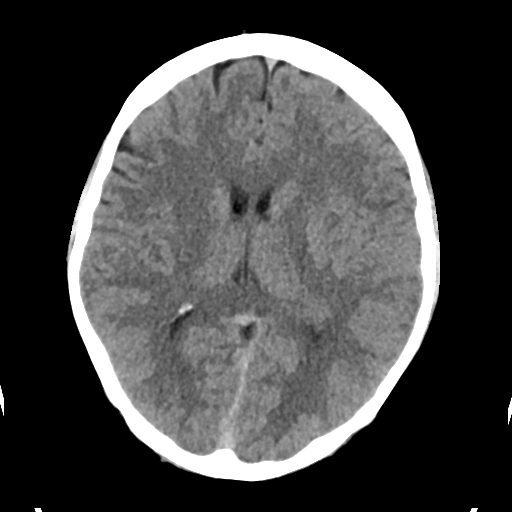
[im 21/33  brain]
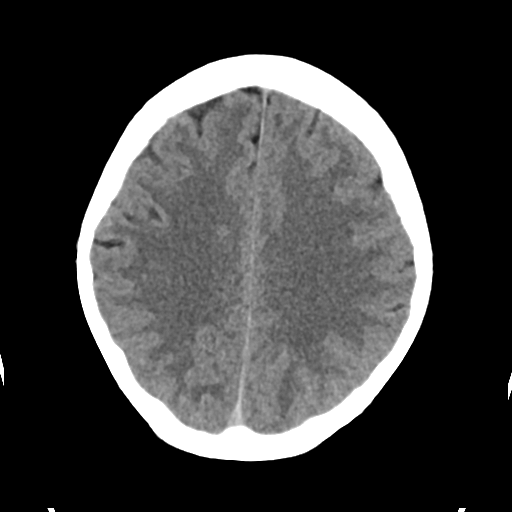
[im 21/33  bone]
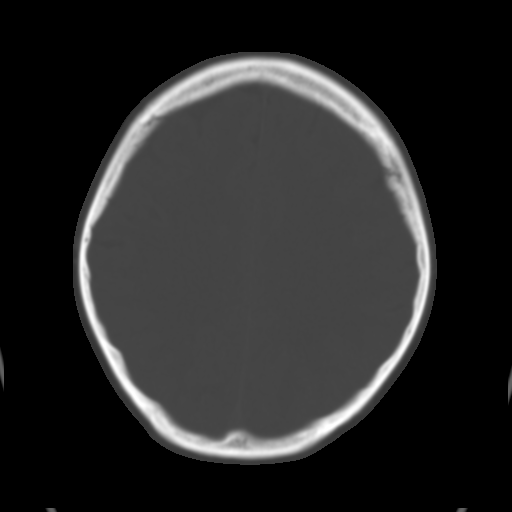
[im 25/33  brain]
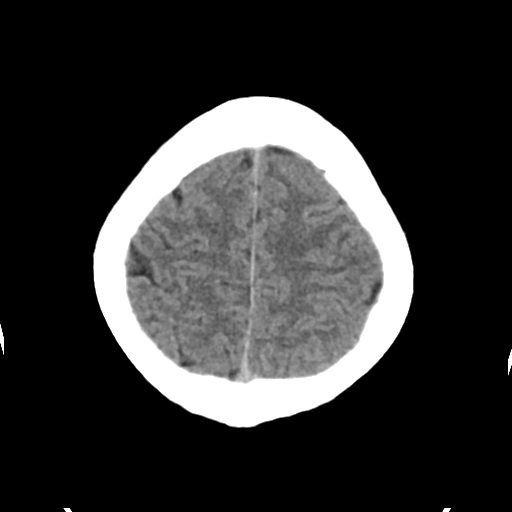
[im 29/33  brain]
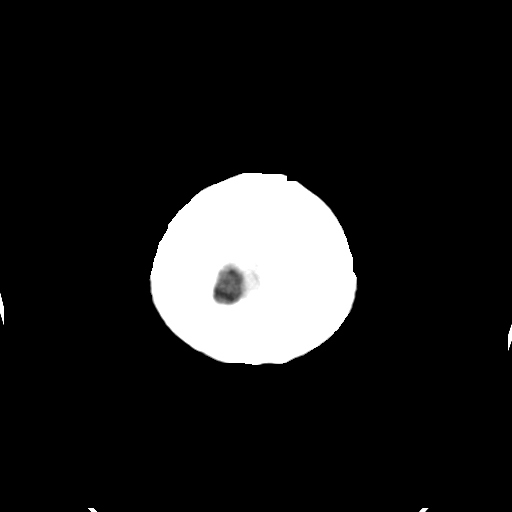

[Series 4: head bone · axial · 0.39mm/px · z∈[-154,-98]mm · 4 of 81 slices shown]
[im 9/81  bone]
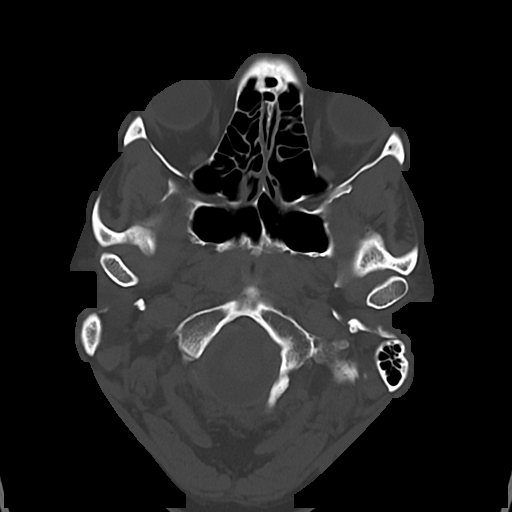
[im 17/81  bone]
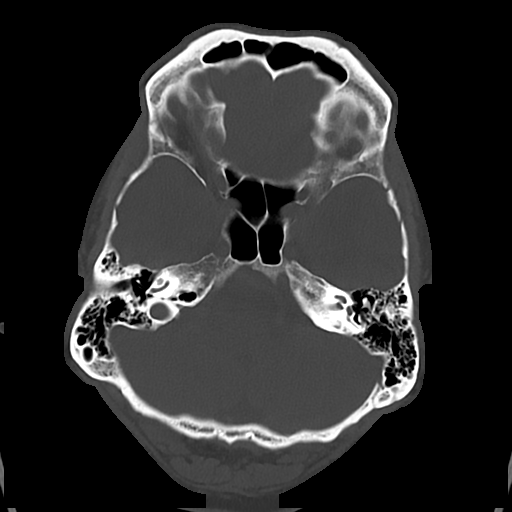
[im 25/81  bone]
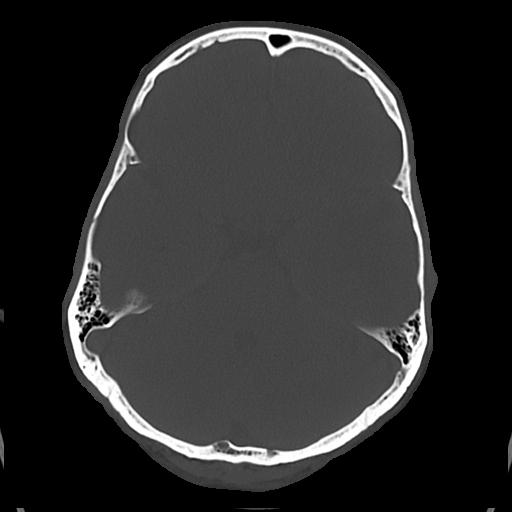
[im 37/81  bone]
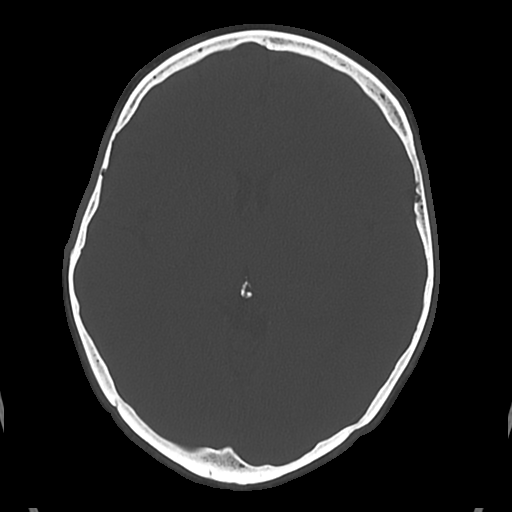

[Series 5: cor soft · coronal · 0.33mm/px · 3 of 64 slices shown]
[im 22/64  brain]
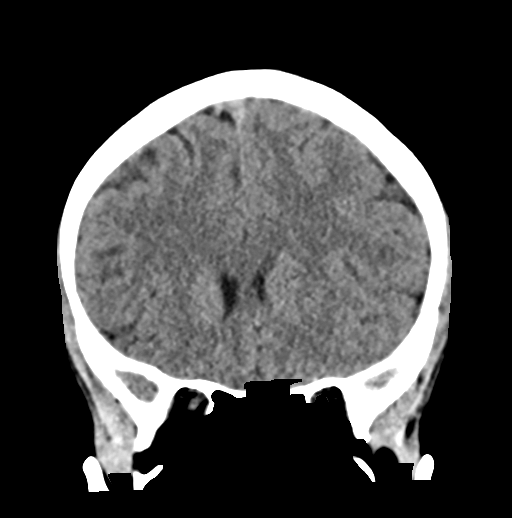
[im 29/64  brain]
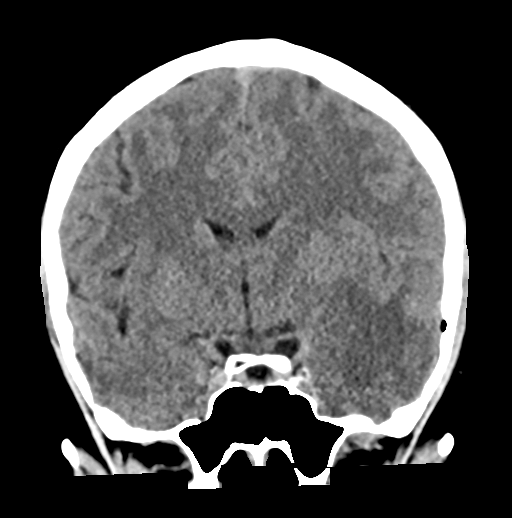
[im 36/64  brain]
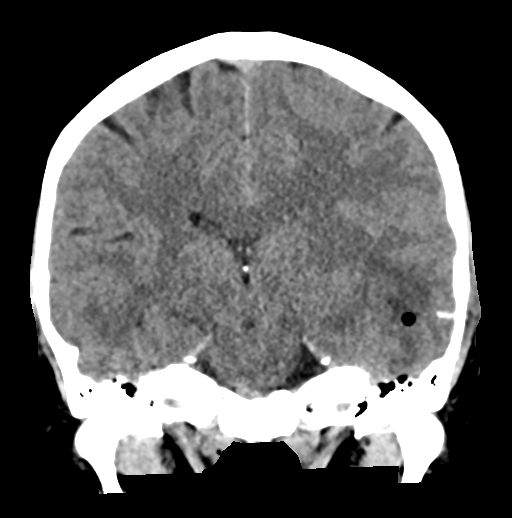

[Series 6: sag soft · sagittal · 0.33mm/px · 3 of 55 slices shown]
[im 19/55  brain]
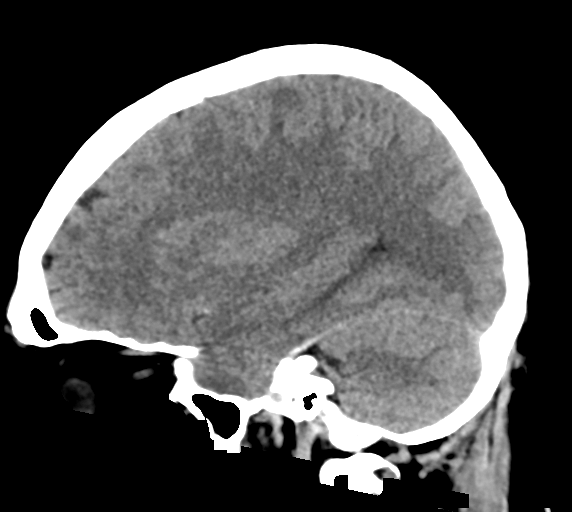
[im 28/55  brain]
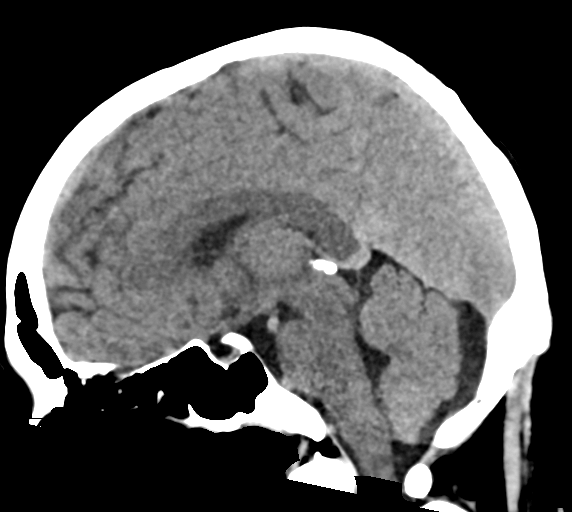
[im 37/55  brain]
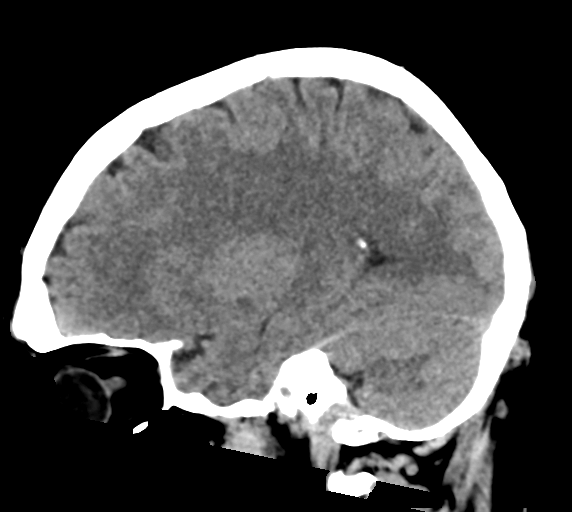

[17 of 47 positions shown; findings below may reference images not displayed]

FINDINGS: Brain:

A small biopsy tract is now present within the lateral aspect of the
left temporal lobe, at site of a large infiltrative left temporal
lobe mass. There is a small amount of expected postprocedural blood
products at the biopsy site and trace adjacent subarachnoid
hemorrhage. Additionally, there is trace overlying extra-axial
pneumocephalus.

Vascular: No hyperdense vessel.

Skull: Normal. Negative for fracture or focal lesion.

Sinuses/Orbits: Visualized orbits show no acute finding. No
significant paranasal sinus disease at the imaged levels.
IMPRESSION: Postprocedural changes from interval biopsy of a large infiltrative
left temporal lobe mass. This includes a small amount of expected
blood products at the biopsy site and trace adjacent subarachnoid
hemorrhage.

## 2020-06-16 MED ORDER — PHENYTOIN SODIUM EXTENDED 100 MG PO CAPS: 100.0000 mg | ORAL_CAPSULE | Freq: Three times a day (TID) | ORAL | 11 refills | Status: DC

## 2020-06-16 MED ORDER — PHENYTOIN 50 MG PO CHEW: 100.0000 mg | CHEWABLE_TABLET | Freq: Three times a day (TID) | ORAL | 0 refills | Status: DC

## 2020-06-16 MED ORDER — CLONAZEPAM 2 MG PO TABS: 2.0000 mg | ORAL_TABLET | Freq: Three times a day (TID) | ORAL | 0 refills | Status: DC

## 2020-06-16 MED ORDER — LACOSAMIDE 200 MG PO TABS: 200.0000 mg | ORAL_TABLET | Freq: Two times a day (BID) | ORAL | 0 refills | Status: DC

## 2020-06-16 MED ORDER — DEXAMETHASONE 1 MG PO TABS: ORAL_TABLET | ORAL | 0 refills | Status: DC

## 2020-06-16 MED ORDER — HYDROCODONE-ACETAMINOPHEN 5-325 MG PO TABS: 1.0000 | ORAL_TABLET | Freq: Four times a day (QID) | ORAL | 0 refills | Status: DC | PRN

## 2020-06-16 MED ORDER — LEVETIRACETAM 750 MG PO TABS: 1500.0000 mg | ORAL_TABLET | Freq: Two times a day (BID) | ORAL | 0 refills | Status: DC

## 2020-06-16 MED FILL — VIMPAT 200 MG TABLET: 200 | 30 days supply | Qty: 60 | Fill #0

## 2020-06-16 MED FILL — clonazePAM 2 MG TABS: 2 | 30 days supply | Qty: 90 | Fill #0

## 2020-06-16 MED FILL — DEXAMETHASONE 2 MG TABLET: 2 | 10 days supply | Qty: 25 | Fill #0

## 2020-06-16 MED FILL — HYDROCODON-APAP 5-325: 5-325 | 5 days supply | Qty: 20 | Fill #0

## 2020-06-16 MED FILL — levETIRAcetam 750 MG TABS: 750 | 30 days supply | Qty: 120 | Fill #0

## 2020-06-16 MED FILL — PHENYTOIN SODIUM EXTENDED 1: 100 | 30 days supply | Qty: 90 | Fill #0

## 2020-06-16 NOTE — Evaluation (Signed)
Physical Therapy Evaluation Patient Details Name: Hunter Terry MRN: [id number] DOB: 08-24-1960 Today's Date: 06/16/2020   History of Present Illness  Pt is a 59 y/o male admitted secondary to AMS and seizure. Found to have worsening L temporal lobe mass and is s/p biopsy. Pt with hx of temporal lobe mass, however, no other pertinent PMH.   Clinical Impression  Pt admitted secondary to problem above with deficits below. Pt presenting with cognitive deficits and decreased balance. Noted LOB with dynamic gait tasks and pt had difficulty dual tasking. Also question visual deficits as pt having difficult time during stair management on where to place foot. Required min to mod A +2 for stability and safety on steps. Required min A for gait without AD. Pt with improved balance with use of walker, so recommend for home. Feel pt would also benefit from Outpatient PT if eligible. Educated pt's wife on how to appropriately assist pt at d/c. Will continue to follow acutely.     Follow Up Recommendations Outpatient PT;Supervision/Assistance - 24 hour    Equipment Recommendations  Rolling walker with 5" wheels    Recommendations for Other Services       Precautions / Restrictions Precautions Precautions: Fall Restrictions Weight Bearing Restrictions: No      Mobility  Bed Mobility               General bed mobility comments: IN chair upon entry.     Transfers Overall transfer level: Needs assistance Equipment used: None Transfers: Sit to/from Stand Sit to Stand: Min guard         General transfer comment: Min guard for safety.   Ambulation/Gait Ambulation/Gait assistance: Min guard;Min assist;+2 safety/equipment Gait Distance (Feet): 175 Feet Assistive device: None;4-wheeled walker Gait Pattern/deviations: Step-through pattern;Decreased stride length;Drifts right/left Gait velocity: Decreased   General Gait Details: Unsteadiness noted and pt with wide BOS. LOB noted X2-3  during gait. Pt with unsteadiness when performing horizontal and vertical head turns. Difficulty dual tasking as pt stopping completely when looking up. Improved balance noted with use of walker. Educated about using walker at home.   Stairs Stairs: Yes Stairs assistance: Min assist;+2 physical assistance;Mod assist;+2 safety/equipment Stair Management: Step to pattern;Forwards (2 person HHA) Number of Stairs: 6 General stair comments: Pt very unsteady initially and missing foot placement on steps. Question possible vision deficits. Pt with LOB upon perform 1 and 2nd steps requiring mod A. Min A +2 with bilateral HHA for remainder. Educated pt's wife about how she could assist and gave gait belt.   Wheelchair Mobility    Modified Rankin (Stroke Patients Only)       Balance Overall balance assessment: Needs assistance Sitting-balance support: No upper extremity supported;Feet supported Sitting balance-Leahy Scale: Good     Standing balance support: Bilateral upper extremity supported;No upper extremity supported;During functional activity Standing balance-Leahy Scale: Fair Standing balance comment: Able to maintain static standing without UE support, however, benefited from BUE support during dynamic tasks.                              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 Pt taking increased time to answer questions. Decreased STM as pt with difficulty recalling 2/3 words in 3 word recall. Question possible vision deficits, however, pt reporting none.       General Comments      Exercises     Assessment/Plan    PT Assessment Patient needs continued PT services  PT Problem List Decreased strength;Decreased balance;Decreased mobility;Decreased cognition;Decreased knowledge of use of DME;Decreased safety awareness;Decreased knowledge of precautions       PT Treatment Interventions Gait training;DME instruction;Functional mobility training;Therapeutic activities;Stair training;Therapeutic exercise;Balance training;Patient/family education    PT Goals (Current goals can be found in the Care Plan section)  Acute Rehab PT Goals Patient Stated Goal: to go back to work PT Goal Formulation: With patient Time For Goal Achievement: 06/30/20 Potential to Achieve Goals: Good    Frequency Min 3X/week   Barriers to discharge        Co-evaluation PT/OT/SLP Co-Evaluation/Treatment: Yes Reason for Co-Treatment: Complexity of the patient's impairments (multi-system involvement);To address functional/ADL transfers PT goals addressed during session: Mobility/safety with mobility;Balance         AM-PAC PT "6 Clicks" Mobility  Outcome Measure Help needed turning from your back to your side while in a flat bed without using bedrails?: None Help needed moving from lying on  your back to sitting on the side of a flat bed without using bedrails?: None Help needed moving to and from a bed to a chair (including a wheelchair)?: A Little Help needed standing up from a chair using your arms (e.g., wheelchair or bedside chair)?: A Little Help needed to walk in hospital room?: A Little Help needed climbing 3-5 steps with a railing? : A Lot 6 Click Score: 19    End of Session Equipment Utilized During Treatment: Gait belt Activity Tolerance: Patient tolerated treatment well Patient left: in chair;with call bell/phone within reach;with family/visitor present;with chair alarm set Nurse Communication: Mobility status PT Visit Diagnosis: Unsteadiness on feet (R26.81);Muscle weakness (generalized) (M62.81);Other symptoms and signs involving the nervous system (H88.502)    Time: 7741-2878 PT Time Calculation (min) (ACUTE ONLY): 25 min   Charges:   PT Evaluation $PT Eval Moderate Complexity: 1 Mod          Reuel Derby, PT, DPT  Acute Rehabilitation Services  Pager: [phone] Office: [phone]   Rudean Hitt 06/16/2020, 3:06 PM

## 2020-06-16 NOTE — Discharge Summary (Signed)
Physician Discharge Summary  Nimesh Riolo ZMO:[id number] DOB: 04-03-1961 DOA: 06/09/2020  PCP: Patient, No Pcp Per  Admit date: 06/09/2020 Discharge date: 06/16/2020  Admitted From: Home Discharge disposition: Home   Code Status: Full Code  Diet Recommendation: Regular diet  Discharge Diagnosis:   Principal Problem:   Brain mass Active Problems:   Hyponatremia  History of Present Illness / Brief narrative:  Hunter Reyesis a 59 y.o. 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  He did not seek any further medical evaluation after the diagnosis was made. Patient was brought to the ED on 10/29 for altered mental status.   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   Patient also had a low sodium level 122. Admitted for further evaluation management.  Subjective:  Seen and examined this morning.  Middle-aged Hispanic male.  Taking his breakfast.  Feels better.  Intermittent dizziness present.  Wife at bedside. Underwent stereotactic brain biopsy yesterday.  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 -CT chest/abd pelvis showed no other concerning masses -Neurosurgery consultation appreciated. -11/4, he underwent a stereotactic needle biopsy of brain mass by neurosurgery. -Postop CT scan today looks stable.  Cleared by neurosurgery for discharge.  Patient to follow-up with neurosurgery as an outpatient for further evaluation management.  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 -Nephrology consultation appreciated. -He was briefly treated with demeclocycline and oral Lasix. -Sodium level subsequently improved and remained stable. 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 -Patient underwent long-term EEG monitoring as well. -Per neurology recommendation, patient will be discharged on Keppra 1500 mg twice daily, lacosamide 200 mg twice daily, Dilantin 100 mg 3 times daily and Klonopin 2 mg 3 times daily. -Also to continue dexamethasone taper. -Seizure precautions including not to drive for 6 months at least.  Acute metabolic encephalopathy -During this hospitalization, patient was intermittently agitated, trying to get out of bed, he required restraints.  -Mental status has improved now but patient is only oriented to place at this time.  Stable for discharge today    Wound care: Incision (Closed) 06/15/20 Head (Active)  Date First Assessed/Time First Assessed: 06/15/20 0921   Location: Head    Assessments 06/15/2020  9:39 AM 06/15/2020  8:00 PM  Dressing Type -- None  Site / Wound Assessment -- Clean;Dry  Drainage Amount None None     No Linked orders to display    Discharge Exam:   Vitals:   06/16/20 0800 06/16/20 0900 06/16/20 1000 06/16/20 1100  BP: 106/65 121/65 118/71 126/85  Pulse: 75 83 97 98  Resp: 13 15 16 17   Temp: 98.3 F (36.8 C)     TempSrc: Oral     SpO2: 95% 97% 97% 98%  Weight:      Height:        Body mass index is 26.6 kg/m.  General exam: Appears calm and comfortable.  Not in physical distress Skin: No rashes, lesions or ulcers. HEENT: Atraumatic, normocephalic, supple neck, no  obvious bleeding Lungs: Clear to auscultation bilaterally CVS: Regular rate and rhythm, no murmur GI/Abd soft, nontender, nondistended, bowel sound present CNS: Alert, awake, oriented to place and person Psychiatry: Mood appropriate Extremities: No pedal edema, no  calf tenderness  Follow ups:   Discharge Instructions    Diet general   Complete by: As directed    Increase activity slowly   Complete by: As directed    No dressing needed   Complete by: As directed       Follow-up [facility] Follow up.   Why: Please use this location for your medications. Their pharmacy will help with the cost of medications Contact information: [place] [address] [phone]       [name], MD Follow up in 1 week(s).   Specialty: Neurosurgery Contact information: [address] [place] [address] [phone]        Ventura Sellers, MD Follow up.   Specialties: Psychiatry, Neurology, Oncology Contact information: [place] [address] [phone]               Recommendations for Outpatient Follow-Up:   1. Follow-up with PCP as an outpatient 2. Follow-up with neurosurgery and neuro oncology as an outpatient  Discharge Instructions:  Follow with Primary MD Patient, No Pcp Per in 7 days   Get CBC/BMP checked in next visit within 1 week by PCP or SNF MD ( we routinely change or add medications that can affect your baseline labs and fluid status, therefore we recommend that you get the mentioned basic workup next visit with your PCP, your PCP may decide not to get them or add new tests based on their clinical decision)  On your next visit with your PCP, please Get Medicines reviewed and adjusted.  Please request your PCP  to go over all Hospital Tests and Procedure/Radiological results at the follow up, please get all Hospital records sent to your Prim MD by signing hospital release before you go home.  Activity: As tolerated with Full fall precautions use walker/cane & assistance as needed  For Heart failure patients - Check your Weight same time everyday, if you gain over 2 pounds, or you develop in leg  swelling, experience more shortness of breath or chest pain, call your Primary MD immediately. Follow Cardiac Low Salt Diet and 1.5 lit/day fluid restriction.  If you have smoked or chewed Tobacco in the last 2 yrs please stop smoking, stop any regular Alcohol  and or any Recreational drug use.  If you experience worsening of your admission symptoms, develop shortness of breath, life threatening emergency, suicidal or homicidal thoughts you must seek medical attention immediately by calling 911 or calling your MD immediately  if symptoms less severe.  You Must read complete instructions/literature along with all the possible adverse reactions/side effects for all the Medicines you take and that have been prescribed to you. Take any new Medicines after you have completely understood and accpet all the possible adverse reactions/side effects.   Do not drive, operate heavy machinery, perform activities at heights, swimming or participation in water activities or provide baby sitting services if your were admitted for syncope or siezures until you have seen by Primary MD or a Neurologist and advised to do so again.  Do not drive when taking Pain medications.  Do not take more than prescribed Pain, Sleep and Anxiety Medications  Wear Seat  belts while driving.   Please note You were cared for by a hospitalist during your hospital stay. If you have any questions about your discharge medications or the care you received while you were in the hospital after you are discharged, you can call the unit and asked to speak with the hospitalist on call if the hospitalist that took care of you is not available. Once you are discharged, your primary care physician will handle any further medical issues. Please note that NO REFILLS for any discharge medications will be authorized once you are discharged, as it is imperative that you return to your primary care physician (or establish a relationship with a primary care  physician if you do not have one) for your aftercare needs so that they can reassess your need for medications and monitor your lab values.    Allergies as of 06/16/2020   No Known Allergies     Medication List    TAKE these medications   clonazePAM 2 MG tablet Commonly known as: KLONOPIN Take 1 tablet (2 mg total) by mouth 3 (three) times daily.   dexamethasone 1 MG tablet Commonly known as: DECADRON 4 mg TID for 2 days, 2 mg TID for 2 days, 2 mg BID for 2 days, 1 mg BID for 2 days, 1 mg daily for 2 days.   HYDROcodone-acetaminophen 5-325 MG tablet Commonly known as: NORCO/VICODIN Take 1 tablet by mouth every 6 (six) hours as needed for up to 5 days for moderate pain.   lacosamide 200 MG Tabs tablet Commonly known as: VIMPAT Take 1 tablet (200 mg total) by mouth 2 (two) times daily.   levETIRAcetam 750 MG tablet Commonly known as: KEPPRA Take 2 tablets (1,500 mg total) by mouth 2 (two) times daily.   phenytoin 50 MG tablet Commonly known as: DILANTIN Chew 2 tablets (100 mg total) by mouth 3 (three) times daily.            Discharge Care Instructions  (From admission, onward)         Start     Ordered   06/16/20 0000  No dressing needed        06/16/20 1148          Time coordinating discharge: 35 minutes  The results of significant diagnostics from this hospitalization (including imaging, microbiology, ancillary and laboratory) are listed below for reference.    Procedures and Diagnostic Studies:   EEG  Result Date: 06/10/2020 Greta Doom, MD     06/10/2020 10:25 AM History: 59 year old male with no mass presented with altered mental status Sedation: None Technique: This is a 21 channel routine scalp EEG performed at the bedside with bipolar and monopolar montages arranged in accordance to the international 10/20 system of electrode placement. One channel was dedicated to EKG recording. Background: The background consists of intermixed alpha  and beta activities. There is a well defined posterior dominant rhythm of 9 hz that attenuates with eye opening.  There is focal left frontotemporal slowing.  He had three seizures lasting 2:07, 2:42 and 1:27 with poorly localized left hemispheric onset. No definite clinical correlate was seen, though with the first there is possibly some liip smacking, but not clear on video due to darkening for photic stimulation. Photic stimulation and HV were performed. EEG Abnormalities: 1) Three focal left hemispheric seizures. As above. 2) Left frontotemporal seizures Clinical Interpretation: This EEG recorded three discrete focal seizures arising from the left hemisphere with no definite clinical correlate. Roland Rack,  MD [facility] [phone] If 7pm- 7am, please page neurology on call as listed in [facility].   DG Chest 1 View  Result Date: 06/10/2020 CLINICAL DATA:  Altered mental status EXAM: CHEST  1 VIEW COMPARISON:  None. FINDINGS: Benign calcified granuloma noted at the left lung base. Lungs are otherwise clear. No pneumothorax or pleural effusion. Cardiac size within normal limits. Pulmonary vascularity is normal. No acute bone abnormality. IMPRESSION: No active disease. Electronically Signed   By: Fidela Salisbury MD   On: 06/10/2020 01:06   DG Abdomen 1 View  Result Date: 06/10/2020 CLINICAL DATA:  MRI clearance EXAM: ABDOMEN - 1 VIEW COMPARISON:  None. FINDINGS: The bowel gas pattern is normal. No radio-opaque calculi or other significant radiographic abnormality are seen. No metallic foreign body. IMPRESSION: No metallic foreign body. Nonobstructive bowel gas pattern. Electronically Signed   By: Ulyses Jarred M.D.   On: 06/10/2020 01:07   CT HEAD WO CONTRAST  Addendum Date: 06/09/2020   ADDENDUM REPORT: 06/09/2020 22:24 ADDENDUM: These results were called by telephone at the time of interpretation on 06/09/2020 at 10:13 pm to provider Dr. Alvino Chapel, who verbally acknowledged these  results. Electronically Signed   By: Primitivo Gauze M.D.   On: 06/09/2020 22:24   Result Date: 06/09/2020 CLINICAL DATA:  Mental status change, unknown cause. EXAM: CT HEAD WITHOUT CONTRAST TECHNIQUE: Contiguous axial images were obtained from the base of the skull through the vertex without intravenous contrast. COMPARISON:  12/18/2019 MRI/MRA head.  12/12/2019 head CT. FINDINGS: Brain: Redemonstration of infiltrative hypodensity involving the left temporal lobe. Partial effacement of the left lateral ventricle, increased when compared to prior MRI. Abutment of the left midbrain by the mesial left temporal lobe appears more conspicuous. New rightward midline shift of 4 mm. No ventriculomegaly or extra-axial fluid collection. No intracranial hemorrhage. Vascular: No hyperdense vessel or unexpected calcification. Skull: No acute finding. Sinuses/Orbits: Normal orbits. Clear paranasal sinuses. No mastoid effusion. Other: None. IMPRESSION: Redemonstration of infiltrative left temporal lesion. Increased partial effacement of the left lateral ventricle and abutment of the left midbrain. New rightward midline shift of 4 mm. MRI head with and without contrast is recommended for better evaluation. Electronically Signed: By: Primitivo Gauze M.D. On: 06/09/2020 22:06   CT CHEST W CONTRAST  Result Date: 06/10/2020 CLINICAL DATA:  Brain/CNS neoplasm. Evaluate for metastatic disease/primary neoplasm. EXAM: CT CHEST, ABDOMEN, AND PELVIS WITH CONTRAST TECHNIQUE: Multidetector CT imaging of the chest, abdomen and pelvis was performed following the standard protocol during bolus administration of intravenous contrast. CONTRAST:  195mL OMNIPAQUE IOHEXOL 300 MG/ML  SOLN COMPARISON:  Brain MRI, 06/10/2020. Abdomen radiographs, 06/10/2020. FINDINGS: CT CHEST FINDINGS Cardiovascular: Heart normal in size and configuration. No pericardial effusion. No coronary artery calcifications. Normal great vessels and widely patent  aortic arch branch vessels. Mediastinum/Nodes: Normal thyroid. No neck base, axillary, mediastinal or hilar masses or enlarged lymph nodes. Normal trachea and esophagus. Lungs/Pleura: Minor dependent atelectasis. Calcified granuloma in the left upper lobe lingula. No evidence of pneumonia or pulmonary edema. No lung mass or suspicious nodule. No pleural effusion or pneumothorax. Musculoskeletal: No fracture or bone lesion. No significant skeletal abnormality. No chest wall mass. CT ABDOMEN PELVIS FINDINGS Hepatobiliary: No focal liver abnormality is seen. No gallstones, gallbladder wall thickening, or biliary dilatation. Pancreas: Unremarkable. No pancreatic ductal dilatation or surrounding inflammatory changes. Spleen: Normal in size without focal abnormality. Adrenals/Urinary Tract: Normal adrenal glands. Kidneys normal in size, orientation and position with symmetric enhancement and excretion. No renal masses. No  hydronephrosis. Normal ureters. Bladder unremarkable. Stomach/Bowel: Stomach is within normal limits. Appendix appears normal. No evidence of bowel wall thickening, distention, or inflammatory changes. Vascular/Lymphatic: No significant vascular findings are present. No enlarged abdominal or pelvic lymph nodes. Reproductive: Prostate mildly enlarged, 4.8 x 3.5 cm transversely. Other: No abdominal wall hernia or abnormality. No abdominopelvic ascites. Musculoskeletal: Chronic bilateral pars defects at L5-S1. Minimal anterolisthesis. Skeletal structures otherwise unremarkable. IMPRESSION: 1. No evidence of a primary malignancy or metastatic disease within the chest, abdomen or pelvis. 2. No acute findings within the chest, abdomen or pelvis. Electronically Signed   By: Lajean Manes M.D.   On: 06/10/2020 09:52   MR Brain W and Wo Contrast  Result Date: 06/10/2020 CLINICAL DATA:  Brain mass EXAM: MRI HEAD WITHOUT AND WITH CONTRAST TECHNIQUE: Multiplanar, multiecho pulse sequences of the brain and  surrounding structures were obtained without and with intravenous contrast. CONTRAST:  7.77mL GADAVIST GADOBUTROL 1 MMOL/ML IV SOLN COMPARISON:  Brain MRI 12/18/2019 FINDINGS: Brain: No acute infarct, acute hemorrhage or extra-axial collection. Slight worsening of abnormal hyperintense T2-weighted signal within the left temporal lobe. Mass effect on the left lateral ventricle and brainstem is slightly increased. There is no contrast enhancement within the lesion. No chronic microhemorrhage. Normal midline structures. Vascular: Normal flow voids. Skull and upper cervical spine: Normal marrow signal. Sinuses/Orbits: Negative. Other: None. 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 Electronically Signed   By: Ulyses Jarred M.D.   On: 06/10/2020 03:12   CT ABDOMEN PELVIS W CONTRAST  Result Date: 06/10/2020 CLINICAL DATA:  Brain/CNS neoplasm. Evaluate for metastatic disease/primary neoplasm. EXAM: CT CHEST, ABDOMEN, AND PELVIS WITH CONTRAST TECHNIQUE: Multidetector CT imaging of the chest, abdomen and pelvis was performed following the standard protocol during bolus administration of intravenous contrast. CONTRAST:  138mL OMNIPAQUE IOHEXOL 300 MG/ML  SOLN COMPARISON:  Brain MRI, 06/10/2020. Abdomen radiographs, 06/10/2020. FINDINGS: CT CHEST FINDINGS Cardiovascular: Heart normal in size and configuration. No pericardial effusion. No coronary artery calcifications. Normal great vessels and widely patent aortic arch branch vessels. Mediastinum/Nodes: Normal thyroid. No neck base, axillary, mediastinal or hilar masses or enlarged lymph nodes. Normal trachea and esophagus. Lungs/Pleura: Minor dependent atelectasis. Calcified granuloma in the left upper lobe lingula. No evidence of pneumonia or pulmonary edema. No lung mass or suspicious nodule. No pleural effusion or pneumothorax. Musculoskeletal: No  fracture or bone lesion. No significant skeletal abnormality. No chest wall mass. CT ABDOMEN PELVIS FINDINGS Hepatobiliary: No focal liver abnormality is seen. No gallstones, gallbladder wall thickening, or biliary dilatation. Pancreas: Unremarkable. No pancreatic ductal dilatation or surrounding inflammatory changes. Spleen: Normal in size without focal abnormality. Adrenals/Urinary Tract: Normal adrenal glands. Kidneys normal in size, orientation and position with symmetric enhancement and excretion. No renal masses. No hydronephrosis. Normal ureters. Bladder unremarkable. Stomach/Bowel: Stomach is within normal limits. Appendix appears normal. No evidence of bowel wall thickening, distention, or inflammatory changes. Vascular/Lymphatic: No significant vascular findings are present. No enlarged abdominal or pelvic lymph nodes. Reproductive: Prostate mildly enlarged, 4.8 x 3.5 cm transversely. Other: No abdominal wall hernia or abnormality. No abdominopelvic ascites. Musculoskeletal: Chronic bilateral pars defects at L5-S1. Minimal anterolisthesis. Skeletal structures otherwise unremarkable. IMPRESSION: 1. No evidence of a primary malignancy or metastatic disease within the chest, abdomen or pelvis. 2. No acute findings within the chest, abdomen or pelvis. Electronically Signed   By: Lajean Manes M.D.   On: 06/10/2020 09:52  Labs:   Basic Metabolic Panel: Recent Labs  Lab 06/10/20 1337 06/10/20 1337 06/10/20 2317 06/11/20 1144 06/12/20 0144 06/12/20 1349 06/13/20 0430 06/13/20 0430 06/14/20 0252 06/14/20 0252 06/15/20 0140 06/16/20 0115  NA 125*  --    < >  --    < > 136 135  --  136  --  135 136  K 4.0   < >  --   --   --   --  4.4   < > 4.2   < > 4.4 4.2  CL 90*  --   --   --   --   --  98  --  99  --  100 101  CO2 27  --   --   --   --   --  28  --  27  --  25 25  GLUCOSE 118*  --   --   --   --   --  116*  --  129*  --  134* 131*  BUN 8  --   --   --   --   --  14  --  13  --  13  12  CREATININE 0.71  --   --   --   --   --  1.01  --  0.79  --  0.88 0.85  CALCIUM 8.6*  --   --   --   --   --  9.1  --  9.1  --  9.2 8.9  MG  --   --   --  2.0  --   --   --   --   --   --   --   --   PHOS  --   --   --   --   --   --  4.6  --  4.2  --  4.3  --    < > = values in this interval not displayed.   GFR Estimated Creatinine Clearance: 81.4 mL/min (by C-G formula based on SCr of 0.85 mg/dL). Liver Function Tests: Recent Labs  Lab 06/09/20 2055 06/13/20 0430 06/14/20 0252 06/15/20 0140  AST 30  --   --   --   ALT 23  --   --   --   ALKPHOS 55  --   --   --   BILITOT 1.0  --   --   --   PROT 7.7  --   --   --   ALBUMIN 4.5 3.6 3.5 3.6   No results for input(s): LIPASE, AMYLASE in the last 168 hours. No results for input(s): AMMONIA in the last 168 hours. Coagulation profile Recent Labs  Lab 06/09/20 2055  INR 1.0    CBC: Recent Labs  Lab 06/09/20 2055 06/09/20 2111 06/10/20 0309 06/16/20 0115  WBC 9.1  --  9.6 7.7  NEUTROABS 7.7  --  7.5 5.7  HGB 14.9 14.6 13.1 14.3  HCT 43.6 43.0 38.2* 42.5  MCV 82.1  --  83.4 83.8  PLT 307  --  283 318   Cardiac Enzymes: No results for input(s): CKTOTAL, CKMB, CKMBINDEX, TROPONINI in the last 168 hours. BNP: Invalid input(s): POCBNP CBG: Recent Labs  Lab 06/09/20 2019  GLUCAP 129*   D-Dimer No results for input(s): DDIMER in the last 72 hours. Hgb A1c No results for input(s): HGBA1C in the last 72 hours. Lipid Profile No results for input(s): CHOL, HDL, LDLCALC, TRIG, CHOLHDL,  LDLDIRECT in the last 72 hours. Thyroid function studies No results for input(s): TSH, T4TOTAL, T3FREE, THYROIDAB in the last 72 hours.  Invalid input(s): FREET3 Anemia work up No results for input(s): VITAMINB12, FOLATE, FERRITIN, TIBC, IRON, RETICCTPCT in the last 72 hours. Microbiology Recent Results (from the past 240 hour(s))  Respiratory Panel by RT PCR (Flu A&B, Covid) - Nasopharyngeal Swab     Status: None   Collection  Time: 06/09/20  8:55 PM   Specimen: Nasopharyngeal Swab  Result Value Ref Range Status   SARS Coronavirus 2 by RT PCR NEGATIVE NEGATIVE Final    Comment: (NOTE) SARS-CoV-2 target nucleic acids are NOT DETECTED.  The SARS-CoV-2 RNA is generally detectable in upper respiratoy specimens during the acute phase of infection. The lowest concentration of SARS-CoV-2 viral copies this assay can detect is 131 copies/mL. A negative result does not preclude SARS-Cov-2 infection and should not be used as the sole basis for treatment or other patient management decisions. A negative result may occur with  improper specimen collection/handling, submission of specimen other than nasopharyngeal swab, presence of viral mutation(s) within the areas targeted by this assay, and inadequate number of viral copies (<131 copies/mL). A negative result must be combined with clinical observations, patient history, and epidemiological information. The expected result is Negative.  Fact Sheet for Patients:  PinkCheek.be  Fact Sheet for Healthcare Providers:  GravelBags.it  This test is no t yet approved or cleared by the [place] FDA and  has been authorized for detection and/or diagnosis of SARS-CoV-2 by FDA under an Emergency Use Authorization (EUA). This EUA will remain  in effect (meaning this test can be used) for the duration of the COVID-19 declaration under Section 564(b)(1) of the Act, 21 U.S.C. section 360bbb-3(b)(1), unless the authorization is terminated or revoked sooner.     Influenza A by PCR NEGATIVE NEGATIVE Final   Influenza B by PCR NEGATIVE NEGATIVE Final    Comment: (NOTE) The Xpert Xpress SARS-CoV-2/FLU/RSV assay is intended as an aid in  the diagnosis of influenza from Nasopharyngeal swab specimens and  should not be used as a sole basis for treatment. Nasal washings and  aspirates are unacceptable for Xpert Xpress  SARS-CoV-2/FLU/RSV  testing.  Fact Sheet for Patients: PinkCheek.be  Fact Sheet for Healthcare Providers: GravelBags.it  This test is not yet approved or cleared by the [place] FDA and  has been authorized for detection and/or diagnosis of SARS-CoV-2 by  FDA under an Emergency Use Authorization (EUA). This EUA will remain  in effect (meaning this test can be used) for the duration of the  Covid-19 declaration under Section 564(b)(1) of the Act, 21  U.S.C. section 360bbb-3(b)(1), unless the authorization is  terminated or revoked. Performed at [facility], [place] [address], [place], [place] [address]   MRSA PCR Screening     Status: None   Collection Time: 06/14/20  1:47 PM   Specimen: Nasopharyngeal  Result Value Ref Range Status   MRSA by PCR NEGATIVE NEGATIVE Final    Comment:        The GeneXpert MRSA Assay (FDA approved for NASAL specimens only), is one component of a comprehensive MRSA colonization surveillance program. It is not intended to diagnose MRSA infection nor to guide or monitor treatment for MRSA infections. Performed at [facility], [place] [address] [place],  [address]      Signed: Terrilee Croak  [facility] 06/16/2020, 11:48 AM

## 2020-06-16 NOTE — Progress Notes (Signed)
Subjective: Patient reports no headaches.  Objective: Vital signs in last 24 hours: Temp:  [97.7 F (36.5 C)-98.7 F (37.1 C)] 98.3 F (36.8 C) (11/05 0800) Pulse Rate:  [79-114] 88 (11/05 0700) Resp:  [11-22] 22 (11/05 0700) BP: (102-136)/(57-91) 119/81 (11/05 0700) SpO2:  [92 %-100 %] 100 % (11/05 0700) Arterial Line BP: (114-173)/(54-86) 130/65 (11/04 1330)  Intake/Output from previous day: 11/04 0701 - 11/05 0700 In: 1460 [P.O.:360; I.V.:1000; IV Piggyback:100] Out: 410 [Urine:400; Blood:10] Intake/Output this shift: No intake/output data recorded.  Slightly drowsy, oriented to person, place. No facial droop, no pronator drift Incision c/d/i Speech fluent but slow   Lab Results: Recent Labs    06/16/20 0115  WBC 7.7  HGB 14.3  HCT 42.5  PLT 318   BMET Recent Labs    06/15/20 0140 06/16/20 0115  NA 135 136  K 4.4 4.2  CL 100 101  CO2 25 25  GLUCOSE 134* 131*  BUN 13 12  CREATININE 0.88 0.85  CALCIUM 9.2 8.9    Studies/Results: CT HEAD WO CONTRAST  Result Date: 06/16/2020 CLINICAL DATA:  Brain mass or lesion; status post biopsy. EXAM: CT HEAD WITHOUT CONTRAST TECHNIQUE: Contiguous axial images were obtained from the base of the skull through the vertex without intravenous contrast. COMPARISON:  Brain MRI 06/14/2020. FINDINGS: Brain: A small biopsy tract is now present within the lateral aspect of the left temporal lobe, at site of a large infiltrative left temporal lobe mass. There is a small amount of expected postprocedural blood products at the biopsy site and trace adjacent subarachnoid hemorrhage. Additionally, there is trace overlying extra-axial pneumocephalus. Vascular: No hyperdense vessel. Skull: Normal. Negative for fracture or focal lesion. Sinuses/Orbits: Visualized orbits show no acute finding. No significant paranasal sinus disease at the imaged levels. IMPRESSION: Postprocedural changes from interval biopsy of a large infiltrative left temporal  lobe mass. This includes a small amount of expected blood products at the biopsy site and trace adjacent subarachnoid hemorrhage. Electronically Signed   By: Kellie Simmering DO   On: 06/16/2020 07:47   MR BRAIN WO CONTRAST  Result Date: 06/14/2020 CLINICAL DATA:  Brain mass or lesion. EXAM: MRI HEAD WITHOUT CONTRAST TECHNIQUE: Multiplanar, multiecho pulse sequences of the brain and surrounding structures were obtained without intravenous contrast. COMPARISON:  Brain MRI 06/10/2020. Head CT 06/09/2020. Brain MRI 12/18/2019. FINDINGS: Brain: The patient was unable to tolerate the full examination. Only a sagittal T1/FLAIR sequence, axial T2 weighted sequence, sagittal 3D T2/FLAIR sequence, axial DTI sequence, axial trace DWI sequence and axial SWI could be obtained. The acquired sequences are significantly motion degraded. Most notably, there is moderate motion degradation of the sagittal 3D T2/FLAIR sequence and moderate motion degradation of the axial SWI sequence. As compared to the recent prior MRI of 06/10/2020, no signified interval change in extent of a large infiltrative T2/FLAIR hyperintense mass involving much of the left temporal lobe. As before, there is involvement of both the cortex and white matter, and of the left hippocampus. Additionally, there is extension of signal abnormality into the left temporal stem and into the posterior left subinsular white matter. Unchanged partial effacement of the left lateral ventricle. Unchanged mass effect upon the left aspect of the midbrain. 2 mm rightward midline shift is also unchanged. Stable background mild multifocal T2/FLAIR hyperintensity within the cerebral white matter which is nonspecific, but compatible with chronic small vessel ischemic disease. There is no acute infarct. No chronic intracranial blood products are identified. No extra-axial fluid collection. Vascular:  Expected proximal arterial flow voids. Skull and upper cervical spine: No focal marrow  lesion. Sinuses/Orbits: Visualized orbits show no acute finding. Trace ethmoid sinus mucosal thickening. No significant mastoid effusion. IMPRESSION: Prematurely terminated and motion degraded examination as described. As compared to the prior MRI of 06/10/2020, unchanged extent of a large infiltrative T2/FLAIR hyperintense mass involving much of the left temporal lobe. As before, there is involvement of both the cortex and white matter, and of the left hippocampus. Unchanged extension of signal abnormality into the left temporal stem and into the posterior left subinsular white matter. Unchanged partial effacement of the left lateral ventricle, mass effect on the left aspect of the midbrain and 2 mm rightward midline shift. Electronically Signed   By: Kellie Simmering DO   On: 06/14/2020 19:06    Assessment/Plan: S/p biopsy of left temporal lobe lesion - CT reviewed and shows expected postop changes - continue dex taper - from neurosurgical standpoint, ok for discharge with f/u in clinic in 1-2 weeks, as well as f/u with Dr. Mickeal Skinner of neuro-oncology - may benefit from PT prior to discharge - his case will be discussed at tumor board once the pathology results returns    [name] 06/16/2020, 9:40 AM

## 2020-06-16 NOTE — Plan of Care (Cosign Needed)
Problem: Education: Goal: Knowledge of General Education information will improve Description: Including pain rating scale, medication(s)/side effects and non-pharmacologic comfort measures 06/16/2020 1502 by Charise Carwin, RN Outcome: Adequate for Discharge 06/16/2020 1502 by Charise Carwin, RN Outcome: Adequate for Discharge   Problem: Health Behavior/Discharge Planning: Goal: Ability to manage health-related needs will improve 06/16/2020 1502 by Charise Carwin, RN Outcome: Adequate for Discharge 06/16/2020 1502 by Charise Carwin, RN Outcome: Adequate for Discharge   Problem: Clinical Measurements: Goal: Ability to maintain clinical measurements within normal limits will improve 06/16/2020 1502 by Charise Carwin, RN Outcome: Adequate for Discharge 06/16/2020 1502 by Charise Carwin, RN Outcome: Adequate for Discharge Goal: Will remain free from infection 06/16/2020 1502 by Charise Carwin, RN Outcome: Adequate for Discharge 06/16/2020 1502 by Charise Carwin, RN Outcome: Adequate for Discharge Goal: Diagnostic test results will improve 06/16/2020 1502 by Charise Carwin, RN Outcome: Adequate for Discharge 06/16/2020 1502 by Charise Carwin, RN Outcome: Adequate for Discharge Goal: Respiratory complications will improve 06/16/2020 1502 by Charise Carwin, RN Outcome: Adequate for Discharge 06/16/2020 1502 by Charise Carwin, RN Outcome: Adequate for Discharge Goal: Cardiovascular complication will be avoided 06/16/2020 1502 by Charise Carwin, RN Outcome: Adequate for Discharge 06/16/2020 1502 by Charise Carwin, RN Outcome: Adequate for Discharge   Problem: Activity: Goal: Risk for activity intolerance will decrease 06/16/2020 1502 by Charise Carwin, RN Outcome: Adequate for Discharge 06/16/2020 1502 by Charise Carwin, RN Outcome: Adequate for Discharge   Problem: Nutrition: Goal: Adequate nutrition will be maintained 06/16/2020 1502 by Charise Carwin, RN Outcome:  Adequate for Discharge 06/16/2020 1502 by Charise Carwin, RN Outcome: Adequate for Discharge   Problem: Coping: Goal: Level of anxiety will decrease 06/16/2020 1502 by Charise Carwin, RN Outcome: Adequate for Discharge 06/16/2020 1502 by Charise Carwin, RN Outcome: Adequate for Discharge   Problem: Elimination: Goal: Will not experience complications related to bowel motility 06/16/2020 1502 by Charise Carwin, RN Outcome: Adequate for Discharge 06/16/2020 1502 by Charise Carwin, RN Outcome: Adequate for Discharge Goal: Will not experience complications related to urinary retention 06/16/2020 1502 by Charise Carwin, RN Outcome: Adequate for Discharge 06/16/2020 1502 by Charise Carwin, RN Outcome: Adequate for Discharge   Problem: Pain Managment: Goal: General experience of comfort will improve 06/16/2020 1502 by Charise Carwin, RN Outcome: Adequate for Discharge 06/16/2020 1502 by Charise Carwin, RN Outcome: Adequate for Discharge   Problem: Safety: Goal: Ability to remain free from injury will improve 06/16/2020 1502 by Charise Carwin, RN Outcome: Adequate for Discharge 06/16/2020 1502 by Charise Carwin, RN Outcome: Adequate for Discharge   Problem: Skin Integrity: Goal: Risk for impaired skin integrity will decrease 06/16/2020 1502 by Charise Carwin, RN Outcome: Adequate for Discharge 06/16/2020 1502 by Charise Carwin, RN Outcome: Adequate for Discharge   Problem: Education: Goal: Expressions of having a comfortable level of knowledge regarding the disease process will increase 06/16/2020 1502 by Charise Carwin, RN Outcome: Adequate for Discharge 06/16/2020 1502 by Charise Carwin, RN Outcome: Adequate for Discharge   Problem: Coping: Goal: Ability to adjust to condition or change in health will improve 06/16/2020 1502 by Charise Carwin, RN Outcome: Adequate for Discharge 06/16/2020 1502 by Charise Carwin, RN Outcome: Adequate for Discharge Goal: Ability to  identify appropriate support needs will improve 06/16/2020 1502 by Charise Carwin, RN Outcome: Adequate for Discharge 06/16/2020 1502 by Renee Ramus,  Marisa Severin, RN Outcome: Adequate for Discharge   Problem: Health Behavior/Discharge Planning: Goal: Compliance with prescribed medication regimen will improve 06/16/2020 1502 by Charise Carwin, RN Outcome: Adequate for Discharge 06/16/2020 1502 by Charise Carwin, RN Outcome: Adequate for Discharge   Problem: Medication: Goal: Risk for medication side effects will decrease 06/16/2020 1502 by Charise Carwin, RN Outcome: Adequate for Discharge 06/16/2020 1502 by Charise Carwin, RN Outcome: Adequate for Discharge   Problem: Clinical Measurements: Goal: Complications related to the disease process, condition or treatment will be avoided or minimized 06/16/2020 1502 by Charise Carwin, RN Outcome: Adequate for Discharge 06/16/2020 1502 by Charise Carwin, RN Outcome: Adequate for Discharge Goal: Diagnostic test results will improve 06/16/2020 1502 by Charise Carwin, RN Outcome: Adequate for Discharge 06/16/2020 1502 by Charise Carwin, RN Outcome: Adequate for Discharge   Problem: Safety: Goal: Verbalization of understanding the information provided will improve 06/16/2020 1502 by Charise Carwin, RN Outcome: Adequate for Discharge 06/16/2020 1502 by Charise Carwin, RN Outcome: Adequate for Discharge   Problem: Self-Concept: Goal: Level of anxiety will decrease 06/16/2020 1502 by Charise Carwin, RN Outcome: Adequate for Discharge 06/16/2020 1502 by Charise Carwin, RN Outcome: Adequate for Discharge Goal: Ability to verbalize feelings about condition will improve 06/16/2020 1502 by Charise Carwin, RN Outcome: Adequate for Discharge 06/16/2020 1502 by Charise Carwin, RN Outcome: Adequate for Discharge

## 2020-06-16 NOTE — Progress Notes (Signed)
Discharge paperwork went over with wife at bedside with [place]. Medication administration instructions explained in spanish. Wife understands discharge information and medications.

## 2020-06-16 NOTE — TOC Transition Note (Signed)
Transition of Care [facility]) - CM/SW Discharge Note   Patient Details  Name: Zoey Gilkeson MRN: [id number] Date of Birth: Nov 12, 1960  [facility]) CM/SW Contact:  Ella Bodo, RN Phone Number: 06/16/2020, 3:52 PM   Clinical Narrative:  Pt is a 59 y/o male admitted secondary to AMS and seizure. Found to have worsening L temporal lobe mass and is s/p biopsy. Pt with hx of temporal lobe mass, however, no other pertinent PMH.  PTA, pt independent,lives at home with spouse.  PT/OT recommending OP follow up, and referrals made to [facility] OP Neuro Rehab for follow up.  Pt is uninsured, but is eligible for medication assistance through [facility] program. [facility] letter given with explanation of program benefits; dc meds sent to [place] to be filled using [place] letter.   Pt to follow up as OP at [facility] and [facility] for PCP.    Referral to [place] for RW, to be delivered to bedside prior to dc.         Final next level of care: OP Rehab Barriers to Discharge: Barriers Resolved   Patient Goals and CMS Choice     Choice offered to / list presented to : Spouse                        Discharge Plan and Services   Discharge Planning Services: CM Consult            DME Arranged: Gilford Rile rolling   Date DME Agency Contacted: 06/16/20 Time DME Agency Contacted: [id number] Representative spoke with at DME Agency: Texted to [place] [facility]ants of Health ([place]) Interventions     Readmission Risk Interventions Readmission Risk Prevention Plan 06/16/2020  Transportation Screening Complete  PCP or Specialist Appt within 5-7 Days Complete  Home Care Screening Complete  Medication Review (RN CM) Complete   Reinaldo Raddle, RN, BSN  Trauma/Neuro ICU Case Manager [phone]

## 2020-06-16 NOTE — Evaluation (Signed)
Occupational Therapy Evaluation Patient Details Name: Hunter Terry MRN: [id number] DOB: 1961/08/07 Today's Date: 06/16/2020    History of Present Illness Pt is a 59 y/o male admitted secondary to AMS and seizure. Found to have worsening L temporal lobe mass and is s/p biopsy. Pt with hx of temporal lobe mass, however, no other pertinent PMH.    Clinical Impression   PTA pt living with wife and functioning at independent community level. 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 At time of eval, pt able to perform sit <> stands and mobility with min guard-min A for steadying support. Pt with better balance with RW usage. Suspect possible visual deficits when observed navigating environment (difficult to fully assess due to cognition and language barrier). Noted cognitive deficits in memory, problem solving, and awareness of deficits. Educated wife on safe care for pt at home. Given current status, recommend OP OT at d/c. Will continue to follow per POC listed below.    Follow Up Recommendations  Outpatient OT;Supervision/Assistance - 24 hour    Equipment Recommendations  None recommended by OT    Recommendations for Other Services       Precautions / Restrictions Precautions Precautions: Fall Restrictions Weight Bearing Restrictions: No      Mobility Bed Mobility               General bed mobility comments: up in chair, returned to chair    Transfers Overall transfer level: Needs assistance Equipment used: None;Rolling walker (2 wheeled) Transfers: Sit to/from Stand Sit to Stand: Min guard         General transfer comment: Min guard for safety; improved mobility with RW    Balance Overall balance assessment: Needs assistance Sitting-balance support: No upper extremity supported;Feet supported Sitting balance-Leahy Scale: Good      Standing balance support: Bilateral upper extremity supported;No upper extremity supported;During functional activity Standing balance-Leahy Scale: Fair Standing balance comment: Able to maintain static standing without UE support, however, benefited from BUE support during dynamic tasks.                            ADL either performed or assessed with clinical judgement   ADL Overall ADL's : Needs assistance/impaired Eating/Feeding: Set up;Sitting   Grooming: Set up;Sitting   Upper Body Bathing: Set up;Sitting   Lower Body Bathing: Minimal assistance;Sit to/from stand;Sitting/lateral leans   Upper Body Dressing : Set up;Sitting   Lower Body Dressing: Minimal assistance;Sit to/from stand;Sitting/lateral leans   Toilet Transfer: Min guard;Minimal assistance;Ambulation;RW;BSC Toilet Transfer Details (indicate cue type and reason): fluctuating min guard-min A for balance and navigation Toileting- Clothing Manipulation and Hygiene: Set up;Sitting/lateral lean;Sit to/from stand       Functional mobility during ADLs: Minimal assistance;Min guard;Rolling walker General ADL Comments: noted to have poor dynamic balance and possible visual deficits     Vision Baseline Vision/History: No visual deficits Patient Visual Report: No change from baseline Vision Assessment?: Vision impaired- to be further tested in functional context Additional Comments: suspect possible visual impairments when observed with stair navigation (possible depth perception). Difficulty following commands with visual field testing for accurate assessment     Perception     Praxis      Pertinent Vitals/Pain Pain Assessment: No/denies pain     Hand Dominance  Extremity/Trunk Assessment Upper Extremity Assessment Upper Extremity Assessment: Overall WFL for tasks assessed   Lower Extremity Assessment Lower Extremity Assessment: Defer to PT evaluation   Cervical / Trunk  Assessment Cervical / Trunk Assessment: Normal   Communication Communication Communication: Interpreter utilized;Prefers language other than English (In person interpreter)   Cognition Arousal/Alertness: Awake/alert Behavior During Therapy: WFL for tasks assessed/performed Overall Cognitive Status: Impaired/Different from baseline Area of Impairment: Memory;Safety/judgement;Problem solving                     Memory: Decreased short-term memory (not able to recall 3 words after few minutes. Only able to recall 1)   Safety/Judgement: Decreased awareness of safety;Decreased awareness of deficits   Problem Solving: Slow processing;Requires verbal cues General Comments: interpreter utilized throughout; pt requiring repetition and increased time to verbalize thoughts. Showing decreased STM deficits as well as decreased safety awareness   General Comments       Exercises     Shoulder Instructions      Home Living Family/patient expects to be discharged to:: Private residence Living Arrangements: Spouse/significant other Available Help at Discharge: Family;Available 24 hours/day Type of Home: House Home Access: Stairs to enter [employer] of Steps: 3 Entrance Stairs-Rails: None Home Layout: One level     Bathroom Shower/Tub: Teacher, early years/pre: Standard     Home Equipment: None          Prior Functioning/Environment Level of Independence: Independent        Comments: Was a truck driver        OT Problem List: Decreased strength;Impaired vision/perception;Decreased knowledge of use of DME or AE;Decreased coordination;Decreased activity tolerance;Decreased cognition;Impaired balance (sitting and/or standing);Decreased safety awareness      OT Treatment/Interventions: Self-care/ADL training;Therapeutic exercise;Patient/family education;Visual/perceptual remediation/compensation;Balance training;Energy conservation;Therapeutic  activities;DME and/or AE instruction;Cognitive remediation/compensation    OT Goals(Current goals can be found in the care plan section) Acute Rehab OT Goals Patient Stated Goal: to go back to work OT Goal Formulation: With patient Time For Goal Achievement: 06/30/20 Potential to Achieve Goals: Good  OT Frequency: Min 2X/week   Barriers to D/C:            Co-evaluation PT/OT/SLP Co-Evaluation/Treatment: Yes Reason for Co-Treatment: Complexity of the patient's impairments (multi-system involvement);To address functional/ADL transfers PT goals addressed during session: Mobility/safety with mobility;Balance OT goals addressed during session: ADL's and self-care;Strengthening/ROM      AM-PAC OT "6 Clicks" Daily Activity     Outcome Measure Help from another person eating meals?: A Little Help from another person taking care of personal grooming?: A Little Help from another person toileting, which includes using toliet, bedpan, or urinal?: A Lot Help from another person bathing (including washing, rinsing, drying)?: A Lot Help from another person to put on and taking off regular upper body clothing?: A Little Help from another person to put on and taking off regular lower body clothing?: A Lot 6 Click Score: 15   End of Session Equipment Utilized During Treatment: Gait belt;Rolling walker Nurse Communication: Mobility status  Activity Tolerance: Patient tolerated treatment well Patient left: in chair;with call bell/phone within reach;with family/visitor present  OT Visit Diagnosis: Unsteadiness on feet (R26.81);Muscle weakness (generalized) (M62.81);Other symptoms and signs involving cognitive function                Time: 4270-6237 OT Time Calculation (min): 28 min Charges:  OT General Charges $OT Visit: 1 Visit OT Evaluation $OT Eval Moderate Complexity: 1 [address]  MSOT, OTR/L Acute Rehabilitation Services [facility] Office Number: [phone] Pager:  [phone]  Zenovia Jarred 06/16/2020, 3:41 PM

## 2020-06-20 LAB — NO BLOOD PRODUCTS

## 2020-06-21 ENCOUNTER — Other Ambulatory Visit: Payer: Self-pay | Admitting: Radiation Therapy

## 2020-06-29 ENCOUNTER — Inpatient Hospital Stay: Payer: Self-pay | Admitting: Physician Assistant

## 2020-07-04 ENCOUNTER — Inpatient Hospital Stay: Payer: Self-pay | Admitting: Internal Medicine

## 2020-07-05 ENCOUNTER — Inpatient Hospital Stay: Payer: Self-pay | Admitting: Family Medicine

## 2020-07-10 ENCOUNTER — Inpatient Hospital Stay: Payer: Self-pay | Admitting: Internal Medicine

## 2020-07-10 ENCOUNTER — Encounter (HOSPITAL_COMMUNITY): Payer: Self-pay | Admitting: Neurosurgery

## 2020-07-10 LAB — SURGICAL PATHOLOGY

## 2020-07-12 ENCOUNTER — Other Ambulatory Visit: Payer: Self-pay | Admitting: Radiation Therapy

## 2020-07-21 ENCOUNTER — Other Ambulatory Visit: Payer: Self-pay | Admitting: Surgery

## 2020-07-21 ENCOUNTER — Other Ambulatory Visit (HOSPITAL_COMMUNITY): Payer: Self-pay | Admitting: Neurosurgery

## 2020-07-21 ENCOUNTER — Other Ambulatory Visit: Payer: Self-pay | Admitting: Neurosurgery

## 2020-07-21 DIAGNOSIS — C719 Malignant neoplasm of brain, unspecified: Secondary | ICD-10-CM

## 2020-07-22 ENCOUNTER — Ambulatory Visit (HOSPITAL_COMMUNITY)
Admission: RE | Admit: 2020-07-22 | Discharge: 2020-07-22 | Disposition: A | Discharge status: Home / Self Care | Payer: Medicaid Other | Source: Ambulatory Visit | Attending: Neurosurgery | Admitting: Neurosurgery

## 2020-07-22 ENCOUNTER — Other Ambulatory Visit: Payer: Self-pay

## 2020-07-22 DIAGNOSIS — C719 Malignant neoplasm of brain, unspecified: Secondary | ICD-10-CM | POA: Diagnosis not present

## 2020-07-22 IMAGING — MR MR HEAD WO/W CM
16 of 17 series · 38 of 48 positions shown · IV contrast (gadavist)
Comparison: [DATE] MRI and prior, [DATE] CT

CLINICAL DATA: Glioma of brain

EXAM:
MRI HEAD WITHOUT AND WITH CONTRAST
TECHNIQUE: Multiplanar, multiecho pulse sequences of the brain and surrounding
structures were obtained without and with intravenous contrast.
CONTRAST:  7.5mL GADAVIST GADOBUTROL 1 MMOL/ML IV SOLN

[Series 3: ax dti · axial · 3.0mm · 0.94mm/px · z∈[-72,+111]mm · 14 of 1612 slices shown]
[im 1/1612]
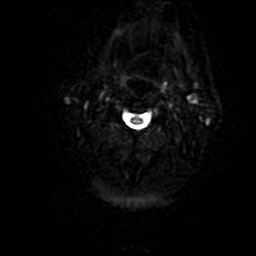
[im 124/1612]
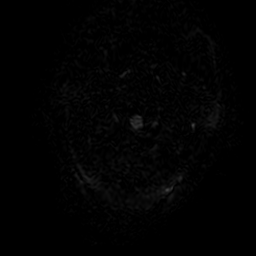
[im 248/1612]
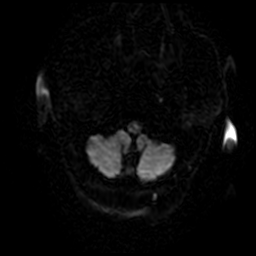
[im 372/1612]
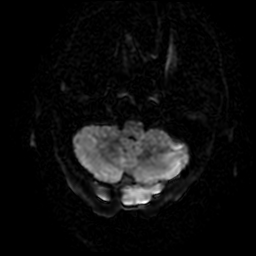
[im 496/1612]
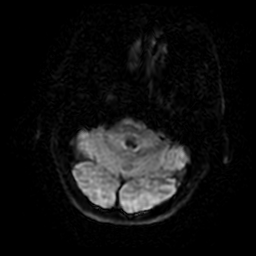
[im 620/1612]
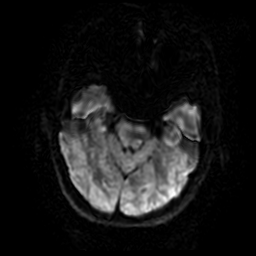
[im 744/1612]
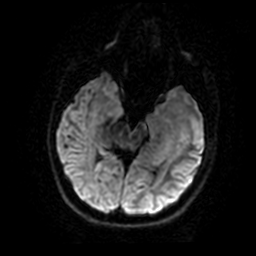
[im 868/1612]
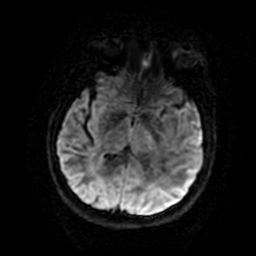
[im 992/1612]
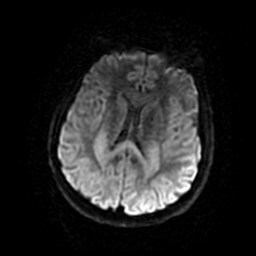
[im 1116/1612]
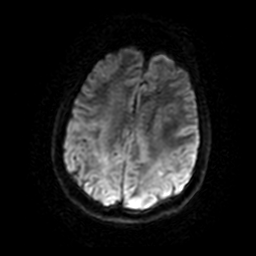
[im 1240/1612]
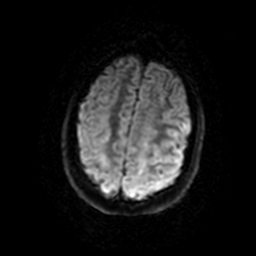
[im 1364/1612]
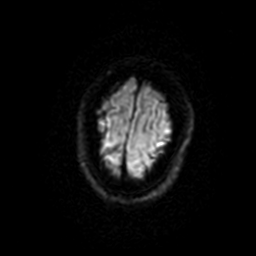
[im 1488/1612]
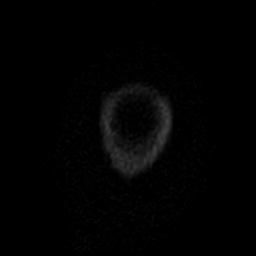
[im 1612/1612]
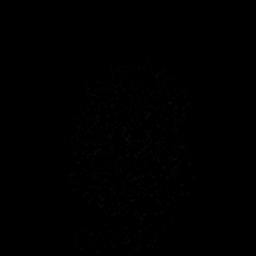

[Series 4: T2 · axial · 5.0mm · 0.43mm/px · 1 of 31 slices shown]
[im 1/31]
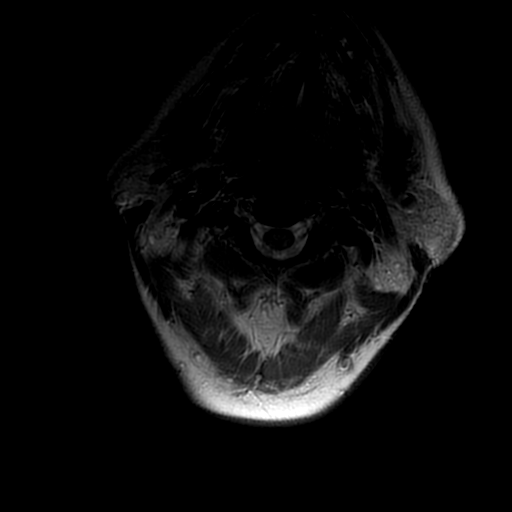

[Series 5: FLAIR · sagittal · 3.0mm · 0.47mm/px · 1 of 47 slices shown (1 of 2)]
[im 1/47]
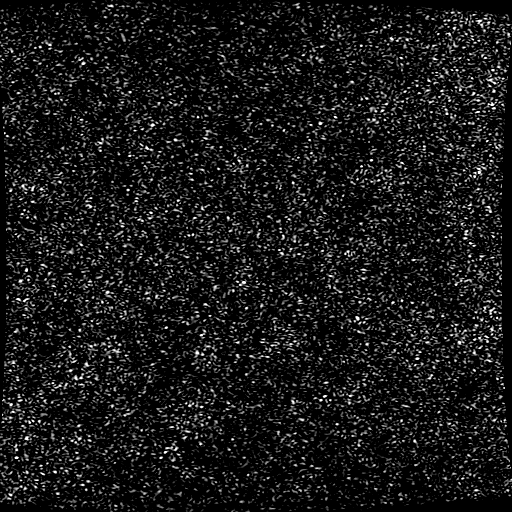

[Series 6: (person_name) · axial · 3.0mm · 0.47mm/px · 1 of 124 slices shown]
[im 1/124]
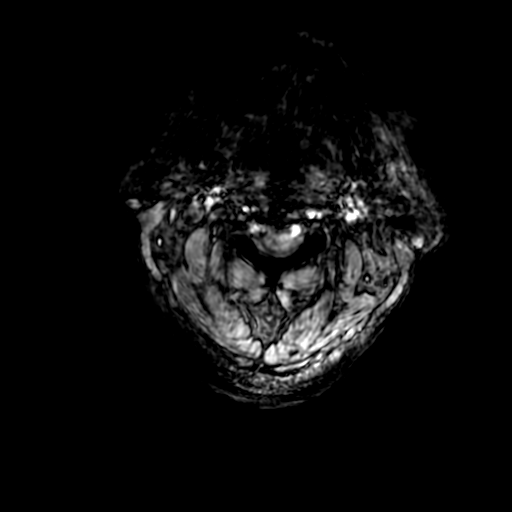

[Series 8: ax 3(person_name) · axial · 1.0mm · 1.02mm/px · 1 of 206 slices shown (1 of 2)]
[im 1/206]
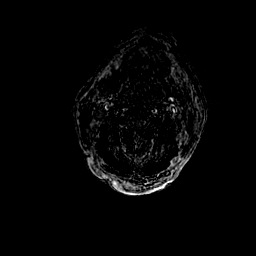

[Series 9: T2 post-contrast · coronal · 3.0mm · 0.43mm/px · 1 of 68 slices shown]
[im 1/68]
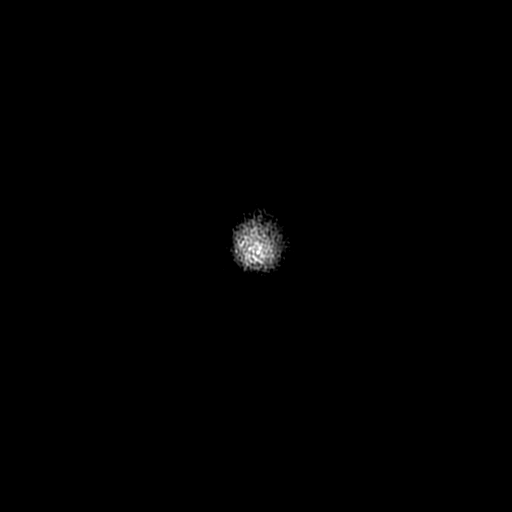

[Series 10: ax 3(person_name) · axial · 1.0mm · 1.02mm/px · 1 of 206 slices shown (2 of 2)]
[im 1/206]
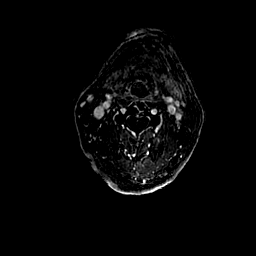

[Series 11: T1 · coronal · 5.0mm · 0.43mm/px · 1 of 34 slices shown]
[im 1/34]
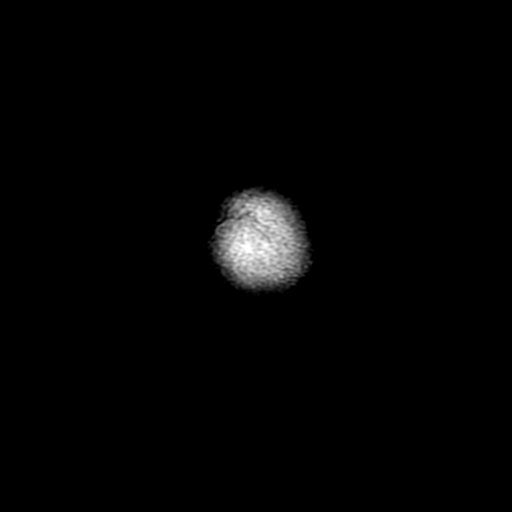

[Series 12: FLAIR · sagittal · 3.0mm · 0.47mm/px · 1 of 44 slices shown (2 of 2)]
[im 1/44]
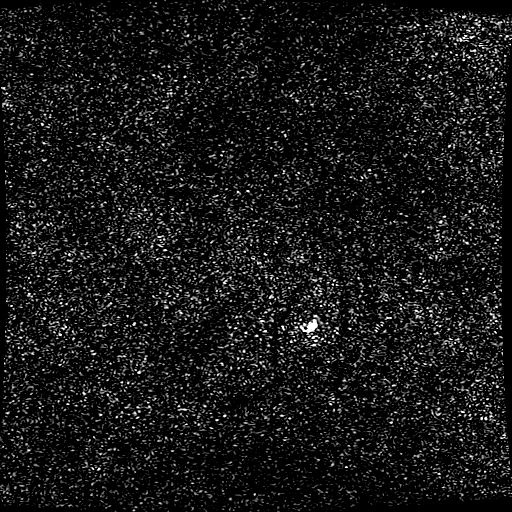

[Series 310: orig: ax dti · axial · 3.0mm · 0.94mm/px · z∈[-72,+111]mm · 8 of 1611 slices shown]
[im 1/1611]
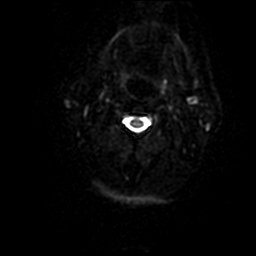
[im 231/1611]
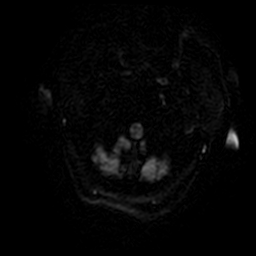
[im 461/1611]
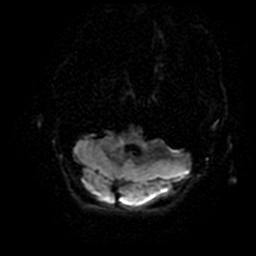
[im 691/1611]
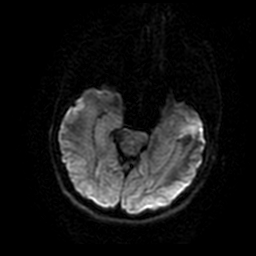
[im 921/1611]
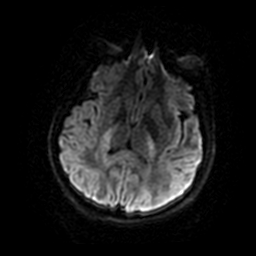
[im 1151/1611]
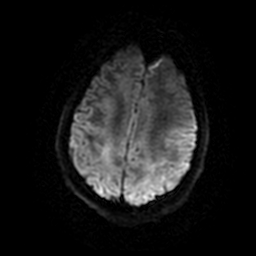
[im 1381/1611]
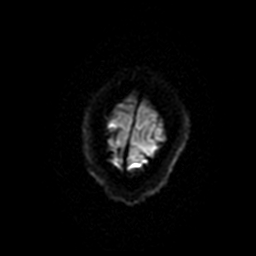
[im 1611/1611]
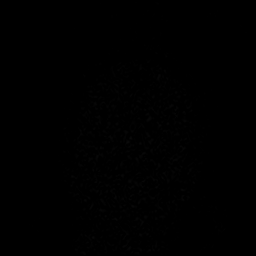

[Series 350: trace · axial · 3.0mm · 0.94mm/px · 1 of 60 slices shown]
[im 1/60]
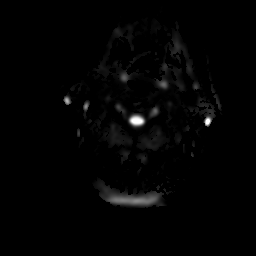

[Series 351: fa(no-q) · axial · 3.0mm · 0.94mm/px · 1 of 58 slices shown]
[im 1/58]
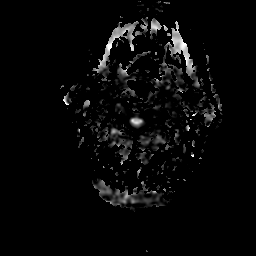

[Series 352: avdc (10^-6 mm²/s)(no-q) · axial · 3.0mm · 0.94mm/px · 1 of 60 slices shown]
[im 1/60]
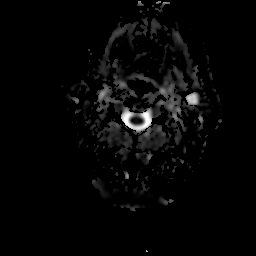

[Series 600: filt_pha: (person_name) · axial · 3.0mm · 0.47mm/px · 1 of 119 slices shown]
[im 1/119]
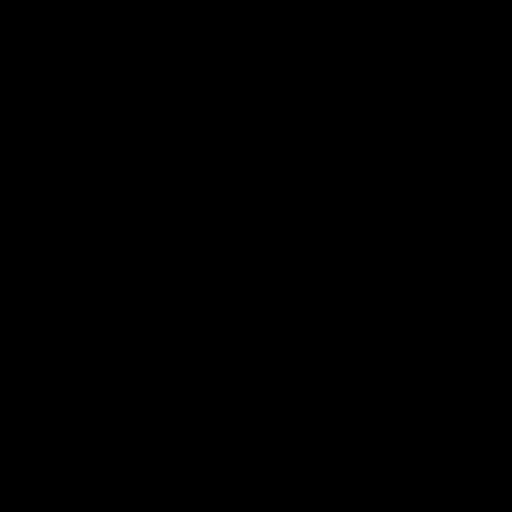

[Series 700: multiplanar reconstruction (mpr) · axial · 1.0mm · 0.50mm/px · z∈[-118,+137]mm · 2 of 256 slices shown (1 of 2)]
[im 1/256]
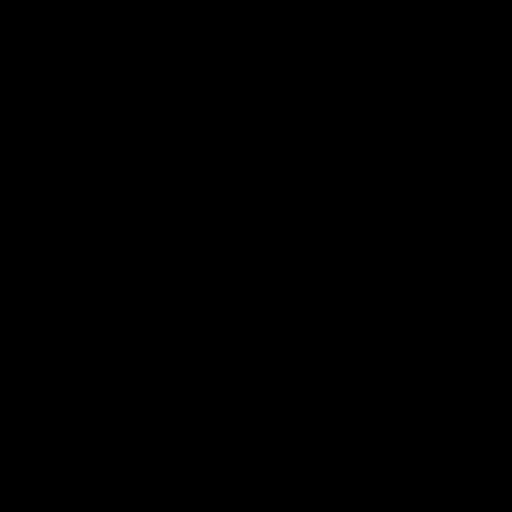
[im 256/256]
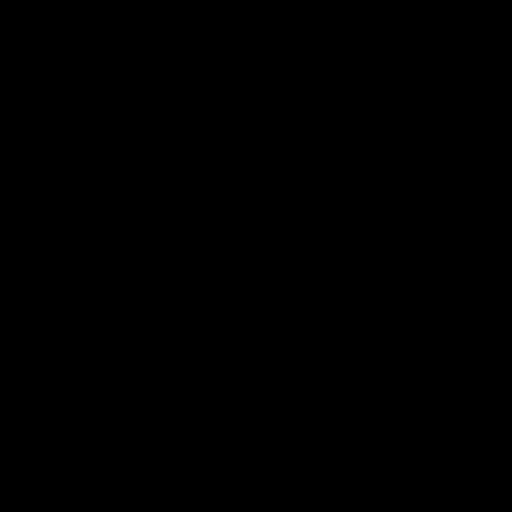

[Series 701: multiplanar reconstruction (mpr) · coronal · 1.0mm · 0.50mm/px · 2 of 243 slices shown (2 of 2)]
[im 1/243]
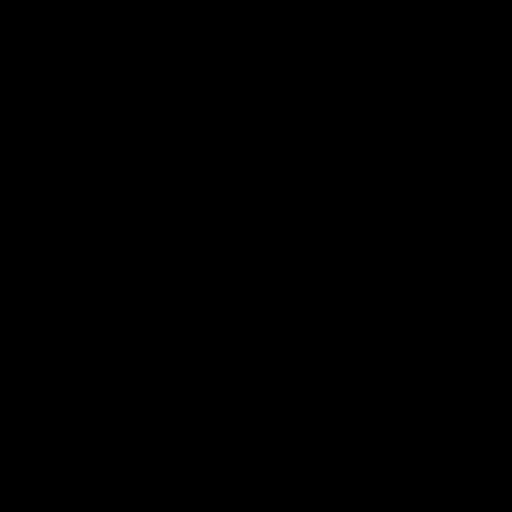
[im 243/243]
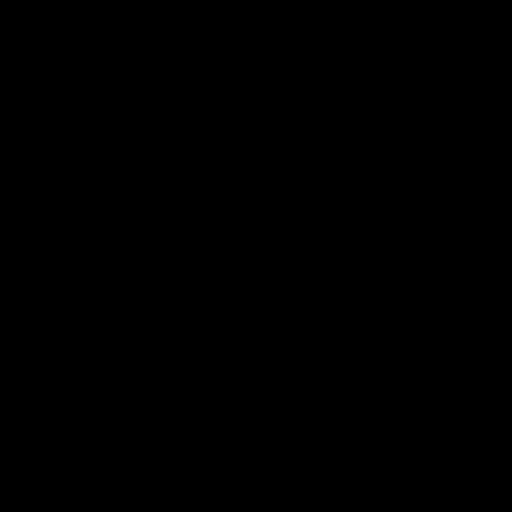

[38 of 48 positions shown; findings below may reference images not displayed]

FINDINGS: Brain: No new diffusion-weighted signal abnormality. Sequela of left
temporal biopsy track with chronic blood product deposition. No
ventriculomegaly, midline shift or extra-axial fluid collection.
Small focus of left basal ganglia SWI signal dropout.

T2/FLAIR hyperintense infiltrative left temporal mass is unchanged.
Abutment of the left midbrain is unchanged. Peripheral thin
enhancement along the biopsy tract. No additional foci of abnormal
enhancement.

Vascular: Normal flow voids.

Skull and upper cervical spine: Normal signal intensity.

Sinuses/Orbits: Normal orbits. Pneumatized paranasal sinuses and
mastoid air cells.

Other: None.
IMPRESSION: T2/FLAIR hyperintense infiltrative left temporal mass is unchanged.

Sequela of interval left temporal biopsy with minimal enhancement
along the tract.

No acute intracranial finding.

## 2020-07-22 MED ORDER — GADOBUTROL 1 MMOL/ML IV SOLN
7.5000 mL | Freq: Once | INTRAVENOUS | Status: AC | PRN
  Administered 2020-07-22: 7.5 mL via INTRAVENOUS

## 2020-07-24 ENCOUNTER — Other Ambulatory Visit: Payer: Self-pay

## 2020-07-24 ENCOUNTER — Encounter: Payer: Self-pay | Admitting: Internal Medicine

## 2020-07-24 ENCOUNTER — Other Ambulatory Visit: Payer: Self-pay | Admitting: Neurosurgery

## 2020-07-24 ENCOUNTER — Other Ambulatory Visit (HOSPITAL_COMMUNITY): Payer: Self-pay | Admitting: Neurosurgery

## 2020-07-24 ENCOUNTER — Inpatient Hospital Stay: Payer: Medicaid Other | Attending: Internal Medicine | Admitting: Internal Medicine

## 2020-07-24 VITALS — BP 119/68 | HR 67 | Temp 97.3°F | Resp 18 | Ht 65.0 in | Wt 148.3 lb

## 2020-07-24 DIAGNOSIS — C712 Malignant neoplasm of temporal lobe: Secondary | ICD-10-CM | POA: Diagnosis not present

## 2020-07-24 DIAGNOSIS — R400 Somnolence: Secondary | ICD-10-CM | POA: Diagnosis not present

## 2020-07-24 DIAGNOSIS — R569 Unspecified convulsions: Secondary | ICD-10-CM

## 2020-07-24 DIAGNOSIS — C719 Malignant neoplasm of brain, unspecified: Secondary | ICD-10-CM | POA: Diagnosis not present

## 2020-07-24 DIAGNOSIS — R42 Dizziness and giddiness: Secondary | ICD-10-CM | POA: Diagnosis not present

## 2020-07-24 DIAGNOSIS — Z79899 Other long term (current) drug therapy: Secondary | ICD-10-CM | POA: Insufficient documentation

## 2020-07-24 MED ORDER — CLONAZEPAM 2 MG PO TABS: 2.0000 mg | ORAL_TABLET | Freq: Two times a day (BID) | ORAL | 0 refills | Status: DC

## 2020-07-24 MED ORDER — LEVETIRACETAM 750 MG PO TABS: 750.0000 mg | ORAL_TABLET | Freq: Two times a day (BID) | ORAL | 0 refills | Status: DC

## 2020-07-24 MED ORDER — LACOSAMIDE 200 MG PO TABS: 100.0000 mg | ORAL_TABLET | Freq: Two times a day (BID) | ORAL | 0 refills | Status: DC

## 2020-07-24 MED ORDER — PHENYTOIN SODIUM EXTENDED 100 MG PO CAPS: 100.0000 mg | ORAL_CAPSULE | Freq: Two times a day (BID) | ORAL | 11 refills | Status: DC

## 2020-07-24 NOTE — Progress Notes (Signed)
[facility] [place], [place] [address] [phone]   New Patient Evaluation  Date of Service: 07/24/20 [name] Patient MRN: [id number] Patient DOB: 02-21-1961 Provider: Ventura Sellers, MD  Identifying Statement:  Hunter Terry is a 59 y.o. male with left temporal glioblastoma who presents for initial consultation and evaluation.    Referring Provider: No referring provider defined for this encounter.  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 [name]; path reveals glioblastoma IDH-wt     Biomarkers:  MGMT Unknown.  IDH 1/2 Wild type.  EGFR Unknown  TERT "Mutated   History of Present Illness: The patient's records from the referring physician were obtained and reviewed and the patient interviewed to confirm this HPI.  Hunter Terry presented to medical attention in late October, 2021 with new onset seizure.  Event was decribed as loss of consciousness, sudden, without clarity on further details aside from altered mental status upon awakening.  CNS imaging demonsrated non-enhancing mass within left temporal lobe c/w likely glioma; he underwent stereotactic biopsy with Dr. [name] on 06/15/20.  There was significant delay in finalizing path, explaining delay in follow up and evaluation.  He denies any seizures since discharge from hospital, on 4 anti-seizure drugs.  He does complain of dizziness and drowsiness with the medications, however.  Also describes impaired short term memory.  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    Medications: Current Outpatient Medications on File Prior to Visit  Medication Sig Dispense Refill  . clonazePAM (KLONOPIN) 2 MG tablet Take 1 tablet (2 mg total) by  mouth 3 (three) times daily. 90 tablet 0  . dexamethasone (DECADRON) 1 MG tablet 4 mg TID for 2 days, 2 mg TID for 2 days, 2 mg BID for 2 days, 1 mg BID for 2 days, 1 mg daily for 2 days. 50 tablet 0  . lacosamide (VIMPAT) 200 MG TABS tablet Take 1 tablet (200 mg total) by mouth 2 (two) times daily. 60 tablet 0  . levETIRAcetam (KEPPRA) 750 MG tablet Take 2 tablets (1,500 mg total) by mouth 2 (two) times daily. 120 tablet 0  . phenytoin (DILANTIN) 100 MG ER capsule Take 1 capsule (100 mg total) by mouth 3 (three) times daily. 90 capsule 11   No current facility-administered medications on file prior to visit.    Allergies: No Known Allergies Past Medical History: History reviewed. No pertinent past medical history. Past Surgical History:  Past Surgical History:  Procedure Laterality Date  . APPLICATION OF CRANIAL NAVIGATION N/A 06/15/2020   Procedure: APPLICATION OF CRANIAL NAVIGATION;  Surgeon: [name], MD;  Location: [facility];  Service: Neurosurgery;  Laterality: N/A;  . FRAMELESS  BIOPSY WITH BRAINLAB Left 06/15/2020   Procedure: Left temporal lobe stereotactic brain biopsy with brainlab;  Surgeon: [name], MD;  Location: [facility];  Service: Neurosurgery;  Laterality: Left;   Social History:  Social History   Socioeconomic History  . Marital status: Married    Spouse name: Not on file  . Number of children: Not on file  . Years of education: Not on file  . Highest education level: Not on file  Occupational History  . Not on file  Tobacco Use  . Smoking status: Never Smoker  . Smokeless  tobacco: Never Used  Substance and Sexual Activity  . Alcohol use: Never  . Drug use: Not on file  . Sexual activity: Not on file  Other Topics Concern  . Not on file  Social History Narrative  . Not on file   Social Determinants of Health   Financial Resource Strain: Not on file  Food Insecurity: Not on file  Transportation Needs: Not on file  Physical Activity: Not on  file  Stress: Not on file  Social Connections: Not on file  Intimate Partner Violence: Not on file   Family History:  Family History  Problem Relation Age of Onset  . Cancer Neg Hx     Review of Systems: Constitutional: Doesn't report fevers, chills or abnormal weight loss Eyes: Doesn't report blurriness of vision Ears, nose, mouth, throat, and face: Doesn't report sore throat Respiratory: Doesn't report cough, dyspnea or wheezes Cardiovascular: Doesn't report palpitation, chest discomfort  Gastrointestinal:  Doesn't report nausea, constipation, diarrhea GU: Doesn't report incontinence Skin: Doesn't report skin rashes Neurological: Per HPI Musculoskeletal: Doesn't report joint pain Behavioral/Psych: Doesn't report anxiety  Physical Exam: Vitals:   07/24/20 1022  BP: 119/68  Pulse: 67  Resp: 18  Temp: (!) 97.3 F (36.3 C)  SpO2: 100%   KPS: 90. General: Alert, cooperative, pleasant, in no acute distress Head: Normal EENT: No conjunctival injection or scleral icterus.  Lungs: Resp effort normal Cardiac: Regular rate Abdomen: Non-distended abdomen Skin: No rashes cyanosis or petechiae. Extremities: No clubbing or edema  Neurologic Exam: Mental Status: Awake, alert, attentive to examiner. Oriented to self and environment. Language is fluent with intact comprehension.  Cranial Nerves: Visual acuity is grossly normal. Visual fields are full. Extra-ocular movements intact. No ptosis. Face is symmetric Motor: Tone and bulk are normal. Power is full in both arms and legs. Reflexes are symmetric, no pathologic reflexes present.  Sensory: Intact to light touch Gait: Normal.   Labs: I have reviewed the data as listed    Component Value Date/Time   NA 136 06/16/2020 0115   K 4.2 06/16/2020 0115   CL 101 06/16/2020 0115   CO2 25 06/16/2020 0115   GLUCOSE 131 (H) 06/16/2020 0115   BUN 12 06/16/2020 0115   CREATININE 0.85 06/16/2020 0115   CALCIUM 8.9 06/16/2020 0115    PROT 7.7 06/09/2020 2055   ALBUMIN 3.6 06/15/2020 0140   AST 30 06/09/2020 2055   ALT 23 06/09/2020 2055   ALKPHOS 55 06/09/2020 2055   BILITOT 1.0 06/09/2020 2055   GFRNONAA >60 06/16/2020 0115   Lab Results  Component Value Date   WBC 7.7 06/16/2020   NEUTROABS 5.7 06/16/2020   HGB 14.3 06/16/2020   HCT 42.5 06/16/2020   MCV 83.8 06/16/2020   PLT 318 06/16/2020    Imaging:  MR BRAIN W WO CONTRAST  Result Date: 07/22/2020 CLINICAL DATA:  Glioma of brain EXAM: MRI HEAD WITHOUT AND WITH CONTRAST TECHNIQUE: Multiplanar, multiecho pulse sequences of the brain and surrounding structures were obtained without and with intravenous contrast. CONTRAST:  7.16mL GADAVIST GADOBUTROL 1 MMOL/ML IV SOLN COMPARISON:  06/14/2020 MRI and prior, 06/16/2020 CT FINDINGS: Brain: No new diffusion-weighted signal abnormality. Sequela of left temporal biopsy track with chronic blood product deposition. No ventriculomegaly, midline shift or extra-axial fluid collection. Small focus of left basal ganglia SWI signal dropout. T2/FLAIR hyperintense infiltrative left temporal mass is unchanged. Abutment of the left midbrain is unchanged. Peripheral thin enhancement along the biopsy tract. No additional foci of abnormal enhancement. Vascular:  Normal flow voids. Skull and upper cervical spine: Normal signal intensity. Sinuses/Orbits: Normal orbits. Pneumatized paranasal sinuses and mastoid air cells. Other: None. IMPRESSION: T2/FLAIR hyperintense infiltrative left temporal mass is unchanged. Sequela of interval left temporal biopsy with minimal enhancement along the tract. No acute intracranial finding. Electronically Signed   By: Primitivo Gauze M.D.   On: 07/22/2020 13:09    Pathology: SURGICAL PATHOLOGY  CASE: [id number]  PATIENT: Hunter Terry  Surgical Pathology Report   Clinical History: brain tumor (cm)   FINAL MICROSCOPIC DIAGNOSIS:   A. BRAIN, LEFT TEMPORAL LOBE, BIOPSY:  - Glioblastoma, see  comment.   COMMENT:   The case was sent for outside consultation to [facility] (Dr. Maisie Fus).  Please see full scanned report in EPIC.    GROSS DESCRIPTION:   Received in saline are 10 cores of tan-pink soft tissue ranging from 0.4  to 1.2 cm in length by 0.2 cm diameter, submitted in 2 cassettes. 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  His case is unique in that clinical and radiographic presentation is most consistent with low grade glioma, histology most c/w grade 3, and tumor genetics align with grade 4.  We discussed this extensively with him and his wife at bedside with Patent attorney.    Case was additionally discussed with Dr. [name] after receipt of finalized pathology; he will plan to take Mr. Heard back to operating room for maximal safe resection.  Fortunately most recent MRI did not demonstrate increase in extent of T2 signal abnormality or infiltration.  We are in agreement with this plan, as is the patient.  We discussed likely plans for radiation and chemotherapy following surgery.  This will be addressed in greater detail following craniotomy, re-analysis of pathology specimen.  He is overly medicated at this time with AEDs.  Because of inability to afford further Vimpat, will recommend first down-titrating that agent.  We will decreased to $RemoveBefo'100mg'hfBCTMSeuAa$  BID.  Dilantin is currently dosed at $Remove'100mg'cqrcxfD$  BID, and Keppra at $RemoveB'750mg'epMRldaP$  BID.  In addition, he has been taking Clonopin $RemoveBeforeD'2mg'tbOTYmvrjGPixE$  BID (not TID as reported). Once he has completed surgery we will discontinue Vimpat and begin to wean Klonopin.  We appreciate the opportunity to participate in the care of [place].  We will follow up closely with him.  Screening for potential clinical trials was performed and discussed using eligibility criteria for active protocols  at [facility], loco-regional tertiary centers, as well as national database available on [email or web address].com.    The patient is not a candidate for a research protocol at this time due to returning for craniotomy..   We spent twenty additional minutes teaching regarding the natural history, biology, and historical experience in the treatment of brain tumors. We then discussed in detail the current recommendations for therapy focusing on the mode of administration, mechanism of action, anticipated toxicities, and quality of life issues associated with this plan. We also provided teaching sheets for the patient to take home as an additional resource.  All questions were answered. The patient knows to call the clinic with any problems, questions or concerns. No barriers to learning were detected.  The total time spent in the encounter was 60 minutes and more than 50% was on counseling and review of test results   Ventura Sellers, MD Medical Director of Neuro-Oncology [facility] 07/24/20  10:36 AM

## 2020-07-26 ENCOUNTER — Telehealth: Payer: Self-pay | Admitting: Internal Medicine

## 2020-07-26 NOTE — Telephone Encounter (Signed)
Scheduled per 12/13 los, patient has been called and notified.

## 2020-07-27 ENCOUNTER — Inpatient Hospital Stay: Payer: Self-pay | Admitting: Physician Assistant

## 2020-07-28 NOTE — Pre-Procedure Instructions (Signed)
Joyce Leckey  07/28/2020      [place], [place], [place] [address] Phone: [phone] Fax: [phone]    Your procedure is scheduled on 08/01/20.  Report to [facility] Admitting at 530 A.M.  Call this number if you have problems the morning of surgery:  [phone]   Remember:  Do not eat or drink after midnight.     Take these medicines the morning of surgery with A SIP OF WATER ----KEPPRA.DILANTIN,KLONOPIN    Do not wear jewelry, make-up or nail polish.  Do not wear lotions, powders, or perfumes, or deodorant.  Do not shave 48 hours prior to surgery.  Men may shave face and neck.  Do not bring valuables to the hospital.  [facility] is not responsible for any belongings or valuables.  Contacts, dentures or bridgework may not be worn into surgery.  Leave your suitcase in the car.  After surgery it may be brought to your room.  For patients admitted to the hospital, discharge time will be determined by your treatment team.  Patients discharged the day of surgery will not be allowed to drive home.    Special instructions:  Do not take any aspirin,anti-inflammatories,vitamins,or herbal supplements 5-7 days prior to surgery.[facility] - Preparing for Surgery  Before surgery, you can play an important role.  Because skin is not sterile, your skin needs to be as free of germs as possible.  You can reduce the number of germs on you skin by washing with CHG (chlorahexidine gluconate) soap before surgery.  CHG is an antiseptic cleaner which kills germs and bonds with the skin to continue killing germs even after washing.  Oral Hygiene is also important in reducing the risk of infection.  Remember to brush your teeth with your regular toothpaste the morning of surgery.  Please DO NOT use if you have an allergy to CHG or antibacterial soaps.  If your skin becomes reddened/irritated stop  using the CHG and inform your nurse when you arrive at Short Stay.  Do not shave (including legs and underarms) for at least 48 hours prior to the first CHG shower.  You may shave your face.  Please follow these instructions carefully:   1.  Shower with CHG Soap the night before surgery and the morning of Surgery.  2.  If you choose to wash your hair, wash your hair first as usual with your normal shampoo.  3.  After you shampoo, rinse your hair and body thoroughly to remove the shampoo. 4.  Use CHG as you would any other liquid soap.  You can apply chg directly to the skin and wash gently with a      scrungie or washcloth.           5.  Apply the CHG Soap to your body ONLY FROM THE NECK DOWN.   Do not use on open wounds or open sores. Avoid contact with your eyes, ears, mouth and genitals (private parts).  Wash genitals (private parts) with your normal soap.  6.  Wash thoroughly, paying special attention to the area where your surgery will be performed.  7.  Thoroughly rinse your body with warm water from the neck down.  8.  DO NOT shower/wash with your normal soap after using and rinsing off the CHG Soap.  9.  Pat yourself dry with a clean towel.  10.  Wear clean pajamas.            11.  Place clean sheets on your bed the night of your first shower and do not sleep with pets.  Day of Surgery  Do not apply any lotions/deoderants the morning of surgery.   Please wear clean clothes to the hospital/surgery center. Remember to brush your teeth with toothpaste.    Please read over the following fact sheets that you were given. Coughing and Deep Breathing

## 2020-07-31 ENCOUNTER — Encounter (HOSPITAL_COMMUNITY)
Admission: RE | Admit: 2020-07-31 | Discharge: 2020-07-31 | Disposition: A | Discharge status: Home / Self Care | Payer: Medicaid Other | Source: Ambulatory Visit | Attending: Neurosurgery | Admitting: Neurosurgery

## 2020-07-31 ENCOUNTER — Other Ambulatory Visit (HOSPITAL_COMMUNITY)
Admission: RE | Admit: 2020-07-31 | Discharge: 2020-07-31 | Disposition: A | Discharge status: Home / Self Care | Payer: Medicaid Other | Source: Ambulatory Visit | Attending: Neurosurgery | Admitting: Neurosurgery

## 2020-07-31 ENCOUNTER — Encounter (HOSPITAL_COMMUNITY): Payer: Self-pay

## 2020-07-31 ENCOUNTER — Other Ambulatory Visit: Payer: Self-pay

## 2020-07-31 DIAGNOSIS — Z01812 Encounter for preprocedural laboratory examination: Secondary | ICD-10-CM | POA: Insufficient documentation

## 2020-07-31 DIAGNOSIS — Z20822 Contact with and (suspected) exposure to covid-19: Secondary | ICD-10-CM | POA: Insufficient documentation

## 2020-07-31 HISTORY — DX: Unspecified convulsions: R56.9

## 2020-07-31 HISTORY — DX: Malignant (primary) neoplasm, unspecified: C80.1

## 2020-07-31 HISTORY — DX: Headache, unspecified: R51.9

## 2020-07-31 LAB — BASIC METABOLIC PANEL
Anion gap: 11 (ref 5–15)
BUN: 7 mg/dL (ref 6–20)
CO2: 27 mmol/L (ref 22–32)
Calcium: 9.3 mg/dL (ref 8.9–10.3)
Chloride: 101 mmol/L (ref 98–111)
Creatinine, Ser: 0.69 mg/dL (ref 0.61–1.24)
GFR, Estimated: >60 mL/min (ref >60)
Glucose, Bld: 105 mg/dL — ABNORMAL HIGH (ref 70–99)
Potassium: 4 mmol/L (ref 3.5–5.1)
Sodium: 139 mmol/L (ref 135–145)

## 2020-07-31 LAB — CBC
HCT: 44.5 % (ref 39.0–52.0)
Hemoglobin: 15 g/dL (ref 13.0–17.0)
MCH: 28.5 pg (ref 26.0–34.0)
MCHC: 33.7 g/dL (ref 30.0–36.0)
MCV: 84.6 fL (ref 80.0–100.0)
Platelets: 350 10*3/uL (ref 150–400)
RBC: 5.26 MIL/uL (ref 4.22–5.81)
RDW: 13.6 % (ref 11.5–15.5)
WBC: 5.3 10*3/uL (ref 4.0–10.5)
nRBC: 0 % (ref 0.0–0.2)

## 2020-07-31 LAB — NO BLOOD PRODUCTS

## 2020-07-31 NOTE — Progress Notes (Signed)
PCP - NA      PT WENT TO ED AND WAS REFERRED TO DR [name] Cardiologist - NA      Chest x-ray - NA EKG - 11/21 Stress Test - NA ECHO - NA Cardiac Cath -NA    Aspirin Instructions:STOP      -   COVID TEST- GOING TODAY 07/31/20     Anesthesia review: PT REFUSES BLOOD.I CALLED DR Redford.ALSO SPEAKS SPANISH WITH INTERPRETER  Patient denies shortness of breath, fever, cough and chest pain at PAT appointment   All instructions explained to the patient, with a verbal understanding of the material. Patient agrees to go over the instructions while at home for a better understanding. Patient also instructed to self quarantine after being tested for COVID-19. The opportunity to ask questions was provided.

## 2020-08-01 ENCOUNTER — Other Ambulatory Visit: Payer: Self-pay | Admitting: Neurosurgery

## 2020-08-01 LAB — SARS CORONAVIRUS 2 (TAT 6-24 HRS): SARS Coronavirus 2: NEGATIVE

## 2020-08-01 NOTE — Anesthesia Preprocedure Evaluation (Addendum)
Anesthesia Evaluation  Patient identified by MRN, date of birth, ID band Patient awake    Reviewed: Allergy & Precautions, NPO status , Patient's Chart, lab work & pertinent test results  Airway Mallampati: II  TM Distance: >3 FB Neck ROM: Full    Dental  (+) Teeth Intact   Pulmonary neg pulmonary ROS,    Pulmonary exam normal        Cardiovascular negative cardio ROS   Rhythm:Regular Rate:Normal     Neuro/Psych  Headaches, Seizures -, Well Controlled,  Left temporal GBM dx 11/21 negative psych ROS   GI/Hepatic negative GI ROS, Neg liver ROS,   Endo/Other  negative endocrine ROS  Renal/GU negative Renal ROS  negative genitourinary   Musculoskeletal negative musculoskeletal ROS (+)   Abdominal (+)  Abdomen: soft. Bowel sounds: normal.  Peds  Hematology negative hematology ROS (+)   Anesthesia Other Findings   Reproductive/Obstetrics                            Anesthesia Physical Anesthesia Plan  ASA: IV  Anesthesia Plan: General   Post-op Pain Management:    Induction: Intravenous  PONV Risk Score and Plan: 2 and Ondansetron, Dexamethasone, Propofol infusion and Treatment may vary due to age or medical condition  Airway Management Planned: Mask and Oral ETT  Additional Equipment: Arterial line  Intra-op Plan:   Post-operative Plan: Possible Post-op intubation/ventilation  Informed Consent: I have reviewed the patients History and Physical, chart, labs and discussed the procedure including the risks, benefits and alternatives for the proposed anesthesia with the patient or authorized representative who has indicated his/her understanding and acceptance.     Dental advisory given  Plan Discussed with: CRNA  Anesthesia Plan Comments: (PAT note written 08/01/2020 by Myra Gianotti, PA-C. He refuses blood transfusion. 07/31/20 CBC WNL. Lab Results      Component                 Value               Date                      WBC                      5.3                 07/31/2020                HGB                      15.0                07/31/2020                HCT                      44.5                07/31/2020                MCV                      84.6                07/31/2020                PLT  350                 07/31/2020           Lab Results      Component                Value               Date                      NA                       139                 07/31/2020                K                        4.0                 07/31/2020                CO2                      27                  07/31/2020                GLUCOSE                  105 (H)             07/31/2020                BUN                      7                   07/31/2020                CREATININE               0.69                07/31/2020                CALCIUM                  9.3                 07/31/2020                GFRNONAA                 >60                 07/31/2020           )       Anesthesia Quick Evaluation

## 2020-08-01 NOTE — Progress Notes (Signed)
Anesthesia Chart Review:  Case: [id number] Date/Time: 08/02/20 0715   Procedures:      CRANIOTOMY LEFT TEMPORAL LOBECTOMY FOR TUMOR EXCISION (N/A )     APPLICATION OF CRANIAL NAVIGATION (N/A )   Anesthesia type: General   Pre-op diagnosis: GLIOMA OF BRAIN   Location: [facility] OR ROOM 21 / [facility] OR   Surgeons: [name], MD      DISCUSSION: Patient is a 59 year old male scheduled for the above procedure. Primary language is Spanish (interpretor arranged per PAT notes).  He was admitted on 06/09/20 for altered mental status which was noticed when he was missing his [employer] deliveries. His supervisor was able to arrange a nurse to communicate with him via work camera, but he was not obeying commands and truck had to be remotely disabled and EMS contacted.  He had had previous evaluation through [facility] for possible seizure in May 2021 with MRI head showing abnormal infiltrative and mass-like left temporal lobe lesion with normal MRA head/neck. Patient did have follow through with follow-up. Repeat imaging showed persistent infiltrative left temporal lesion with some expansion. Neurology and Neurosurgery consulted. EEG showed 3 left hemispheric seizures and was started on anti-seizure medications and prednisone. Nephrology consulted for hyponatremia. He underwent left temporal lobe stereotactic biopsy on 06/15/20 with pathology showing glioblastoma. He was discharged home on 06/16/20 with out-patient follow-up with neurosurgery and neuro-oncology. Left frontal lobectomy is planned with likely post-operative chemoradiation.   History includes never smoker, brain tumor with secondary seiziures.   Preoperative COVID-19 test negative on 07/31/2020.  Anesthesia team to evaluate on the day of surgery. Of note, CBC is WNL (H/H 15.0/44.5)--he has refused blood transfusion if needed--PAT RN notation indicates that she informed the surgeon.    VS: BP 121/74   Pulse 72   Temp 36.6 C (Oral)   Resp 18   Ht 5\' 5"   (1.651 m)   Wt 68.7 kg   SpO2 100%   BMI 25.21 kg/m    PROVIDERS: Patient, No Pcp Per Vaslow-Zachary, MD is NEURO-ONC   LABS: Labs reviewed: Acceptable for surgery. (all labs ordered are listed, but only abnormal results are displayed)  Labs Reviewed  BASIC METABOLIC PANEL - Abnormal; Notable for the following components:      Result Value   Glucose, Bld 105 (*)    All other components within normal limits  CBC  NO BLOOD PRODUCTS    OTHER: Overnight EEG with video 06/11/20: IMPRESSION: This study showed seizures without clear clinical signs, avg 5/hour, lasting 1-1.5 minutes, arising from left frontotemporal region. There is also cortical dysfunction in left frontotemporal region consistent with underlying mass.    EEG 06/10/20: EEG Abnormalities: 1) Three focal left hemispheric seizures. As above.  2) Left frontotemporal seizures - Clinical Interpretation: This EEG recorded three discrete focal seizures arising from the left hemisphere with no definite clinical correlate.   IMAGES: MRI Brain 07/22/20: IMPRESSION: - T2/FLAIR hyperintense infiltrative left temporal mass is unchanged. - Sequela of interval left temporal biopsy with minimal enhancement along the tract. - No acute intracranial finding.   EKG: 06/09/20: Sinus tachycardia at 136 bpm Minimal ST depression, diffuse leads agree. Confirmed by Charlesetta Shanks [phone]) on 06/10/2020 1:35:05 AM   CV: N/A   Past Medical History:  Diagnosis Date  . Cancer ([place])    BRAIN TUMOR  . Headache   . Seizure [facility])     Past Surgical History:  Procedure Laterality Date  . APPLICATION OF CRANIAL NAVIGATION N/A  06/15/2020   Procedure: APPLICATION OF CRANIAL NAVIGATION;  Surgeon: [name], MD;  Location: [facility];  Service: Neurosurgery;  Laterality: N/A;  . FRAMELESS  BIOPSY WITH BRAINLAB Left 06/15/2020   Procedure: Left temporal lobe stereotactic brain biopsy with brainlab;  Surgeon: [name],  MD;  Location: [facility];  Service: Neurosurgery;  Laterality: Left;    MEDICATIONS: . Carboxymethylcellul-Glycerin (LUBRICATING EYE DROPS OP)  . clonazePAM (KLONOPIN) 2 MG tablet  . lacosamide (VIMPAT) 200 MG TABS tablet  . levETIRAcetam (KEPPRA) 750 MG tablet  . oxymetazoline (AFRIN) 0.05 % nasal spray  . phenytoin (DILANTIN) 100 MG ER capsule   No current facility-administered medications for this encounter.   Myra Gianotti, PA-C Surgical Short Stay/Anesthesiology [facility] Phone [phone] [facility] Phone [phone] 08/01/2020 10:30 AM

## 2020-08-02 ENCOUNTER — Inpatient Hospital Stay (HOSPITAL_COMMUNITY): Payer: Medicaid Other | Admitting: Anesthesiology

## 2020-08-02 ENCOUNTER — Other Ambulatory Visit: Payer: Self-pay

## 2020-08-02 ENCOUNTER — Inpatient Hospital Stay (HOSPITAL_COMMUNITY)
Admission: RE | Admit: 2020-08-02 | Discharge: 2020-08-04 | DRG: 027 | Disposition: A | Discharge status: Home / Self Care | Payer: Medicaid Other | Attending: Neurosurgery | Admitting: Neurosurgery

## 2020-08-02 ENCOUNTER — Inpatient Hospital Stay (HOSPITAL_COMMUNITY)
Admission: RE | Disposition: A | Discharge status: Home / Self Care | Payer: Self-pay | Source: Home / Self Care | Attending: Neurosurgery

## 2020-08-02 ENCOUNTER — Inpatient Hospital Stay (HOSPITAL_COMMUNITY): Payer: Medicaid Other | Admitting: Vascular Surgery

## 2020-08-02 ENCOUNTER — Encounter (HOSPITAL_COMMUNITY): Payer: Self-pay

## 2020-08-02 DIAGNOSIS — C719 Malignant neoplasm of brain, unspecified: Secondary | ICD-10-CM | POA: Diagnosis present

## 2020-08-02 DIAGNOSIS — G40901 Epilepsy, unspecified, not intractable, with status epilepticus: Secondary | ICD-10-CM | POA: Diagnosis not present

## 2020-08-02 DIAGNOSIS — C712 Malignant neoplasm of temporal lobe: Secondary | ICD-10-CM | POA: Diagnosis not present

## 2020-08-02 DIAGNOSIS — Z20822 Contact with and (suspected) exposure to covid-19: Secondary | ICD-10-CM | POA: Diagnosis present

## 2020-08-02 DIAGNOSIS — Z79899 Other long term (current) drug therapy: Secondary | ICD-10-CM | POA: Diagnosis not present

## 2020-08-02 HISTORY — PX: APPLICATION OF CRANIAL NAVIGATION: SHX6578

## 2020-08-02 HISTORY — PX: CRANIOTOMY: SHX93

## 2020-08-02 LAB — MRSA PCR SCREENING: MRSA by PCR: NEGATIVE

## 2020-08-02 SURGERY — CRANIOTOMY TUMOR EXCISION
Anesthesia: General | Site: Head

## 2020-08-02 MED ORDER — FENTANYL CITRATE (PF) 250 MCG/5ML IJ SOLN
INTRAMUSCULAR | Status: AC
  Filled 2020-08-02: qty 5

## 2020-08-02 MED ORDER — CEFAZOLIN SODIUM-DEXTROSE 2-4 GM/100ML-% IV SOLN
INTRAVENOUS | Status: AC
  Filled 2020-08-02: qty 100

## 2020-08-02 MED ORDER — SODIUM CHLORIDE 0.9 % IV SOLN
INTRAVENOUS | Status: DC | PRN
  Administered 2020-08-02: 500 mg via INTRAVENOUS

## 2020-08-02 MED ORDER — PROMETHAZINE HCL 25 MG PO TABS
12.5000 mg | ORAL_TABLET | ORAL | Status: DC | PRN
  Filled 2020-08-02: qty 1

## 2020-08-02 MED ORDER — ONDANSETRON HCL 4 MG/2ML IJ SOLN
INTRAMUSCULAR | Status: DC | PRN
  Administered 2020-08-02 (×2): 4 mg via INTRAVENOUS

## 2020-08-02 MED ORDER — THROMBIN 5000 UNITS EX SOLR
CUTANEOUS | Status: AC
Start: 2020-08-02 — End: ?
  Filled 2020-08-02: qty 5000

## 2020-08-02 MED ORDER — DOCUSATE SODIUM 100 MG PO CAPS
100.0000 mg | ORAL_CAPSULE | Freq: Two times a day (BID) | ORAL | Status: DC
  Administered 2020-08-02 – 2020-08-04 (×4): 100 mg via ORAL
  Filled 2020-08-02 (×4): qty 1

## 2020-08-02 MED ORDER — PHENYLEPHRINE 40 MCG/ML (10ML) SYRINGE FOR IV PUSH (FOR BLOOD PRESSURE SUPPORT)
PREFILLED_SYRINGE | INTRAVENOUS | Status: AC
  Filled 2020-08-02: qty 20

## 2020-08-02 MED ORDER — PHENYTOIN SODIUM EXTENDED 100 MG PO CAPS
100.0000 mg | ORAL_CAPSULE | Freq: Two times a day (BID) | ORAL | Status: DC
  Administered 2020-08-02 – 2020-08-04 (×4): 100 mg via ORAL
  Filled 2020-08-02 (×5): qty 1

## 2020-08-02 MED ORDER — ESMOLOL HCL 100 MG/10ML IV SOLN
INTRAVENOUS | Status: DC | PRN
  Administered 2020-08-02: 20 mg via INTRAVENOUS
  Administered 2020-08-02: 30 mg via INTRAVENOUS

## 2020-08-02 MED ORDER — ENOXAPARIN SODIUM 40 MG/0.4ML ~~LOC~~ SOLN
40.0000 mg | SUBCUTANEOUS | Status: DC
  Administered 2020-08-03: 40 mg via SUBCUTANEOUS
  Filled 2020-08-02: qty 0.4

## 2020-08-02 MED ORDER — POTASSIUM CHLORIDE IN NACL 20-0.9 MEQ/L-% IV SOLN
INTRAVENOUS | Status: DC
  Filled 2020-08-02 (×2): qty 1000

## 2020-08-02 MED ORDER — HYDROMORPHONE HCL 1 MG/ML IJ SOLN
0.2500 mg | INTRAMUSCULAR | Status: DC | PRN
  Administered 2020-08-02: 0.5 mg via INTRAVENOUS

## 2020-08-02 MED ORDER — CARBOXYMETHYLCELLUL-GLYCERIN 0.5-0.9 % OP SOLN: 1.0000 [drp] | Freq: Every day | OPHTHALMIC | Status: DC | PRN

## 2020-08-02 MED ORDER — NICARDIPINE HCL IN NACL 20-0.86 MG/200ML-% IV SOLN
3.0000 mg/h | INTRAVENOUS | Status: DC
  Administered 2020-08-02: 5 mg/h via INTRAVENOUS
  Administered 2020-08-02: 2.5 mg/h via INTRAVENOUS
  Filled 2020-08-02 (×2): qty 200

## 2020-08-02 MED ORDER — HYDROMORPHONE HCL 1 MG/ML IJ SOLN
INTRAMUSCULAR | Status: AC
  Filled 2020-08-02: qty 1

## 2020-08-02 MED ORDER — MIDAZOLAM HCL 5 MG/5ML IJ SOLN
INTRAMUSCULAR | Status: DC | PRN
  Administered 2020-08-02: 2 mg via INTRAVENOUS

## 2020-08-02 MED ORDER — THROMBIN 20000 UNITS EX SOLR
CUTANEOUS | Status: AC
  Filled 2020-08-02: qty 20000

## 2020-08-02 MED ORDER — CHLORHEXIDINE GLUCONATE 0.12 % MT SOLN: 15.0000 mL | Freq: Once | OROMUCOSAL | Status: AC

## 2020-08-02 MED ORDER — LIDOCAINE 2% (20 MG/ML) 5 ML SYRINGE
INTRAMUSCULAR | Status: DC | PRN
  Administered 2020-08-02: 60 mg via INTRAVENOUS

## 2020-08-02 MED ORDER — LIDOCAINE-EPINEPHRINE 1 %-1:100000 IJ SOLN
INTRAMUSCULAR | Status: DC | PRN
  Administered 2020-08-02: 9 mL

## 2020-08-02 MED ORDER — THROMBIN 20000 UNITS EX SOLR: CUTANEOUS | Status: DC | PRN

## 2020-08-02 MED ORDER — MORPHINE SULFATE (PF) 2 MG/ML IV SOLN: 1.0000 mg | INTRAVENOUS | Status: DC | PRN

## 2020-08-02 MED ORDER — POLYETHYLENE GLYCOL 3350 17 G PO PACK: 17.0000 g | PACK | Freq: Every day | ORAL | Status: DC | PRN

## 2020-08-02 MED ORDER — LIDOCAINE-EPINEPHRINE 1 %-1:100000 IJ SOLN
INTRAMUSCULAR | Status: AC
  Filled 2020-08-02: qty 1

## 2020-08-02 MED ORDER — PROPOFOL 10 MG/ML IV BOLUS
INTRAVENOUS | Status: AC
  Filled 2020-08-02: qty 20

## 2020-08-02 MED ORDER — PHENYLEPHRINE HCL-NACL 10-0.9 MG/250ML-% IV SOLN
INTRAVENOUS | Status: DC | PRN
  Administered 2020-08-02: 40 ug/min via INTRAVENOUS
  Administered 2020-08-02: 20 ug/min via INTRAVENOUS

## 2020-08-02 MED ORDER — FENTANYL CITRATE (PF) 100 MCG/2ML IJ SOLN
INTRAMUSCULAR | Status: DC | PRN
  Administered 2020-08-02: 25 ug via INTRAVENOUS
  Administered 2020-08-02: 50 ug via INTRAVENOUS
  Administered 2020-08-02: 25 ug via INTRAVENOUS
  Administered 2020-08-02: 100 ug via INTRAVENOUS

## 2020-08-02 MED ORDER — HYDROCODONE-ACETAMINOPHEN 5-325 MG PO TABS
1.0000 | ORAL_TABLET | ORAL | Status: DC | PRN
  Administered 2020-08-02 – 2020-08-03 (×4): 1 via ORAL
  Filled 2020-08-02 (×4): qty 1

## 2020-08-02 MED ORDER — THROMBIN 5000 UNITS EX SOLR: OROMUCOSAL | Status: DC | PRN

## 2020-08-02 MED ORDER — ROCURONIUM BROMIDE 10 MG/ML (PF) SYRINGE
PREFILLED_SYRINGE | INTRAVENOUS | Status: AC
  Filled 2020-08-02: qty 10

## 2020-08-02 MED ORDER — BACITRACIN ZINC 500 UNIT/GM EX OINT
TOPICAL_OINTMENT | CUTANEOUS | Status: DC | PRN
  Administered 2020-08-02: 1 via TOPICAL

## 2020-08-02 MED ORDER — ONDANSETRON HCL 4 MG/2ML IJ SOLN
INTRAMUSCULAR | Status: AC
Start: 2020-08-02 — End: ?
  Filled 2020-08-02: qty 2

## 2020-08-02 MED ORDER — SODIUM CHLORIDE 0.9 % IV SOLN: INTRAVENOUS | Status: DC | PRN

## 2020-08-02 MED ORDER — CEFAZOLIN SODIUM 1 G IJ SOLR
INTRAMUSCULAR | Status: AC
  Filled 2020-08-02: qty 20

## 2020-08-02 MED ORDER — DEXAMETHASONE SODIUM PHOSPHATE 10 MG/ML IJ SOLN
INTRAMUSCULAR | Status: DC | PRN
  Administered 2020-08-02: 10 mg via INTRAVENOUS

## 2020-08-02 MED ORDER — OXYMETAZOLINE HCL 0.05 % NA SOLN: 1.0000 | Freq: Two times a day (BID) | NASAL | Status: DC | PRN

## 2020-08-02 MED ORDER — ONDANSETRON HCL 4 MG/2ML IJ SOLN: 4.0000 mg | INTRAMUSCULAR | Status: DC | PRN

## 2020-08-02 MED ORDER — CHLORHEXIDINE GLUCONATE 0.12 % MT SOLN
OROMUCOSAL | Status: AC
  Administered 2020-08-02: 15 mL via OROMUCOSAL
  Filled 2020-08-02: qty 15

## 2020-08-02 MED ORDER — CEFAZOLIN SODIUM-DEXTROSE 2-3 GM-%(50ML) IV SOLR
INTRAVENOUS | Status: DC | PRN
  Administered 2020-08-02 (×2): 2 g via INTRAVENOUS

## 2020-08-02 MED ORDER — CHLORHEXIDINE GLUCONATE CLOTH 2 % EX PADS
6.0000 | MEDICATED_PAD | Freq: Every day | CUTANEOUS | Status: DC
  Administered 2020-08-03 – 2020-08-04 (×2): 6 via TOPICAL

## 2020-08-02 MED ORDER — ONDANSETRON HCL 4 MG/2ML IJ SOLN: 4.0000 mg | Freq: Once | INTRAMUSCULAR | Status: DC | PRN

## 2020-08-02 MED ORDER — LEVETIRACETAM IN NACL 500 MG/100ML IV SOLN
500.0000 mg | INTRAVENOUS | Status: DC
  Filled 2020-08-02: qty 100

## 2020-08-02 MED ORDER — LIDOCAINE 2% (20 MG/ML) 5 ML SYRINGE
INTRAMUSCULAR | Status: AC
  Filled 2020-08-02: qty 5

## 2020-08-02 MED ORDER — EPHEDRINE SULFATE-NACL 50-0.9 MG/10ML-% IV SOSY
PREFILLED_SYRINGE | INTRAVENOUS | Status: DC | PRN
  Administered 2020-08-02: 10 mg via INTRAVENOUS
  Administered 2020-08-02 (×2): 5 mg via INTRAVENOUS

## 2020-08-02 MED ORDER — ACETAMINOPHEN 650 MG RE SUPP: 650.0000 mg | RECTAL | Status: DC | PRN

## 2020-08-02 MED ORDER — ONDANSETRON HCL 4 MG PO TABS: 4.0000 mg | ORAL_TABLET | ORAL | Status: DC | PRN

## 2020-08-02 MED ORDER — PHENYLEPHRINE 40 MCG/ML (10ML) SYRINGE FOR IV PUSH (FOR BLOOD PRESSURE SUPPORT)
PREFILLED_SYRINGE | INTRAVENOUS | Status: DC | PRN
  Administered 2020-08-02 (×2): 80 ug via INTRAVENOUS

## 2020-08-02 MED ORDER — ACETAMINOPHEN 325 MG PO TABS: 650.0000 mg | ORAL_TABLET | ORAL | Status: DC | PRN

## 2020-08-02 MED ORDER — FLEET ENEMA 7-19 GM/118ML RE ENEM: 1.0000 | ENEMA | Freq: Once | RECTAL | Status: DC | PRN

## 2020-08-02 MED ORDER — CLONAZEPAM 0.5 MG PO TABS
2.0000 mg | ORAL_TABLET | Freq: Two times a day (BID) | ORAL | Status: DC
  Administered 2020-08-02 – 2020-08-04 (×4): 2 mg via ORAL
  Filled 2020-08-02 (×3): qty 2
  Filled 2020-08-02: qty 4

## 2020-08-02 MED ORDER — DIPHENHYDRAMINE HCL 50 MG/ML IJ SOLN
INTRAMUSCULAR | Status: DC | PRN
  Administered 2020-08-02: 12.5 mg via INTRAVENOUS

## 2020-08-02 MED ORDER — LEVETIRACETAM 750 MG PO TABS
750.0000 mg | ORAL_TABLET | Freq: Two times a day (BID) | ORAL | Status: DC
  Administered 2020-08-02 – 2020-08-04 (×4): 750 mg via ORAL
  Filled 2020-08-02 (×5): qty 1

## 2020-08-02 MED ORDER — LACOSAMIDE 50 MG PO TABS
100.0000 mg | ORAL_TABLET | Freq: Two times a day (BID) | ORAL | Status: DC
  Administered 2020-08-02 – 2020-08-04 (×4): 100 mg via ORAL
  Filled 2020-08-02 (×4): qty 2

## 2020-08-02 MED ORDER — SENNA 8.6 MG PO TABS
1.0000 | ORAL_TABLET | Freq: Two times a day (BID) | ORAL | Status: DC
  Administered 2020-08-02 – 2020-08-04 (×4): 8.6 mg via ORAL
  Filled 2020-08-02 (×4): qty 1

## 2020-08-02 MED ORDER — THROMBIN 5000 UNITS EX SOLR
CUTANEOUS | Status: AC
  Filled 2020-08-02: qty 5000

## 2020-08-02 MED ORDER — CEFAZOLIN SODIUM-DEXTROSE 1-4 GM/50ML-% IV SOLN
1.0000 g | Freq: Three times a day (TID) | INTRAVENOUS | Status: AC
  Administered 2020-08-02 – 2020-08-03 (×3): 1 g via INTRAVENOUS
  Filled 2020-08-02 (×3): qty 50

## 2020-08-02 MED ORDER — ROCURONIUM BROMIDE 100 MG/10ML IV SOLN
INTRAVENOUS | Status: DC | PRN
  Administered 2020-08-02: 30 mg via INTRAVENOUS
  Administered 2020-08-02: 40 mg via INTRAVENOUS
  Administered 2020-08-02: 60 mg via INTRAVENOUS
  Administered 2020-08-02: 30 mg via INTRAVENOUS
  Administered 2020-08-02: 20 mg via INTRAVENOUS

## 2020-08-02 MED ORDER — LACTATED RINGERS IV SOLN: INTRAVENOUS | Status: DC

## 2020-08-02 MED ORDER — PANTOPRAZOLE SODIUM 40 MG PO TBEC
40.0000 mg | DELAYED_RELEASE_TABLET | Freq: Every day | ORAL | Status: DC
  Administered 2020-08-02 – 2020-08-04 (×3): 40 mg via ORAL
  Filled 2020-08-02 (×3): qty 1

## 2020-08-02 MED ORDER — SUGAMMADEX SODIUM 200 MG/2ML IV SOLN
INTRAVENOUS | Status: DC | PRN
  Administered 2020-08-02: 125 mg via INTRAVENOUS

## 2020-08-02 MED ORDER — PROPOFOL 10 MG/ML IV BOLUS
INTRAVENOUS | Status: DC | PRN
  Administered 2020-08-02: 50 mg via INTRAVENOUS
  Administered 2020-08-02: 20 mg via INTRAVENOUS
  Administered 2020-08-02: 150 mg via INTRAVENOUS
  Administered 2020-08-02: 20 mg via INTRAVENOUS

## 2020-08-02 MED ORDER — MIDAZOLAM HCL 2 MG/2ML IJ SOLN
INTRAMUSCULAR | Status: AC
  Filled 2020-08-02: qty 2

## 2020-08-02 MED ORDER — LABETALOL HCL 5 MG/ML IV SOLN
10.0000 mg | INTRAVENOUS | Status: DC | PRN
  Administered 2020-08-02 – 2020-08-03 (×3): 10 mg via INTRAVENOUS
  Filled 2020-08-02 (×2): qty 4

## 2020-08-02 MED ORDER — ENALAPRILAT 1.25 MG/ML IV SOLN
1.2500 mg | Freq: Four times a day (QID) | INTRAVENOUS | Status: DC | PRN
  Filled 2020-08-02: qty 1

## 2020-08-02 MED ORDER — 0.9 % SODIUM CHLORIDE (POUR BTL) OPTIME
TOPICAL | Status: DC | PRN
  Administered 2020-08-02 (×3): 1000 mL

## 2020-08-02 MED ORDER — ORAL CARE MOUTH RINSE: 15.0000 mL | Freq: Once | OROMUCOSAL | Status: AC

## 2020-08-02 MED ORDER — BACITRACIN ZINC 500 UNIT/GM EX OINT
TOPICAL_OINTMENT | CUTANEOUS | Status: AC
  Filled 2020-08-02: qty 28.35

## 2020-08-02 MED ORDER — HEMOSTATIC AGENTS (NO CHARGE) OPTIME
TOPICAL | Status: DC | PRN
  Administered 2020-08-02: 1 via TOPICAL

## 2020-08-02 MED ORDER — DEXAMETHASONE SODIUM PHOSPHATE 4 MG/ML IJ SOLN
4.0000 mg | Freq: Four times a day (QID) | INTRAMUSCULAR | Status: DC
  Administered 2020-08-02 – 2020-08-03 (×3): 4 mg via INTRAVENOUS
  Filled 2020-08-02 (×3): qty 1

## 2020-08-02 SURGICAL SUPPLY — 77 items
BAND RUBBER #18 3X1/16 STRL (MISCELLANEOUS) ×8 IMPLANT
BLADE CLIPPER SURG (BLADE) ×4 IMPLANT
BLADE SURG 11 STRL SS (BLADE) ×4 IMPLANT
BUR ACORN 9.0 PRECISION (BURR) ×3 IMPLANT
BUR ACORN 9.0MM PRECISION (BURR) ×1
BUR SPIRAL ROUTER 2.3 (BUR) ×3 IMPLANT
BUR SPIRAL ROUTER 2.3MM (BUR) ×1
CANISTER SUCT 3000ML PPV (MISCELLANEOUS) ×8 IMPLANT
CNTNR URN SCR LID CUP LEK RST (MISCELLANEOUS) ×4 IMPLANT
CONT SPEC 4OZ STRL OR WHT (MISCELLANEOUS) ×4
COVER BURR HOLE 7 (Orthopedic Implant) ×8 IMPLANT
COVER BURR HOLE UNIV 10 (Orthopedic Implant) ×4 IMPLANT
DRAPE HALF SHEET 40X57 (DRAPES) ×4 IMPLANT
DRAPE MICROSCOPE LEICA (MISCELLANEOUS) ×4 IMPLANT
DRAPE NEUROLOGICAL W/INCISE (DRAPES) ×4 IMPLANT
DRAPE WARM FLUID 44X44 (DRAPES) ×4 IMPLANT
DRSG ADAPTIC 3X8 NADH LF (GAUZE/BANDAGES/DRESSINGS) IMPLANT
DRSG AQUACEL AG ADV 3.5X 6 (GAUZE/BANDAGES/DRESSINGS) ×8 IMPLANT
DRSG TELFA 3X8 NADH (GAUZE/BANDAGES/DRESSINGS) IMPLANT
ELECT COATED BLADE 2.86 ST (ELECTRODE) ×4 IMPLANT
ELECT REM PT RETURN 9FT ADLT (ELECTROSURGICAL) ×4
ELECTRODE REM PT RTRN 9FT ADLT (ELECTROSURGICAL) ×2 IMPLANT
FORCEPS BIPO MALIS IRRIG 9X1.5 (NEUROSURGERY SUPPLIES) ×4 IMPLANT
GAUZE 4X4 16PLY RFD (DISPOSABLE) IMPLANT
GAUZE SPONGE 4X4 12PLY STRL (GAUZE/BANDAGES/DRESSINGS) IMPLANT
GLOVE BIO SURGEON STRL SZ7.5 (GLOVE) ×4 IMPLANT
GLOVE BIOGEL PI IND STRL 7.5 (GLOVE) ×10 IMPLANT
GLOVE BIOGEL PI INDICATOR 7.5 (GLOVE) ×10
GLOVE EXAM NITRILE LRG STRL (GLOVE) IMPLANT
GLOVE EXAM NITRILE XL STR (GLOVE) IMPLANT
GLOVE SURG SS PI 6.5 STRL IVOR (GLOVE) ×16 IMPLANT
GOWN STRL REUS W/ TWL LRG LVL3 (GOWN DISPOSABLE) ×6 IMPLANT
GOWN STRL REUS W/ TWL XL LVL3 (GOWN DISPOSABLE) ×4 IMPLANT
GOWN STRL REUS W/TWL 2XL LVL3 (GOWN DISPOSABLE) IMPLANT
GOWN STRL REUS W/TWL LRG LVL3 (GOWN DISPOSABLE) ×6
GOWN STRL REUS W/TWL XL LVL3 (GOWN DISPOSABLE) ×4
HEMOSTAT POWDER KIT SURGIFOAM (HEMOSTASIS) ×8 IMPLANT
HEMOSTAT SURGICEL 2X14 (HEMOSTASIS) ×4 IMPLANT
HOOK DURA 1/2IN (MISCELLANEOUS) ×4 IMPLANT
HOOK RETRACTION 12 ELAST STAY (MISCELLANEOUS) ×12 IMPLANT
IV NS 1000ML (IV SOLUTION) ×2
IV NS 1000ML BAXH (IV SOLUTION) ×2 IMPLANT
KIT BASIN OR (CUSTOM PROCEDURE TRAY) ×4 IMPLANT
KIT DRAIN CSF ACCUDRAIN (MISCELLANEOUS) IMPLANT
KIT TURNOVER KIT B (KITS) ×4 IMPLANT
MARKER SPHERE PSV REFLC 13MM (MARKER) ×12 IMPLANT
NEEDLE HYPO 22GX1.5 SAFETY (NEEDLE) ×4 IMPLANT
NEEDLE SPNL 18GX3.5 QUINCKE PK (NEEDLE) IMPLANT
NS IRRIG 1000ML POUR BTL (IV SOLUTION) ×12 IMPLANT
PACK CRANIOTOMY CUSTOM (CUSTOM PROCEDURE TRAY) ×4 IMPLANT
PATTIES SURGICAL .25X.25 (GAUZE/BANDAGES/DRESSINGS) IMPLANT
PATTIES SURGICAL .5 X.5 (GAUZE/BANDAGES/DRESSINGS) IMPLANT
PATTIES SURGICAL .5 X3 (DISPOSABLE) IMPLANT
PATTIES SURGICAL 1/4 X 3 (GAUZE/BANDAGES/DRESSINGS) IMPLANT
PATTIES SURGICAL 1X1 (DISPOSABLE) IMPLANT
PIN MAYFIELD SKULL DISP (PIN) ×4 IMPLANT
SCREW SD AXS 1.5X3 (Screw) ×24 IMPLANT
SCREW UNIII AXS SD 1.5X4 (Screw) ×12 IMPLANT
SEALANT ADHERUS EXTEND TIP (MISCELLANEOUS) ×4 IMPLANT
SET CARTRIDGE AND TUBING (SET/KITS/TRAYS/PACK) ×4 IMPLANT
SET TUBING IRRIGATION DISP (TUBING) ×4 IMPLANT
SPONGE SURGIFOAM ABS GEL 100 (HEMOSTASIS) ×4 IMPLANT
STAPLER VISISTAT 35W (STAPLE) ×4 IMPLANT
SUT ETHILON 3 0 FSL (SUTURE) IMPLANT
SUT ETHILON 3 0 PS 1 (SUTURE) IMPLANT
SUT MNCRL AB 3-0 PS2 18 (SUTURE) IMPLANT
SUT NURALON 4 0 TR CR/8 (SUTURE) ×12 IMPLANT
SUT VIC AB 2-0 CP2 18 (SUTURE) ×4 IMPLANT
TIP STANDARD 36KHZ (INSTRUMENTS) ×4
TIP STD 36KHZ (INSTRUMENTS) ×2 IMPLANT
TOWEL GREEN STERILE (TOWEL DISPOSABLE) ×4 IMPLANT
TOWEL GREEN STERILE FF (TOWEL DISPOSABLE) ×4 IMPLANT
TRAY FOLEY MTR SLVR 16FR STAT (SET/KITS/TRAYS/PACK) ×4 IMPLANT
TUBE CONNECTING 12'X1/4 (SUCTIONS) ×1
TUBE CONNECTING 12X1/4 (SUCTIONS) ×3 IMPLANT
WATER STERILE IRR 1000ML POUR (IV SOLUTION) ×4 IMPLANT
WRENCH TORQUE 36KHZ (INSTRUMENTS) ×4 IMPLANT

## 2020-08-02 NOTE — Anesthesia Procedure Notes (Signed)
Arterial Line Insertion Start/End12/22/2021 7:00 AM, 08/02/2020 7:10 AM Performed by: Darral Dash, DO, Gwyndolyn Saxon, CRNA, CRNA  Patient location: Pre-op. Preanesthetic checklist: patient identified, IV checked, site marked, risks and benefits discussed, surgical consent, monitors and equipment checked, pre-op evaluation, timeout performed and anesthesia consent Lidocaine 1% used for infiltration Right, radial was placed Catheter size: 20 G Hand hygiene performed , maximum sterile barriers used  and Seldinger technique used Allen's test indicative of satisfactory collateral circulation Attempts: 2 Procedure performed without using ultrasound guided technique. Following insertion, dressing applied and Biopatch. Post procedure assessment: normal  Post procedure complications: local hematoma. Patient tolerated the procedure well with no immediate complications.

## 2020-08-02 NOTE — Anesthesia Procedure Notes (Signed)
Procedure Name: Intubation Date/Time: 08/02/2020 8:13 AM Performed by: Gwyndolyn Saxon, CRNA Pre-anesthesia Checklist: Patient identified, Emergency Drugs available, Suction available and Patient being monitored Patient Re-evaluated:Patient Re-evaluated prior to induction Oxygen Delivery Method: Circle system utilized Preoxygenation: Pre-oxygenation with 100% oxygen Induction Type: IV induction Ventilation: Mask ventilation without difficulty and Oral airway inserted - appropriate to patient size Laryngoscope Size: Miller and 3 Grade View: Grade II Tube type: Oral Tube size: 7.0 mm Number of attempts: 1 Airway Equipment and Method: Patient positioned with wedge pillow and Stylet Placement Confirmation: ETT inserted through vocal cords under direct vision,  positive ETCO2 and breath sounds checked- equal and bilateral Secured at: 21 cm Tube secured with: Tape Dental Injury: Teeth and Oropharynx as per pre-operative assessment

## 2020-08-02 NOTE — H&P (Signed)
CC: Glioma  HPI:     Patient is a 59 y.o. 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 Biopsy confirmed IDH wt GBM.  After discussion in interdisciplinary tumor board, plan was for cytoreductive benefit.    Patient Active Problem List   Diagnosis Date Noted  . Focal seizures ([place]) 07/24/2020  . 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 Hyponatremia 06/10/2020   Past Medical History:  Diagnosis Date  . Cancer ([place])    BRAIN TUMOR  . Headache   . Seizure [facility])     Past Surgical History:  Procedure Laterality Date  . APPLICATION OF CRANIAL NAVIGATION N/A 06/15/2020   Procedure: APPLICATION OF CRANIAL NAVIGATION;  Surgeon: [name], MD;  Location: [place];  Service: Neurosurgery;  Laterality: N/A;  . FRAMELESS  BIOPSY WITH BRAINLAB Left 06/15/2020   Procedure: Left temporal lobe stereotactic brain biopsy with brainlab;  Surgeon: [name], MD;  Location: [place];  Service: Neurosurgery;  Laterality: Left;    Medications Prior to Admission  Medication Sig Dispense Refill Last Dose  . Carboxymethylcellul-Glycerin (LUBRICATING EYE DROPS OP) Place 1 drop into both eyes daily as needed (dry eyes).   Past Week at Unknown time  . clonazePAM (KLONOPIN) 2 MG tablet Take 1 tablet (2 mg total) by mouth 2 (two) times daily. 60 tablet 0 08/02/2020 at 0430  . lacosamide (VIMPAT) 200 MG TABS tablet Take 0.5 tablets (100 mg total) by mouth 2 (two) times daily. 30 tablet 0 08/02/2020 at 0430  . levETIRAcetam (KEPPRA) 750 MG tablet Take 1 tablet (750 mg total) by mouth 2 (two) times daily. 60 tablet 0 08/02/2020 at 0430  . oxymetazoline (AFRIN) 0.05 % nasal spray Place 1 spray into both nostrils 2 (two) times daily as needed for congestion.   Past Month at Unknown time  . phenytoin (DILANTIN) 100 MG ER capsule Take 1 capsule (100 mg total) by mouth 2 (two) times daily.  60 capsule 11 08/02/2020 at 0430   No Known Allergies  Social History   Tobacco Use  . Smoking status: Never Smoker  . Smokeless tobacco: Never Used  Substance Use Topics  . Alcohol use: Never    Family History  Problem Relation Age of Onset  . Cancer Neg Hx      Review of Systems Pertinent items noted in HPI and remainder of comprehensive ROS otherwise negative.  Objective:   Patient Vitals for the past 8 hrs:  BP Temp Temp src Pulse Resp SpO2 Height Weight  08/02/20 0614 113/68 98.5 F (36.9 C) Oral 61 17 98 % -- --  08/02/20 1610 113/68 -- -- -- -- -- _0  (1.651 m) 68 kg   No intake/output data recorded. No intake/output data recorded.    General : Alert, cooperative, no distress, appears stated age   Head:  Normocephalic/atraumatic    Eyes: PERRL, conjunctiva/corneas clear, EOM's intact. Fundi could not be visualized Neck: Supple Chest:  Respirations unlabored Chest wall: no tenderness or deformity Heart: Regular rate and rhythm Abdomen: Soft, nontender and nondistended Extremities: warm and well-perfused Skin: normal turgor, color and texture Neurologic:  Alert, oriented x 3.  Cognitively slowed, some short term memory difficulties.  Eyes open spontaneously. PERRL, EOMI, VFC, no facial droop. V1-3 intact.  No dysarthria, tongue protrusion symmetric.  CNII-XII intact. Normal strength, sensation and reflexes throughout.  No pronator drift, full strength in legs  Data Review CBC:  Lab Results  Component Value Date   WBC 5.3 07/31/2020   RBC 5.26 07/31/2020   BMP:  Lab Results  Component Value Date   GLUCOSE 105 (H) 07/31/2020   CO2 27 07/31/2020   BUN 7 07/31/2020   CREATININE 0.69 07/31/2020   CALCIUM 9.3 07/31/2020   Radiology review: MRI reviewed.  Infiltrative left temporal lobe mass  Assessment:   Left temporal lobe glioma  Plan:   - resection today

## 2020-08-02 NOTE — Op Note (Signed)
Procedure(s): CRANIOTOMY LEFT TEMPORAL LOBECTOMY FOR TUMOR EXCISION APPLICATION OF CRANIAL NAVIGATION Procedure Note  Hunter Terry male 59 y.o. 08/02/2020  Procedure(s) and Anesthesia Type:    * CRANIOTOMY LEFT TEMPORAL LOBECTOMY FOR TUMOR EXCISION - General    * APPLICATION OF CRANIAL NAVIGATION - General  Surgeon(s) and Role:    [name], Dorcas Carrow, MD - Primary    * Zada Finders Joyice Faster, MD - Assisting   Indications: This is a 59 year old man who initially presented in status epilepticus and was found to have an infiltrative expansile nonenhancing lesion in his left temporal lobe.  It was involving mostly his middle temporal gyrus as well as some of the the parahippocampal structures.  A biopsy was performed and showed IDH wild-type TERT-mutated GBM.  His seizures eventually came under control with 4 antiepileptic agents.  I had a long discussion with the patient and family.  Although the mass had only shown slow progression on imaging, given the aggressive genotype, after interdisciplinary discussion, a resective surgery was recommended for purposes of cytoreduction as well as potentially for seizure control.   Risks, benefits, alternatives, expected convalescence were discussed with the patient and family.  Risks discussed included, but were not limited to, bleeding, pain, infection, seizure, scar, stroke, neurologic deficit, tumor recurrence, spinal fluid leak, coma, and death.  Informed consent was obtained.  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   Surgeon: [name]   Assistants: Deatra Ina, MD.  Please note there were no qualified trainees available to assist with surgery.  Assistance was required for retraction of neural structures.  Anesthesia: General endotracheal anesthesia  Procedure Detail  1. Left craniotomy for excision of temporal lobe mass with anterior temporal lobectomy 2.  Use of computer-assisted neuro navigation  The patient  was brought to the operating room.  General anesthesia was induced and patient was intubated by the anesthesia service.  After appropriate lines and monitors were placed, patient was positioned supine with the head turned to the right with all pressure points padded and eyes protected.  His head was placed in a Mayfield head clamp and affixed to the bed.  His left scalp hair was clipped, and the scalp was preprepped with alcohol and prepped and draped in sterile fashion.  A question mark shaped temporal lobectomy incision was planned starting just in front of the tragus.  1% lidocaine with epinephrine injected the skin.  A timeout was performed.  Preoperative antibiotics and Keppra were administered.  Incision was made with a 10 blade and the galea was incised.  The temporalis fascia and muscle were incised, leaving a superior cuff for later reattachment.  Myocutaneous flap was flapped forward and held in place with fishhooks.  High-speed drill was used to perform 3 bur holes which were used to resect the dura from the inner table of the skull.  Craniotome was used to perform craniotomy.  Meticulous epidural hemostasis was obtained.  The dura was then opened in C-shaped manner and flapped forward.  The temporal lobe and was inspected.  The middle temporal gyrus was abnormal appearing and grossly expanded compared with the superior temporal gyrus and inferior temporal gyrus.  A coronal section corticotomy was planned 4 cm from the temporal tip at the middle temporal gyrus.  A longitudinal corticotomy was made just inferior to the superior temporal sulcus, remaining in the middle temporal gyrus.  This is brought to the temporal pole.  The coronal cut was then extended inferiorly across the  inferior temporal gyrus and fusiform gyrus until the collateral sulcus was encountered.  The collateral sulcus was then followed superiorly into the white matter and the temporal horn ependyma was identified.  The lateral wall of  the temporal horn was then transected.  Quarter-inch Cottonoid was placed in the temporal horn.  The anterior longitudinal cut was then connected to the temporal horn opening which allowed for en bloc resection of the anterior temporal lobe.  The uncus and parahippocampal gyrus was then resected in subpial fashion.  The ICA, 3rd nerve, midbrain and PCA were identified through the pia arachnoid.  I then inspected the hippocampal head and tail in the temporal horn.  The hippocampus appeared normal size and caliber and normal in appearance grossly.  As the patient had shown significant improvement in his cognition and short-term memory from the time of his status epilepticus, I decided not to perform a hippocampectomy.  Meticulous hemostasis was obtained.  The dura was then closed in running fashion with 4-0 Nurolon.  The stitch line was reinforced with adherence dural spray.  The bone flap was then replaced with Stryker cranial plating system.  A medium Hemovac drain was placed under the myocutaneous flap and tunneled out the skin and secured with a stitch.  The muscle was reattached with 2-0 Vicryl stitches.  The skin was closed with 2-0 Vicryl stitches in buried infra fashion followed by staples.  A sterile dressing was placed.  Patient was then moved from the Mayfield head holder and extubated by the anesthesia service.  All counts were correct at the end of surgery.  No complications were noted.  Findings: Expanded middle temporal gyrus.  Hippocampal structures appeared grossly normal.  Estimated Blood Loss: 400 mL         Drains: Subgaleal drain         Blood Given: none          Specimens: left temporal lobe         Implants: Stryker cranial plating system        Complications:  * No complications entered in OR log *         Disposition: PACU - hemodynamically stable.         Condition: stable

## 2020-08-02 NOTE — Transfer of Care (Signed)
Immediate Anesthesia Transfer of Care Note  Patient: Hunter Terry  Procedure(s) Performed: CRANIOTOMY LEFT TEMPORAL LOBECTOMY FOR TUMOR EXCISION (N/A Head) APPLICATION OF CRANIAL NAVIGATION (N/A )  Patient Location: PACU  Anesthesia Type:General  Level of Consciousness: drowsy  Airway & Oxygen Therapy: Patient Spontanous Breathing and Patient connected to face mask oxygen  Post-op Assessment: Report given to RN and Post -op Vital signs reviewed and stable  Post vital signs: Reviewed and stable  Last Vitals:  Vitals Value Taken Time  BP 112/63 08/02/20 1251  Temp    Pulse 104 08/02/20 1300  Resp 18 08/02/20 1300  SpO2 99 % 08/02/20 1300  Vitals shown include unvalidated device data.  Last Pain:  Vitals:   08/02/20 0614  TempSrc: Oral  PainSc:          Complications: No complications documented.

## 2020-08-03 ENCOUNTER — Inpatient Hospital Stay (HOSPITAL_COMMUNITY): Payer: Medicaid Other

## 2020-08-03 LAB — CBC WITH DIFFERENTIAL/PLATELET
Abs Immature Granulocytes: 0.03 10*3/uL (ref 0.00–0.07)
Basophils Absolute: 0 10*3/uL (ref 0.0–0.1)
Basophils Relative: 0 %
Eosinophils Absolute: 0 10*3/uL (ref 0.0–0.5)
Eosinophils Relative: 0 %
HCT: 39.6 % (ref 39.0–52.0)
Hemoglobin: 13.6 g/dL (ref 13.0–17.0)
Immature Granulocytes: 0 %
Lymphocytes Relative: 10 %
Lymphs Abs: 1 10*3/uL (ref 0.7–4.0)
MCH: 28.5 pg (ref 26.0–34.0)
MCHC: 34.3 g/dL (ref 30.0–36.0)
MCV: 83 fL (ref 80.0–100.0)
Monocytes Absolute: 0.5 10*3/uL (ref 0.1–1.0)
Monocytes Relative: 5 %
Neutro Abs: 7.8 10*3/uL — ABNORMAL HIGH (ref 1.7–7.7)
Neutrophils Relative %: 85 %
Platelets: 311 10*3/uL (ref 150–400)
RBC: 4.77 MIL/uL (ref 4.22–5.81)
RDW: 14 % (ref 11.5–15.5)
WBC: 9.3 10*3/uL (ref 4.0–10.5)
nRBC: 0 % (ref 0.0–0.2)

## 2020-08-03 LAB — BASIC METABOLIC PANEL
Anion gap: 10 (ref 5–15)
BUN: 5 mg/dL — ABNORMAL LOW (ref 6–20)
CO2: 24 mmol/L (ref 22–32)
Calcium: 8.6 mg/dL — ABNORMAL LOW (ref 8.9–10.3)
Chloride: 106 mmol/L (ref 98–111)
Creatinine, Ser: 0.74 mg/dL (ref 0.61–1.24)
GFR, Estimated: >60 mL/min (ref >60)
Glucose, Bld: 150 mg/dL — ABNORMAL HIGH (ref 70–99)
Potassium: 3.9 mmol/L (ref 3.5–5.1)
Sodium: 140 mmol/L (ref 135–145)

## 2020-08-03 IMAGING — MR MR HEAD WO/W CM
14 of 16 series · 42 of 48 positions shown · IV contrast (gadavist)
Comparison: MR head without and with contrast [DATE].

CLINICAL DATA: Status post biopsy of left temporal lobe mass. IDH
wild type GBM.

EXAM:
MRI HEAD WITHOUT AND WITH CONTRAST
TECHNIQUE: Multiplanar, multiecho pulse sequences of the brain and surrounding
structures were obtained without and with intravenous contrast.
CONTRAST:  7.5mL GADAVIST GADOBUTROL 1 MMOL/ML IV SOLN

[Series 5: DWI · axial · 3.0mm · 0.88mm/px · z∈[-82,+71]mm · 8 of 104 slices shown (1 of 4)]
[im 1/104]
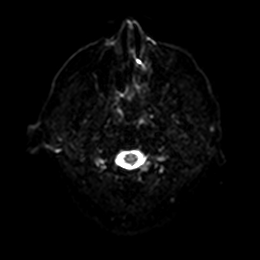
[im 15/104]
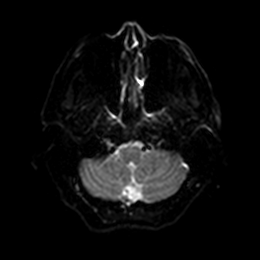
[im 30/104]
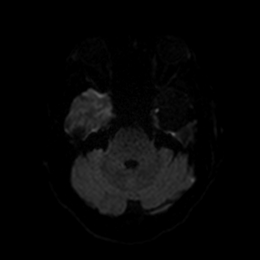
[im 45/104]
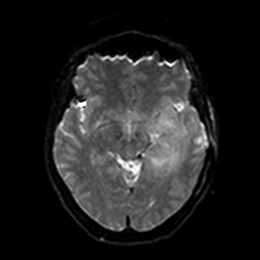
[im 59/104]
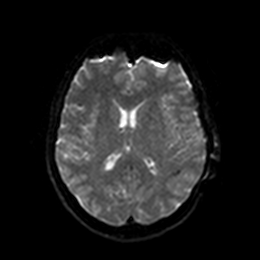
[im 74/104]
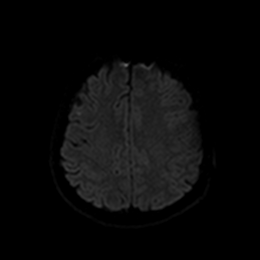
[im 89/104]
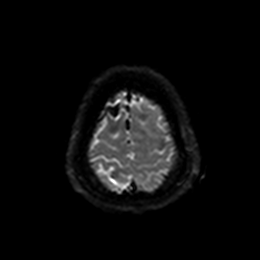
[im 104/104]
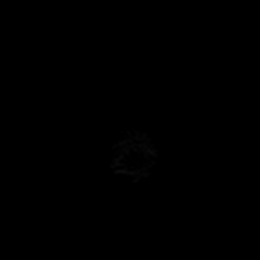

[Series 6: DWI · axial · 3.0mm · 0.88mm/px · z∈[-82,+71]mm · 3 of 52 slices shown (2 of 4)]
[im 1/52]
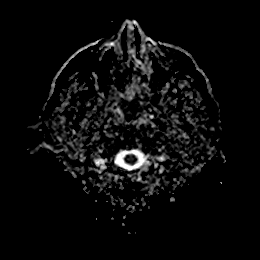
[im 26/52]
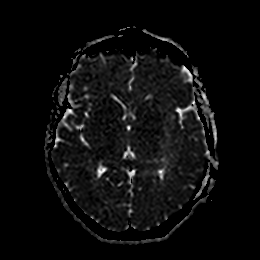
[im 52/52]
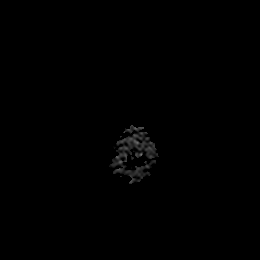

[Series 7: DWI · coronal · 4.0mm · 0.88mm/px · 5 of 68 slices shown (3 of 4)]
[im 1/68]
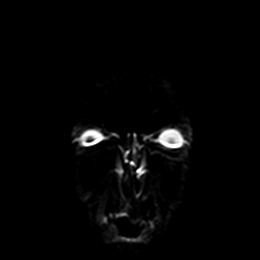
[im 17/68]
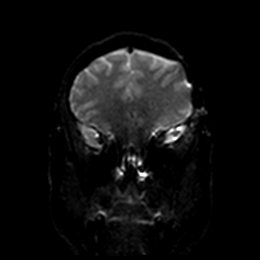
[im 34/68]
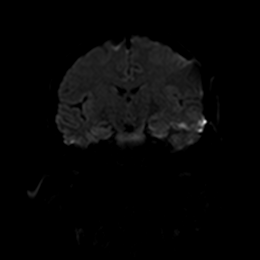
[im 51/68]
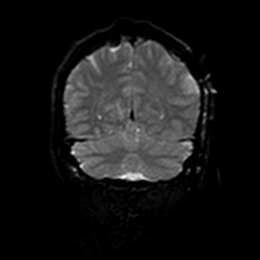
[im 68/68]
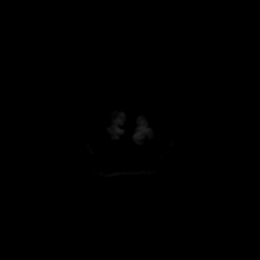

[Series 8: DWI · coronal · 4.0mm · 0.88mm/px · 2 of 34 slices shown (4 of 4)]
[im 1/34]
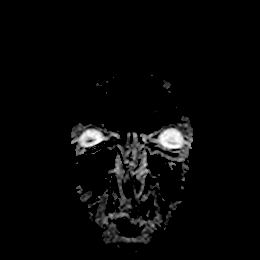
[im 34/34]
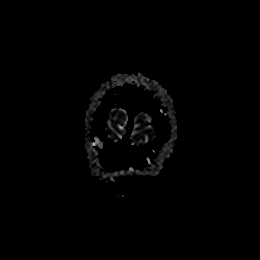

[Series 9: T1 · sagittal · 5.0mm · 0.75mm/px · 2 of 26 slices shown]
[im 1/26]
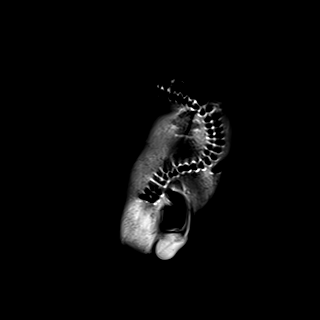
[im 26/26]
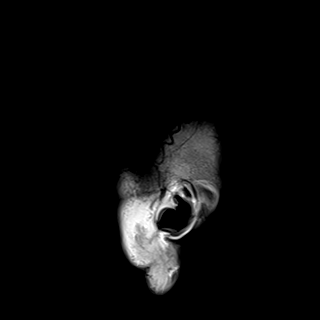

[Series 10: T2 · axial · 5.0mm · 0.72mm/px · z∈[-83,+72]mm · 2 of 27 slices shown]
[im 1/27]
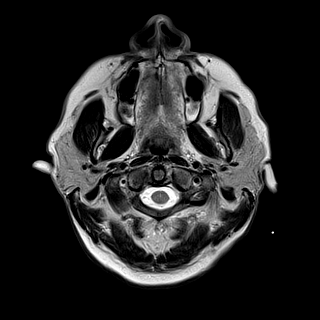
[im 27/27]
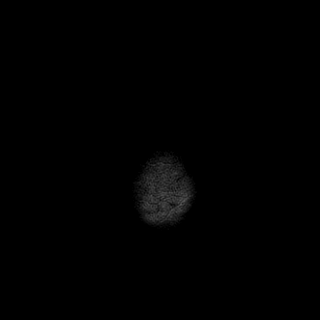

[Series 11: FLAIR · axial · 5.0mm · 0.45mm/px · z∈[-85,+70]mm · 2 of 27 slices shown]
[im 1/27]
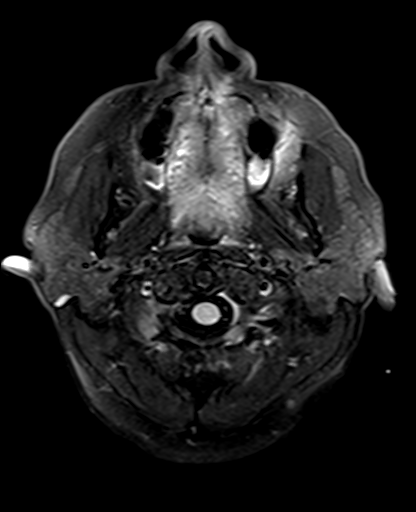
[im 27/27]
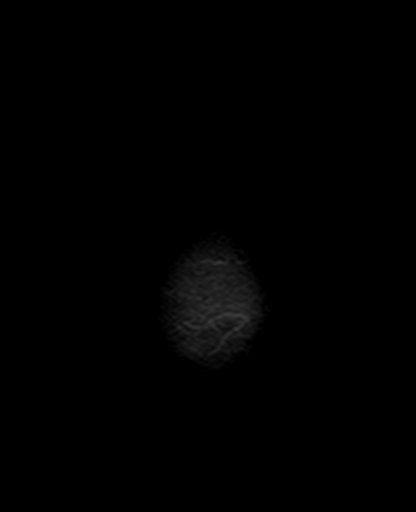

[Series 12: mag_images · axial · 3.0mm · 0.90mm/px · z∈[-84,+69]mm · 3 of 52 slices shown]
[im 1/52]
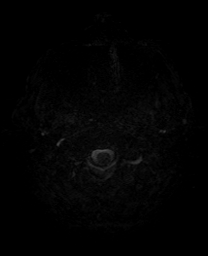
[im 26/52]
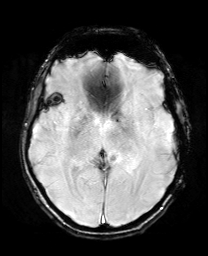
[im 52/52]
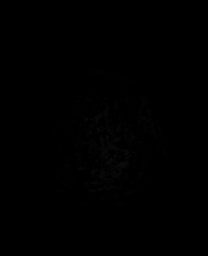

[Series 13: pha_images · axial · 3.0mm · 0.90mm/px · z∈[-84,+69]mm · 3 of 52 slices shown]
[im 1/52]
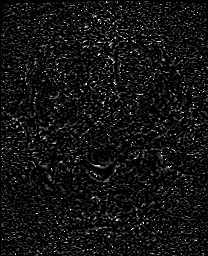
[im 26/52]
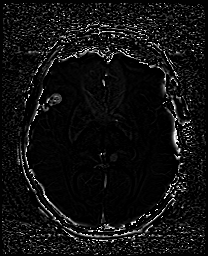
[im 52/52]
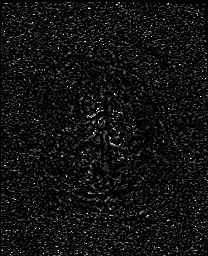

[Series 14: swi_images · axial · 3.0mm · 0.90mm/px · z∈[-84,+69]mm · 3 of 52 slices shown]
[im 1/52]
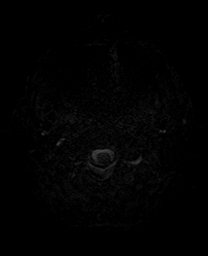
[im 26/52]
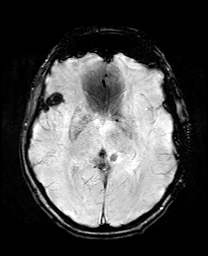
[im 52/52]
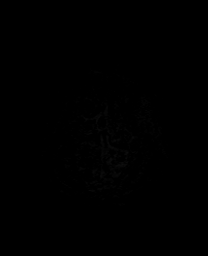

[Series 15: mip_images(sw) · axial · 24.0mm · 0.90mm/px · z∈[-73,+58]mm · 3 of 45 slices shown]
[im 1/45]
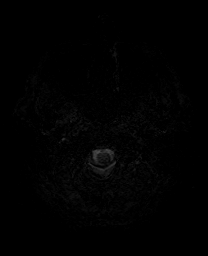
[im 23/45]
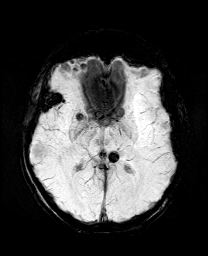
[im 45/45]
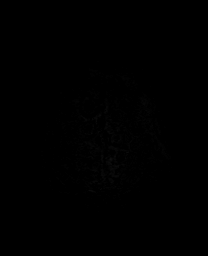

[Series 17: T2 post-contrast · coronal · 5.0mm · 0.72mm/px · 2 of 28 slices shown]
[im 1/28]
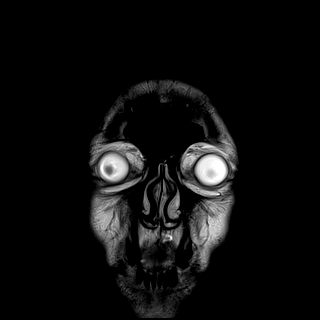
[im 28/28]
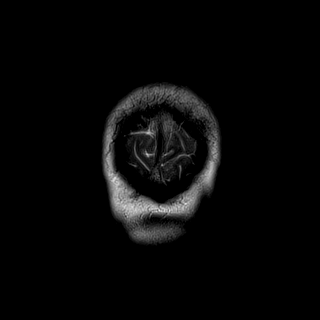

[Series 19: T1 post-contrast · coronal · 5.0mm · 0.34mm/px · 2 of 28 slices shown (1 of 2)]
[im 1/28]
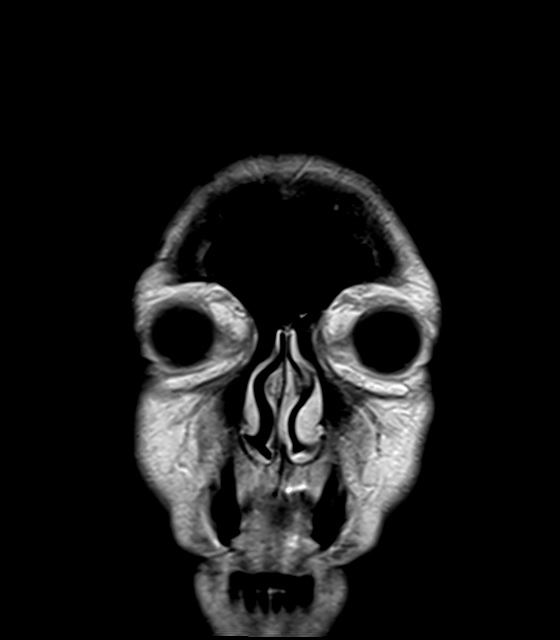
[im 28/28]
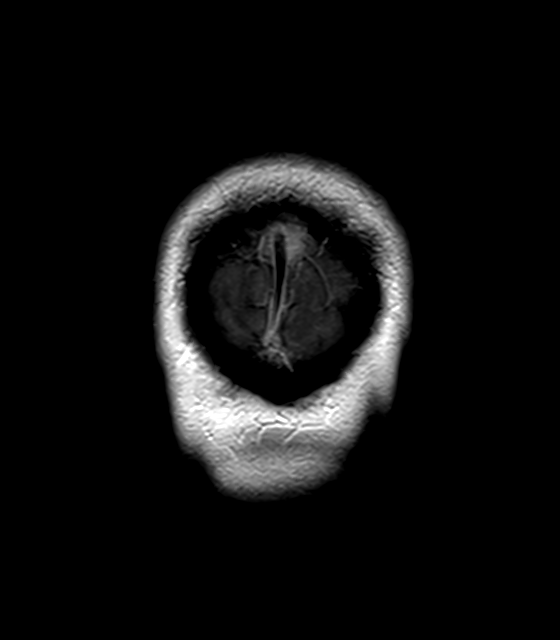

[Series 20: T1 post-contrast · sagittal · 5.0mm · 0.72mm/px · 2 of 26 slices shown (2 of 2)]
[im 1/26]
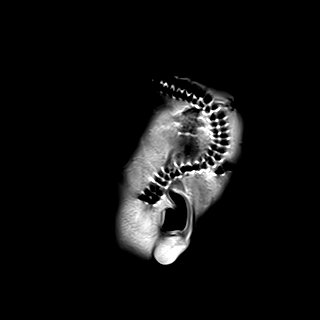
[im 26/26]
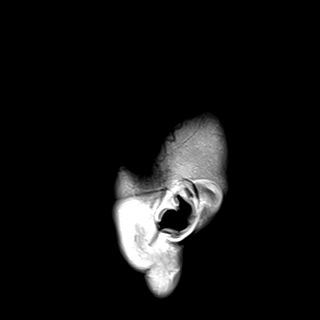

[42 of 48 positions shown; findings below may reference images not displayed]

FINDINGS: Brain: Partial resection of left temporal lobe mass lesion
demonstrated. Inferior left temporal lobe resected.

Cortical and subcortical T2 signal changes are again noted at the
residual anterior left temporal lobe extending superiorly into the
external capsule. There is some involvement posterior insular
cortex. No significant expansion of T2 signal changes are present.
No significant tumor enhancement is present.

1-2 mm of left-to-right midline shift is stable. Right hemisphere is
unremarkable.

Focal susceptibility in the anterior right frontal lobe likely
represents minimal extra-axial hemorrhage. There is also some
susceptibility in the left quadrigeminal plate cistern. Minimal
extra-axial fluid in focal dural enhancement is present subjacent to
the craniotomy site.

Vascular: Flow is present in the major intracranial arteries.

Skull and upper cervical spine: The craniocervical junction is
normal. Upper cervical spine is within normal limits. Marrow signal
is unremarkable.

Sinuses/Orbits: The paranasal sinuses and mastoid air cells are
clear. The globes and orbits are within normal limits.
IMPRESSION: 1. Partial resection of left temporal lobe GBM.
2. T2 signal changes compatible with residual tumor are again noted
in the medial and anterior left temporal lobe along the surgical
margins with extension superiorly to the external capsule, not
significantly changed.
3. Stable 1-2 mm of left-to-right midline shift.
4. Minimal extra-axial fluid and dural enhancement subjacent to the
craniotomy site. This likely represents postoperative change.

## 2020-08-03 MED ORDER — DEXAMETHASONE 4 MG PO TABS
4.0000 mg | ORAL_TABLET | Freq: Three times a day (TID) | ORAL | Status: DC
  Administered 2020-08-03 – 2020-08-04 (×4): 4 mg via ORAL
  Filled 2020-08-03 (×7): qty 1

## 2020-08-03 MED ORDER — DEXAMETHASONE 2 MG PO TABS: 2.0000 mg | ORAL_TABLET | Freq: Two times a day (BID) | ORAL | Status: DC

## 2020-08-03 MED ORDER — DEXAMETHASONE 0.5 MG PO TABS: 1.0000 mg | ORAL_TABLET | Freq: Two times a day (BID) | ORAL | Status: DC

## 2020-08-03 MED ORDER — DEXAMETHASONE 0.5 MG PO TABS: 1.0000 mg | ORAL_TABLET | Freq: Every day | ORAL | Status: DC

## 2020-08-03 MED ORDER — GADOBUTROL 1 MMOL/ML IV SOLN
7.5000 mL | Freq: Once | INTRAVENOUS | Status: AC | PRN
  Administered 2020-08-03: 7.5 mL via INTRAVENOUS

## 2020-08-03 MED ORDER — DEXAMETHASONE 2 MG PO TABS: 2.0000 mg | ORAL_TABLET | Freq: Three times a day (TID) | ORAL | Status: DC

## 2020-08-03 MED FILL — Thrombin For Soln 5000 Unit: CUTANEOUS | Qty: 5000 | Status: AC

## 2020-08-03 NOTE — Anesthesia Postprocedure Evaluation (Signed)
Anesthesia Post Note  Patient: [name]  Procedure(s) Performed: CRANIOTOMY LEFT TEMPORAL LOBECTOMY FOR TUMOR EXCISION (N/A Head) APPLICATION OF CRANIAL NAVIGATION (N/A )     Patient location during evaluation: PACU Anesthesia Type: General Level of consciousness: sedated Pain management: pain level controlled Vital Signs Assessment: post-procedure vital signs reviewed and stable Respiratory status: spontaneous breathing, nonlabored ventilation, respiratory function stable and patient connected to nasal cannula oxygen Cardiovascular status: blood pressure returned to baseline and stable Postop Assessment: no apparent nausea or vomiting Anesthetic complications: no   No complications documented.  Last Vitals:  Vitals:   08/03/20 0600 08/03/20 0800  BP: 112/64   Pulse: 71   Resp: 14   Temp:  36.9 C  SpO2: 99%     Last Pain:  Vitals:   08/03/20 0800  TempSrc: Oral  PainSc:                  March Rummage Raima Geathers

## 2020-08-03 NOTE — Progress Notes (Signed)
Subjective: Patient reports minimal headache  Objective: Vital signs in last 24 hours: Temp:  [97.1 F (36.2 C)-98.5 F (36.9 C)] 98.5 F (36.9 C) (12/23 0400) Pulse Rate:  [48-116] 71 (12/23 0600) Resp:  [11-24] 14 (12/23 0600) BP: (89-143)/(58-116) 112/64 (12/23 0600) SpO2:  [79 %-100 %] 99 % (12/23 0600) Arterial Line BP: (111-166)/(51-80) 129/55 (12/23 0600)  Intake/Output from previous day: 12/22 0701 - 12/23 0700 In: 3563.7 [I.V.:3288.7; IV Piggyback:275] Out: 6767 [Urine:3825; Drains:40; Blood:400] Intake/Output this shift: No intake/output data recorded.  NAD Speech fluent FC x 4 VFC Dressing c/d  Lab Results: Recent Labs    07/31/20 1457 08/03/20 0500  WBC 5.3 9.3  HGB 15.0 13.6  HCT 44.5 39.6  PLT 350 311   BMET Recent Labs    07/31/20 1457 08/03/20 0500  NA 139 140  K 4.0 3.9  CL 101 106  CO2 27 24  GLUCOSE 105* 150*  BUN 7 5*  CREATININE 0.69 0.74  CALCIUM 9.3 8.6*    Studies/Results: MR BRAIN W WO CONTRAST  Result Date: 08/03/2020 CLINICAL DATA:  Status post biopsy of left temporal lobe mass. IDH wild type GBM. EXAM: MRI HEAD WITHOUT AND WITH CONTRAST TECHNIQUE: Multiplanar, multiecho pulse sequences of the brain and surrounding structures were obtained without and with intravenous contrast. CONTRAST:  7.24m GADAVIST GADOBUTROL 1 MMOL/ML IV SOLN COMPARISON:  MR head without and with contrast 07/22/2020. FINDINGS: Brain: Partial resection of left temporal lobe mass lesion demonstrated. Inferior left temporal lobe resected. Cortical and subcortical T2 signal changes are again noted at the residual anterior left temporal lobe extending superiorly into the external capsule. There is some involvement posterior insular cortex. No significant expansion of T2 signal changes are present. No significant tumor enhancement is present. 1-2 mm of left-to-right midline shift is stable. Right hemisphere is unremarkable. Focal susceptibility in the anterior right  frontal lobe likely represents minimal extra-axial hemorrhage. There is also some susceptibility in the left quadrigeminal plate cistern. Minimal extra-axial fluid in focal dural enhancement is present subjacent to the craniotomy site. Vascular: Flow is present in the major intracranial arteries. Skull and upper cervical spine: The craniocervical junction is normal. Upper cervical spine is within normal limits. Marrow signal is unremarkable. Sinuses/Orbits: The paranasal sinuses and mastoid air cells are clear. The globes and orbits are within normal limits. IMPRESSION: 1. Partial resection of left temporal lobe GBM. 2. T2 signal changes compatible with residual tumor are again noted in the medial and anterior left temporal lobe along the surgical margins with extension superiorly to the external capsule, not significantly changed. 3. Stable 1-2 mm of left-to-right midline shift. 4. Minimal extra-axial fluid and dural enhancement subjacent to the craniotomy site. This likely represents postoperative change. Electronically Signed   By: CSan MorelleM.D.   On: 08/03/2020 02:58    Assessment/Plan: S/p left anterior temporal lobectomy for glioma, doing well POD#1 - downgrade to PCU - dex taper - likely dc tomorrow am    JVallarie Mare12/23/2021, 8:01 AM

## 2020-08-03 NOTE — Care Management (Signed)
0828 08-03-20 Case Manager received consult for medication assistance. Patients chart reflects that he has Medicaid-active. Case Manager did have the interpreter speak with family regarding medications. The Medicaid is new and the Case Manager made the patient and wife aware that medications should be no more than $3.50. At the most, the highest may be $10.00. Case Manager will continue to follow for transition of care needs.  Bethena Roys

## 2020-08-04 MED ORDER — HYDROCODONE-ACETAMINOPHEN 5-325 MG PO TABS: 1.0000 | ORAL_TABLET | ORAL | 0 refills | Status: DC | PRN

## 2020-08-04 MED ORDER — DEXAMETHASONE 2 MG PO TABS: 2.0000 mg | ORAL_TABLET | Freq: Three times a day (TID) | ORAL | 0 refills | Status: AC

## 2020-08-04 NOTE — Progress Notes (Signed)
Patient discharged, AVS printed with discharge instructions explained with [employer] Interpreter [phone].  Pt and family expressed concerns with dexmethasone with prior nausea and vomiting when taken in the past.  Tarri Fuller NP from [facility] who advised that the since patient has taken dexamethasone here the nausea and vomiting was unlikely related.  Further education provided to patient and wife.

## 2020-08-04 NOTE — Discharge Summary (Signed)
Physician Discharge Summary  Patient ID: Hunter Terry MRN: [id number] DOB/AGE: February 17, 1961 59 y.o.  Admit date: 08/02/2020 Discharge date: 08/04/2020  Admission Diagnoses:  Glioblastoma  Discharge Diagnoses:  Same Active Problems:   Glioblastoma [facility])   Discharged Condition: Stable  Hospital Course:  Hunter Terry is a 59 y.o. male who initially presented with status epilepticus and was found to have an infiltrative left temporal lobe mass.  This was biopsied and returned as IDH wild-type glioblastoma.  For purposes of cytoreduction and potential seizure control, surgical debulking was recommended to the patient.  He underwent the surgery on December 22 and tolerated the procedure well.  Postoperatively he was monitored in the ICU.  A postoperative MRI showed no complications, with expected residual in the hippocampal and posterior parahippocampal gyrus portion.  He was mobilized, and his pain was well controlled.  He was voiding without difficulty and tolerating a regular diet.  He was deemed ready for discharge home on postop day #2.   Treatments: Surgery -left anterior temporal lobectomy for glioma Discharge Exam: Blood pressure 120/66, pulse 89, temperature 98.3 F (36.8 C), temperature source Oral, resp. rate 18, height _0  (1.651 m), weight 68 kg, SpO2 97 %. Awake, alert, oriented Speech fluent, appropriate.  Mild cognitive slowing. CN grossly intact Visual fields full 5/5 BUE/BLE Wound c/d/i  Disposition: Discharge disposition: 01-Home or Self Care       Discharge Instructions    Call MD for:  difficulty breathing, headache or visual disturbances   Complete by: As directed    Call MD for:  extreme fatigue   Complete by: As directed    Call MD for:  hives   Complete by: As directed    Call MD for:  persistant dizziness or light-headedness   Complete by: As directed    Call MD for:  persistant nausea and vomiting   Complete by: As directed    Call MD for:   redness, tenderness, or signs of infection (pain, swelling, redness, odor or green/yellow discharge around incision site)   Complete by: As directed    Call MD for:  severe uncontrolled pain   Complete by: As directed    Call MD for:  temperature >100.4   Complete by: As directed    Diet - low sodium heart healthy   Complete by: As directed    Discharge instructions   Complete by: As directed    Call [phone] for a followup appointment. Take a stool softener while you are using pain medications.   Driving Restrictions   Complete by: As directed    Do not drive .   Increase activity slowly   Complete by: As directed    Lifting restrictions   Complete by: As directed    Do not lift more than 5 pounds. No excessive bending or twisting.   May shower / Bathe   Complete by: As directed    Remove the dressing for 1 days after surgery.  You may shower, but leave the incision alone.   Remove dressing in 24 hours   Complete by: As directed      Allergies as of 08/04/2020   No Known Allergies     Medication List    TAKE these medications   clonazePAM 2 MG tablet Commonly known as: KLONOPIN Take 1 tablet (2 mg total) by mouth 2 (two) times daily.   dexamethasone 2 MG tablet Commonly known as: DECADRON Take 1 tablet (2 mg total) by mouth 3 (three) times daily  for 7 days.   HYDROcodone-acetaminophen 5-325 MG tablet Commonly known as: NORCO/VICODIN Take 1 tablet by mouth every 4 (four) hours as needed for moderate pain.   lacosamide 200 MG Tabs tablet Commonly known as: VIMPAT Take 0.5 tablets (100 mg total) by mouth 2 (two) times daily.   levETIRAcetam 750 MG tablet Commonly known as: KEPPRA Take 1 tablet (750 mg total) by mouth 2 (two) times daily.   LUBRICATING EYE DROPS OP Place 1 drop into both eyes daily as needed (dry eyes).   oxymetazoline 0.05 % nasal spray Commonly known as: AFRIN Place 1 spray into both nostrils 2 (two) times daily as needed for  congestion.   phenytoin 100 MG ER capsule Commonly known as: Dilantin Take 1 capsule (100 mg total) by mouth 2 (two) times daily.       Follow-up Information    [name], MD Follow up in 10 day(s).   Specialty: Neurosurgery Contact information: [address] [place] [address] [phone]               Signed: [name] 08/04/2020, 9:59 AM

## 2020-08-04 NOTE — Discharge Instructions (Signed)
Remove dressing 12/25. Ok to shower after.  Use baby shampoo for head.  Do not directly scrub wound.  No lifting > 15 lbs, no strenuous activity, no driving.

## 2020-08-04 NOTE — Discharge Summary (Signed)
Physician Discharge Summary  Patient ID: Hunter Terry MRN: [id number] DOB/AGE: 11/21/1960 59 y.o.  Admit date: 08/02/2020 Discharge date: 08/04/2020  Admission Diagnoses: Glioblastoma  Discharge Diagnoses: The same Active Problems:   Glioblastoma [facility])   Discharged Condition: good  Hospital Course: Dr. [name] performed a resection of the patient glioblastoma on 08/02/2020.  The patient's postoperative course was unremarkable.  On postoperative day #2 he was doing well and requested discharge to home.  He was given written and oral discharge instructions.  He was instructed to not drive and to call the office next week for a follow-up appointment and removal of his staples.  The patient's questions were answered.  Consults: None Significant Diagnostic Studies: None Treatments: Left craniotomy for tumor resection Discharge Exam: Blood pressure 120/66, pulse 89, temperature 98.3 F (36.8 C), temperature source Oral, resp. rate 18, height 5\' 5"  (1.651 m), weight 68 kg, SpO2 97 %. The patient is alert and pleasant.  He is oriented x3.  His speech and strength is normal.  Disposition: Home with his family.  Discharge Instructions    Call MD for:  difficulty breathing, headache or visual disturbances   Complete by: As directed    Call MD for:  extreme fatigue   Complete by: As directed    Call MD for:  hives   Complete by: As directed    Call MD for:  persistant dizziness or light-headedness   Complete by: As directed    Call MD for:  persistant nausea and vomiting   Complete by: As directed    Call MD for:  redness, tenderness, or signs of infection (pain, swelling, redness, odor or green/yellow discharge around incision site)   Complete by: As directed    Call MD for:  severe uncontrolled pain   Complete by: As directed    Call MD for:  temperature >100.4   Complete by: As directed    Diet - low sodium heart healthy   Complete by: As directed    Discharge instructions    Complete by: As directed    Call [phone] for a followup appointment. Take a stool softener while you are using pain medications.   Driving Restrictions   Complete by: As directed    Do not drive .   Increase activity slowly   Complete by: As directed    Lifting restrictions   Complete by: As directed    Do not lift more than 5 pounds. No excessive bending or twisting.   May shower / Bathe   Complete by: As directed    Remove the dressing for 1 days after surgery.  You may shower, but leave the incision alone.   Remove dressing in 24 hours   Complete by: As directed      Allergies as of 08/04/2020   No Known Allergies     Medication List    TAKE these medications   clonazePAM 2 MG tablet Commonly known as: KLONOPIN Take 1 tablet (2 mg total) by mouth 2 (two) times daily.   dexamethasone 2 MG tablet Commonly known as: DECADRON Take 1 tablet (2 mg total) by mouth 3 (three) times daily for 7 days.   HYDROcodone-acetaminophen 5-325 MG tablet Commonly known as: NORCO/VICODIN Take 1 tablet by mouth every 4 (four) hours as needed for moderate pain.   lacosamide 200 MG Tabs tablet Commonly known as: VIMPAT Take 0.5 tablets (100 mg total) by mouth 2 (two) times daily.   levETIRAcetam 750 MG tablet Commonly known as: KEPPRA  Take 1 tablet (750 mg total) by mouth 2 (two) times daily.   LUBRICATING EYE DROPS OP Place 1 drop into both eyes daily as needed (dry eyes).   oxymetazoline 0.05 % nasal spray Commonly known as: AFRIN Place 1 spray into both nostrils 2 (two) times daily as needed for congestion.   phenytoin 100 MG ER capsule Commonly known as: Dilantin Take 1 capsule (100 mg total) by mouth 2 (two) times daily.        Signed: Ophelia Charter 08/04/2020, 9:39 AM

## 2020-08-06 ENCOUNTER — Encounter (HOSPITAL_COMMUNITY): Payer: Self-pay | Admitting: Neurosurgery

## 2020-08-08 ENCOUNTER — Other Ambulatory Visit: Payer: Self-pay | Admitting: Radiation Therapy

## 2020-08-14 ENCOUNTER — Other Ambulatory Visit: Payer: Self-pay

## 2020-08-14 ENCOUNTER — Inpatient Hospital Stay: Payer: Medicaid Other | Attending: Internal Medicine | Admitting: Internal Medicine

## 2020-08-14 VITALS — BP 112/65 | HR 76 | Temp 97.4°F | Resp 18 | Ht 65.0 in | Wt 150.3 lb

## 2020-08-14 DIAGNOSIS — Z79899 Other long term (current) drug therapy: Secondary | ICD-10-CM | POA: Diagnosis not present

## 2020-08-14 DIAGNOSIS — C719 Malignant neoplasm of brain, unspecified: Secondary | ICD-10-CM | POA: Diagnosis not present

## 2020-08-14 DIAGNOSIS — R569 Unspecified convulsions: Secondary | ICD-10-CM | POA: Insufficient documentation

## 2020-08-14 DIAGNOSIS — C712 Malignant neoplasm of temporal lobe: Secondary | ICD-10-CM | POA: Diagnosis not present

## 2020-08-14 MED ORDER — CLONAZEPAM 2 MG PO TABS: 2.0000 mg | ORAL_TABLET | Freq: Two times a day (BID) | ORAL | 0 refills | Status: DC

## 2020-08-14 NOTE — Progress Notes (Signed)
[facility] at [address] [place], [place] [id number] [phone]   Interval Evaluation  Date of Service: 08/14/20 [name] Patient MRN: [id number] Patient DOB: [birth year] Provider: Ventura Sellers, MD  Identifying Statement:  Hunter Terry is a 60 y.o. 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 [name]; path reveals glioblastoma IDH-wt   08/02/2020 Surgery   Debulking craniotomy with Dr. [name]     Biomarkers:  MGMT Unknown.  IDH 1/2 Wild type.  EGFR Unknown  TERT "Mutated   Interval History:  Hunter Terry presents today for evaluation following craniotomy with Dr. [name] on 12/22.  He tolerated surgery well, no complications.  He has completed steriod taper.  No balance issues, motor dysfunction.  No further seizures despite decreasing the vimpat dosing.  Continues on Keppra, Dilantin, Klonopin as prior.    H+P (07/24/20) Patient presented to medical attention in late October, 2021 with new onset seizure.  Event was decribed as loss of consciousness, sudden, without clarity on further details aside from altered mental status upon awakening.  CNS imaging demonsrated non-enhancing mass within left temporal lobe c/w likely glioma; he underwent stereotactic biopsy with Dr. [name] on 06/15/20.  There was significant delay in finalizing path, explaining delay in follow up and evaluation.  He denies any seizures since discharge from hospital, on 4 anti-seizure drugs.  He does complain of dizziness and drowsiness with the medications, however.  Also describes impaired short term memory.  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    Medications: Current Outpatient  Medications on File Prior to Visit  Medication Sig Dispense Refill  . Carboxymethylcellul-Glycerin (LUBRICATING EYE DROPS OP) Place 1 drop into both eyes daily as needed (dry eyes).    [place] HYDROcodone-acetaminophen (NORCO/VICODIN) 5-325 MG tablet Take 1 tablet by mouth every 4 (four) hours as needed for moderate pain. 20 tablet 0  . levETIRAcetam (KEPPRA) 750 MG tablet Take 1 tablet (750 mg total) by mouth 2 (two) times daily. 60 tablet 0  . oxymetazoline (AFRIN) 0.05 % nasal spray Place 1 spray into both nostrils 2 (two) times daily as needed for congestion.    . phenytoin (DILANTIN) 100 MG ER capsule Take 1 capsule (100 mg total) by mouth 2 (two) times daily. 60 capsule 11   No current facility-administered medications on file prior to visit.    Allergies: No Known Allergies Past Medical History:  Past Medical History:  Diagnosis Date  . Cancer ([place])    BRAIN TUMOR  . Headache   . Seizure [facility])    Past Surgical History:  Past Surgical History:  Procedure Laterality Date  . APPLICATION OF CRANIAL NAVIGATION N/A 06/15/2020   Procedure: APPLICATION OF CRANIAL NAVIGATION;  Surgeon: [name], MD;  Location: [place];  Service: Neurosurgery;  Laterality: N/A;  . APPLICATION OF CRANIAL NAVIGATION N/A 08/02/2020   Procedure: APPLICATION OF CRANIAL NAVIGATION;  Surgeon: [name], MD;  Location: [place];  Service: Neurosurgery;  Laterality: N/A;  . CRANIOTOMY N/A 08/02/2020   Procedure: CRANIOTOMY LEFT TEMPORAL LOBECTOMY FOR TUMOR EXCISION;  Surgeon: [name], MD;  Location: [place];  Service: Neurosurgery;  Laterality: N/A;  . FRAMELESS  BIOPSY WITH BRAINLAB Left 06/15/2020   Procedure: Left temporal lobe  stereotactic brain biopsy with brainlab;  Surgeon: [name], MD;  Location: [place];  Service: Neurosurgery;  Laterality: Left;   Social History:  Social History   Socioeconomic History  . Marital status: Married    Spouse name: Not on file  . Number of  children: Not on file  . Years of education: Not on file  . Highest education level: Not on file  Occupational History  . Not on file  Tobacco Use  . Smoking status: Never Smoker  . Smokeless tobacco: Never Used  Substance and Sexual Activity  . Alcohol use: Never  . Drug use: Not on file  . Sexual activity: Not on file  Other Topics Concern  . Not on file  Social History Narrative  . Not on file   Social Determinants of Health   Financial Resource Strain: Not on file  Food Insecurity: Not on file  Transportation Needs: Not on file  Physical Activity: Not on file  Stress: Not on file  Social Connections: Not on file  Intimate Partner Violence: Not on file   Family History:  Family History  Problem Relation Age of Onset  . Cancer Neg Hx     Review of Systems: Constitutional: Doesn't report fevers, chills or abnormal weight loss Eyes: Doesn't report blurriness of vision Ears, nose, mouth, throat, and face: Doesn't report sore throat Respiratory: Doesn't report cough, dyspnea or wheezes Cardiovascular: Doesn't report palpitation, chest discomfort  Gastrointestinal:  Doesn't report nausea, constipation, diarrhea GU: Doesn't report incontinence Skin: Doesn't report skin rashes Neurological: Per HPI Musculoskeletal: Doesn't report joint pain Behavioral/Psych: Doesn't report anxiety  Physical Exam: Vitals:   08/14/20 1025  BP: 112/65  Pulse: 76  Resp: 18  Temp: (!) 97.4 F (36.3 C)  SpO2: 100%   KPS: 90. General: Alert, cooperative, pleasant, in no acute distress Head: Normal EENT: No conjunctival injection or scleral icterus.  Lungs: Resp effort normal Cardiac: Regular rate Abdomen: Non-distended abdomen Skin: No rashes cyanosis or petechiae. Extremities: No clubbing or edema  Neurologic Exam: Mental Status: Awake, alert, attentive to examiner. Oriented to self and environment. Language is fluent with intact comprehension.  Cranial Nerves: Visual acuity  is grossly normal. Visual fields are full. Extra-ocular movements intact. No ptosis. Face is symmetric Motor: Tone and bulk are normal. Power is full in both arms and legs. Reflexes are symmetric, no pathologic reflexes present.  Sensory: Intact to light touch Gait: Normal.   Labs: I have reviewed the data as listed    Component Value Date/Time   NA 140 08/03/2020 0500   K 3.9 08/03/2020 0500   CL 106 08/03/2020 0500   CO2 24 08/03/2020 0500   GLUCOSE 150 (H) 08/03/2020 0500   BUN 5 (L) 08/03/2020 0500   CREATININE 0.74 08/03/2020 0500   CALCIUM 8.6 (L) 08/03/2020 0500   PROT 7.7 06/09/2020 2055   ALBUMIN 3.6 06/15/2020 0140   AST 30 06/09/2020 2055   ALT 23 06/09/2020 2055   ALKPHOS 55 06/09/2020 2055   BILITOT 1.0 06/09/2020 2055   GFRNONAA >60 08/03/2020 0500   Lab Results  Component Value Date   WBC 9.3 08/03/2020   NEUTROABS 7.8 (H) 08/03/2020   HGB 13.6 08/03/2020   HCT 39.6 08/03/2020   MCV 83.0 08/03/2020   PLT 311 08/03/2020    Imaging:  MR BRAIN W WO CONTRAST  Result Date: 08/03/2020 CLINICAL DATA:  Status post biopsy of left temporal lobe mass. IDH wild type GBM. EXAM: MRI HEAD WITHOUT AND  WITH CONTRAST TECHNIQUE: Multiplanar, multiecho pulse sequences of the brain and surrounding structures were obtained without and with intravenous contrast. CONTRAST:  7.2mL GADAVIST GADOBUTROL 1 MMOL/ML IV SOLN COMPARISON:  MR head without and with contrast 07/22/2020. FINDINGS: Brain: Partial resection of left temporal lobe mass lesion demonstrated. Inferior left temporal lobe resected. Cortical and subcortical T2 signal changes are again noted at the residual anterior left temporal lobe extending superiorly into the external capsule. There is some involvement posterior insular cortex. No significant expansion of T2 signal changes are present. No significant tumor enhancement is present. 1-2 mm of left-to-right midline shift is stable. Right hemisphere is unremarkable. Focal  susceptibility in the anterior right frontal lobe likely represents minimal extra-axial hemorrhage. There is also some susceptibility in the left quadrigeminal plate cistern. Minimal extra-axial fluid in focal dural enhancement is present subjacent to the craniotomy site. Vascular: Flow is present in the major intracranial arteries. Skull and upper cervical spine: The craniocervical junction is normal. Upper cervical spine is within normal limits. Marrow signal is unremarkable. Sinuses/Orbits: The paranasal sinuses and mastoid air cells are clear. The globes and orbits are within normal limits. IMPRESSION: 1. Partial resection of left temporal lobe GBM. 2. T2 signal changes compatible with residual tumor are again noted in the medial and anterior left temporal lobe along the surgical margins with extension superiorly to the external capsule, not significantly changed. 3. Stable 1-2 mm of left-to-right midline shift. 4. Minimal extra-axial fluid and dural enhancement subjacent to the craniotomy site. This likely represents postoperative change. Electronically Signed   By: San Morelle M.D.   On: 08/03/2020 02:58   MR BRAIN W WO CONTRAST  Result Date: 07/22/2020 CLINICAL DATA:  Glioma of brain EXAM: MRI HEAD WITHOUT AND WITH CONTRAST TECHNIQUE: Multiplanar, multiecho pulse sequences of the brain and surrounding structures were obtained without and with intravenous contrast. CONTRAST:  7.50mL GADAVIST GADOBUTROL 1 MMOL/ML IV SOLN COMPARISON:  06/14/2020 MRI and prior, 06/16/2020 CT FINDINGS: Brain: No new diffusion-weighted signal abnormality. Sequela of left temporal biopsy track with chronic blood product deposition. No ventriculomegaly, midline shift or extra-axial fluid collection. Small focus of left basal ganglia SWI signal dropout. T2/FLAIR hyperintense infiltrative left temporal mass is unchanged. Abutment of the left midbrain is unchanged. Peripheral thin enhancement along the biopsy tract. No  additional foci of abnormal enhancement. Vascular: Normal flow voids. Skull and upper cervical spine: Normal signal intensity. Sinuses/Orbits: Normal orbits. Pneumatized paranasal sinuses and mastoid air cells. Other: None. IMPRESSION: T2/FLAIR hyperintense infiltrative left temporal mass is unchanged. Sequela of interval left temporal biopsy with minimal enhancement along the tract. No acute intracranial finding. Electronically Signed   By: Primitivo Gauze M.D.   On: 07/22/2020 13:09    Assessment/Plan Glioblastoma ([place]) [C71.9]  Hunter Terry is clinically stable today, now having undergone debulking partial resection with Dr. [name].  No enhancing volume remains, only T2/FLAIR signal involving mesial left temporal lobe and subcortical white matter.   We again extensively discussed pathology and prognosis with him and his wife at bedside with Patent attorney.  They understand that a further tissue simple was sent for analysis which is still pending.  We ultimately recommended proceeding with course of intensity modulated radiation therapy and concurrent daily Temozolomide.  Radiation will be administered Mon-Fri over 6 weeks, Temodar will be dosed at $Remove'75mg'ZiVwBzG$ /m2 to be given daily over 42 days.  We reviewed side effects of temodar, including fatigue, nausea/vomiting, constipation, and cytopenias.  Chemotherapy should be held for the following:  [place]  less than 1,000  Platelets less than 100,000  LFT or creatinine greater than 2x ULN  If clinical concerns/contraindications develop  Every 2 weeks during radiation, labs will be checked accompanied by a clinical evaluation in the brain tumor clinic.  Asked to decrease Vimpat to $RemoveB'100mg'pvTEyXGd$  daily x7 days, then stop if tolerated.    He will return to clinic in 2 weeks for further review of treatment plan and titration of AEDs; next will be careful down-titration of Klonopin.  We appreciate the opportunity to participate in the care of [place].    All questions were answered. The patient knows to call the clinic with any problems, questions or concerns. No barriers to learning were detected.  The total time spent in the encounter was 40 minutes and more than 50% was on counseling and review of test results   Ventura Sellers, MD Medical Director of Neuro-Oncology [facility] at [place] 08/14/20 11:54 AM

## 2020-08-17 LAB — SURGICAL PATHOLOGY

## 2020-08-18 ENCOUNTER — Telehealth: Payer: Self-pay

## 2020-08-18 ENCOUNTER — Other Ambulatory Visit: Payer: Self-pay

## 2020-08-18 ENCOUNTER — Ambulatory Visit: Payer: Medicaid Other | Attending: Internal Medicine

## 2020-08-18 DIAGNOSIS — R2681 Unsteadiness on feet: Secondary | ICD-10-CM | POA: Insufficient documentation

## 2020-08-18 DIAGNOSIS — R2689 Other abnormalities of gait and mobility: Secondary | ICD-10-CM | POA: Diagnosis not present

## 2020-08-18 NOTE — Therapy (Addendum)
[facility] [address] [place], [place], [address] Phone: [phone]   Fax:  [phone]  Physical Therapy Evaluation  Patient Details  Name: Hunter Terry MRN: [id number] Date of Birth: 1961/05/07 Referring Provider (PT): Cecil Cobbs   Encounter Date: 08/18/2020   PT End of Session - 08/18/20 1239    Visit Number 1    Number of Visits 5    Authorization Type medicaid    PT Start Time 1228    PT Stop Time 1314    PT Time Calculation (min) 46 min    Equipment Utilized During Treatment Gait belt    Activity Tolerance Patient tolerated treatment well    Behavior During Therapy [facility] for tasks assessed/performed           Past Medical History:  Diagnosis Date  . Cancer ([place])    BRAIN TUMOR  . Headache   . Seizure [facility])     Past Surgical History:  Procedure Laterality Date  . APPLICATION OF CRANIAL NAVIGATION N/A 06/15/2020   Procedure: APPLICATION OF CRANIAL NAVIGATION;  Surgeon: [name], MD;  Location: [facility];  Service: Neurosurgery;  Laterality: N/A;  . APPLICATION OF CRANIAL NAVIGATION N/A 08/02/2020   Procedure: APPLICATION OF CRANIAL NAVIGATION;  Surgeon: [name], MD;  Location: [facility];  Service: Neurosurgery;  Laterality: N/A;  . CRANIOTOMY N/A 08/02/2020   Procedure: CRANIOTOMY LEFT TEMPORAL LOBECTOMY FOR TUMOR EXCISION;  Surgeon: [name], MD;  Location: [facility];  Service: Neurosurgery;  Laterality: N/A;  . FRAMELESS  BIOPSY WITH BRAINLAB Left 06/15/2020   Procedure: Left temporal lobe stereotactic brain biopsy with brainlab;  Surgeon: [name], MD;  Location: [facility];  Service: Neurosurgery;  Laterality: Left;    There were no vitals filed for this visit.    Subjective Assessment - 08/18/20 1229    Subjective Pt was referred after diagnosis of glioblastoma with recent debulking craniotomy with Dr. [name] on 08/02/20. Pt still has staples in from surgery on left  side of head. Sees Dr. [name] on Monday to follow up. Dr. Mickeal Skinner is his oncologist. Pt was independent prior to tumor. Since surgery pt reports his memory has not been as good. Finds he forgets names often. Feels that his balance may be a little off. Denies any falls. Will start radiation therapy M-F end of January after the 18th for 6 weeks. Sessions are about 20 min.    Patient is accompained by: Interpreter;Family member   wife and video interpreter, Fermin   Pertinent History no significant PMH per pt    Patient Stated Goals Pt would like to improve his memory.    Currently in Pain? Yes    Pain Score --   always a little   Pain Location Head    Pain Descriptors / Indicators Headache    Pain Type Surgical pain    Pain Onset More than a month ago    Pain Frequency Intermittent    Aggravating Factors  laying on left side    Pain Relieving Factors laying on right              [facility] PT Assessment - 08/18/20 1243      Assessment   Medical Diagnosis glioblastoma    Referring Provider (PT) Cecil Cobbs    Onset Date/Surgical Date 08/02/20   surgery date   Hand Dominance Right      Balance Screen   Has the patient fallen in the past 6  months No    Has the patient had a decrease in activity level because of a fear of falling?  No    Is the patient reluctant to leave their home because of a fear of falling?  No      Home Environment   Living Environment Private residence    Living Arrangements Spouse/significant other;Children    Available Help at Discharge Family    Type of [place] to enter    Entrance Stairs-Number of Steps [place] One level    [place] - 2 wheels    Additional Comments 52  month old baby, 69 years old      Prior Function   Level of Independence Independent    Vocation Full time employment    Vocation Requirements truck driver    Leisure play guitar      Cognition   Overall  Cognitive Status Impaired/Different from baseline    Area of Impairment Memory      Observation/Other Assessments   Observations Needs reading glasses at times for small print. Denies any change in vision. Peripheral vision intact.    Skin Integrity incision site to left side of head with staples in place      Sensation   Light Touch Appears Intact      ROM / Strength   AROM / PROM / Strength Strength      Strength   Overall Strength Comments Strength grossly WNL in BLE and BUE      Transfers   Transfers Sit to Stand;Stand to Sit    Sit to Stand 6: Modified independent (Device/Increase time)    Five time sit to stand comments  19.96 sec from chair without hands    Stand to Sit 6: Modified independent (Device/Increase time)      Ambulation/Gait   Ambulation/Gait Yes    Ambulation/Gait Assistance 5: Supervision    Ambulation Distance (Feet) 100 Feet    Assistive device None    Gait Pattern Step-through pattern;Decreased step length - right;Decreased step length - left    Ambulation Surface Level;Indoor    Gait velocity 11.79 sec=0.34m/s    Stairs Yes    Stairs Assistance 6: Modified independent (Device/Increase time)    Stair Management Technique Two rails;Alternating pattern    Number of Stairs 4    Height of Stairs 6      Functional Gait  Assessment   Gait assessed  Yes    Gait Level Surface Walks 20 ft in less than 7 sec but greater than 5.5 sec, uses assistive device, slower speed, mild gait deviations, or deviates 6-10 in outside of the 12 in walkway width.    Change in Gait Speed Able to change speed, demonstrates mild gait deviations, deviates 6-10 in outside of the 12 in walkway width, or no gait deviations, unable to achieve a major change in velocity, or uses a change in velocity, or uses an assistive device.    Gait with Horizontal Head Turns Performs head turns smoothly with slight change in gait velocity (eg, minor disruption to smooth gait path), deviates 6-10 in  outside 12 in walkway width, or uses an assistive device.    Gait with Vertical Head Turns Performs task with slight change in gait velocity (eg, minor disruption to smooth gait path), deviates 6 - 10 in outside 12 in walkway width or uses assistive device    Gait  and Pivot Turn Pivot turns safely in greater than 3 sec and stops with no loss of balance, or pivot turns safely within 3 sec and stops with mild imbalance, requires small steps to catch balance.    Step Over Obstacle Is able to step over one shoe box (4.5 in total height) but must slow down and adjust steps to clear box safely. May require verbal cueing.    Gait with Narrow Base of Support Ambulates less than 4 steps heel to toe or cannot perform without assistance.    Gait with Eyes Closed Walks 20 ft, uses assistive device, slower speed, mild gait deviations, deviates 6-10 in outside 12 in walkway width. Ambulates 20 ft in less than 9 sec but greater than 7 sec.    Ambulating Backwards Walks 20 ft, uses assistive device, slower speed, mild gait deviations, deviates 6-10 in outside 12 in walkway width.    Steps Alternating feet, must use rail.    Total Score 17                      Objective measurements completed on examination: See above findings.               PT Education - 08/19/20 1710    Education Details Pt educated on PT plan of care.    Person(s) Educated Patient;Spouse    Methods Explanation    Comprehension Verbalized understanding            PT Short Term Goals - 08/19/20 1717      PT SHORT TERM GOAL #1   Title STGs=LTGs             PT Long Term Goals - 08/19/20 1717      PT LONG TERM GOAL #1   Title Pt will be independent with balance HEP to continued gains on own.    Baseline no current HEP    Time 4    Period Weeks    Status New    Target Date 09/18/20      PT LONG TERM GOAL #2   Title Pt will increase FGA from 17 to >21/30 for improved balance and gait safety.     Baseline 17/30 at eval    Time 4    Period Weeks    Status New    Target Date 09/18/20      PT LONG TERM GOAL #3   Title Pt will increase gait speed from 0.94m/s to >0.11m/s for improved gait safety.    Baseline 0.63m/s at eval    Time 4    Period Weeks    Status New    Target Date 09/18/20                  Plan - 08/19/20 1711    Clinical Impression Statement Pt is 60 y/o male with glioblastoma s/p debulking craniotomy on 08/02/20. Will be starting radiation therapy soon. Per pt report biggest deficit is memory. Does not feel he needs OT eval at this time. Pt's strength was grossly WNL. He did present with higher level balance deficits and was fall risk based on 5 x sit to stand of 19.96 sec and FGA of 17/30. Pt is ambulating with gait speed of 0.68m/s which is safe for community ambulator but decreased from norms. Pt will benefit from skilled PT to address higher level balance deficits. PT will request ST eval.    Examination-Activity Limitations Locomotion Level;Stairs    Examination-Participation Restrictions  Community Activity;Driving;Yard Work    [employer] Evolving/Moderate complexity    Clinical Decision Making Moderate    Rehab Potential Good    PT Frequency 1x / week   plus eval   PT Duration 4 weeks    PT Treatment/Interventions ADLs/Self Care Home Management;Gait training;Stair training;Functional mobility training;Therapeutic activities;Balance training;Therapeutic exercise;Neuromuscular re-education    PT Next Visit Plan Establish initial HEP for higher level balance, gait on varied surfaces.    Recommended Other Services PT requesting ST eval    Consulted and Agree with Plan of Care Patient;Family member/caregiver    Family Member Consulted wife           Patient will benefit from skilled therapeutic intervention in order to improve the following deficits and impairments:  Abnormal gait,Decreased balance  Visit Diagnosis: Other  abnormalities of gait and mobility  Unsteadiness on feet     Problem List Patient Active Problem List   Diagnosis Date Noted  . Glioblastoma ([place]) 08/02/2020  . 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 Hyponatremia 06/10/2020    Electa Sniff, PT, DPT, NCS 08/19/2020, 5:21 PM  [facility] [address] [place], [place], [address] Phone: [phone]   Fax:  [phone]  Name: Therman Trebing MRN: [id number] Date of Birth: 02-17-1961  [facility] Authorization   Choose one: Neuro Rehabilitative  Standardized Assessment or Functional Outcome Tool: FGA and 5x sit to stand  Score or Percent Disability: 17/30 on FGA, 19.96 sec on 5 x sit to stand  Body Parts Treated (Select each separately):  1. N/A. Overall deficits/functional limitations for body part selected: Glioblastoma with balance deficits. 2. N/A. Overall deficits/functional limitations for body part selected: none 3. N/A. Overall deficits/functional limitations for body part selected: none

## 2020-08-18 NOTE — Telephone Encounter (Signed)
PT called Dr. Renda Rolls office to get verbal order to proceed with outpatient PT eval after recent tumor resection as original order came prior to the surgery. Left a message and Dr. Mickeal Skinner called back and gave approval for PT to proceed with eval stating tumor resection went well. Cherly Anderson, PT, DPT, NCS

## 2020-08-19 ENCOUNTER — Telehealth: Payer: Self-pay

## 2020-08-19 DIAGNOSIS — C719 Malignant neoplasm of brain, unspecified: Secondary | ICD-10-CM

## 2020-08-19 NOTE — Telephone Encounter (Signed)
Dr. Mickeal Skinner, Hunter Terry was evaluated by PT on 08/18/20.  The patient would benefit from ST evaluation for memory deficits.   If you agree, please place an order in [facility] workque in [facility] or fax the order to [phone]. Thank you, Cherly Anderson, PT, DPT, [facility] [address] [place], [place]  [address] Phone:  [phone] Fax:  [phone]

## 2020-08-19 NOTE — Addendum Note (Signed)
Addended by: Cherly Anderson A on: 08/19/2020 05:22 PM   Modules accepted: Orders

## 2020-08-25 ENCOUNTER — Ambulatory Visit: Payer: Medicaid Other

## 2020-08-29 ENCOUNTER — Ambulatory Visit
Admission: RE | Admit: 2020-08-29 | Discharge: 2020-08-29 | Disposition: A | Discharge status: Home / Self Care | Payer: Medicaid Other | Source: Ambulatory Visit | Attending: Radiation Oncology | Admitting: Radiation Oncology

## 2020-08-29 ENCOUNTER — Other Ambulatory Visit: Payer: Self-pay

## 2020-08-29 ENCOUNTER — Encounter: Payer: Self-pay | Admitting: Radiation Oncology

## 2020-08-29 VITALS — Ht 65.0 in | Wt 150.0 lb

## 2020-08-29 VITALS — BP 103/65 | HR 52 | Temp 97.9°F | Resp 18 | Ht 65.0 in | Wt 150.2 lb

## 2020-08-29 DIAGNOSIS — C712 Malignant neoplasm of temporal lobe: Secondary | ICD-10-CM | POA: Insufficient documentation

## 2020-08-29 DIAGNOSIS — C7931 Secondary malignant neoplasm of brain: Secondary | ICD-10-CM | POA: Insufficient documentation

## 2020-08-29 DIAGNOSIS — Z510 Encounter for antineoplastic radiation therapy: Secondary | ICD-10-CM | POA: Diagnosis not present

## 2020-08-29 DIAGNOSIS — G40909 Epilepsy, unspecified, not intractable, without status epilepticus: Secondary | ICD-10-CM | POA: Diagnosis not present

## 2020-08-29 DIAGNOSIS — Z79899 Other long term (current) drug therapy: Secondary | ICD-10-CM | POA: Diagnosis not present

## 2020-08-29 MED ORDER — SODIUM CHLORIDE 0.9% FLUSH
10.0000 mL | Freq: Once | INTRAVENOUS | Status: AC
  Administered 2020-08-29: 10 mL via INTRAVENOUS

## 2020-08-29 NOTE — Addendum Note (Signed)
Encounter addended by: Cori Razor, RN on: 08/29/2020 12:53 PM  Actions taken: Pend clinical note

## 2020-08-29 NOTE — Progress Notes (Signed)
Location/Histology of Brain Tumor: left Temporal Glioblastoma   Patient presented with new onset seizure.  Event was decribed as loss of consciousness, sudden, without clarity on further details aside from altered mental status upon awakening.    MRI Brain 08/03/2020: Partial resection of left temporal lobe GBM.  T2 signal changes compatible with residual tumor are again noted in the medial and anterior left temporal lobe along the surgical margins with extension superiorly to the external capsule, not significantly changed.  Stable 1-2 mm of left-to-right midline shift. Minimal extra-axial fluid and dural enhancement subjacent to the craniotomy site. This likely represents postoperative change.  MRI Brain 06/14/2020: As compared to the prior MRI of 06/10/2020, unchanged extent of a large infiltrative T2/FLAIR hyperintense mass involving much of the left temporal lobe. As before, there is involvement of both the cortex and white matter, and of the left hippocampus. Unchanged  extension of signal abnormality into the left temporal stem and into the posterior left subinsular white matter.  Unchanged partial effacement of the left lateral ventricle, mass effect on the left aspect of the midbrain and 2 mm rightward midline shift.  CT CAP 06/10/2020: No evidence of a primary malignancy or metastatic disease within the chest, abdomen or pelvis.  No acute findings within the chest, abdomen or pelvis.  MRI Brain 06/10/2020: Slight worsening of infiltrative tumor of the left temporal lobe, with slight worsening of mass effect on the left lateral ventricle and brainstem  CT Head 06/09/2020: Redemonstration of infiltrative left temporal lesion.  Increased partial effacement of the left lateral ventricle and abutment of the left midbrain. New rightward midline shift of 4 mm.   Past or anticipated interventions, if any, per neurosurgery:  Dr. [name] -Left temporal Craniotomy 08/02/2020  Past or  anticipated interventions, if any, per medical oncology:  Dr. Mickeal Skinner 08/14/2020 We ultimately recommended proceeding with course of intensity modulated radiation therapy and concurrent daily Temozolomide. -Follow-up 08/31/2020  Dose of Decadron, if applicable: Completed Taper  Recent neurologic symptoms, if any:   Seizures: No. Taking keppra, klonopin and dilantin  Headaches: Getting better.  He was having headaches in the morning.  Nausea: No  Dizziness/ataxia: no  Difficulty with hand coordination: No  Focal numbness/weakness: None  Visual deficits/changes: none  Confusion/Memory deficits: impaired short term memory.   SAFETY ISSUES:  Prior radiation? no  Pacemaker/ICD? No  Possible current pregnancy? n/a  Is the patient on methotrexate? No  Additional Complaints / other details:

## 2020-08-29 NOTE — Progress Notes (Signed)
Radiation Oncology         [phone] ________________________________  Name: Hunter Terry        MRN: [id number]  Date of Service: 08/29/2020 DOB: [birth year]  NT:[id number], No Pcp Per  Mickeal Skinner Acey Lav, MD     REFERRING PHYSICIAN: Ventura Sellers, MD   DIAGNOSIS: The encounter diagnosis was Glioblastoma of temporal lobe ([place]).   HISTORY OF PRESENT ILLNESS: Hunter Terry is a 60 y.o. male seen at the request of Dr. Mickeal Skinner for a diagnosis of glioblastoma of the left temporal lobe.  The patient was in his usual state of health until he developed an abrupt onset of seizure activity that he does not recall, he was brought to the emergency department on 06/09/2020 and a CT without contrast of the head revealed increased partial effacement of the left lateral ventricle with new rightward midline shift of 4 mm and infiltrative appearance of the left temporal lobe. MRI with and without contrast on 06/10/2020 showed slightly worsening infiltrative tumor in the left temporal lobe when compared to MRI in May 2021, he underwent CT imaging on 06/10/2020 as well that showed no evidence of primary or metastatic disease and repeat MRI of the brain without contrast on 06/14/2020 showed unchanged partial effacement of the left lateral ventricle with mass-effect on the left aspect of the midbrain and 2 mm rightward midline shift and unchanged large infiltrative hyperintense mass of the left temporal lobe.  He was counseled on the rationale to meet with neurosurgery underwent stereotactic biopsy on 06/15/2020, final pathology from that procedure showed glioblastoma that was IDH wild with TERT promoter mutation detected.  A preop MRI on 07/22/2020 showed infiltrative left temporal mass being unchanged, and he subsequently underwent surgical resection with craniotomy under the care of Dr. [name] on 08/02/2020 final pathology confirmed the previous diagnosis of glioblastoma postoperative MRI on 08/03/2020 showed partial  resection of the left temporal lobe tumor was stable 1 to 2 mm left to right midline shift and extra-axial fluid and dural enhancement subjacent to the craniotomy site consistent with postoperative change.  He has been recovering well and has had his staples removed.  He has had no additional seizures and met with Dr. Mickeal Skinner a few weeks ago to discuss treatment of his cancer.  He is seen today to discuss chemoradiation.     PREVIOUS RADIATION THERAPY: No   PAST MEDICAL HISTORY:  Past Medical History:  Diagnosis Date  . Cancer ([place])    BRAIN TUMOR  . Headache   . Seizure ([place])        PAST SURGICAL HISTORY: Past Surgical History:  Procedure Laterality Date  . APPLICATION OF CRANIAL NAVIGATION N/A 06/15/2020   Procedure: APPLICATION OF CRANIAL NAVIGATION;  Surgeon: [name], MD;  Location: [facility];  Service: Neurosurgery;  Laterality: N/A;  . APPLICATION OF CRANIAL NAVIGATION N/A 08/02/2020   Procedure: APPLICATION OF CRANIAL NAVIGATION;  Surgeon: [name], MD;  Location: [facility];  Service: Neurosurgery;  Laterality: N/A;  . CRANIOTOMY N/A 08/02/2020   Procedure: CRANIOTOMY LEFT TEMPORAL LOBECTOMY FOR TUMOR EXCISION;  Surgeon: [name], MD;  Location: [facility];  Service: Neurosurgery;  Laterality: N/A;  . FRAMELESS  BIOPSY WITH BRAINLAB Left 06/15/2020   Procedure: Left temporal lobe stereotactic brain biopsy with brainlab;  Surgeon: [name], MD;  Location: [facility];  Service: Neurosurgery;  Laterality: Left;     FAMILY HISTORY:  Family History  Problem Relation Age of Onset  . Cancer Neg  Hx      SOCIAL HISTORY:  reports that he has never smoked. He has never used smokeless tobacco. He reports that he does not drink alcohol.  The patient is married and lives in [place].  He is originally from [place] but has been living in the [place] for the last 40 years.  He works for [employer] as a Education officer, community.  His wife is 75 years his junior, and  they have an 3-year-old and year and a half old child.   ALLERGIES: Patient has no known allergies.   MEDICATIONS:  Current Outpatient Medications  Medication Sig Dispense Refill  . Carboxymethylcellul-Glycerin (LUBRICATING EYE DROPS OP) Place 1 drop into both eyes daily as needed (dry eyes).    . clonazePAM (KLONOPIN) 2 MG tablet Take 1 tablet (2 mg total) by mouth 2 (two) times daily. 60 tablet 0  . HYDROcodone-acetaminophen (NORCO/VICODIN) 5-325 MG tablet Take 1 tablet by mouth every 4 (four) hours as needed for moderate pain. 20 tablet 0  . oxymetazoline (AFRIN) 0.05 % nasal spray Place 1 spray into both nostrils 2 (two) times daily as needed for congestion.    . phenytoin (DILANTIN) 100 MG ER capsule Take 1 capsule (100 mg total) by mouth 2 (two) times daily. 60 capsule 11  . levETIRAcetam (KEPPRA) 750 MG tablet Take 1 tablet (750 mg total) by mouth 2 (two) times daily. 60 tablet 0   No current facility-administered medications for this encounter.     REVIEW OF SYSTEMS: On review of systems, the patient reports that he is doing very well overall, he feels like he is able to do most things that he would enjoy, but would really like to get back to driving.  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  He denies any uncontrolled movements, difficulty with gait or instability, he denies any changes in memory or recollection of thoughts, and denies any known episodes of confusion.  He denies any weakness in his extremities.  His previously noted episodes of headache following surgery have improved as well with only tiny episodes of discomfort at the surgical site.  No other complaints are verbalized.      PHYSICAL EXAM:  Wt Readings from Last 3 Encounters:  08/29/20 150 lb (68 kg)  08/29/20 150 lb 3.2 oz (68.1 kg)  08/14/20 150 lb 4.8 oz (68.2 kg)   Temp Readings from Last 3 Encounters:  08/29/20 97.9 F (36.6 C)  08/14/20 (!) 97.4 F (36.3 C) (Tympanic)  08/04/20 98.3 F  (36.8 C) (Oral)   BP Readings from Last 3 Encounters:  08/29/20 103/65  08/14/20 112/65  08/04/20 120/66   Pulse Readings from Last 3 Encounters:  08/29/20 (!) 52  08/14/20 76  08/04/20 89     In general this is a well appearing hispanic male in no acute distress. He's alert and oriented x4 and appropriate throughout the examination. Cardiopulmonary assessment is negative for acute distress and he exhibits normal effort. His incision site over the left temporal scalp area is well healed without cellulitic changes or separation.    ECOG = 1  0 - Asymptomatic (Fully active, able to carry on all predisease activities without restriction)  1 - Symptomatic but completely ambulatory (Restricted in physically strenuous activity but ambulatory and able to carry out work of a light or sedentary nature. For example, light housework, office work)  2 - Symptomatic, <50% in bed during the day (Ambulatory and capable of all self care  but unable to carry out any work activities. Up and about more than 50% of waking hours)  3 - Symptomatic, >50% in bed, but not bedbound (Capable of only limited self-care, confined to bed or chair 50% or more of waking hours)  4 - Bedbound (Completely disabled. Cannot carry on any self-care. Totally confined to bed or chair)  5 - Death   Hunter Terry MM, Creech RH, Tormey DC, et al. [phone]). "Toxicity and response criteria of the [facility]". [place] Oncol. 5 (6): 649-55    LABORATORY DATA:  Lab Results  Component Value Date   WBC 9.3 08/03/2020   HGB 13.6 08/03/2020   HCT 39.6 08/03/2020   MCV 83.0 08/03/2020   PLT 311 08/03/2020   Lab Results  Component Value Date   NA 140 08/03/2020   K 3.9 08/03/2020   CL 106 08/03/2020   CO2 24 08/03/2020   Lab Results  Component Value Date   ALT 23 06/09/2020   AST 30 06/09/2020   ALKPHOS 55 06/09/2020   BILITOT 1.0 06/09/2020      RADIOGRAPHY: MR BRAIN W WO CONTRAST  Result  Date: 08/03/2020 CLINICAL DATA:  Status post biopsy of left temporal lobe mass. IDH wild type GBM. EXAM: MRI HEAD WITHOUT AND WITH CONTRAST TECHNIQUE: Multiplanar, multiecho pulse sequences of the brain and surrounding structures were obtained without and with intravenous contrast. CONTRAST:  7.16m GADAVIST GADOBUTROL 1 MMOL/ML IV SOLN COMPARISON:  MR head without and with contrast 07/22/2020. FINDINGS: Brain: Partial resection of left temporal lobe mass lesion demonstrated. Inferior left temporal lobe resected. Cortical and subcortical T2 signal changes are again noted at the residual anterior left temporal lobe extending superiorly into the external capsule. There is some involvement posterior insular cortex. No significant expansion of T2 signal changes are present. No significant tumor enhancement is present. 1-2 mm of left-to-right midline shift is stable. Right hemisphere is unremarkable. Focal susceptibility in the anterior right frontal lobe likely represents minimal extra-axial hemorrhage. There is also some susceptibility in the left quadrigeminal plate cistern. Minimal extra-axial fluid in focal dural enhancement is present subjacent to the craniotomy site. Vascular: Flow is present in the major intracranial arteries. Skull and upper cervical spine: The craniocervical junction is normal. Upper cervical spine is within normal limits. Marrow signal is unremarkable. Sinuses/Orbits: The paranasal sinuses and mastoid air cells are clear. The globes and orbits are within normal limits. IMPRESSION: 1. Partial resection of left temporal lobe GBM. 2. T2 signal changes compatible with residual tumor are again noted in the medial and anterior left temporal lobe along the surgical margins with extension superiorly to the external capsule, not significantly changed. 3. Stable 1-2 mm of left-to-right midline shift. 4. Minimal extra-axial fluid and dural enhancement subjacent to the craniotomy site. This likely  represents postoperative change. Electronically Signed   By: CSan MorelleM.D.   On: 08/03/2020 02:58       IMPRESSION/PLAN: 1. Glioblastoma of the left temporal lobe.  Dr. MLisbeth Renshawdiscusses the findings from the patient's work-up including his previous imaging tests as well as pathology, and reviewed the rationale for combination of chemoradiation for adjuvant therapy.  The patient has done well since his surgery, and we would anticipate starting treatment within the next 1 to 2 weeks.  He will see Dr. VMickeal Skinnerlater this week as well.  We reviewed the risks, benefits, short and long-term effects of radiotherapy.  The patient is interested in proceeding and written consent is obtained  and signed with a copy presented to the patient.  He will simulate this afternoon with IV contrast and we anticipate his treatment to start on 09/04/2020. 2. Seizure at presentation.  The patient is aware of the need to continue his antiepileptics and will be counseled by Dr. Mickeal Skinner on the duration and dosing of these medications.  He is aware that he should not drive until he is 6 months seizure-free.  In a visit lasting 60 minutes, greater than 50% of the time was spent face to face discussing the patient's condition, in preparation for the discussion, and coordinating the patient's care and the assistance of a Spanish-speaking interpreter was physically present during conversation to assist in communication.  The above documentation reflects my direct findings during this shared patient visit. Please see the separate note by Dr. [name] on this date for the remainder of the patient's plan of care.    Carola Rhine, PAC

## 2020-08-29 NOTE — Progress Notes (Signed)
Has armband been applied?  Yes  Does patient have an allergy to IV contrast dye?: No   Has patient ever received premedication for IV contrast dye?: no  Does patient take metformin?: no  If patient does take metformin when was the last dose: n/a  Date of lab work: 08/03/2020 BUN: 5 CR: 0.74 EGFR: >60   IV site: Left AC  Has IV site been added to flowsheet?  Yes  BP 103/65 (BP Location: Right Arm, Patient Position: Sitting, Cuff Size: Normal)   Pulse (!) 52   Temp 97.9 F (36.6 C)   Resp 18   Ht _0  (1.651 m)   Wt 150 lb 3.2 oz (68.1 kg)   SpO2 100%   BMI 24.99 kg/m

## 2020-08-29 NOTE — Addendum Note (Signed)
Encounter addended by: Wilmon Arms, RN on: 08/29/2020 3:01 PM  Actions taken: LDA properties accepted

## 2020-08-29 NOTE — Addendum Note (Signed)
Encounter addended by: Cori Razor, RN on: 08/29/2020 2:18 PM  Actions taken: LDA properties accepted, Clinical Note Signed, Order list changed, Diagnosis association updated, MAR administration accepted

## 2020-08-31 ENCOUNTER — Other Ambulatory Visit: Payer: Self-pay

## 2020-08-31 ENCOUNTER — Telehealth: Payer: Self-pay | Admitting: Pharmacist

## 2020-08-31 ENCOUNTER — Other Ambulatory Visit: Payer: Self-pay | Admitting: Internal Medicine

## 2020-08-31 ENCOUNTER — Inpatient Hospital Stay (HOSPITAL_BASED_OUTPATIENT_CLINIC_OR_DEPARTMENT_OTHER): Payer: Medicaid Other | Admitting: Internal Medicine

## 2020-08-31 ENCOUNTER — Telehealth: Payer: Self-pay

## 2020-08-31 VITALS — BP 114/69 | HR 66 | Temp 98.1°F | Resp 20 | Ht 65.0 in | Wt 151.7 lb

## 2020-08-31 DIAGNOSIS — C712 Malignant neoplasm of temporal lobe: Secondary | ICD-10-CM | POA: Diagnosis not present

## 2020-08-31 DIAGNOSIS — C719 Malignant neoplasm of brain, unspecified: Secondary | ICD-10-CM

## 2020-08-31 MED ORDER — CLONAZEPAM 1 MG PO TABS: 1.0000 mg | ORAL_TABLET | Freq: Two times a day (BID) | ORAL | 1 refills | Status: DC

## 2020-08-31 MED ORDER — TEMOZOLOMIDE 100 MG PO CAPS: 100.0000 mg | ORAL_CAPSULE | Freq: Every day | ORAL | 0 refills | Status: DC

## 2020-08-31 MED ORDER — TEMOZOLOMIDE 20 MG PO CAPS: 20.0000 mg | ORAL_CAPSULE | Freq: Every day | ORAL | 0 refills | Status: DC

## 2020-08-31 MED ORDER — ONDANSETRON HCL 8 MG PO TABS: 8.0000 mg | ORAL_TABLET | Freq: Two times a day (BID) | ORAL | 1 refills | Status: DC | PRN

## 2020-08-31 MED FILL — ONDANSETRON HCL 8 MG TABLET: 8 | 15 days supply | Qty: 30 | Fill #0

## 2020-08-31 NOTE — Progress Notes (Signed)
[facility] at [place], [place] [address] [phone]   Interval Evaluation  Date of Service: 08/31/20 [name] Patient MRN: [id number] Patient DOB: November 19, 1960 Provider: Ventura Sellers, MD  Identifying Statement:  Hunter Terry is a 60 y.o. 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 [name]; path reveals glioblastoma IDH-wt   08/02/2020 Surgery   [place] craniotomy with Dr. [name]     Biomarkers:  MGMT Unknown.  IDH 1/2 Wild type.  EGFR Unknown  TERT "Mutated   Interval History:  [name] presents today for evaluation and follow up with his daughter.  Denies new or progressive neurologic deficits, no further steroids. No balance issues, motor dysfunction.  No further seizures despite discontinuing Vimpat.  Continues on Keppra, Dilantin, Klonopin as prior.    H+P (07/24/20) Patient presented to medical attention in late October, 2021 with new onset seizure.  Event was decribed as loss of consciousness, sudden, without clarity on further details aside from altered mental status upon awakening.  CNS imaging demonsrated non-enhancing mass within left temporal lobe c/w likely glioma; he underwent stereotactic biopsy with Dr. [name] on 06/15/20.  There was significant delay in finalizing path, explaining delay in follow up and evaluation.  He denies any seizures since discharge from hospital, on 4 anti-seizure drugs.  He does complain of dizziness and drowsiness with the medications, however.  Also describes impaired short term memory.  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    Medications: Current Outpatient Medications on File Prior to Visit   Medication Sig Dispense Refill  . Carboxymethylcellul-Glycerin (LUBRICATING EYE DROPS OP) Place 1 drop into both eyes daily as needed (dry eyes).    . clonazePAM (KLONOPIN) 2 MG tablet Take 1 tablet (2 mg total) by mouth 2 (two) times daily. 60 tablet 0  . HYDROcodone-acetaminophen (NORCO/VICODIN) 5-325 MG tablet Take 1 tablet by mouth every 4 (four) hours as needed for moderate pain. 20 tablet 0  . levETIRAcetam (KEPPRA) 750 MG tablet Take 1 tablet (750 mg total) by mouth 2 (two) times daily. 60 tablet 0  . oxymetazoline (AFRIN) 0.05 % nasal spray Place 1 spray into both nostrils 2 (two) times daily as needed for congestion.    . phenytoin (DILANTIN) 100 MG ER capsule Take 1 capsule (100 mg total) by mouth 2 (two) times daily. 60 capsule 11   No current facility-administered medications on file prior to visit.    Allergies: No Known Allergies Past Medical History:  Past Medical History:  Diagnosis Date  . Cancer ([place])    BRAIN TUMOR  . Headache   . Seizure [facility])    Past Surgical History:  Past Surgical History:  Procedure Laterality Date  . APPLICATION OF CRANIAL NAVIGATION N/A 06/15/2020   Procedure: APPLICATION OF CRANIAL NAVIGATION;  Surgeon: [name], MD;  Location: [facility];  Service: Neurosurgery;  Laterality: N/A;  . APPLICATION OF CRANIAL NAVIGATION N/A 08/02/2020   Procedure: APPLICATION OF CRANIAL NAVIGATION;  Surgeon: [name], MD;  Location: [facility];  Service: Neurosurgery;  Laterality: N/A;  . CRANIOTOMY N/A 08/02/2020   Procedure: CRANIOTOMY LEFT TEMPORAL LOBECTOMY FOR TUMOR EXCISION;  Surgeon: [name], MD;  Location: [facility];  Service: Neurosurgery;  Laterality: N/A;  .  FRAMELESS  BIOPSY WITH BRAINLAB Left 06/15/2020   Procedure: Left temporal lobe stereotactic brain biopsy with brainlab;  Surgeon: [name], MD;  Location: [facility];  Service: Neurosurgery;  Laterality: Left;   Social History:  Social History   Socioeconomic History  .  Marital status: Married    Spouse name: Not on file  . Number of children: Not on file  . Years of education: Not on file  . Highest education level: Not on file  Occupational History  . Not on file  Tobacco Use  . Smoking status: Never Smoker  . Smokeless tobacco: Never Used  Substance and Sexual Activity  . Alcohol use: Never  . Drug use: Not on file  . Sexual activity: Yes    Partners: Female  Other Topics Concern  . Not on file  Social History Narrative  . Not on file   Social Determinants of Health   Financial Resource Strain: Not on file  Food Insecurity: Not on file  Transportation Needs: Not on file  Physical Activity: Not on file  Stress: Not on file  Social Connections: Not on file  Intimate Partner Violence: Not on file   Family History:  Family History  Problem Relation Age of Onset  . Cancer Neg Hx     Review of Systems: Constitutional: Doesn't report fevers, chills or abnormal weight loss Eyes: Doesn't report blurriness of vision Ears, nose, mouth, throat, and face: Doesn't report sore throat Respiratory: Doesn't report cough, dyspnea or wheezes Cardiovascular: Doesn't report palpitation, chest discomfort  Gastrointestinal:  Doesn't report nausea, constipation, diarrhea GU: Doesn't report incontinence Skin: Doesn't report skin rashes Neurological: Per HPI Musculoskeletal: Doesn't report joint pain Behavioral/Psych: Doesn't report anxiety  Physical Exam: Vitals:   08/31/20 1002  BP: 114/69  Pulse: 66  Resp: 20  Temp: 98.1 F (36.7 C)  SpO2: 100%   KPS: 90. General: Alert, cooperative, pleasant, in no acute distress Head: Normal EENT: No conjunctival injection or scleral icterus.  Lungs: Resp effort normal Cardiac: Regular rate Abdomen: Non-distended abdomen Skin: No rashes cyanosis or petechiae. Extremities: No clubbing or edema  Neurologic Exam: Mental Status: Awake, alert, attentive to examiner. Oriented to self and environment.  Language is fluent with intact comprehension.  Cranial Nerves: Visual acuity is grossly normal. Visual fields are full. Extra-ocular movements intact. No ptosis. Face is symmetric Motor: Tone and bulk are normal. Power is full in both arms and legs. Reflexes are symmetric, no pathologic reflexes present.  Sensory: Intact to light touch Gait: Normal.   Labs: I have reviewed the data as listed    Component Value Date/Time   NA 140 08/03/2020 0500   K 3.9 08/03/2020 0500   CL 106 08/03/2020 0500   CO2 24 08/03/2020 0500   GLUCOSE 150 (H) 08/03/2020 0500   BUN 5 (L) 08/03/2020 0500   CREATININE 0.74 08/03/2020 0500   CALCIUM 8.6 (L) 08/03/2020 0500   PROT 7.7 06/09/2020 2055   ALBUMIN 3.6 06/15/2020 0140   AST 30 06/09/2020 2055   ALT 23 06/09/2020 2055   ALKPHOS 55 06/09/2020 2055   BILITOT 1.0 06/09/2020 2055   GFRNONAA >60 08/03/2020 0500   Lab Results  Component Value Date   WBC 9.3 08/03/2020   NEUTROABS 7.8 (H) 08/03/2020   HGB 13.6 08/03/2020   HCT 39.6 08/03/2020   MCV 83.0 08/03/2020   PLT 311 08/03/2020    Assessment/Plan 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  No new or progressive deficits or breakthrough seizure events.  We again extensively discussed pathology and prognosis with him and his wife at bedside with Patent attorney.    We ultimately recommended proceeding with course of intensity modulated radiation therapy and concurrent daily Temozolomide.  Radiation will be administered Mon-Fri over 6 weeks, Temodar will be dosed at 75mg /m2 to be given daily over 42 days.  We reviewed side effects of temodar, including fatigue, nausea/vomiting, constipation, and cytopenias.  Chemotherapy should be held for the following:  ANC less than 1,000  Platelets less than 100,000  LFT or creatinine greater than 2x ULN  If clinical concerns/contraindications develop  Every 2 weeks  during radiation, labs will be checked accompanied by a clinical evaluation in the brain tumor clinic.  Will start down-titrating Klonopin: 1mg /2mg  x1 week, then 1mg /1mg  thereafter.   He will return to clinic during weeks 2, 4 and 6 of radiation with labs for evaluation.   We appreciate the opportunity to participate in the care of [place].    All questions were answered. The patient knows to call the clinic with any problems, questions or concerns. No barriers to learning were detected.  The total time spent in the encounter was 40 minutes and more than 50% was on counseling and review of test results   Ventura Sellers, MD Medical Director of Neuro-Oncology [facility] [place] 08/31/20 9:59 AM

## 2020-08-31 NOTE — Telephone Encounter (Signed)
Oral Oncology Patient Advocate Encounter  After completing a benefits investigation, prior authorization for Temodar is not required at this time through [facility].  Patient's copay is $0.    [place] Patient [place] Phone [phone] Fax [phone] 08/31/2020 1:30 PM

## 2020-08-31 NOTE — Progress Notes (Signed)
START ON PATHWAY REGIMEN - Neuro     One cycle, concurrent with RT:     Temozolomide   **Always confirm dose/schedule in your pharmacy ordering system**  Patient Characteristics: Glioblastoma (Grade IV Glioma), Newly Diagnosed / Treatment Naive, Good Performance Status and/or Younger Patient, MGMT Promoter Unmethylated/Unknown Disease Classification: Glioma Disease Classification: Glioblastoma (Grade IV Glioma) Disease Status: Newly Diagnosed / Treatment Naive Performance Status: Good Performance Status and/or Younger Patient MGMT Promoter Methylation Status: Awaiting Test Results Intent of Therapy: Non-Curative / Palliative Intent, Discussed with Patient 

## 2020-08-31 NOTE — Telephone Encounter (Signed)
Oral Oncology Pharmacist Encounter  Received new prescription for Temodar (temozolomide) for the treatment of glioblastoma in conjunction with radiation, planned duration 42 days.  Prescription dose and frequency assessed for appropriateness. Appropriate for therapy initiation.   CBC w/ Diff and BMP from 08/03/20 assessed, labs stable for treatment initiation.  Current medication list in Epic reviewed, no relevant/significant DDIs with Temodar identified.  Evaluated chart and no patient barriers to medication adherence noted.   Prescription has been e-scribed to the [facility] for benefits analysis and approval.  Oral Oncology Clinic will continue to follow for insurance authorization, copayment issues, initial counseling and start date.  Leron Croak, PharmD, BCPS Hematology/Oncology Clinical Pharmacist [facility] [phone] 08/31/2020 1:34 PM

## 2020-09-01 ENCOUNTER — Ambulatory Visit: Payer: Medicaid Other

## 2020-09-01 DIAGNOSIS — Z510 Encounter for antineoplastic radiation therapy: Secondary | ICD-10-CM | POA: Diagnosis not present

## 2020-09-01 MED FILL — TEMOZOLOMIDE 100 MG CAPS: 100 | 28 days supply | Qty: 28 | Fill #0

## 2020-09-01 MED FILL — TEMOZOLOMIDE 20 MG CAPS: 20 | 28 days supply | Qty: 28 | Fill #0

## 2020-09-01 NOTE — Telephone Encounter (Signed)
Oral Chemotherapy Pharmacist Encounter   Attempted to reach patient to provide update and offer for initial counseling on oral medication: Temodar (temozolomide).   No answer. Unable to leave voicemail due to voicemail box not being set up.   Leron Croak, PharmD, BCPS Hematology/Oncology Clinical Pharmacist [facility] [phone] 09/01/2020 12:38 PM

## 2020-09-01 NOTE — Telephone Encounter (Signed)
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  Patient will likely continue on Temodar for maintenance treatment for 6-12 cycles after completion of concomitant phase.  Counseled on administration, dosing, side effects, monitoring, drug-food interactions, safe handling, storage, and disposal.  Patient will take Temodar 100mg  capsules and Temodar 20mg  capsules, 120 mg total daily dose, by mouth once daily, may take on an empty stomach to decrease nausea and vomiting. If nausea persists, patient may take medication at bedtime to also help decrease nausea and vomiting.   Patient will take Temodar concurrent with radiation for 42 days straight.  Temodar and radiation start date: 09/05/20   Patient will take Zofran 8mg  tablet, 1 tablet by mouth 30-60 min prior to Temodar dose to help decrease N/V once starting adjuvant therapy. Prophylactic Zofran will not be used at initiation of concurrent phase, but will be initiated if nausea develops despite Temodar administration on an empty stomach and at bedtime.   Adverse effects include but are not limited to: nausea, vomiting, anorexia, GI upset, rash, drug fever, and fatigue. Rare but serious adverse effects of pneumocystis pneumonia and secondary malignancy also discussed.  PCP prophylaxis will not be initiated at this time, but may be added based on lymphocyte count in the future.  Reviewed importance of keeping a medication schedule and plan for any missed doses. No barriers to medication adherence identified.  Medication reconciliation performed and medication/allergy list updated.  Insurance authorization for Temodar has been obtained. Test claim at the pharmacy revealed copayment $0 for 1st fill of Temodar. This will ship  from the [facility] on 09/01/20 to deliver to patient's home on 09/04/20.  Patient informed the pharmacy will reach out 5-7 days prior to needing next fill of Temodar to coordinate continued medication acquisition to prevent break in therapy.   All questions answered.  Patient and wife voiced understanding and appreciation.   Medication education handout placed in mail for patient. Patient and wife know to call the office with questions or concerns. Oral Chemotherapy Clinic phone number provided to patient.   Leron Croak, PharmD, BCPS Hematology/Oncology Clinical Pharmacist [facility] [phone] 09/01/2020 2:55 PM

## 2020-09-04 ENCOUNTER — Ambulatory Visit: Payer: Medicaid Other

## 2020-09-05 ENCOUNTER — Ambulatory Visit: Payer: Medicaid Other

## 2020-09-05 ENCOUNTER — Other Ambulatory Visit: Payer: Self-pay

## 2020-09-05 ENCOUNTER — Ambulatory Visit
Admission: RE | Admit: 2020-09-05 | Discharge: 2020-09-05 | Disposition: A | Discharge status: Home / Self Care | Payer: Medicaid Other | Source: Ambulatory Visit | Attending: Radiation Oncology | Admitting: Radiation Oncology

## 2020-09-05 DIAGNOSIS — Z510 Encounter for antineoplastic radiation therapy: Secondary | ICD-10-CM | POA: Diagnosis not present

## 2020-09-06 ENCOUNTER — Ambulatory Visit
Admission: RE | Admit: 2020-09-06 | Discharge: 2020-09-06 | Disposition: A | Discharge status: Home / Self Care | Payer: Medicaid Other | Source: Ambulatory Visit | Attending: Radiation Oncology | Admitting: Radiation Oncology

## 2020-09-06 ENCOUNTER — Other Ambulatory Visit: Payer: Self-pay

## 2020-09-06 DIAGNOSIS — Z510 Encounter for antineoplastic radiation therapy: Secondary | ICD-10-CM | POA: Diagnosis not present

## 2020-09-07 ENCOUNTER — Ambulatory Visit: Payer: Medicaid Other

## 2020-09-07 ENCOUNTER — Ambulatory Visit
Admission: RE | Admit: 2020-09-07 | Discharge: 2020-09-07 | Disposition: A | Discharge status: Home / Self Care | Payer: Medicaid Other | Source: Ambulatory Visit | Attending: Radiation Oncology | Admitting: Radiation Oncology

## 2020-09-07 ENCOUNTER — Other Ambulatory Visit: Payer: Self-pay

## 2020-09-07 ENCOUNTER — Telehealth: Payer: Self-pay | Admitting: Internal Medicine

## 2020-09-07 DIAGNOSIS — Z510 Encounter for antineoplastic radiation therapy: Secondary | ICD-10-CM | POA: Diagnosis not present

## 2020-09-07 NOTE — Progress Notes (Addendum)
Pt here for patient teaching.  Pt given Radiation and You booklet, skin care instructions and Sonafine.  Reviewed areas of pertinence such as fatigue, hair loss, nausea and vomiting, skin changes, headache and blurry vision . Pt able to give teach back of to pat skin and use unscented/gentle soap,apply Sonafine bid and avoid applying anything to skin within 4 hours of treatment. Pt verbalizes understanding of information given and will contact nursing with any questions or concerns.  Interpreter present.   Gloriajean Dell. Leonie Green, BSN

## 2020-09-07 NOTE — Telephone Encounter (Signed)
Release: [id number] Faxed medical records to [facility] @ fax# [phone]

## 2020-09-08 ENCOUNTER — Other Ambulatory Visit: Payer: Self-pay

## 2020-09-08 ENCOUNTER — Ambulatory Visit
Admission: RE | Admit: 2020-09-08 | Discharge: 2020-09-08 | Disposition: A | Discharge status: Home / Self Care | Payer: Medicaid Other | Source: Ambulatory Visit | Attending: Radiation Oncology | Admitting: Radiation Oncology

## 2020-09-08 ENCOUNTER — Ambulatory Visit: Payer: Medicaid Other

## 2020-09-08 DIAGNOSIS — Z510 Encounter for antineoplastic radiation therapy: Secondary | ICD-10-CM | POA: Diagnosis not present

## 2020-09-08 DIAGNOSIS — C719 Malignant neoplasm of brain, unspecified: Secondary | ICD-10-CM

## 2020-09-08 MED ORDER — SONAFINE EX EMUL
1.0000 "application " | Freq: Once | CUTANEOUS | Status: AC
  Administered 2020-09-08: 1 via TOPICAL

## 2020-09-11 ENCOUNTER — Ambulatory Visit
Admission: RE | Admit: 2020-09-11 | Discharge: 2020-09-11 | Disposition: A | Discharge status: Home / Self Care | Payer: Medicaid Other | Source: Ambulatory Visit | Attending: Radiation Oncology | Admitting: Radiation Oncology

## 2020-09-11 ENCOUNTER — Other Ambulatory Visit: Payer: Self-pay

## 2020-09-11 DIAGNOSIS — Z510 Encounter for antineoplastic radiation therapy: Secondary | ICD-10-CM | POA: Diagnosis not present

## 2020-09-12 ENCOUNTER — Ambulatory Visit
Admission: RE | Admit: 2020-09-12 | Discharge: 2020-09-12 | Disposition: A | Discharge status: Home / Self Care | Payer: Medicaid Other | Source: Ambulatory Visit | Attending: Radiation Oncology | Admitting: Radiation Oncology

## 2020-09-12 ENCOUNTER — Other Ambulatory Visit: Payer: Self-pay

## 2020-09-12 DIAGNOSIS — C7931 Secondary malignant neoplasm of brain: Secondary | ICD-10-CM | POA: Insufficient documentation

## 2020-09-12 DIAGNOSIS — C712 Malignant neoplasm of temporal lobe: Secondary | ICD-10-CM | POA: Insufficient documentation

## 2020-09-12 DIAGNOSIS — Z510 Encounter for antineoplastic radiation therapy: Secondary | ICD-10-CM | POA: Diagnosis not present

## 2020-09-13 ENCOUNTER — Other Ambulatory Visit: Payer: Self-pay

## 2020-09-13 ENCOUNTER — Ambulatory Visit
Admission: RE | Admit: 2020-09-13 | Discharge: 2020-09-13 | Disposition: A | Discharge status: Home / Self Care | Payer: Medicaid Other | Source: Ambulatory Visit | Attending: Radiation Oncology | Admitting: Radiation Oncology

## 2020-09-13 DIAGNOSIS — Z510 Encounter for antineoplastic radiation therapy: Secondary | ICD-10-CM | POA: Diagnosis not present

## 2020-09-14 ENCOUNTER — Other Ambulatory Visit: Payer: Self-pay

## 2020-09-14 ENCOUNTER — Ambulatory Visit
Admission: RE | Admit: 2020-09-14 | Discharge: 2020-09-14 | Disposition: A | Discharge status: Home / Self Care | Payer: Medicaid Other | Source: Ambulatory Visit | Attending: Radiation Oncology | Admitting: Radiation Oncology

## 2020-09-14 DIAGNOSIS — Z510 Encounter for antineoplastic radiation therapy: Secondary | ICD-10-CM | POA: Diagnosis not present

## 2020-09-15 ENCOUNTER — Ambulatory Visit
Admission: RE | Admit: 2020-09-15 | Discharge: 2020-09-15 | Disposition: A | Discharge status: Home / Self Care | Payer: Medicaid Other | Source: Ambulatory Visit | Attending: Radiation Oncology | Admitting: Radiation Oncology

## 2020-09-15 ENCOUNTER — Ambulatory Visit: Payer: Medicaid Other

## 2020-09-15 DIAGNOSIS — Z510 Encounter for antineoplastic radiation therapy: Secondary | ICD-10-CM | POA: Diagnosis not present

## 2020-09-18 ENCOUNTER — Ambulatory Visit
Admission: RE | Admit: 2020-09-18 | Discharge: 2020-09-18 | Disposition: A | Discharge status: Home / Self Care | Payer: Medicaid Other | Source: Ambulatory Visit | Attending: Radiation Oncology | Admitting: Radiation Oncology

## 2020-09-18 DIAGNOSIS — Z510 Encounter for antineoplastic radiation therapy: Secondary | ICD-10-CM | POA: Diagnosis not present

## 2020-09-19 ENCOUNTER — Other Ambulatory Visit: Payer: Self-pay

## 2020-09-19 ENCOUNTER — Ambulatory Visit
Admission: RE | Admit: 2020-09-19 | Discharge: 2020-09-19 | Disposition: A | Discharge status: Home / Self Care | Payer: Medicaid Other | Source: Ambulatory Visit | Attending: Radiation Oncology | Admitting: Radiation Oncology

## 2020-09-19 DIAGNOSIS — Z510 Encounter for antineoplastic radiation therapy: Secondary | ICD-10-CM | POA: Diagnosis not present

## 2020-09-20 ENCOUNTER — Ambulatory Visit
Admission: RE | Admit: 2020-09-20 | Discharge: 2020-09-20 | Disposition: A | Discharge status: Home / Self Care | Payer: Medicaid Other | Source: Ambulatory Visit | Attending: Radiation Oncology | Admitting: Radiation Oncology

## 2020-09-20 DIAGNOSIS — Z510 Encounter for antineoplastic radiation therapy: Secondary | ICD-10-CM | POA: Diagnosis not present

## 2020-09-21 ENCOUNTER — Other Ambulatory Visit: Payer: Self-pay

## 2020-09-21 ENCOUNTER — Inpatient Hospital Stay: Payer: Medicaid Other | Attending: Internal Medicine

## 2020-09-21 ENCOUNTER — Ambulatory Visit
Admission: RE | Admit: 2020-09-21 | Discharge: 2020-09-21 | Disposition: A | Discharge status: Home / Self Care | Payer: Medicaid Other | Source: Ambulatory Visit | Attending: Radiation Oncology | Admitting: Radiation Oncology

## 2020-09-21 ENCOUNTER — Inpatient Hospital Stay (HOSPITAL_BASED_OUTPATIENT_CLINIC_OR_DEPARTMENT_OTHER): Payer: Medicaid Other | Admitting: Internal Medicine

## 2020-09-21 ENCOUNTER — Other Ambulatory Visit: Payer: Self-pay | Admitting: Internal Medicine

## 2020-09-21 VITALS — BP 113/72 | HR 64 | Temp 97.7°F | Resp 16 | Ht 65.0 in | Wt 143.4 lb

## 2020-09-21 DIAGNOSIS — Z9221 Personal history of antineoplastic chemotherapy: Secondary | ICD-10-CM | POA: Insufficient documentation

## 2020-09-21 DIAGNOSIS — C719 Malignant neoplasm of brain, unspecified: Secondary | ICD-10-CM | POA: Diagnosis not present

## 2020-09-21 DIAGNOSIS — C712 Malignant neoplasm of temporal lobe: Secondary | ICD-10-CM | POA: Diagnosis present

## 2020-09-21 DIAGNOSIS — Z79899 Other long term (current) drug therapy: Secondary | ICD-10-CM | POA: Insufficient documentation

## 2020-09-21 DIAGNOSIS — Z923 Personal history of irradiation: Secondary | ICD-10-CM | POA: Diagnosis not present

## 2020-09-21 DIAGNOSIS — R569 Unspecified convulsions: Secondary | ICD-10-CM | POA: Diagnosis not present

## 2020-09-21 DIAGNOSIS — Z510 Encounter for antineoplastic radiation therapy: Secondary | ICD-10-CM | POA: Diagnosis not present

## 2020-09-21 LAB — CBC WITH DIFFERENTIAL (CANCER CENTER ONLY)
Abs Immature Granulocytes: 0 10*3/uL (ref 0.00–0.07)
Basophils Absolute: 0 10*3/uL (ref 0.0–0.1)
Basophils Relative: 1 %
Eosinophils Absolute: 0.1 10*3/uL (ref 0.0–0.5)
Eosinophils Relative: 4 %
HCT: 43.2 % (ref 39.0–52.0)
Hemoglobin: 14.6 g/dL (ref 13.0–17.0)
Immature Granulocytes: 0 %
Lymphocytes Relative: 30 %
Lymphs Abs: 1.1 10*3/uL (ref 0.7–4.0)
MCH: 28.3 pg (ref 26.0–34.0)
MCHC: 33.8 g/dL (ref 30.0–36.0)
MCV: 83.7 fL (ref 80.0–100.0)
Monocytes Absolute: 0.4 10*3/uL (ref 0.1–1.0)
Monocytes Relative: 11 %
Neutro Abs: 2.1 10*3/uL (ref 1.7–7.7)
Neutrophils Relative %: 54 %
Platelet Count: 283 10*3/uL (ref 150–400)
RBC: 5.16 MIL/uL (ref 4.22–5.81)
RDW: 13.3 % (ref 11.5–15.5)
WBC Count: 3.7 10*3/uL — ABNORMAL LOW (ref 4.0–10.5)
nRBC: 0 % (ref 0.0–0.2)

## 2020-09-21 LAB — CMP (CANCER CENTER ONLY)
ALT: 19 U/L (ref 0–44)
AST: 17 U/L (ref 15–41)
Albumin: 4.3 g/dL (ref 3.5–5.0)
Alkaline Phosphatase: 93 U/L (ref 38–126)
Anion gap: 5 (ref 5–15)
BUN: <4 mg/dL — ABNORMAL LOW (ref 6–20)
CO2: 32 mmol/L (ref 22–32)
Calcium: 9.1 mg/dL (ref 8.9–10.3)
Chloride: 101 mmol/L (ref 98–111)
Creatinine: 0.79 mg/dL (ref 0.61–1.24)
GFR, Estimated: >60 mL/min (ref >60)
Glucose, Bld: 96 mg/dL (ref 70–99)
Potassium: 4.5 mmol/L (ref 3.5–5.1)
Sodium: 138 mmol/L (ref 135–145)
Total Bilirubin: 0.3 mg/dL (ref 0.3–1.2)
Total Protein: 6.8 g/dL (ref 6.5–8.1)

## 2020-09-21 NOTE — Progress Notes (Signed)
[place] at [place], [place] [address] [phone]   Interval Evaluation  Date of Service: 09/21/20 [name] Patient MRN: [id number] Patient DOB: 1961-01-30 Provider: Ventura Sellers, MD  Identifying Statement:  Hunter Terry is a 60 y.o. 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 [name]; path reveals glioblastoma IDH-wt   08/02/2020 Surgery   Debulking craniotomy with Dr. [name]   Glioblastoma [facility])  08/02/2020 Initial Diagnosis   Glioblastoma ([name])   09/05/2020 -  Chemotherapy    Patient is on Treatment Plan: BRAIN GLIOBLASTOMA RADIATION THERAPY WITH CONCURRENT TEMOZOLOMIDE 75 MG/M2 DAILY FOLLOWED BY SEQUENTIAL MAINTENANCE TEMOZOLOMIDE X 6-12 CYCLES        Biomarkers:  MGMT Unknown.  IDH 1/2 Wild type.  EGFR Unknown  TERT "Mutated   Interval History:  [name] presents today for follow up, now having completed 2 weeks of IMRT and Temodar.  Denies new or progressive neurologic deficits. No balance issues, motor dysfunction.  No breakthrough seizures since prior visit.  Continues on Keppra, Dilantin, Klonopin 1mg  twice per day as prior.    H+P (07/24/20) Patient presented to medical attention in late October, 2021 with new onset seizure.  Event was decribed as loss of consciousness, sudden, without clarity on further details aside from altered mental status upon awakening.  CNS imaging demonsrated non-enhancing mass within left temporal lobe c/w likely glioma; he underwent stereotactic biopsy with Dr. [name] on 06/15/20.  There was significant delay in finalizing path, explaining delay in follow up and evaluation.  He denies any seizures since  discharge from hospital, on 4 anti-seizure drugs.  He does complain of dizziness and drowsiness with the medications, however.  Also describes impaired short term memory.  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    Medications: Current Outpatient Medications on File Prior to Visit  Medication Sig Dispense Refill  . Carboxymethylcellul-Glycerin (LUBRICATING EYE DROPS OP) Place 1 drop into both eyes daily as needed (dry eyes).    . clonazePAM (KLONOPIN) 1 MG tablet Take 1 tablet (1 mg total) by mouth 2 (two) times daily. 90 tablet 1  . HYDROcodone-acetaminophen (NORCO/VICODIN) 5-325 MG tablet Take 1 tablet by mouth every 4 (four) hours as needed for moderate pain. 20 tablet 0  . levETIRAcetam (KEPPRA) 750 MG tablet Take 1 tablet (750 mg total) by mouth 2 (two) times daily. 60 tablet 0  . ondansetron (ZOFRAN) 8 MG tablet Take 1 tablet (8 mg total) by mouth 2 (two) times daily as needed (nausea and vomiting). May take 30-60 minutes prior to Temodar administration if nausea/vomiting occurs. 30 tablet 1  . oxymetazoline (AFRIN) 0.05 % nasal spray Place 1 spray into both nostrils 2 (two) times daily as needed for congestion.    . phenytoin (DILANTIN) 100 MG ER capsule Take 1 capsule (100 mg total) by mouth 2 (two) times daily. 60 capsule 11  . temozolomide (TEMODAR) 100 MG capsule Take 1 capsule (100 mg total) by mouth daily. May take on an empty stomach to decrease nausea & vomiting. 42 capsule 0  . temozolomide (TEMODAR) 20 MG capsule Take 1 capsule (20 mg total) by mouth daily. May take on an empty stomach to decrease nausea & vomiting. 42 capsule 0  No current facility-administered medications on file prior to visit.    Allergies: No Known Allergies Past Medical History:  Past Medical History:  Diagnosis Date  . Cancer (HCC)    BRAIN TUMOR  . Headache   . Seizure [facility])    Past Surgical History:  Past Surgical History:  Procedure Laterality Date  . APPLICATION OF CRANIAL  NAVIGATION N/A 06/15/2020   Procedure: APPLICATION OF CRANIAL NAVIGATION;  Surgeon: Bedelia Person, MD;  Location: [facility] OR;  Service: Neurosurgery;  Laterality: N/A;  . APPLICATION OF CRANIAL NAVIGATION N/A 08/02/2020   Procedure: APPLICATION OF CRANIAL NAVIGATION;  Surgeon: Bedelia Person, MD;  Location: [facility] OR;  Service: Neurosurgery;  Laterality: N/A;  . CRANIOTOMY N/A 08/02/2020   Procedure: CRANIOTOMY LEFT TEMPORAL LOBECTOMY FOR TUMOR EXCISION;  Surgeon: Bedelia Person, MD;  Location: [facility] OR;  Service: Neurosurgery;  Laterality: N/A;  . FRAMELESS  BIOPSY WITH BRAINLAB Left 06/15/2020   Procedure: Left temporal lobe stereotactic brain biopsy with brainlab;  Surgeon: Bedelia Person, MD;  Location: [facility] OR;  Service: Neurosurgery;  Laterality: Left;   Social History:  Social History   Socioeconomic History  . Marital status: Married    Spouse name: Not on file  . Number of children: Not on file  . Years of education: Not on file  . Highest education level: Not on file  Occupational History  . Not on file  Tobacco Use  . Smoking status: Never Smoker  . Smokeless tobacco: Never Used  Substance and Sexual Activity  . Alcohol use: Never  . Drug use: Not on file  . Sexual activity: Yes    Partners: Female  Other Topics Concern  . Not on file  Social History Narrative  . Not on file   Social Determinants of Health   Financial Resource Strain: Not on file  Food Insecurity: Not on file  Transportation Needs: Not on file  Physical Activity: Not on file  Stress: Not on file  Social Connections: Not on file  Intimate Partner Violence: Not on file   Family History:  Family History  Problem Relation Age of Onset  . Cancer Neg Hx     Review of Systems: Constitutional: Doesn't report fevers, chills or abnormal weight loss Eyes: Doesn't report blurriness of vision Ears, nose, mouth, throat, and face: Doesn't report sore throat Respiratory: Doesn't report cough, dyspnea  or wheezes Cardiovascular: Doesn't report palpitation, chest discomfort  Gastrointestinal:  Doesn't report nausea, constipation, diarrhea GU: Doesn't report incontinence Skin: Doesn't report skin rashes Neurological: Per HPI Musculoskeletal: Doesn't report joint pain Behavioral/Psych: Doesn't report anxiety  Physical Exam: Vitals:   09/21/20 1136  BP: 113/72  Pulse: 64  Resp: 16  Temp: 97.7 F (36.5 C)  SpO2: 99%   KPS: 90. General: Alert, cooperative, pleasant, in no acute distress Head: Normal EENT: No conjunctival injection or scleral icterus.  Lungs: Resp effort normal Cardiac: Regular rate Abdomen: Non-distended abdomen Skin: No rashes cyanosis or petechiae. Extremities: No clubbing or edema  Neurologic Exam: Mental Status: Awake, alert, attentive to examiner. Oriented to self and environment. Language is fluent with intact comprehension.  Cranial Nerves: Visual acuity is grossly normal. Visual fields are full. Extra-ocular movements intact. No ptosis. Face is symmetric Motor: Tone and bulk are normal. Power is full in both arms and legs. Reflexes are symmetric, no pathologic reflexes present.  Sensory: Intact to light touch Gait: Normal.   Labs: I have reviewed the data as listed    Component Value  Date/Time   NA 140 08/03/2020 0500   K 3.9 08/03/2020 0500   CL 106 08/03/2020 0500   CO2 24 08/03/2020 0500   GLUCOSE 150 (H) 08/03/2020 0500   BUN 5 (L) 08/03/2020 0500   CREATININE 0.74 08/03/2020 0500   CALCIUM 8.6 (L) 08/03/2020 0500   PROT 7.7 06/09/2020 2055   ALBUMIN 3.6 06/15/2020 0140   AST 30 06/09/2020 2055   ALT 23 06/09/2020 2055   ALKPHOS 55 06/09/2020 2055   BILITOT 1.0 06/09/2020 2055   GFRNONAA >60 08/03/2020 0500   Lab Results  Component Value Date   WBC 3.7 (L) 09/21/2020   NEUTROABS 2.1 09/21/2020   HGB 14.6 09/21/2020   HCT 43.2 09/21/2020   MCV 83.7 09/21/2020   PLT 283 09/21/2020    Assessment/Plan Focal seizures ([place])  [R56.9]  Vidur Bozman is clinically stable today, now having completed 2 weeks of IMRT and Temozolomide.  No new or progressive deficits or breakthrough seizure events.  We ultimately recommended continuing with course of intensity modulated radiation therapy and concurrent daily Temozolomide.  Radiation will be administered Mon-Fri over 6 weeks, Temodar will be dosed at $Remove'75mg'KkpecKU$ /m2 to be given daily over 42 days.    Chemotherapy should be held for the following:  ANC less than 1,000  Platelets less than 100,000  LFT or creatinine greater than 2x ULN  If clinical concerns/contraindications develop  Every 2 weeks during radiation, labs will be checked accompanied by a clinical evaluation in the brain tumor clinic.  Klonopin should be decreased to 0.$RemoveBefore'5mg'VWijtpTsekLeU$  in AM, $Remo'1mg'YwSpf$  in PM.  Keppra will stay at $Remov'750mg'RavgqB$  BID and Dilantin at $RemoveBef'100mg'VgGDslPzEH$  BID.  He will return to clinic during weeks 4 and 6 of radiation with labs for evaluation.   We appreciate the opportunity to participate in the care of [place].    All questions were answered. The patient knows to call the clinic with any problems, questions or concerns. No barriers to learning were detected.  The total time spent in the encounter was 30 minutes and more than 50% was on counseling and review of test results   Ventura Sellers, MD Medical Director of Neuro-Oncology [facility] [place] 09/21/20 11:37 AM

## 2020-09-22 ENCOUNTER — Ambulatory Visit
Admission: RE | Admit: 2020-09-22 | Discharge: 2020-09-22 | Disposition: A | Discharge status: Home / Self Care | Payer: Medicaid Other | Source: Ambulatory Visit | Attending: Radiation Oncology | Admitting: Radiation Oncology

## 2020-09-22 DIAGNOSIS — Z510 Encounter for antineoplastic radiation therapy: Secondary | ICD-10-CM | POA: Diagnosis not present

## 2020-09-25 ENCOUNTER — Other Ambulatory Visit: Payer: Self-pay

## 2020-09-25 ENCOUNTER — Ambulatory Visit
Admission: RE | Admit: 2020-09-25 | Discharge: 2020-09-25 | Disposition: A | Discharge status: Home / Self Care | Payer: Medicaid Other | Source: Ambulatory Visit | Attending: Radiation Oncology | Admitting: Radiation Oncology

## 2020-09-25 DIAGNOSIS — Z510 Encounter for antineoplastic radiation therapy: Secondary | ICD-10-CM | POA: Diagnosis not present

## 2020-09-26 ENCOUNTER — Other Ambulatory Visit: Payer: Self-pay

## 2020-09-26 ENCOUNTER — Telehealth: Payer: Self-pay

## 2020-09-26 ENCOUNTER — Ambulatory Visit
Admission: RE | Admit: 2020-09-26 | Discharge: 2020-09-26 | Disposition: A | Discharge status: Home / Self Care | Payer: Medicaid Other | Source: Ambulatory Visit | Attending: Radiation Oncology | Admitting: Radiation Oncology

## 2020-09-26 ENCOUNTER — Other Ambulatory Visit: Payer: Self-pay | Admitting: Internal Medicine

## 2020-09-26 DIAGNOSIS — Z510 Encounter for antineoplastic radiation therapy: Secondary | ICD-10-CM | POA: Diagnosis not present

## 2020-09-26 MED FILL — TEMOZOLOMIDE 100 MG CAPS: 100 | 14 days supply | Qty: 14 | Fill #1

## 2020-09-26 MED FILL — TEMOZOLOMIDE 20 MG CAPS: 20 | 14 days supply | Qty: 14 | Fill #1

## 2020-09-26 NOTE — Telephone Encounter (Signed)
-----   Message from Patton Salles, RN sent at 09/26/2020 10:54 AM EST ----- Regarding: RE: pt has question about his chemo pill Hunter Terry, Can you call him?  Tammi ----- Message ----- From: Pincus Large Sent: 09/26/2020  10:46 AM EST To: Patton Salles, RN, Wilmon Arms, RN, # Subject: pt has question about his chemo pill           I received a call from the treatment machine that this patient has a question about his chemo pill. Will you please reach out to him?   Pretty please and Thank you! Manuela Schwartz

## 2020-09-26 NOTE — Telephone Encounter (Signed)
I have called the pt and he advised this was addressed and he was advised where to pick his medication up at. No further assistance was needed at this time.

## 2020-09-27 ENCOUNTER — Ambulatory Visit
Admission: RE | Admit: 2020-09-27 | Discharge: 2020-09-27 | Disposition: A | Discharge status: Home / Self Care | Payer: Medicaid Other | Source: Ambulatory Visit | Attending: Radiation Oncology | Admitting: Radiation Oncology

## 2020-09-27 ENCOUNTER — Other Ambulatory Visit: Payer: Self-pay

## 2020-09-27 DIAGNOSIS — Z510 Encounter for antineoplastic radiation therapy: Secondary | ICD-10-CM | POA: Diagnosis not present

## 2020-09-28 ENCOUNTER — Ambulatory Visit
Admission: RE | Admit: 2020-09-28 | Discharge: 2020-09-28 | Disposition: A | Discharge status: Home / Self Care | Payer: Medicaid Other | Source: Ambulatory Visit | Attending: Radiation Oncology | Admitting: Radiation Oncology

## 2020-09-28 ENCOUNTER — Other Ambulatory Visit: Payer: Self-pay

## 2020-09-28 DIAGNOSIS — Z510 Encounter for antineoplastic radiation therapy: Secondary | ICD-10-CM | POA: Diagnosis not present

## 2020-09-29 ENCOUNTER — Other Ambulatory Visit: Payer: Self-pay

## 2020-09-29 ENCOUNTER — Ambulatory Visit
Admission: RE | Admit: 2020-09-29 | Discharge: 2020-09-29 | Disposition: A | Discharge status: Home / Self Care | Payer: Medicaid Other | Source: Ambulatory Visit | Attending: Radiation Oncology | Admitting: Radiation Oncology

## 2020-09-29 DIAGNOSIS — Z510 Encounter for antineoplastic radiation therapy: Secondary | ICD-10-CM | POA: Diagnosis not present

## 2020-10-02 ENCOUNTER — Other Ambulatory Visit: Payer: Self-pay

## 2020-10-02 ENCOUNTER — Ambulatory Visit
Admission: RE | Admit: 2020-10-02 | Discharge: 2020-10-02 | Disposition: A | Discharge status: Home / Self Care | Payer: Medicaid Other | Source: Ambulatory Visit | Attending: Radiation Oncology | Admitting: Radiation Oncology

## 2020-10-02 DIAGNOSIS — Z510 Encounter for antineoplastic radiation therapy: Secondary | ICD-10-CM | POA: Diagnosis not present

## 2020-10-03 ENCOUNTER — Ambulatory Visit
Admission: RE | Admit: 2020-10-03 | Discharge: 2020-10-03 | Disposition: A | Discharge status: Home / Self Care | Payer: Medicaid Other | Source: Ambulatory Visit | Attending: Radiation Oncology | Admitting: Radiation Oncology

## 2020-10-03 ENCOUNTER — Other Ambulatory Visit: Payer: Self-pay

## 2020-10-03 DIAGNOSIS — Z510 Encounter for antineoplastic radiation therapy: Secondary | ICD-10-CM | POA: Diagnosis not present

## 2020-10-04 ENCOUNTER — Ambulatory Visit
Admission: RE | Admit: 2020-10-04 | Discharge: 2020-10-04 | Disposition: A | Discharge status: Home / Self Care | Payer: Medicaid Other | Source: Ambulatory Visit | Attending: Radiation Oncology | Admitting: Radiation Oncology

## 2020-10-04 ENCOUNTER — Other Ambulatory Visit: Payer: Self-pay

## 2020-10-04 DIAGNOSIS — Z510 Encounter for antineoplastic radiation therapy: Secondary | ICD-10-CM | POA: Diagnosis not present

## 2020-10-05 ENCOUNTER — Inpatient Hospital Stay (HOSPITAL_BASED_OUTPATIENT_CLINIC_OR_DEPARTMENT_OTHER): Payer: Medicaid Other | Admitting: Internal Medicine

## 2020-10-05 ENCOUNTER — Ambulatory Visit
Admission: RE | Admit: 2020-10-05 | Discharge: 2020-10-05 | Disposition: A | Discharge status: Home / Self Care | Payer: Medicaid Other | Source: Ambulatory Visit | Attending: Radiation Oncology | Admitting: Radiation Oncology

## 2020-10-05 ENCOUNTER — Other Ambulatory Visit: Payer: Self-pay

## 2020-10-05 ENCOUNTER — Inpatient Hospital Stay: Payer: Medicaid Other

## 2020-10-05 VITALS — BP 104/63 | HR 60 | Temp 97.7°F | Resp 20 | Ht 65.0 in | Wt 141.4 lb

## 2020-10-05 DIAGNOSIS — Z510 Encounter for antineoplastic radiation therapy: Secondary | ICD-10-CM | POA: Diagnosis not present

## 2020-10-05 DIAGNOSIS — C712 Malignant neoplasm of temporal lobe: Secondary | ICD-10-CM | POA: Diagnosis not present

## 2020-10-05 DIAGNOSIS — C719 Malignant neoplasm of brain, unspecified: Secondary | ICD-10-CM

## 2020-10-05 LAB — CBC WITH DIFFERENTIAL (CANCER CENTER ONLY)
Abs Immature Granulocytes: 0.01 10*3/uL (ref 0.00–0.07)
Basophils Absolute: 0 10*3/uL (ref 0.0–0.1)
Basophils Relative: 1 %
Eosinophils Absolute: 0.1 10*3/uL (ref 0.0–0.5)
Eosinophils Relative: 4 %
HCT: 42.7 % (ref 39.0–52.0)
Hemoglobin: 14.5 g/dL (ref 13.0–17.0)
Immature Granulocytes: 0 %
Lymphocytes Relative: 21 %
Lymphs Abs: 0.6 10*3/uL — ABNORMAL LOW (ref 0.7–4.0)
MCH: 28.9 pg (ref 26.0–34.0)
MCHC: 34 g/dL (ref 30.0–36.0)
MCV: 85.1 fL (ref 80.0–100.0)
Monocytes Absolute: 0.3 10*3/uL (ref 0.1–1.0)
Monocytes Relative: 12 %
Neutro Abs: 1.8 10*3/uL (ref 1.7–7.7)
Neutrophils Relative %: 62 %
Platelet Count: 286 10*3/uL (ref 150–400)
RBC: 5.02 MIL/uL (ref 4.22–5.81)
RDW: 13.4 % (ref 11.5–15.5)
WBC Count: 2.8 10*3/uL — ABNORMAL LOW (ref 4.0–10.5)
nRBC: 0 % (ref 0.0–0.2)

## 2020-10-05 LAB — CMP (CANCER CENTER ONLY)
ALT: 16 U/L (ref 0–44)
AST: 15 U/L (ref 15–41)
Albumin: 4.3 g/dL (ref 3.5–5.0)
Alkaline Phosphatase: 84 U/L (ref 38–126)
Anion gap: 6 (ref 5–15)
BUN: 4 mg/dL — ABNORMAL LOW (ref 6–20)
CO2: 30 mmol/L (ref 22–32)
Calcium: 9 mg/dL (ref 8.9–10.3)
Chloride: 100 mmol/L (ref 98–111)
Creatinine: 0.82 mg/dL (ref 0.61–1.24)
GFR, Estimated: >60 mL/min (ref >60)
Glucose, Bld: 90 mg/dL (ref 70–99)
Potassium: 4.6 mmol/L (ref 3.5–5.1)
Sodium: 136 mmol/L (ref 135–145)
Total Bilirubin: 0.3 mg/dL (ref 0.3–1.2)
Total Protein: 6.6 g/dL (ref 6.5–8.1)

## 2020-10-05 NOTE — Progress Notes (Signed)
[place] at [place], [place] [address] [phone]   Interval Evaluation  Date of Service: 10/05/20 [name] Patient MRN: [id number] Patient DOB: [birth year] Provider: Ventura Sellers, MD  Identifying Statement:  Hunter Terry is a 60 y.o. 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 [name]; path reveals glioblastoma IDH-wt   08/02/2020 Surgery   Debulking craniotomy with Dr. [name]   Glioblastoma [facility])  08/02/2020 Initial Diagnosis   Glioblastoma ([place])   09/05/2020 -  Chemotherapy    Patient is on Treatment Plan: BRAIN GLIOBLASTOMA RADIATION THERAPY WITH CONCURRENT TEMOZOLOMIDE 75 MG/M2 DAILY FOLLOWED BY SEQUENTIAL MAINTENANCE TEMOZOLOMIDE X 6-12 CYCLES        Biomarkers:  MGMT Unknown.  IDH 1/2 Wild type.  EGFR Unknown  TERT "Mutated   Interval History:  Din Bookwalter presents today for follow up, now having completed 4 weeks of IMRT and Temodar.  No new or progressive deficits today, tolerating RT and TMZ well. No balance issues, motor dysfunction.  No breakthrough seizures since prior visit.  Continues on Keppra, Dilantin, Klonopin now reduced to 0.88m twice per day.  H+P (07/24/20) Patient presented to medical attention in late October, 2021 with new onset seizure.  Event was decribed as loss of consciousness, sudden, without clarity on further details aside from altered mental status upon awakening.  CNS imaging demonsrated non-enhancing mass within left temporal lobe c/w likely glioma; he underwent stereotactic biopsy with Dr. TMarcello Mooreson 06/15/20.  There was significant delay in finalizing path, explaining delay in follow up and evaluation.  He denies  any seizures since discharge from hospital, on 4 anti-seizure drugs.  He does complain of dizziness and drowsiness with the medications, however.  Also describes impaired short term memory.  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    Medications: Current Outpatient Medications on File Prior to Visit  Medication Sig Dispense Refill  . Carboxymethylcellul-Glycerin (LUBRICATING EYE DROPS OP) Place 1 drop into both eyes daily as needed (dry eyes).    . clonazePAM (KLONOPIN) 1 MG tablet Take 1 tablet (1 mg total) by mouth 2 (two) times daily. 90 tablet 1  . HYDROcodone-acetaminophen (NORCO/VICODIN) 5-325 MG tablet Take 1 tablet by mouth every 4 (four) hours as needed for moderate pain. 20 tablet 0  . levETIRAcetam (KEPPRA) 750 MG tablet Take 1 tablet (750 mg total) by mouth 2 (two) times daily. 60 tablet 0  . ondansetron (ZOFRAN) 8 MG tablet Take 1 tablet (8 mg total) by mouth 2 (two) times daily as needed (nausea and vomiting). May take 30-60 minutes prior to Temodar administration if nausea/vomiting occurs. 30 tablet 1  . oxymetazoline (AFRIN) 0.05 % nasal spray Place 1 spray into both nostrils 2 (two) times daily as needed for congestion.    . phenytoin (DILANTIN) 100 MG ER capsule Take 1 capsule (100 mg total) by mouth 2 (two) times daily. 60 capsule 11  . temozolomide (TEMODAR) 100 MG capsule Take 1 capsule (100 mg total) by mouth daily. May take on an empty stomach to decrease nausea & vomiting. 42 capsule 0  . temozolomide (TEMODAR) 20 MG capsule Take 1 capsule (20 mg total) by mouth daily. May take on an empty stomach to decrease nausea & vomiting. 42  capsule 0   No current facility-administered medications on file prior to visit.    Allergies: No Known Allergies Past Medical History:  Past Medical History:  Diagnosis Date  . Cancer ([place])    BRAIN TUMOR  . Headache   . Seizure [facility])    Past Surgical History:  Past Surgical History:  Procedure Laterality Date  . APPLICATION  OF CRANIAL NAVIGATION N/A 06/15/2020   Procedure: APPLICATION OF CRANIAL NAVIGATION;  Surgeon: [name], MD;  Location: [place];  Service: Neurosurgery;  Laterality: N/A;  . APPLICATION OF CRANIAL NAVIGATION N/A 08/02/2020   Procedure: APPLICATION OF CRANIAL NAVIGATION;  Surgeon: [name], MD;  Location: [place];  Service: Neurosurgery;  Laterality: N/A;  . CRANIOTOMY N/A 08/02/2020   Procedure: CRANIOTOMY LEFT TEMPORAL LOBECTOMY FOR TUMOR EXCISION;  Surgeon: [name], MD;  Location: [place];  Service: Neurosurgery;  Laterality: N/A;  . FRAMELESS  BIOPSY WITH BRAINLAB Left 06/15/2020   Procedure: Left temporal lobe stereotactic brain biopsy with brainlab;  Surgeon: [name], MD;  Location: [place];  Service: Neurosurgery;  Laterality: Left;   Social History:  Social History   Socioeconomic History  . Marital status: Married    Spouse name: Not on file  . Number of children: Not on file  . Years of education: Not on file  . Highest education level: Not on file  Occupational History  . Not on file  Tobacco Use  . Smoking status: Never Smoker  . Smokeless tobacco: Never Used  Substance and Sexual Activity  . Alcohol use: Never  . Drug use: Not on file  . Sexual activity: Yes    Partners: Female  Other Topics Concern  . Not on file  Social History Narrative  . Not on file   Social Determinants of Health   Financial Resource Strain: Not on file  Food Insecurity: Not on file  Transportation Needs: Not on file  Physical Activity: Not on file  Stress: Not on file  Social Connections: Not on file  Intimate Partner Violence: Not on file   Family History:  Family History  Problem Relation Age of Onset  . Cancer Neg Hx     Review of Systems: Constitutional: Doesn't report fevers, chills or abnormal weight loss Eyes: Doesn't report blurriness of vision Ears, nose, mouth, throat, and face: Doesn't report sore throat Respiratory: Doesn't report  cough, dyspnea or wheezes Cardiovascular: Doesn't report palpitation, chest discomfort  Gastrointestinal:  Doesn't report nausea, constipation, diarrhea GU: Doesn't report incontinence Skin: Doesn't report skin rashes Neurological: Per HPI Musculoskeletal: Doesn't report joint pain Behavioral/Psych: Doesn't report anxiety  Physical Exam: Vitals:   10/05/20 1135  BP: 104/63  Pulse: 60  Resp: 20  Temp: 97.7 F (36.5 C)  SpO2: 100%   KPS: 90. General: Alert, cooperative, pleasant, in no acute distress Head: Normal EENT: No conjunctival injection or scleral icterus.  Lungs: Resp effort normal Cardiac: Regular rate Abdomen: Non-distended abdomen Skin: No rashes cyanosis or petechiae. Extremities: No clubbing or edema  Neurologic Exam: Mental Status: Awake, alert, attentive to examiner. Oriented to self and environment. Language is fluent with intact comprehension.  Cranial Nerves: Visual acuity is grossly normal. Visual fields are full. Extra-ocular movements intact. No ptosis. Face is symmetric Motor: Tone and bulk are normal. Power is full in both arms and legs. Reflexes are symmetric, no pathologic reflexes present.  Sensory: Intact to light touch Gait: Normal.   Labs: I have reviewed the data as listed  Component Value Date/Time   NA 136 10/05/2020 1019   K 4.6 10/05/2020 1019   CL 100 10/05/2020 1019   CO2 30 10/05/2020 1019   GLUCOSE 90 10/05/2020 1019   BUN 4 (L) 10/05/2020 1019   CREATININE 0.82 10/05/2020 1019   CALCIUM 9.0 10/05/2020 1019   PROT 6.6 10/05/2020 1019   ALBUMIN 4.3 10/05/2020 1019   AST 15 10/05/2020 1019   ALT 16 10/05/2020 1019   ALKPHOS 84 10/05/2020 1019   BILITOT 0.3 10/05/2020 1019   GFRNONAA >60 10/05/2020 1019   Lab Results  Component Value Date   WBC 2.8 (L) 10/05/2020   NEUTROABS 1.8 10/05/2020   HGB 14.5 10/05/2020   HCT 42.7 10/05/2020   MCV 85.1 10/05/2020   PLT 286 10/05/2020    Assessment/Plan Glioblastoma with  isocitrate dehydrogenase gene wildtype ([place]) [C71.9]  Hunter Terry is clinically stable today, now having completed 4 weeks of IMRT and Temozolomide.    We ultimately recommended continuing with course of intensity modulated radiation therapy and concurrent daily Temozolomide.  Radiation will be administered Mon-Fri over 6 weeks, Temodar will be dosed at 69m/m2 to be given daily over 42 days.    Chemotherapy should be held for the following:  ANC less than 1,000  Platelets less than 100,000  LFT or creatinine greater than 2x ULN  If clinical concerns/contraindications develop  Every 2 weeks during radiation, labs will be checked accompanied by a clinical evaluation in the brain tumor clinic.  We recommended decreasing Klonopin to 0.563mHS.  Keppra will stay at 75065mID and Dilantin at 100m13mD.  We ask that Eucl[place] Huntsmanurn to clinic in 6 weeks following post-RT brain MRI, or sooner as needed.  We appreciate the opportunity to participate in the care of [place] All questions were answered. The patient knows to call the clinic with any problems, questions or concerns. No barriers to learning were detected.  The total time spent in the encounter was 30 minutes and more than 50% was on counseling and review of test results   ZachVentura Sellers Medical Director of Neuro-Oncology [facility] Pa[facility]eslChicken24/22 11:51 AM

## 2020-10-06 ENCOUNTER — Other Ambulatory Visit: Payer: Self-pay

## 2020-10-06 ENCOUNTER — Ambulatory Visit
Admission: RE | Admit: 2020-10-06 | Discharge: 2020-10-06 | Disposition: A | Discharge status: Home / Self Care | Payer: Medicaid Other | Source: Ambulatory Visit | Attending: Radiation Oncology | Admitting: Radiation Oncology

## 2020-10-06 DIAGNOSIS — Z510 Encounter for antineoplastic radiation therapy: Secondary | ICD-10-CM | POA: Diagnosis not present

## 2020-10-09 ENCOUNTER — Ambulatory Visit
Admission: RE | Admit: 2020-10-09 | Discharge: 2020-10-09 | Disposition: A | Discharge status: Home / Self Care | Payer: Medicaid Other | Source: Ambulatory Visit | Attending: Radiation Oncology | Admitting: Radiation Oncology

## 2020-10-09 ENCOUNTER — Other Ambulatory Visit: Payer: Self-pay

## 2020-10-09 DIAGNOSIS — Z510 Encounter for antineoplastic radiation therapy: Secondary | ICD-10-CM | POA: Diagnosis not present

## 2020-10-10 ENCOUNTER — Ambulatory Visit: Payer: Medicaid Other

## 2020-10-11 ENCOUNTER — Other Ambulatory Visit: Payer: Self-pay

## 2020-10-11 ENCOUNTER — Ambulatory Visit
Admission: RE | Admit: 2020-10-11 | Discharge: 2020-10-11 | Disposition: A | Discharge status: Home / Self Care | Payer: Medicaid Other | Source: Ambulatory Visit | Attending: Radiation Oncology | Admitting: Radiation Oncology

## 2020-10-11 DIAGNOSIS — Z510 Encounter for antineoplastic radiation therapy: Secondary | ICD-10-CM | POA: Diagnosis present

## 2020-10-11 DIAGNOSIS — C712 Malignant neoplasm of temporal lobe: Secondary | ICD-10-CM | POA: Diagnosis not present

## 2020-10-11 DIAGNOSIS — C7931 Secondary malignant neoplasm of brain: Secondary | ICD-10-CM | POA: Insufficient documentation

## 2020-10-12 ENCOUNTER — Other Ambulatory Visit: Payer: Self-pay

## 2020-10-12 ENCOUNTER — Ambulatory Visit
Admission: RE | Admit: 2020-10-12 | Discharge: 2020-10-12 | Disposition: A | Discharge status: Home / Self Care | Payer: Medicaid Other | Source: Ambulatory Visit | Attending: Radiation Oncology | Admitting: Radiation Oncology

## 2020-10-12 DIAGNOSIS — Z510 Encounter for antineoplastic radiation therapy: Secondary | ICD-10-CM | POA: Diagnosis not present

## 2020-10-13 ENCOUNTER — Ambulatory Visit
Admission: RE | Admit: 2020-10-13 | Discharge: 2020-10-13 | Disposition: A | Discharge status: Home / Self Care | Payer: Medicaid Other | Source: Ambulatory Visit | Attending: Radiation Oncology | Admitting: Radiation Oncology

## 2020-10-13 ENCOUNTER — Other Ambulatory Visit: Payer: Self-pay

## 2020-10-13 DIAGNOSIS — Z510 Encounter for antineoplastic radiation therapy: Secondary | ICD-10-CM | POA: Diagnosis not present

## 2020-10-16 ENCOUNTER — Ambulatory Visit: Payer: Medicaid Other

## 2020-10-16 ENCOUNTER — Ambulatory Visit
Admission: RE | Admit: 2020-10-16 | Discharge: 2020-10-16 | Disposition: A | Discharge status: Home / Self Care | Payer: Medicaid Other | Source: Ambulatory Visit | Attending: Radiation Oncology | Admitting: Radiation Oncology

## 2020-10-16 ENCOUNTER — Other Ambulatory Visit: Payer: Self-pay

## 2020-10-16 DIAGNOSIS — Z510 Encounter for antineoplastic radiation therapy: Secondary | ICD-10-CM | POA: Diagnosis not present

## 2020-10-17 ENCOUNTER — Ambulatory Visit
Admission: RE | Admit: 2020-10-17 | Discharge: 2020-10-17 | Disposition: A | Discharge status: Home / Self Care | Payer: Medicaid Other | Source: Ambulatory Visit | Attending: Radiation Oncology | Admitting: Radiation Oncology

## 2020-10-17 ENCOUNTER — Other Ambulatory Visit: Payer: Self-pay

## 2020-10-17 ENCOUNTER — Encounter: Payer: Self-pay | Admitting: Radiation Oncology

## 2020-10-17 DIAGNOSIS — Z510 Encounter for antineoplastic radiation therapy: Secondary | ICD-10-CM | POA: Diagnosis not present

## 2020-10-19 ENCOUNTER — Inpatient Hospital Stay: Payer: Medicaid Other | Attending: Internal Medicine

## 2020-10-19 ENCOUNTER — Other Ambulatory Visit: Payer: Self-pay

## 2020-10-19 ENCOUNTER — Encounter: Payer: Self-pay | Admitting: Radiation Oncology

## 2020-10-19 ENCOUNTER — Inpatient Hospital Stay (HOSPITAL_BASED_OUTPATIENT_CLINIC_OR_DEPARTMENT_OTHER): Payer: Medicaid Other | Admitting: Internal Medicine

## 2020-10-19 VITALS — BP 112/71 | HR 72 | Temp 97.6°F | Resp 16 | Ht 65.0 in | Wt 139.4 lb

## 2020-10-19 DIAGNOSIS — C712 Malignant neoplasm of temporal lobe: Secondary | ICD-10-CM | POA: Insufficient documentation

## 2020-10-19 DIAGNOSIS — R569 Unspecified convulsions: Secondary | ICD-10-CM

## 2020-10-19 DIAGNOSIS — C719 Malignant neoplasm of brain, unspecified: Secondary | ICD-10-CM

## 2020-10-19 DIAGNOSIS — Z9221 Personal history of antineoplastic chemotherapy: Secondary | ICD-10-CM | POA: Diagnosis not present

## 2020-10-19 DIAGNOSIS — Z923 Personal history of irradiation: Secondary | ICD-10-CM | POA: Insufficient documentation

## 2020-10-19 DIAGNOSIS — Z79899 Other long term (current) drug therapy: Secondary | ICD-10-CM | POA: Diagnosis not present

## 2020-10-19 LAB — CMP (CANCER CENTER ONLY)
ALT: 26 U/L (ref 0–44)
AST: 17 U/L (ref 15–41)
Albumin: 4.3 g/dL (ref 3.5–5.0)
Alkaline Phosphatase: 91 U/L (ref 38–126)
Anion gap: 5 (ref 5–15)
BUN: 5 mg/dL — ABNORMAL LOW (ref 6–20)
CO2: 29 mmol/L (ref 22–32)
Calcium: 8.9 mg/dL (ref 8.9–10.3)
Chloride: 98 mmol/L (ref 98–111)
Creatinine: 0.79 mg/dL (ref 0.61–1.24)
GFR, Estimated: >60 mL/min (ref >60)
Glucose, Bld: 120 mg/dL — ABNORMAL HIGH (ref 70–99)
Potassium: 4 mmol/L (ref 3.5–5.1)
Sodium: 132 mmol/L — ABNORMAL LOW (ref 135–145)
Total Bilirubin: 0.3 mg/dL (ref 0.3–1.2)
Total Protein: 6.7 g/dL (ref 6.5–8.1)

## 2020-10-19 LAB — CBC WITH DIFFERENTIAL (CANCER CENTER ONLY)
Abs Immature Granulocytes: 0.01 10*3/uL (ref 0.00–0.07)
Basophils Absolute: 0 10*3/uL (ref 0.0–0.1)
Basophils Relative: 0 %
Eosinophils Absolute: 0.1 10*3/uL (ref 0.0–0.5)
Eosinophils Relative: 3 %
HCT: 41.2 % (ref 39.0–52.0)
Hemoglobin: 14.1 g/dL (ref 13.0–17.0)
Immature Granulocytes: 0 %
Lymphocytes Relative: 14 %
Lymphs Abs: 0.4 10*3/uL — ABNORMAL LOW (ref 0.7–4.0)
MCH: 28.5 pg (ref 26.0–34.0)
MCHC: 34.2 g/dL (ref 30.0–36.0)
MCV: 83.4 fL (ref 80.0–100.0)
Monocytes Absolute: 0.3 10*3/uL (ref 0.1–1.0)
Monocytes Relative: 11 %
Neutro Abs: 2.2 10*3/uL (ref 1.7–7.7)
Neutrophils Relative %: 72 %
Platelet Count: 259 10*3/uL (ref 150–400)
RBC: 4.94 MIL/uL (ref 4.22–5.81)
RDW: 13.3 % (ref 11.5–15.5)
WBC Count: 3.1 10*3/uL — ABNORMAL LOW (ref 4.0–10.5)
nRBC: 0 % (ref 0.0–0.2)

## 2020-10-19 NOTE — Progress Notes (Signed)
[facility] [place], [place] [address] [phone]   Interval Evaluation  Date of Service: 10/19/20 [name] Patient MRN: [id number] Patient DOB: [birth year] Provider: Ventura Sellers, MD  Identifying Statement:  Hunter Terry is a 60 y.o. 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 [name]; path reveals glioblastoma IDH-wt   08/02/2020 Surgery   Debulking craniotomy with Dr. [name]   Glioblastoma [facility])  08/02/2020 Initial Diagnosis   Glioblastoma ([place])   09/05/2020 -  Chemotherapy    Patient is on Treatment Plan: BRAIN GLIOBLASTOMA RADIATION THERAPY WITH CONCURRENT TEMOZOLOMIDE 75 MG/M2 DAILY FOLLOWED BY SEQUENTIAL MAINTENANCE TEMOZOLOMIDE X 6-12 CYCLES        Biomarkers:  MGMT Unknown.  IDH 1/2 Wild type.  EGFR Unknown  TERT "Mutated   Interval History:  Hunter Terry presents today for follow up, now having completed 6 weeks of IMRT and Temodar.  Denies new or progressive deficits. No balance issues, motor dysfunction.  No breakthrough seizures since prior visit.  Continues on Keppra, Dilantin, Klonopin now reduced to 0.$RemoveBefo'5mg'wgsLnOMoKco$  just in the evening.  H+P (07/24/20) Patient presented to medical attention in late October, 2021 with new onset seizure.  Event was decribed as loss of consciousness, sudden, without clarity on further details aside from altered mental status upon awakening.  CNS imaging demonsrated non-enhancing mass within left temporal lobe c/w likely glioma; he underwent stereotactic biopsy with Dr. [name] on 06/15/20.  There was significant delay in finalizing path, explaining delay in follow up and evaluation.  He denies any seizures since  discharge from hospital, on 4 anti-seizure drugs.  He does complain of dizziness and drowsiness with the medications, however.  Also describes impaired short term memory.  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    Medications: Current Outpatient Medications on File Prior to Visit  Medication Sig Dispense Refill  . Carboxymethylcellul-Glycerin (LUBRICATING EYE DROPS OP) Place 1 drop into both eyes daily as needed (dry eyes).    . clonazePAM (KLONOPIN) 1 MG tablet Take 1 tablet (1 mg total) by mouth 2 (two) times daily. 90 tablet 1  . HYDROcodone-acetaminophen (NORCO/VICODIN) 5-325 MG tablet Take 1 tablet by mouth every 4 (four) hours as needed for moderate pain. 20 tablet 0  . levETIRAcetam (KEPPRA) 750 MG tablet Take 1 tablet (750 mg total) by mouth 2 (two) times daily. 60 tablet 0  . ondansetron (ZOFRAN) 8 MG tablet Take 1 tablet (8 mg total) by mouth 2 (two) times daily as needed (nausea and vomiting). May take 30-60 minutes prior to Temodar administration if nausea/vomiting occurs. 30 tablet 1  . oxymetazoline (AFRIN) 0.05 % nasal spray Place 1 spray into both nostrils 2 (two) times daily as needed for congestion.    . phenytoin (DILANTIN) 100 MG ER capsule Take 1 capsule (100 mg total) by mouth 2 (two) times daily. 60 capsule 11  . temozolomide (TEMODAR) 100 MG capsule Take 1 capsule (100 mg total) by mouth daily. May take on an empty stomach to decrease nausea & vomiting. 42 capsule 0  . temozolomide (TEMODAR) 20 MG capsule Take 1 capsule (20 mg total) by mouth daily. May take on an empty stomach to decrease nausea & vomiting. 42 capsule 0   No  current facility-administered medications on file prior to visit.    Allergies: No Known Allergies Past Medical History:  Past Medical History:  Diagnosis Date  . Cancer (HCC)    BRAIN TUMOR  . Headache   . Seizure [facility])    Past Surgical History:  Past Surgical History:  Procedure Laterality Date  . APPLICATION OF CRANIAL  NAVIGATION N/A 06/15/2020   Procedure: APPLICATION OF CRANIAL NAVIGATION;  Surgeon: Bedelia Person, MD;  [facility];  Service: Neurosurgery;  Laterality: N/A;  . APPLICATION OF CRANIAL NAVIGATION N/A 08/02/2020   Procedure: APPLICATION OF CRANIAL NAVIGATION;  Surgeon: Bedelia Person, MD;  Location: [facility] OR;  Service: Neurosurgery;  Laterality: N/A;  . CRANIOTOMY N/A 08/02/2020   Procedure: CRANIOTOMY LEFT TEMPORAL LOBECTOMY FOR TUMOR EXCISION;  Surgeon: Bedelia Person, MD;  Location: [facility] OR;  Service: Neurosurgery;  Laterality: N/A;  . FRAMELESS  BIOPSY WITH BRAINLAB Left 06/15/2020   Procedure: Left temporal lobe stereotactic brain biopsy with brainlab;  Surgeon: Bedelia Person, MD;  Location: [facility] OR;  Service: Neurosurgery;  Laterality: Left;   Social History:  Social History   Socioeconomic History  . Marital status: Married    Spouse name: Not on file  . Number of children: Not on file  . Years of education: Not on file  . Highest education level: Not on file  Occupational History  . Not on file  Tobacco Use  . Smoking status: Never Smoker  . Smokeless tobacco: Never Used  Substance and Sexual Activity  . Alcohol use: Never  . Drug use: Not on file  . Sexual activity: Yes    Partners: Female  Other Topics Concern  . Not on file  Social History Narrative  . Not on file   Social Determinants of Health   Financial Resource Strain: Not on file  Food Insecurity: Not on file  Transportation Needs: Not on file  Physical Activity: Not on file  Stress: Not on file  Social Connections: Not on file  Intimate Partner Violence: Not on file   Family History:  Family History  Problem Relation Age of Onset  . Cancer Neg Hx     Review of Systems: Constitutional: Doesn't report fevers, chills or abnormal weight loss Eyes: Doesn't report blurriness of vision Ears, nose, mouth, throat, and face: Doesn't report sore throat Respiratory: Doesn't report cough, dyspnea  or wheezes Cardiovascular: Doesn't report palpitation, chest discomfort  Gastrointestinal:  Doesn't report nausea, constipation, diarrhea GU: Doesn't report incontinence Skin: Doesn't report skin rashes Neurological: Per HPI Musculoskeletal: Doesn't report joint pain Behavioral/Psych: Doesn't report anxiety  Physical Exam: Vitals:   10/19/20 0953  BP: 112/71  Pulse: 72  Resp: 16  Temp: 97.6 F (36.4 C)  SpO2: 100%   KPS: 90. General: Alert, cooperative, pleasant, in no acute distress Head: Normal EENT: No conjunctival injection or scleral icterus.  Lungs: Resp effort normal Cardiac: Regular rate Abdomen: Non-distended abdomen Skin: No rashes cyanosis or petechiae. Extremities: No clubbing or edema  Neurologic Exam: Mental Status: Awake, alert, attentive to examiner. Oriented to self and environment. Language is fluent with intact comprehension.  Cranial Nerves: Visual acuity is grossly normal. Visual fields are full. Extra-ocular movements intact. No ptosis. Face is symmetric Motor: Tone and bulk are normal. Power is full in both arms and legs. Reflexes are symmetric, no pathologic reflexes present.  Sensory: Intact to light touch Gait: Normal.   Labs: I have reviewed the data as listed    Component Value Date/Time  NA 136 10/05/2020 1019   K 4.6 10/05/2020 1019   CL 100 10/05/2020 1019   CO2 30 10/05/2020 1019   GLUCOSE 90 10/05/2020 1019   BUN 4 (L) 10/05/2020 1019   CREATININE 0.82 10/05/2020 1019   CALCIUM 9.0 10/05/2020 1019   PROT 6.6 10/05/2020 1019   ALBUMIN 4.3 10/05/2020 1019   AST 15 10/05/2020 1019   ALT 16 10/05/2020 1019   ALKPHOS 84 10/05/2020 1019   BILITOT 0.3 10/05/2020 1019   GFRNONAA >60 10/05/2020 1019   Lab Results  Component Value Date   WBC 3.1 (L) 10/19/2020   NEUTROABS 2.2 10/19/2020   HGB 14.1 10/19/2020   HCT 41.2 10/19/2020   MCV 83.4 10/19/2020   PLT 259 10/19/2020    Assessment/Plan Glioblastoma with isocitrate  dehydrogenase gene wildtype ([place]) [C71.9]  Hunter Terry is clinically stable today, now having completed 6 weeks of IMRT and Temozolomide.  He has maintained functional status and performance status throughout treatment.  Labs are again within normal limits.  We recommended formally discontinuing Klonopin at this time.  Keppra will stay at $Remov'750mg'YoaUAx$  BID and Dilantin at $RemoveBef'100mg'vHGjYUSkGX$  BID, though weaning of Dilantin can be considered in near future if remains seizure free.  We ask that Hunter Terry return to clinic following post-RT brain MRI next month, or sooner as needed.  We appreciate the opportunity to participate in the care of Hunter Terry.    All questions were answered. The patient knows to call the clinic with any problems, questions or concerns. No barriers to learning were detected.  The total time spent in the encounter was 30 minutes and more than 50% was on counseling and review of test results   Ventura Sellers, MD Medical Director of Neuro-Oncology [facility] [place] 10/19/20 9:56 AM

## 2020-10-19 NOTE — Progress Notes (Signed)
  [name] MRN: [id number] DOB: 12-Jun-1961 Referring Physician: Cecil Cobbs Date of Service: 10/17/2020 [facility] [place]                                                        End Of Treatment Note  Diagnoses: C71.2-Malignant neoplasm of temporal lobe  Cancer Staging:Glioblastoma of the left temporal lobe  Intent: Curative  Radiation Treatment Dates: 09/05/2020 through 10/17/2020 Site Technique Total Dose (Gy) Dose per Fx (Gy) Completed Fx Beam Energies  Brain: Brain IMRT 46/46 2 23/23 6X  Brain: Brain_Bst IMRT 14/14 2 7/7 6X   Narrative: The patient tolerated radiation therapy relatively well with mild changes of the skin in the treatment field.  Plan: The patient will receive a call in about one month from the radiation oncology department. He will continue follow up with Dr. Hollie Salk as well.  ________________________________________________    Carola Rhine, [facility]

## 2020-11-01 ENCOUNTER — Telehealth: Payer: Self-pay

## 2020-11-01 NOTE — Telephone Encounter (Signed)
Nutrition Assessment   Reason for Assessment: patient identified on Malnutrition Screening report for weight loss    ASSESSMENT:  60 year old male with left temporal glioblastoma.  S/p craniotomy 08/02/2020 and 6 weeks of IMRT.  Patient on temodar.  Spoke with patient via phone and introduced self and service at [facility].  Patient reports that his appetite is good. Limiting foods like corn tortillas, beans, cow's milk switched to almond milk.  Says that people have told his wife to avoid these foods.  Eats chicken, fish and eggs and fruits and vegetables.   Denies nutrition impact symptoms   Medications: reviewed   Labs: reviewed   Anthropometrics:   Height: 65 inches Weight: 139 lb on 3/10 UBW: 151 lb Jan. 20th BMI: 23  8% weight loss in the last 2 months, signfiicant   Estimated Energy Needs  Kcals: 1575-1800 Protein: 78-90 g Fluid: 1.5 L   NUTRITION DIAGNOSIS: Inadequate oral intake related to self-limiting foods as evidenced by 8% weight loss in the last 2 months   INTERVENTION:  Encouraged increasing volume of foods with good appetite to increase calories and protein Discussed that low carb diet/ketogenic diet is being studied but not currently the standard of care for brain tumors.  Discussed importance of maintaining nutrition/weight and strength during treatment.   Contact information provided   MONITORING, EVALUATION, GOAL: weight trends, intake   Next Visit: phone call in ~ 4 weeks  Marybell Robards B. Zenia Resides, [place], [place] Registered Dietitian [phone] (mobile)

## 2020-11-08 ENCOUNTER — Other Ambulatory Visit: Payer: Self-pay | Admitting: Radiation Therapy

## 2020-11-09 ENCOUNTER — Other Ambulatory Visit (HOSPITAL_COMMUNITY): Payer: Self-pay

## 2020-11-13 ENCOUNTER — Other Ambulatory Visit: Payer: Self-pay

## 2020-11-13 ENCOUNTER — Ambulatory Visit (HOSPITAL_COMMUNITY)
Admission: RE | Admit: 2020-11-13 | Discharge: 2020-11-13 | Disposition: A | Discharge status: Home / Self Care | Payer: Medicaid Other | Source: Ambulatory Visit | Attending: Internal Medicine | Admitting: Internal Medicine

## 2020-11-13 DIAGNOSIS — C719 Malignant neoplasm of brain, unspecified: Secondary | ICD-10-CM | POA: Insufficient documentation

## 2020-11-13 IMAGING — MR MR HEAD WO/W CM
9 of 13 series · 34 of 48 positions shown · IV contrast (gadavist)
Comparison: [DATE]

CLINICAL DATA: Glioblastoma post debulking [DATE], currently
on standard treatment

EXAM:
MRI HEAD WITHOUT AND WITH CONTRAST
TECHNIQUE: Multiplanar, multiecho pulse sequences of the brain and surrounding
structures were obtained without and with intravenous contrast.
CONTRAST:  6.5mL GADAVIST GADOBUTROL 1 MMOL/ML IV SOLN

[Series 3: DWI · axial · 3.0mm · 1.09mm/px · z∈[-58,+85]mm · 9 of 100 slices shown (1 of 4)]
[im 1/100]
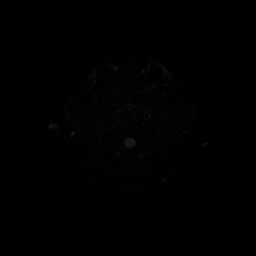
[im 13/100]
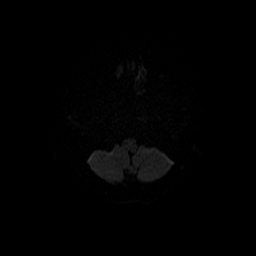
[im 25/100]
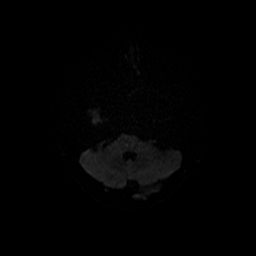
[im 38/100]
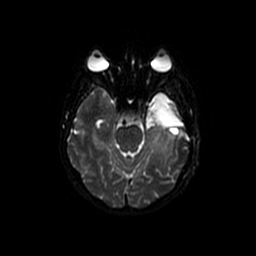
[im 50/100]
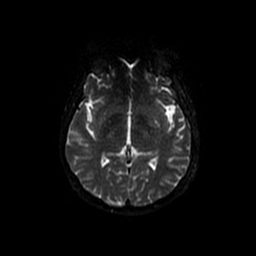
[im 62/100]
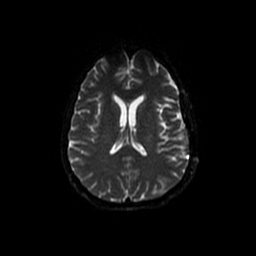
[im 75/100]
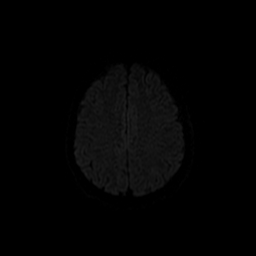
[im 87/100]
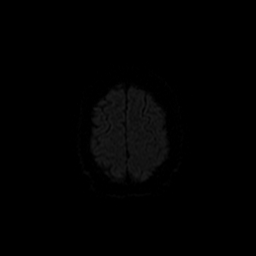
[im 100/100]
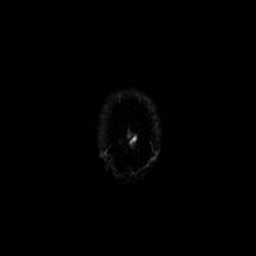

[Series 4: DWI · coronal · 5.0mm · 1.09mm/px · 6 of 70 slices shown (2 of 4)]
[im 1/70]
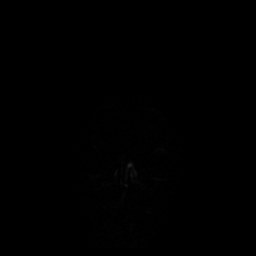
[im 14/70]
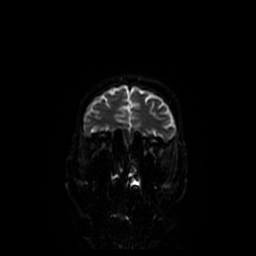
[im 28/70]
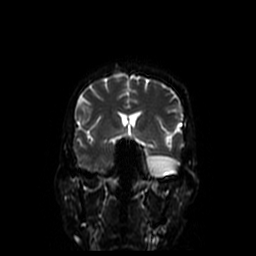
[im 42/70]
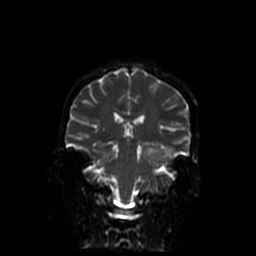
[im 56/70]
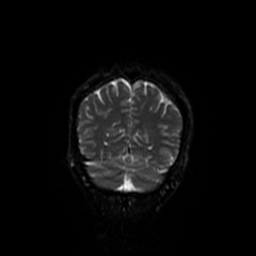
[im 70/70]
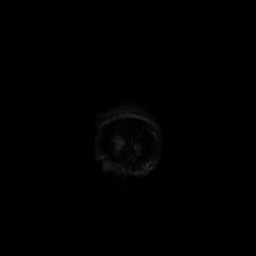

[Series 6: T2 · axial · 5.0mm · 0.43mm/px · z∈[-58,+94]mm · 2 of 27 slices shown]
[im 1/27]
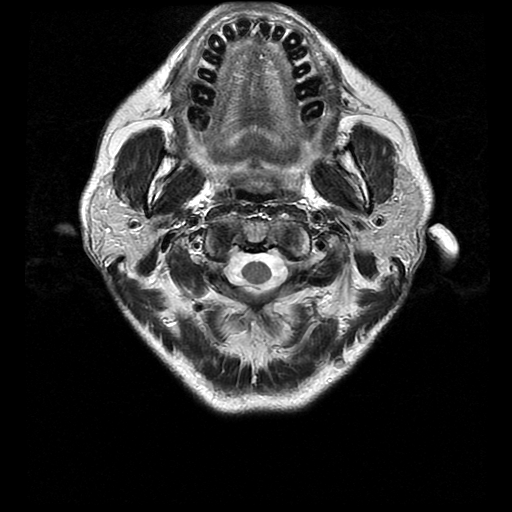
[im 27/27]
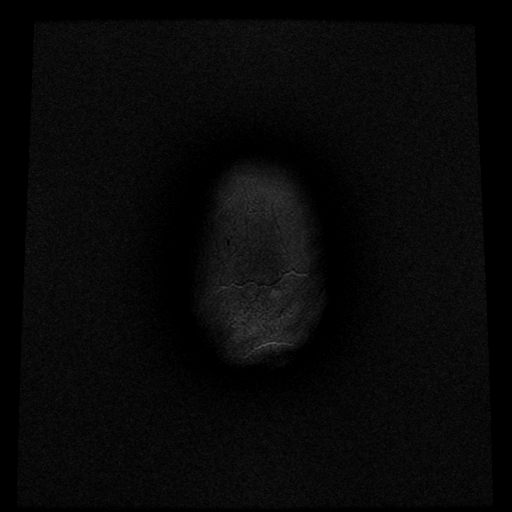

[Series 7: FLAIR · axial · 3.0mm · 0.43mm/px · z∈[-57,+89]mm · 2 of 26 slices shown]
[im 1/26]
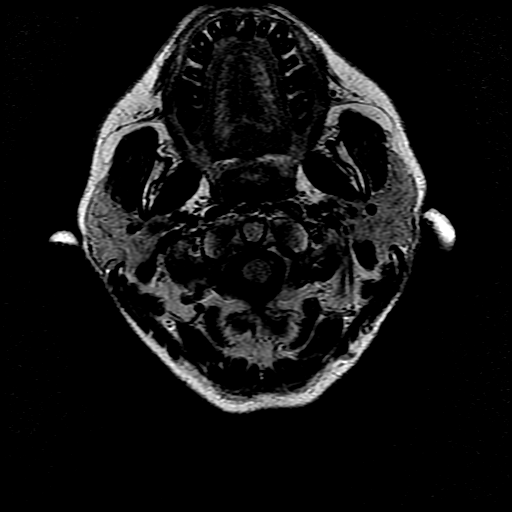
[im 26/26]
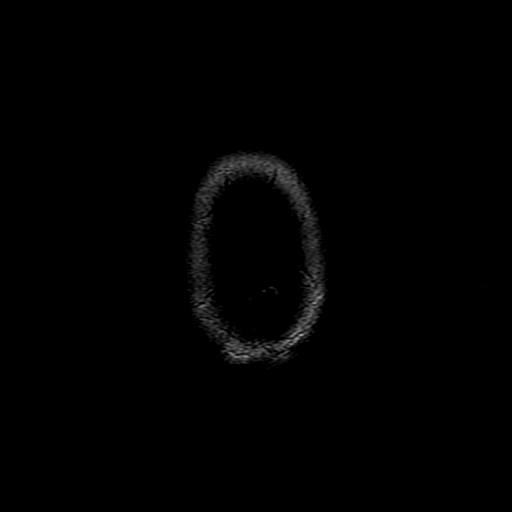

[Series 10: T1 post-contrast · sagittal · 5.0mm · 0.47mm/px · 2 of 25 slices shown (1 of 3)]
[im 1/25]
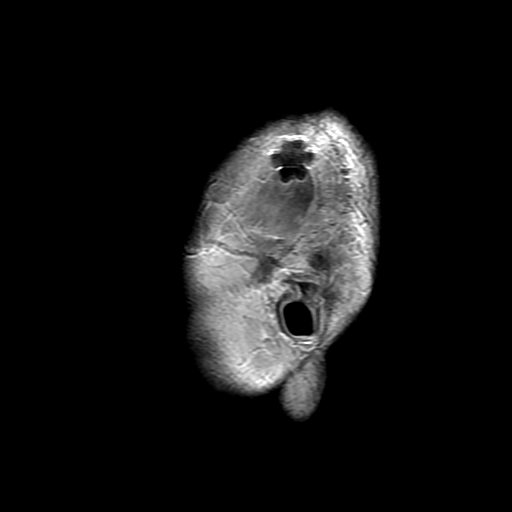
[im 25/25]
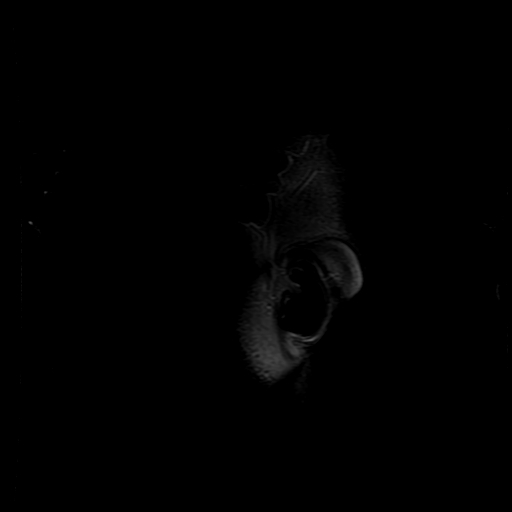

[Series 12: T1 post-contrast · axial · 3.0mm · 0.47mm/px · z∈[-52,+92]mm · 4 of 50 slices shown (2 of 3)]
[im 1/50]
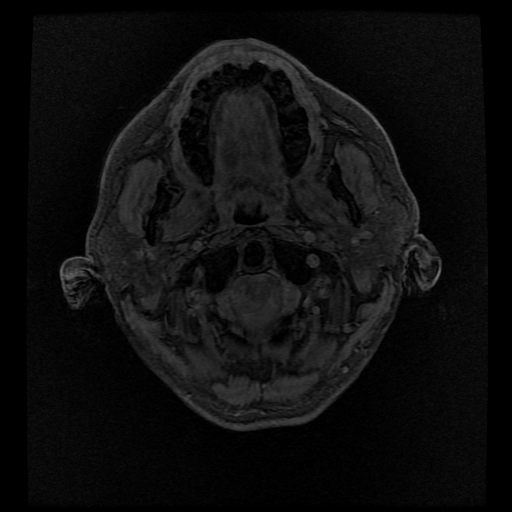
[im 17/50]
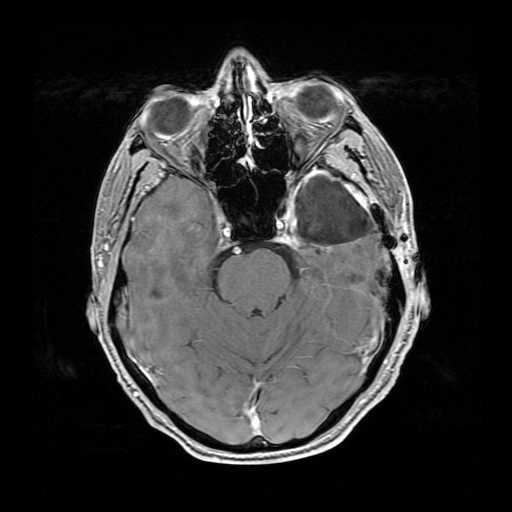
[im 33/50]
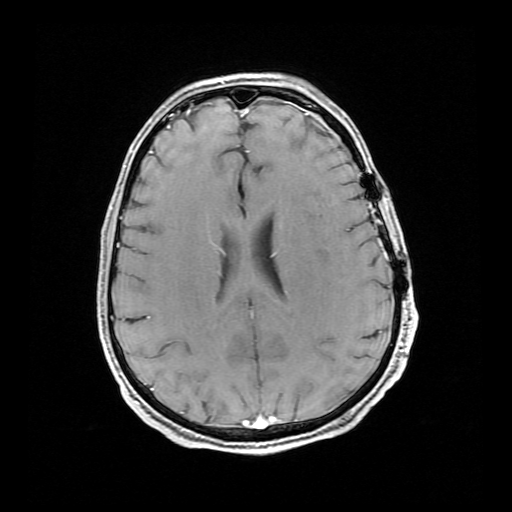
[im 50/50]
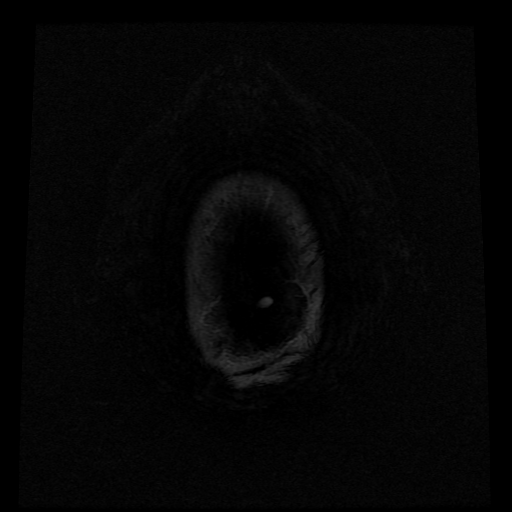

[Series 13: T1 post-contrast · coronal · 5.0mm · 0.39mm/px · 2 of 28 slices shown (3 of 3)]
[im 1/28]
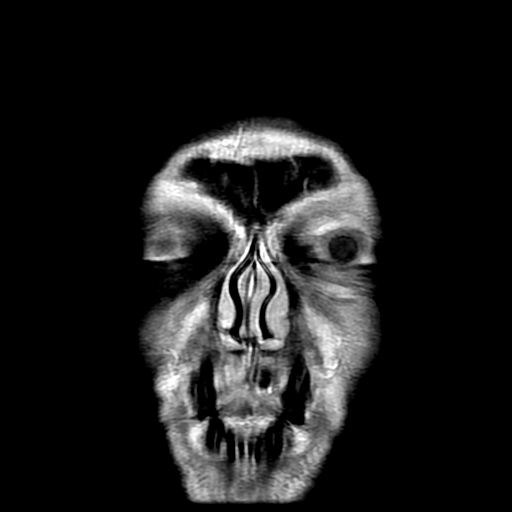
[im 28/28]
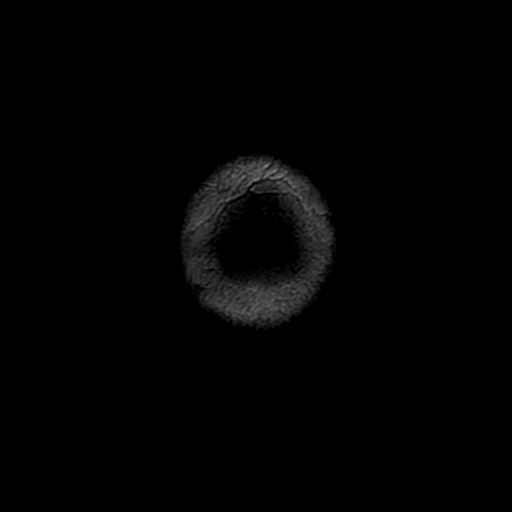

[Series 300: DWI · axial · 3.0mm · 1.09mm/px · z∈[-58,+85]mm · 4 of 50 slices shown (3 of 4)]
[im 1/50]
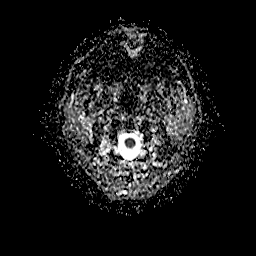
[im 17/50]
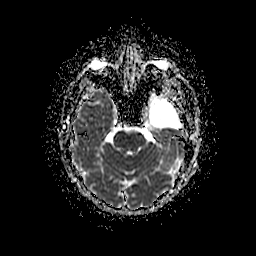
[im 33/50]
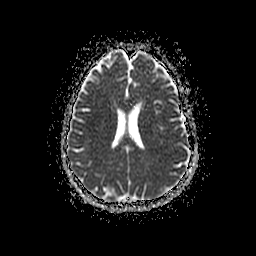
[im 50/50]
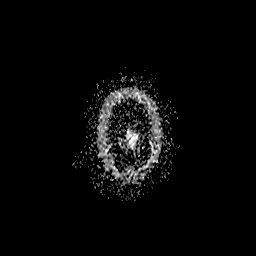

[Series 400: DWI · coronal · 5.0mm · 1.09mm/px · 3 of 35 slices shown (4 of 4)]
[im 1/35]
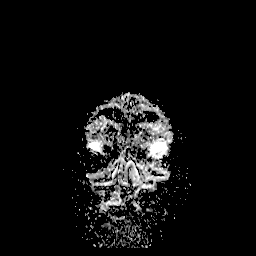
[im 18/35]
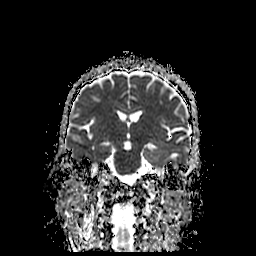
[im 35/35]
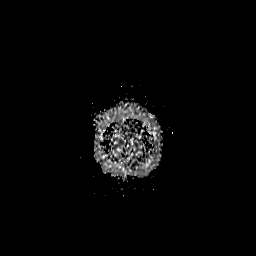

[34 of 48 positions shown; findings below may reference images not displayed]

FINDINGS: Brain: Resolution of immediate postoperative changes seen on the
prior study. Left anterior temporal lobe CSF intensity resection
cavity underlies the craniotomy. Enhancement is present at the
margins of the resection cavity and is probably postoperative in
nature. T2 FLAIR hyperintensity in the left temporal lobe extending
posteriorly including involvement of the hippocampus has decreased.

There is no acute infarction or intracranial hemorrhage. There is no
hydrocephalus or extra-axial fluid collection. Minimal foci of T2
hyperintensity in the supratentorial white matter are nonspecific
but may reflect minor chronic microvascular ischemic changes.

Vascular: Major vessel flow voids at the skull base are preserved.

Skull and upper cervical spine: Normal marrow signal is preserved.

Sinuses/Orbits: Paranasal sinuses are aerated. Orbits are
unremarkable.

Other: Sella is partially empty.  Mastoid air cells are clear.
IMPRESSION: Resolution of immediate postoperative changes. Decreased T2/FLAIR
signal abnormality. No suspicious enhancement.

## 2020-11-13 MED ORDER — GADOBUTROL 1 MMOL/ML IV SOLN
6.5000 mL | Freq: Once | INTRAVENOUS | Status: AC | PRN
  Administered 2020-11-13: 6.5 mL via INTRAVENOUS

## 2020-11-16 ENCOUNTER — Other Ambulatory Visit: Payer: Self-pay

## 2020-11-16 ENCOUNTER — Other Ambulatory Visit (HOSPITAL_COMMUNITY): Payer: Self-pay

## 2020-11-16 ENCOUNTER — Inpatient Hospital Stay: Payer: Medicaid Other | Attending: Internal Medicine

## 2020-11-16 ENCOUNTER — Inpatient Hospital Stay (HOSPITAL_BASED_OUTPATIENT_CLINIC_OR_DEPARTMENT_OTHER): Payer: Medicaid Other | Admitting: Internal Medicine

## 2020-11-16 ENCOUNTER — Telehealth: Payer: Self-pay | Admitting: Pharmacist

## 2020-11-16 VITALS — BP 109/67 | HR 69 | Temp 96.4°F | Resp 18 | Wt 132.5 lb

## 2020-11-16 DIAGNOSIS — G40909 Epilepsy, unspecified, not intractable, without status epilepticus: Secondary | ICD-10-CM | POA: Diagnosis not present

## 2020-11-16 DIAGNOSIS — C719 Malignant neoplasm of brain, unspecified: Secondary | ICD-10-CM | POA: Diagnosis not present

## 2020-11-16 DIAGNOSIS — R569 Unspecified convulsions: Secondary | ICD-10-CM | POA: Diagnosis not present

## 2020-11-16 DIAGNOSIS — Z9221 Personal history of antineoplastic chemotherapy: Secondary | ICD-10-CM | POA: Insufficient documentation

## 2020-11-16 DIAGNOSIS — C712 Malignant neoplasm of temporal lobe: Secondary | ICD-10-CM | POA: Insufficient documentation

## 2020-11-16 DIAGNOSIS — R112 Nausea with vomiting, unspecified: Secondary | ICD-10-CM | POA: Insufficient documentation

## 2020-11-16 DIAGNOSIS — Z79899 Other long term (current) drug therapy: Secondary | ICD-10-CM | POA: Diagnosis not present

## 2020-11-16 LAB — CMP (CANCER CENTER ONLY)
ALT: 11 U/L (ref 0–44)
AST: 14 U/L — ABNORMAL LOW (ref 15–41)
Albumin: 4.5 g/dL (ref 3.5–5.0)
Alkaline Phosphatase: 83 U/L (ref 38–126)
Anion gap: 10 (ref 5–15)
BUN: 8 mg/dL (ref 6–20)
CO2: 30 mmol/L (ref 22–32)
Calcium: 9.1 mg/dL (ref 8.9–10.3)
Chloride: 99 mmol/L (ref 98–111)
Creatinine: 0.81 mg/dL (ref 0.61–1.24)
GFR, Estimated: >60 mL/min (ref >60)
Glucose, Bld: 76 mg/dL (ref 70–99)
Potassium: 4.4 mmol/L (ref 3.5–5.1)
Sodium: 139 mmol/L (ref 135–145)
Total Bilirubin: 0.3 mg/dL (ref 0.3–1.2)
Total Protein: 6.9 g/dL (ref 6.5–8.1)

## 2020-11-16 LAB — CBC WITH DIFFERENTIAL (CANCER CENTER ONLY)
Abs Immature Granulocytes: 0 10*3/uL (ref 0.00–0.07)
Basophils Absolute: 0 10*3/uL (ref 0.0–0.1)
Basophils Relative: 0 %
Eosinophils Absolute: 0.1 10*3/uL (ref 0.0–0.5)
Eosinophils Relative: 5 %
HCT: 44 % (ref 39.0–52.0)
Hemoglobin: 14.9 g/dL (ref 13.0–17.0)
Immature Granulocytes: 0 %
Lymphocytes Relative: 23 %
Lymphs Abs: 0.6 10*3/uL — ABNORMAL LOW (ref 0.7–4.0)
MCH: 28.6 pg (ref 26.0–34.0)
MCHC: 33.9 g/dL (ref 30.0–36.0)
MCV: 84.5 fL (ref 80.0–100.0)
Monocytes Absolute: 0.3 10*3/uL (ref 0.1–1.0)
Monocytes Relative: 13 %
Neutro Abs: 1.5 10*3/uL — ABNORMAL LOW (ref 1.7–7.7)
Neutrophils Relative %: 59 %
Platelet Count: 212 10*3/uL (ref 150–400)
RBC: 5.21 MIL/uL (ref 4.22–5.81)
RDW: 13 % (ref 11.5–15.5)
WBC Count: 2.5 10*3/uL — ABNORMAL LOW (ref 4.0–10.5)
nRBC: 0 % (ref 0.0–0.2)

## 2020-11-16 MED ORDER — ONDANSETRON HCL 8 MG PO TABS
8.0000 mg | ORAL_TABLET | Freq: Two times a day (BID) | ORAL | 1 refills | Status: DC | PRN
  Filled 2020-11-16 – 2020-11-17 (×2): qty 30, 15d supply, fill #0

## 2020-11-16 MED ORDER — TEMOZOLOMIDE 100 MG PO CAPS
100.0000 mg | ORAL_CAPSULE | Freq: Every day | ORAL | 0 refills | Status: DC
  Filled 2020-11-16 – 2020-11-17 (×2): qty 5, 5d supply, fill #0

## 2020-11-16 MED ORDER — TEMOZOLOMIDE 140 MG PO CAPS
140.0000 mg | ORAL_CAPSULE | Freq: Every day | ORAL | 0 refills | Status: DC
  Filled 2020-11-16 – 2020-11-17 (×2): qty 5, 5d supply, fill #0

## 2020-11-16 NOTE — Telephone Encounter (Signed)
Oral Oncology Pharmacist Encounter  Received new prescription for Temodar (temozolomide) for the maintenance treatment of glioblastoma, planned duration 6-12 cycles.  Prescription dose and frequency assessed for appropriateness. CBC w/ Diff and CMP from 11/16/20 assessed, noted ANC 1.5 K/uL - recommend close monitoring for hematologic toxicities while on therapy.  Current medication list in Epic reviewed, no relevant/significant DDIs with Temodar identified.  Evaluated chart and no patient barriers to medication adherence noted.   Patient agreement for treatment documented in MD note on 11/16/20.  Prescription has been e-scribed to the [facility] for benefits analysis and approval.  Oral Oncology Clinic will continue to follow for insurance authorization, copayment issues, initial counseling and start date.  Leron Croak, PharmD, BCPS Hematology/Oncology Clinical Pharmacist [facility] [phone] 11/16/2020 3:15 PM

## 2020-11-16 NOTE — Progress Notes (Signed)
[name] [name], [place] [address] [phone]   Interval Evaluation  Date of Service: 11/16/20 [name] Patient MRN: [id number] Patient DOB: 02/16/1961 Provider: Ventura Sellers, MD  Identifying Statement:  Hunter Terry is a 60 y.o. 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 [name]; path reveals glioblastoma IDH-wt   08/02/2020 Surgery   [place] craniotomy with Dr. [name]   Glioblastoma [facility])  08/02/2020 Initial Diagnosis   Glioblastoma ([place])   09/05/2020 -  Chemotherapy    Patient is on Treatment Plan: BRAIN GLIOBLASTOMA RADIATION THERAPY WITH CONCURRENT TEMOZOLOMIDE 75 MG/M2 DAILY FOLLOWED BY SEQUENTIAL MAINTENANCE TEMOZOLOMIDE X 6-12 CYCLES        Biomarkers:  MGMT Unknown.  IDH 1/2 Wild type.  EGFR Unknown  TERT "Mutated   Interval History:  Corney Knighton presents today for follow up after recent post-RT MRI scan.  Denies new or progressive deficits. No balance issues, motor dysfunction.  Still no breakthrough seizures despite discontinuing Klonopin. Continues on Keppra, Dilantin. Fully functional at home with his wife, still not driving although he feels safe to.  H+P (07/24/20) Patient presented to medical attention in late October, 2021 with new onset seizure.  Event was decribed as loss of consciousness, sudden, without clarity on further details aside from altered mental status upon awakening.  CNS imaging demonsrated non-enhancing mass within left temporal lobe c/w likely glioma; he underwent stereotactic biopsy with Dr. [name] on 06/15/20.  There was significant delay in finalizing path, explaining delay in follow up and evaluation.  He  denies any seizures since discharge from hospital, on 4 anti-seizure drugs.  He does complain of dizziness and drowsiness with the medications, however.  Also describes impaired short term memory.  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    Medications: Current Outpatient Medications on File Prior to Visit  Medication Sig Dispense Refill  . levETIRAcetam (KEPPRA) 750 MG tablet Take 1 tablet (750 mg total) by mouth 2 (two) times daily. 60 tablet 0  . phenytoin (DILANTIN) 100 MG ER capsule Take 1 capsule (100 mg total) by mouth 2 (two) times daily. 60 capsule 11  . Carboxymethylcellul-Glycerin (LUBRICATING EYE DROPS OP) Place 1 drop into both eyes daily as needed (dry eyes). (Patient not taking: Reported on 11/16/2020)    . clonazePAM (KLONOPIN) 1 MG tablet Take 1 tablet (1 mg total) by mouth 2 (two) times daily. (Patient not taking: Reported on 11/16/2020) 90 tablet 1  . dexamethasone (DECADRON) 2 MG tablet 2 TABS 3 TIMES DAILY X2 DAYS, THEN 1 TAB 3TIMES DAILY X2 DAYS, 1 TAB TWICE DAILY X 2 DAYS, 1/2 TAB TWICE DAILY X2 DAYS, THEN 1/2 TAB DAILY X2 DAYS (Patient not taking: Reported on 11/16/2020) 25 tablet 0  . HYDROcodone-acetaminophen (NORCO/VICODIN) 5-325 MG tablet Take 1 tablet by mouth every 4 (four) hours as needed for moderate pain. (Patient not taking: Reported on 11/16/2020) 20 tablet 0  . HYDROcodone-acetaminophen (NORCO/VICODIN) 5-325 MG tablet TAKE 1 TABLET BY MOUTH EVERY 6 (SIX) HOURS AS NEEDED FOR UP TO 5 DAYS FOR MODERATE PAIN. (Patient not taking: Reported on 11/16/2020) 20 tablet 0  . ondansetron (ZOFRAN) 8 MG tablet TAKE 1 TABLET BY MOUTH 2 TIMES DAILY AS NEEDED FOR NAUSEA AND VOMITING *  MAY TAKE 30-60 MINS PRIOR TO TEMODAR ADMINISTRATION IF NAUSEA/VOMITING OCCURS (Patient not taking: Reported on 11/16/2020) 30 tablet 1  . oxymetazoline (AFRIN) 0.05 % nasal spray Place 1 spray into both nostrils 2 (two) times daily as needed for congestion. (Patient not taking: Reported on 11/16/2020)    .  temozolomide (TEMODAR) 100 MG capsule TAKE 1 CAPSULE (100 MG TOTAL) BY MOUTH DAILY. MAY TAKE ON AN EMPTY STOMACH TO DECREASE NAUSEA & VOMITING. (Patient not taking: Reported on 11/16/2020) 42 capsule 0  . temozolomide (TEMODAR) 20 MG capsule TAKE 1 CAPSULE (20 MG TOTAL) BY MOUTH DAILY. MAY TAKE ON AN EMPTY STOMACH TO DECREASE NAUSEA & VOMITING. (Patient not taking: Reported on 11/16/2020) 42 capsule 0  . [DISCONTINUED] lacosamide (VIMPAT) 200 MG TABS tablet Take 0.5 tablets (100 mg total) by mouth 2 (two) times daily. 30 tablet 0   No current facility-administered medications on file prior to visit.    Allergies: No Known Allergies Past Medical History:  Past Medical History:  Diagnosis Date  . Cancer ([place])    BRAIN TUMOR  . Headache   . Seizure [facility])    Past Surgical History:  Past Surgical History:  Procedure Laterality Date  . APPLICATION OF CRANIAL NAVIGATION N/A 06/15/2020   Procedure: APPLICATION OF CRANIAL NAVIGATION;  Surgeon: [name], MD;  Location: [place];  Service: Neurosurgery;  Laterality: N/A;  . APPLICATION OF CRANIAL NAVIGATION N/A 08/02/2020   Procedure: APPLICATION OF CRANIAL NAVIGATION;  Surgeon: [name], MD;  Location: [place];  Service: Neurosurgery;  Laterality: N/A;  . CRANIOTOMY N/A 08/02/2020   Procedure: CRANIOTOMY LEFT TEMPORAL LOBECTOMY FOR TUMOR EXCISION;  Surgeon: [name], MD;  Location: [place];  Service: Neurosurgery;  Laterality: N/A;  . FRAMELESS  BIOPSY WITH BRAINLAB Left 06/15/2020   Procedure: Left temporal lobe stereotactic brain biopsy with brainlab;  Surgeon: [name], MD;  Location: [place];  Service: Neurosurgery;  Laterality: Left;   Social History:  Social History   Socioeconomic History  . Marital status: Married    Spouse name: Not on file  . Number of children: Not on file  . Years of education: Not on file  . Highest education level: Not on file  Occupational History  . Not on file  Tobacco Use  .  Smoking status: Never Smoker  . Smokeless tobacco: Never Used  Substance and Sexual Activity  . Alcohol use: Never  . Drug use: Not on file  . Sexual activity: Yes    Partners: Female  Other Topics Concern  . Not on file  Social History Narrative  . Not on file   Social Determinants of Health   Financial Resource Strain: Not on file  Food Insecurity: Not on file  Transportation Needs: Not on file  Physical Activity: Not on file  Stress: Not on file  Social Connections: Not on file  Intimate Partner Violence: Not on file   Family History:  Family History  Problem Relation Age of Onset  . Cancer Neg Hx     Review of Systems: Constitutional: Doesn't report fevers, chills or abnormal weight loss Eyes: Doesn't report blurriness of vision Ears, nose, mouth, throat, and face: Doesn't report sore throat Respiratory: Doesn't report cough, dyspnea or wheezes Cardiovascular: Doesn't report palpitation, chest discomfort  Gastrointestinal:  Doesn't report nausea, constipation, diarrhea GU: Doesn't report incontinence Skin: Doesn't report skin rashes Neurological: Per HPI Musculoskeletal: Doesn't report joint pain Behavioral/Psych: Doesn't report anxiety  Physical Exam: Vitals:  11/16/20 1012  BP: 109/67  Pulse: 69  Resp: 18  Temp: (!) 96.4 F (35.8 C)  SpO2: 99%   KPS: 90. General: Alert, cooperative, pleasant, in no acute distress Head: Normal EENT: No conjunctival injection or scleral icterus.  Lungs: Resp effort normal Cardiac: Regular rate Abdomen: Non-distended abdomen Skin: No rashes cyanosis or petechiae. Extremities: No clubbing or edema  Neurologic Exam: Mental Status: Awake, alert, attentive to examiner. Oriented to self and environment. Language is fluent with intact comprehension.  Cranial Nerves: Visual acuity is grossly normal. Visual fields are full. Extra-ocular movements intact. No ptosis. Face is symmetric Motor: Tone and bulk are normal. Power is  full in both arms and legs. Reflexes are symmetric, no pathologic reflexes present.  Sensory: Intact to light touch Gait: Normal.   Labs: I have reviewed the data as listed    Component Value Date/Time   NA 132 (L) 10/19/2020 0937   K 4.0 10/19/2020 0937   CL 98 10/19/2020 0937   CO2 29 10/19/2020 0937   GLUCOSE 120 (H) 10/19/2020 0937   BUN 5 (L) 10/19/2020 0937   CREATININE 0.79 10/19/2020 0937   CALCIUM 8.9 10/19/2020 0937   PROT 6.7 10/19/2020 0937   ALBUMIN 4.3 10/19/2020 0937   AST 17 10/19/2020 0937   ALT 26 10/19/2020 0937   ALKPHOS 91 10/19/2020 0937   BILITOT 0.3 10/19/2020 0937   GFRNONAA >60 10/19/2020 2878   Lab Results  Component Value Date   WBC 2.5 (L) 11/16/2020   NEUTROABS 1.5 (L) 11/16/2020   HGB 14.9 11/16/2020   HCT 44.0 11/16/2020   MCV 84.5 11/16/2020   PLT 212 11/16/2020    Assessment/Plan Glioblastoma with isocitrate dehydrogenase gene wildtype ([address]) [C71.9]  Khaden Haub is clinically stable today, now having completed radiation and post-RT scan.  MRI demonstrates stable disease, with no progression in T2 signal abnormality or enhancing burden of disease.  Labs are again within normal limits.  We recommended initiating treatment with Temozolomide 133m/m2, on for five days and off for twenty three days in twenty eight day cycles. The patient will have a complete blood count performed on days 21 and 28 of each cycle, and a comprehensive metabolic panel performed on day 28 of each cycle. Labs may need to be performed more often. Zofran will prescribed for home use for nausea/vomiting.   Informed consent was obtained verbally at bedside to proceed with oral chemotherapy.  Chemotherapy should be held for the following:  ANC less than 1,000  Platelets less than 100,000  LFT or creatinine greater than 2x ULN  If clinical concerns/contraindications develop  Keppra will stay at 7589mBID, Dilantin will decrease to 10067maily due to long  term seizure freedom and concerns with cytopenias.  We ask that EucKalei Mckill[place]urn to clinic following post-RT brain MRI next month, or sooner as needed.  We appreciate the opportunity to participate in the care of [place]  All questions were answered. The patient knows to call the clinic with any problems, questions or concerns. No barriers to learning were detected.  The total time spent in the encounter was 40 minutes and more than 50% was on counseling and review of test results   ZacVentura SellersD Medical Director of Neuro-Oncology [facility]/07/22 10:25 AM

## 2020-11-16 NOTE — Progress Notes (Signed)
DISCONTINUE ON PATHWAY REGIMEN - Neuro     One cycle, concurrent with RT:     Temozolomide   **Always confirm dose/schedule in your pharmacy ordering system**  REASON: Continuation Of Treatment PRIOR TREATMENT: [id number]: Radiation Therapy with Concurrent Temozolomide 75 mg/m2 Daily x 6 Weeks, Followed by Sequential Temozolomide TREATMENT RESPONSE: Stable Disease (SD)  START ON PATHWAY REGIMEN - Neuro     A cycle is every 28 days:     Temozolomide      Temozolomide   **Always confirm dose/schedule in your pharmacy ordering system**  Patient Characteristics: Glioblastoma (Grade IV Glioma), Newly Diagnosed / Treatment Naive, Good Performance Status and/or Younger Patient, MGMT Promoter Unmethylated/Unknown Disease Classification: Glioma Disease Classification: Glioblastoma (Grade IV Glioma) Disease Status: Newly Diagnosed / Treatment Naive Performance Status: Good Performance Status and/or Younger Patient MGMT Promoter Methylation Status: Awaiting Test Results Intent of Therapy: Non-Curative / Palliative Intent, Discussed with Patient

## 2020-11-17 ENCOUNTER — Other Ambulatory Visit (HOSPITAL_COMMUNITY): Payer: Self-pay

## 2020-11-17 NOTE — Telephone Encounter (Signed)
Oral Chemotherapy Pharmacist Encounter   Attempted to reach patient to provide update and offer for initial counseling on oral medication: Temodar (temozolomide).   No answer. Unable to leave voicemail due to mailbox not being set up.  Leron Croak, PharmD, BCPS Hematology/Oncology Clinical Pharmacist [facility] [phone] 11/17/2020 10:59 AM

## 2020-11-17 NOTE — Telephone Encounter (Signed)
Oral Chemotherapy Pharmacist Encounter  I spoke with patient's wife, Hunter Terry with use of pacific interpreters, interpreter Judson Roch (ID # [phone])  for overview of: Temodar (temozolomide) for the maintenance treatment of glioblastoma multiforme, planned duration 6-12 months of treatment.  Counseled on administration, dosing, side effects, monitoring, drug-food interactions, safe handling, storage, and disposal.  Patient will take Temodar 100mg  capsules and Temodar 140mg  capsules, 240mg  total daily dose, by mouth once daily, may take at bedtime and on an empty stomach to decrease nausea and vomiting.  If 1st cycle is well tolerated, patient's wife informed that Temodar dose may be increased to 200 mg/m2 daily for 5 days on, 23 days off, repeated every 28 days for subsequent cycles   Patient will take Temodar daily for 5 days on, 23 days off, and repeated.  Temodar start date: 11/20/20 PM   Patient will take Zofran 8mg  tablet, 1 tablet by mouth 30-60 min prior to Temodar dose to help decrease N/V.   Adverse effects include but are not limited to: nausea, vomiting, anorexia, GI upset, rash, drug fever, and fatigue. Rare but serious adverse effects of pneumocystis pneumonia and secondary malignancy also discussed.  We discussed strategies to manage constipation if they occur secondary to ondansetron dosing.  PCP prophylaxis will not be initiated at this time, but may be added based on lymphocyte count in the future.  Reviewed importance of keeping a medication schedule and plan for any missed doses. No barriers to medication adherence identified.  Medication reconciliation performed and medication/allergy list updated.  Patient's family will pick the Temodar and ondansetron up from the [facility] on 11/18/20 or 11/20/20  Informed patient's wife that the pharmacy will reach out 5-7 days prior to needing next fill of Temodar to coordinate continued medication acquisition to  prevent break in therapy.  All questions answered.  Patient's wife voiced understanding and appreciation.   Medication education handout placed in mail for patient and family. Patient and family know to call the office with questions or concerns. Oral Chemotherapy Clinic phone number provided.   Leron Croak, PharmD, BCPS Hematology/Oncology Clinical Pharmacist [facility] [phone] 11/17/2020 3:05 PM

## 2020-11-20 ENCOUNTER — Other Ambulatory Visit: Payer: Self-pay

## 2020-11-20 ENCOUNTER — Ambulatory Visit
Admission: RE | Admit: 2020-11-20 | Discharge: 2020-11-20 | Disposition: A | Discharge status: Home / Self Care | Payer: Medicaid Other | Source: Ambulatory Visit | Attending: Radiation Oncology | Admitting: Radiation Oncology

## 2020-11-20 ENCOUNTER — Other Ambulatory Visit (HOSPITAL_COMMUNITY): Payer: Self-pay

## 2020-11-20 DIAGNOSIS — C719 Malignant neoplasm of brain, unspecified: Secondary | ICD-10-CM | POA: Insufficient documentation

## 2020-11-21 NOTE — Progress Notes (Signed)
  Radiation Oncology         [phone] ________________________________  Name: Hunter Terry MRN: [id number]  Date of Service: 11/20/2020  DOB: 1961-05-09  Post Treatment Telephone Note  Diagnosis:  Glioblastoma of the left temporal lobe  Interval Since Last Radiation: 5 weeks   09/05/2020 through 10/17/2020 Site Technique Total Dose (Gy) Dose per Fx (Gy) Completed Fx Beam Energies  Brain: Brain IMRT 46/46 2 23/23 6X  Brain: Brain_Bst IMRT 14/14 2 7/7 6X     Narrative:  The patient was contacted today for routine follow-up. During treatment he did very well with radiotherapy and did not have significant desquamation.   Impression/Plan: 1. Glioblastoma of the left temporal lobe. The patient was not able to be reached by phone and does not have voicemail set up. We will continue to follow along in conversation during brain oncology conference as he follows up with Dr. Mickeal Skinner in neuro oncology.      Carola Rhine, PAC

## 2020-11-29 ENCOUNTER — Telehealth: Payer: Self-pay | Admitting: Dietician

## 2020-11-29 NOTE — Telephone Encounter (Signed)
Nutrition Follow-up:  Patient with left temporal glioblastoma s/p craniotomy 08/02/20 and has completed IMRT. Patient receiving oral chemotherapy with Temozolomide.   Spoke with patient via telephone. He reports "doing much better" Patient reports improved numbness to hands and has been walking around outside with his wife. He reports good appetite and eating well. Patient usually has a banana with almond milk for breakfast, corn tortilla with eggs, onion, tomato, and cheese for lunch, drinks a fruit and vegetable smoothie with almond milk daily and snacks on fresh oranges and mangos. Patient reports sometimes eating fish and chicken, "maybe 3 times/week, but mostly we eat eggs." Patient reports not eating after 7 PM. He is having regular bowel movements twice daily. Patient denies nausea or vomiting, he is taking Zofran about 30 minutes prior to chemo.    Medications: Zofran  Labs: reviewed  Anthropometrics: Weight 132 lb 8 oz on 4/7 down 4.7% over the last month; concerning  139 lb on 3/10 141 lb 6.4 oz on 2/10 151 lb on 1/20   Estimated Energy Needs  Kcals: 1575-1800 Protein: 78-90 Fluid: 1.5 L  NUTRITION DIAGNOSIS: Inadequate oral intake ongoing   INTERVENTION:  Discussed adding snacks in between meals, high in protein Discussed strategies for adding calories and protein  Encouraged intake of variety of foods    MONITORING, EVALUATION, GOAL: weight trends, intake   NEXT VISIT: telephone follow-up ~ 3-4 weeks

## 2020-12-11 ENCOUNTER — Other Ambulatory Visit: Payer: Self-pay | Admitting: Internal Medicine

## 2020-12-11 ENCOUNTER — Other Ambulatory Visit (HOSPITAL_COMMUNITY): Payer: Self-pay

## 2020-12-11 DIAGNOSIS — C719 Malignant neoplasm of brain, unspecified: Secondary | ICD-10-CM

## 2020-12-12 ENCOUNTER — Other Ambulatory Visit (HOSPITAL_COMMUNITY): Payer: Self-pay

## 2020-12-19 ENCOUNTER — Telehealth: Payer: Self-pay | Admitting: Internal Medicine

## 2020-12-19 ENCOUNTER — Other Ambulatory Visit: Payer: Self-pay

## 2020-12-19 ENCOUNTER — Inpatient Hospital Stay: Payer: Medicaid Other | Attending: Internal Medicine

## 2020-12-19 ENCOUNTER — Other Ambulatory Visit (HOSPITAL_COMMUNITY): Payer: Self-pay

## 2020-12-19 ENCOUNTER — Inpatient Hospital Stay (HOSPITAL_BASED_OUTPATIENT_CLINIC_OR_DEPARTMENT_OTHER): Payer: Medicaid Other | Admitting: Internal Medicine

## 2020-12-19 VITALS — BP 117/67 | HR 67 | Temp 97.6°F | Resp 18 | Ht 65.0 in | Wt 133.9 lb

## 2020-12-19 DIAGNOSIS — C719 Malignant neoplasm of brain, unspecified: Secondary | ICD-10-CM

## 2020-12-19 DIAGNOSIS — Z809 Family history of malignant neoplasm, unspecified: Secondary | ICD-10-CM | POA: Diagnosis not present

## 2020-12-19 DIAGNOSIS — Z9221 Personal history of antineoplastic chemotherapy: Secondary | ICD-10-CM | POA: Insufficient documentation

## 2020-12-19 DIAGNOSIS — R112 Nausea with vomiting, unspecified: Secondary | ICD-10-CM | POA: Insufficient documentation

## 2020-12-19 DIAGNOSIS — C712 Malignant neoplasm of temporal lobe: Secondary | ICD-10-CM | POA: Diagnosis present

## 2020-12-19 DIAGNOSIS — R569 Unspecified convulsions: Secondary | ICD-10-CM | POA: Diagnosis not present

## 2020-12-19 DIAGNOSIS — Z79899 Other long term (current) drug therapy: Secondary | ICD-10-CM | POA: Insufficient documentation

## 2020-12-19 LAB — CBC WITH DIFFERENTIAL (CANCER CENTER ONLY)
Abs Immature Granulocytes: 0.02 10*3/uL (ref 0.00–0.07)
Basophils Absolute: 0 10*3/uL (ref 0.0–0.1)
Basophils Relative: 0 %
Eosinophils Absolute: 0.1 10*3/uL (ref 0.0–0.5)
Eosinophils Relative: 2 %
HCT: 44.5 % (ref 39.0–52.0)
Hemoglobin: 15.2 g/dL (ref 13.0–17.0)
Immature Granulocytes: 0 %
Lymphocytes Relative: 12 %
Lymphs Abs: 0.8 10*3/uL (ref 0.7–4.0)
MCH: 29.1 pg (ref 26.0–34.0)
MCHC: 34.2 g/dL (ref 30.0–36.0)
MCV: 85.1 fL (ref 80.0–100.0)
Monocytes Absolute: 0.6 10*3/uL (ref 0.1–1.0)
Monocytes Relative: 9 %
Neutro Abs: 5.1 10*3/uL (ref 1.7–7.7)
Neutrophils Relative %: 77 %
Platelet Count: 221 10*3/uL (ref 150–400)
RBC: 5.23 MIL/uL (ref 4.22–5.81)
RDW: 13.2 % (ref 11.5–15.5)
WBC Count: 6.7 10*3/uL (ref 4.0–10.5)
nRBC: 0 % (ref 0.0–0.2)

## 2020-12-19 LAB — CMP (CANCER CENTER ONLY)
ALT: 9 U/L (ref 0–44)
AST: 15 U/L (ref 15–41)
Albumin: 4.5 g/dL (ref 3.5–5.0)
Alkaline Phosphatase: 77 U/L (ref 38–126)
Anion gap: 10 (ref 5–15)
BUN: 8 mg/dL (ref 6–20)
CO2: 31 mmol/L (ref 22–32)
Calcium: 9.4 mg/dL (ref 8.9–10.3)
Chloride: 99 mmol/L (ref 98–111)
Creatinine: 0.79 mg/dL (ref 0.61–1.24)
GFR, Estimated: >60 mL/min (ref >60)
Glucose, Bld: 83 mg/dL (ref 70–99)
Potassium: 4.6 mmol/L (ref 3.5–5.1)
Sodium: 140 mmol/L (ref 135–145)
Total Bilirubin: 0.4 mg/dL (ref 0.3–1.2)
Total Protein: 7.1 g/dL (ref 6.5–8.1)

## 2020-12-19 MED ORDER — TEMOZOLOMIDE 180 MG PO CAPS
180.0000 mg | ORAL_CAPSULE | Freq: Every day | ORAL | 0 refills | Status: DC
  Filled 2020-12-19: qty 5, 28d supply, fill #0

## 2020-12-19 MED ORDER — TEMOZOLOMIDE 140 MG PO CAPS
140.0000 mg | ORAL_CAPSULE | Freq: Every day | ORAL | 0 refills | Status: DC
  Filled 2020-12-19: qty 5, 28d supply, fill #0

## 2020-12-19 NOTE — Progress Notes (Signed)
[facility] [place], [place] [address] [phone]   Interval Evaluation  Date of Service: 12/19/20 [name] Patient MRN: [id number] Patient DOB: 1961-08-05 Provider: Ventura Sellers, MD  Identifying Statement:  Hunter Terry is a 60 y.o. 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 [name]; path reveals glioblastoma IDH-wt   08/02/2020 Surgery   Debulking craniotomy with Dr. [name]   Glioblastoma [facility])  08/02/2020 Initial Diagnosis   Glioblastoma ([place])   09/05/2020 - 09/05/2020 Chemotherapy    Patient is on Treatment Plan: BRAIN GLIOBLASTOMA CONSOLIDATION TEMOZOLOMIDE DAYS 1-5 Q28 DAYS       11/19/2020 -  Chemotherapy    Patient is on Treatment Plan: BRAIN GLIOBLASTOMA CONSOLIDATION TEMOZOLOMIDE DAYS 1-5 Q28 DAYS         Biomarkers:  MGMT Unknown.  IDH 1/2 Wild type.  EGFR Unknown  TERT "Mutated   Interval History:  [name] presents today for follow up after first cycle of adjuvant Temodar.  He tolerated chemo well, without any significant side effects. Denies new or progressive deficits. No balance issues, motor dysfunction.  No recent seizures, continues on Keppra, Dilantin 19m HS. Fully functional at home with his wife.  H+P (07/24/20) Patient presented to medical attention in late October, 2021 with new onset seizure.  Event was decribed as loss of consciousness, sudden, without clarity on further details aside from altered mental status upon awakening.  CNS imaging demonsrated non-enhancing mass within left temporal lobe c/w likely glioma; he underwent stereotactic biopsy with Dr. TMarcello Mooreson 06/15/20.  There was significant delay in finalizing  path, explaining delay in follow up and evaluation.  He denies any seizures since discharge from hospital, on 4 anti-seizure drugs.  He does complain of dizziness and drowsiness with the medications, however.  Also describes impaired short term memory.  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    Medications: Current Outpatient Medications on File Prior to Visit  Medication Sig Dispense Refill  . Carboxymethylcellul-Glycerin (LUBRICATING EYE DROPS OP) Place 1 drop into both eyes daily as needed (dry eyes). (Patient not taking: Reported on 11/16/2020)    . clonazePAM (KLONOPIN) 1 MG tablet Take 1 tablet (1 mg total) by mouth 2 (two) times daily. (Patient not taking: Reported on 11/16/2020) 90 tablet 1  . dexamethasone (DECADRON) 2 MG tablet 2 TABS 3 TIMES DAILY X2 DAYS, THEN 1 TAB 3TIMES DAILY X2 DAYS, 1 TAB TWICE DAILY X 2 DAYS, 1/2 TAB TWICE DAILY X2 DAYS, THEN 1/2 TAB DAILY X2 DAYS (Patient not taking: Reported on 11/16/2020) 25 tablet 0  . HYDROcodone-acetaminophen (NORCO/VICODIN) 5-325 MG tablet Take 1 tablet by mouth every 4 (four) hours as needed for moderate pain. (Patient not taking: Reported on 11/16/2020) 20 tablet 0  . levETIRAcetam (KEPPRA) 750 MG tablet Take 1 tablet (750 mg total) by mouth 2 (two) times daily. 60 tablet 0  . ondansetron (ZOFRAN) 8 MG tablet Take 1 tablet (8 mg total) by mouth 2 (two) times daily as needed (nausea and vomiting). May take 30-60 minutes prior to Temodar administration if nausea/vomiting occurs. 30 tablet 1  . oxymetazoline (AFRIN) 0.05 % nasal spray Place 1 spray into both nostrils 2 (two) times daily as needed for congestion. (Patient not  taking: Reported on 11/16/2020)    . phenytoin (DILANTIN) 100 MG ER capsule Take 1 capsule (100 mg total) by mouth 2 (two) times daily. 60 capsule 11  . temozolomide (TEMODAR) 100 MG capsule Take 1 capsule (100 mg total) by mouth daily. May take on an empty stomach to decrease nausea & vomiting. 5 capsule 0  . temozolomide  (TEMODAR) 140 MG capsule Take 1 capsule (140 mg total) by mouth daily. May take on an empty stomach to decrease nausea & vomiting. 5 capsule 0  . [DISCONTINUED] lacosamide (VIMPAT) 200 MG TABS tablet Take 0.5 tablets (100 mg total) by mouth 2 (two) times daily. 30 tablet 0   No current facility-administered medications on file prior to visit.    Allergies: No Known Allergies Past Medical History:  Past Medical History:  Diagnosis Date  . Cancer ([place])    BRAIN TUMOR  . Headache   . Seizure [facility])    Past Surgical History:  Past Surgical History:  Procedure Laterality Date  . APPLICATION OF CRANIAL NAVIGATION N/A 06/15/2020   Procedure: APPLICATION OF CRANIAL NAVIGATION;  Surgeon: [name], MD;  Location: [place];  Service: Neurosurgery;  Laterality: N/A;  . APPLICATION OF CRANIAL NAVIGATION N/A 08/02/2020   Procedure: APPLICATION OF CRANIAL NAVIGATION;  Surgeon: [name], MD;  Location: [place];  Service: Neurosurgery;  Laterality: N/A;  . CRANIOTOMY N/A 08/02/2020   Procedure: CRANIOTOMY LEFT TEMPORAL LOBECTOMY FOR TUMOR EXCISION;  Surgeon: [name], MD;  Location: [place];  Service: Neurosurgery;  Laterality: N/A;  . FRAMELESS  BIOPSY WITH BRAINLAB Left 06/15/2020   Procedure: Left temporal lobe stereotactic brain biopsy with brainlab;  Surgeon: [name], MD;  Location: [place];  Service: Neurosurgery;  Laterality: Left;   Social History:  Social History   Socioeconomic History  . Marital status: Married    Spouse name: Not on file  . Number of children: Not on file  . Years of education: Not on file  . Highest education level: Not on file  Occupational History  . Not on file  Tobacco Use  . Smoking status: Never Smoker  . Smokeless tobacco: Never Used  Substance and Sexual Activity  . Alcohol use: Never  . Drug use: Not on file  . Sexual activity: Yes    Partners: Female  Other Topics Concern  . Not on file  Social History Narrative  .  Not on file   Social Determinants of Health   Financial Resource Strain: Not on file  Food Insecurity: Not on file  Transportation Needs: Not on file  Physical Activity: Not on file  Stress: Not on file  Social Connections: Not on file  Intimate Partner Violence: Not on file   Family History:  Family History  Problem Relation Age of Onset  . Cancer Neg Hx     Review of Systems: Constitutional: Doesn't report fevers, chills or abnormal weight loss Eyes: Doesn't report blurriness of vision Ears, nose, mouth, throat, and face: Doesn't report sore throat Respiratory: Doesn't report cough, dyspnea or wheezes Cardiovascular: Doesn't report palpitation, chest discomfort  Gastrointestinal:  Doesn't report nausea, constipation, diarrhea GU: Doesn't report incontinence Skin: Doesn't report skin rashes Neurological: Per HPI Musculoskeletal: Doesn't report joint pain Behavioral/Psych: Doesn't report anxiety  Physical Exam: Vitals:   12/19/20 0900  BP: 117/67  Pulse: 67  Resp: 18  Temp: 97.6 F (36.4 C)  SpO2: 100%   KPS: 90. General: Alert, cooperative, pleasant, in no acute distress Head:  Normal EENT: No conjunctival injection or scleral icterus.  Lungs: Resp effort normal Cardiac: Regular rate Abdomen: Non-distended abdomen Skin: No rashes cyanosis or petechiae. Extremities: No clubbing or edema  Neurologic Exam: Mental Status: Awake, alert, attentive to examiner. Oriented to self and environment. Language is fluent with intact comprehension.  Cranial Nerves: Visual acuity is grossly normal. Visual fields are full. Extra-ocular movements intact. No ptosis. Face is symmetric Motor: Tone and bulk are normal. Power is full in both arms and legs. Reflexes are symmetric, no pathologic reflexes present.  Sensory: Intact to light touch Gait: Normal.   Labs: I have reviewed the data as listed    Component Value Date/Time   NA 139 11/16/2020 1000   K 4.4 11/16/2020 1000    CL 99 11/16/2020 1000   CO2 30 11/16/2020 1000   GLUCOSE 76 11/16/2020 1000   BUN 8 11/16/2020 1000   CREATININE 0.81 11/16/2020 1000   CALCIUM 9.1 11/16/2020 1000   PROT 6.9 11/16/2020 1000   ALBUMIN 4.5 11/16/2020 1000   AST 14 (L) 11/16/2020 1000   ALT 11 11/16/2020 1000   ALKPHOS 83 11/16/2020 1000   BILITOT 0.3 11/16/2020 1000   GFRNONAA >60 11/16/2020 1000   Lab Results  Component Value Date   WBC 2.5 (L) 11/16/2020   NEUTROABS 1.5 (L) 11/16/2020   HGB 14.9 11/16/2020   HCT 44.0 11/16/2020   MCV 84.5 11/16/2020   PLT 212 11/16/2020    Assessment/Plan 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  No new or progressive deficits or seizures.  Labs are again within normal limits.  We recommended continuing treatment with Temozolomide, cycle #2 dose increased to 247m/m2, on for five days and off for twenty three days in twenty eight day cycles. The patient will have a complete blood count performed on days 21 and 28 of each cycle, and a comprehensive metabolic panel performed on day 28 of each cycle. Labs may need to be performed more often. Zofran will prescribed for home use for nausea/vomiting.   Chemotherapy should be held for the following:  ANC less than 1,000  Platelets less than 100,000  LFT or creatinine greater than 2x ULN  If clinical concerns/contraindications develop  Keppra will stay at 7530mBID, Dilantin may be discontinued due to long term seizure freedom and concerns with side effects of long term use.  We ask that EuBrave Dacketurn to clinic in 1 months following next brain MRI prior to cycle #3, or sooner as needed.  We appreciate the opportunity to participate in the care of EuNewry   All questions were answered. The patient knows to call the clinic with any problems, questions or concerns. No barriers to  learning were detected.  The total time spent in the encounter was 30 minutes and more than 50% was on counseling and review of test results   ZaVentura SellersMD Medical Director of Neuro-Oncology [facility] We[place]ackett[place]5/10/22 8:55 AM

## 2020-12-19 NOTE — Telephone Encounter (Signed)
Scheduled per los. Gave avs and calendar  

## 2020-12-20 ENCOUNTER — Other Ambulatory Visit (HOSPITAL_COMMUNITY): Payer: Self-pay

## 2021-01-02 ENCOUNTER — Other Ambulatory Visit: Payer: Self-pay

## 2021-01-02 ENCOUNTER — Ambulatory Visit (HOSPITAL_COMMUNITY)
Admission: RE | Admit: 2021-01-02 | Discharge: 2021-01-02 | Disposition: A | Discharge status: Home / Self Care | Payer: Medicaid Other | Source: Ambulatory Visit | Attending: Internal Medicine | Admitting: Internal Medicine

## 2021-01-02 DIAGNOSIS — C719 Malignant neoplasm of brain, unspecified: Secondary | ICD-10-CM | POA: Diagnosis present

## 2021-01-02 IMAGING — MR MR HEAD WO/W CM
14 of 18 series · 40 of 48 positions shown · IV contrast (gadavist)
Comparison: MRI head [DATE].

CLINICAL DATA: Brain/CNS neoplasm, assess treatment response.

EXAM:
MRI HEAD WITHOUT AND WITH CONTRAST
TECHNIQUE: Multiplanar, multiecho pulse sequences of the brain and surrounding
structures were obtained without and with intravenous contrast.
CONTRAST:  5.5mL GADAVIST GADOBUTROL 1 MMOL/ML IV SOLN

[Series 5: DWI · axial · 3.0mm · 1.36mm/px · z∈[-75,+76]mm · 5 of 104 slices shown (1 of 2)]
[im 1/104]
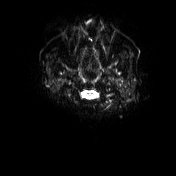
[im 26/104]
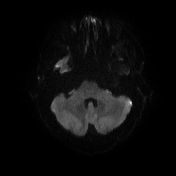
[im 52/104]
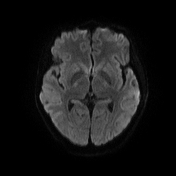
[im 78/104]
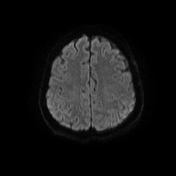
[im 104/104]
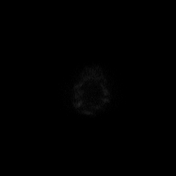

[Series 6: DWI · axial · 3.0mm · 1.36mm/px · z∈[-75,+76]mm · 2 of 49 slices shown (2 of 2)]
[im 1/49]
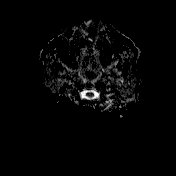
[im 49/49]
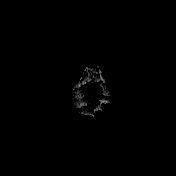

[Series 7: T1 · sagittal · 5.0mm · 0.75mm/px · 1 of 24 slices shown (1 of 2)]
[im 1/24]
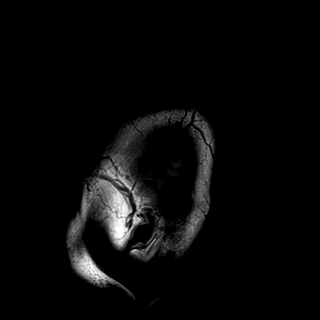

[Series 8: T2 · axial · 5.0mm · 0.62mm/px · 1 of 26 slices shown]
[im 1/26]
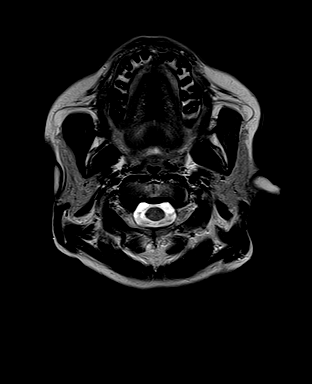

[Series 9: mip_images(sw) · axial · 24.0mm · 0.75mm/px · z∈[-75,+68]mm · 2 of 49 slices shown]
[im 1/49]
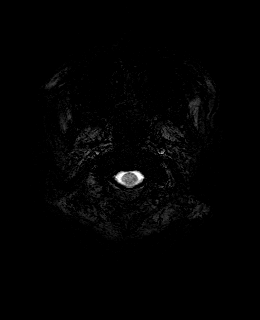
[im 49/49]
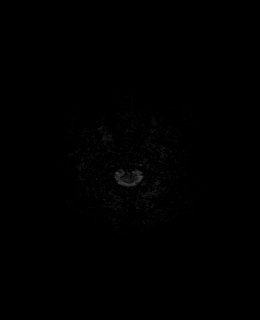

[Series 10: swi_images · axial · 3.0mm · 0.75mm/px · z∈[-85,+78]mm · 3 of 56 slices shown]
[im 1/56]
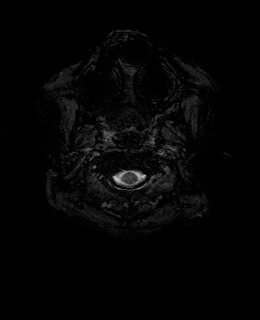
[im 28/56]
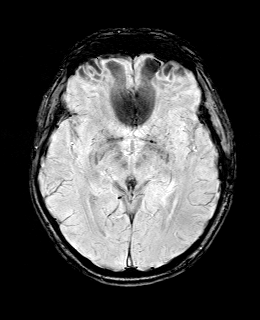
[im 56/56]
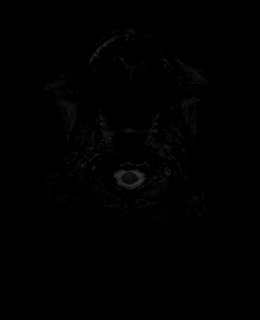

[Series 11: FLAIR · axial · 3.0mm · 0.75mm/px · z∈[-79,+72]mm · 3 of 52 slices shown]
[im 1/52]
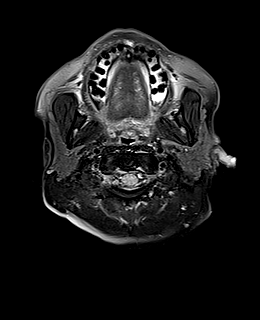
[im 26/52]
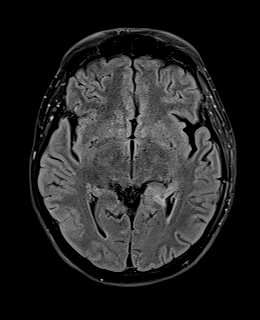
[im 52/52]
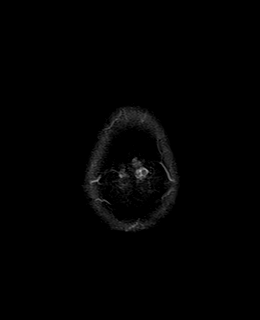

[Series 12: T1 · axial · 1.0mm · 0.94mm/px · z∈[-90,+68]mm · 8 of 160 slices shown (2 of 2)]
[im 1/160]
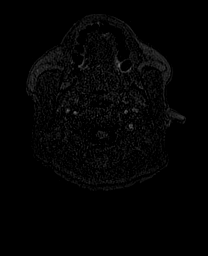
[im 23/160]
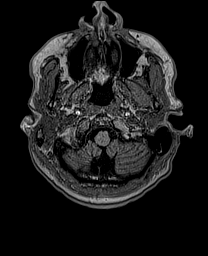
[im 46/160]
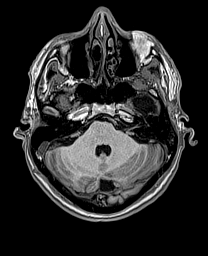
[im 69/160]
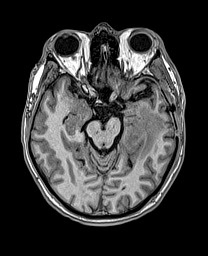
[im 91/160]
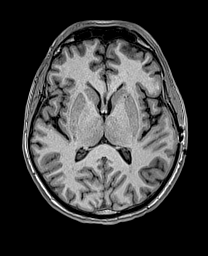
[im 114/160]
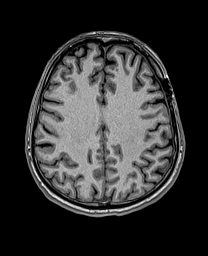
[im 137/160]
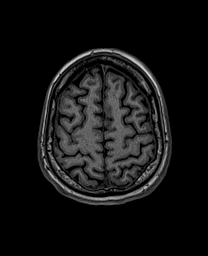
[im 160/160]
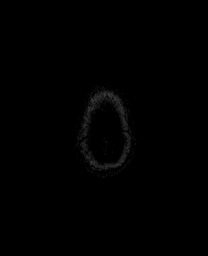

[Series 13: cor dwi_tracew · coronal · 5.0mm · 1.53mm/px · 3 of 56 slices shown]
[im 1/56]
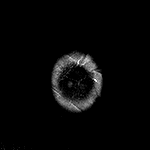
[im 28/56]
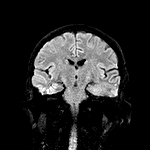
[im 56/56]
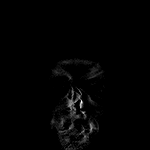

[Series 14: cor dwi_adc · coronal · 5.0mm · 1.53mm/px · 1 of 28 slices shown]
[im 1/28]
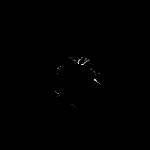

[Series 15: T2 post-contrast · coronal · 5.0mm · 0.57mm/px · 1 of 29 slices shown]
[im 1/29]
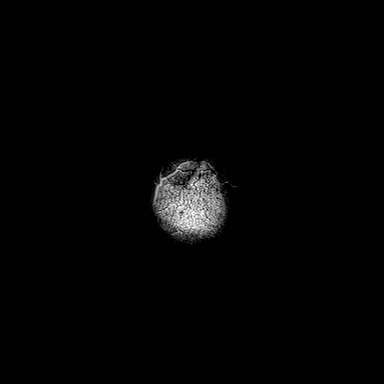

[Series 16: T1 post-contrast · axial · 1.0mm · 0.94mm/px · z∈[-90,+68]mm · 8 of 160 slices shown (1 of 3)]
[im 1/160]
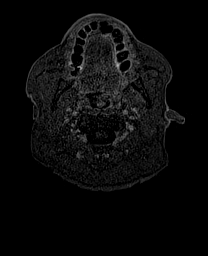
[im 23/160]
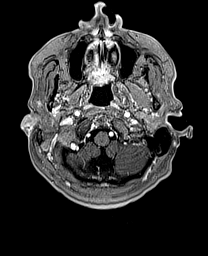
[im 46/160]
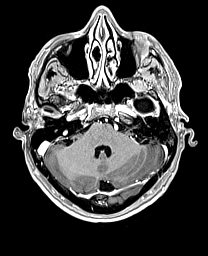
[im 69/160]
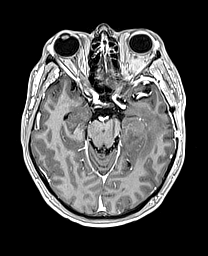
[im 91/160]
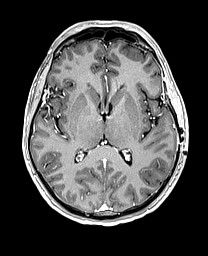
[im 114/160]
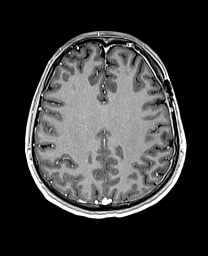
[im 137/160]
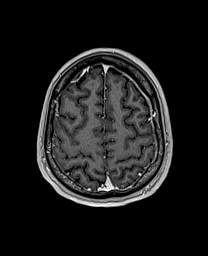
[im 160/160]
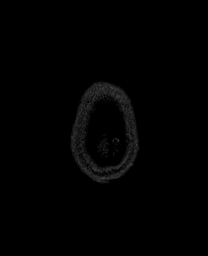

[Series 18: T1 post-contrast · sagittal · 5.0mm · 0.75mm/px · 1 of 26 slices shown (2 of 3)]
[im 1/26]
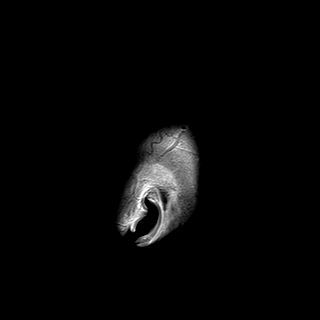

[Series 19: T1 post-contrast · coronal · 5.0mm · 0.43mm/px · 1 of 29 slices shown (3 of 3)]
[im 1/29]
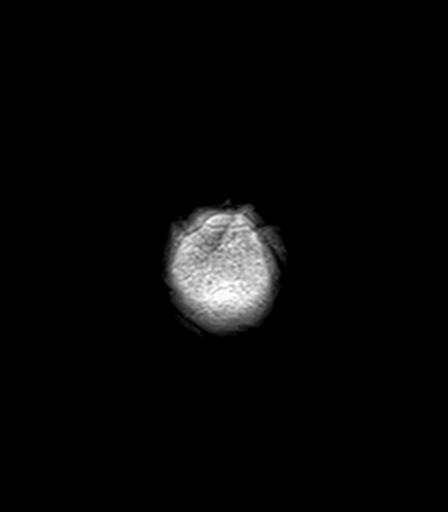

[40 of 48 positions shown; findings below may reference images not displayed]

FINDINGS: Brain: Left anterior temporal lobe of CSF intensity resection cavity
is similar in size. Possible slight increase in T2/FLAIR
hyperintensity surrounding the resection cavity which extends
posteriorly to involve the hippocampus (for example series 8, images
11 and 12 and series 11, image 22 and 23). Similar size of the
cystic, CSF intensity resection cavity. There is slight increase in
bulk of enhancement along the posterior aspect of the resection
cavity (for example, series 16, image 64 and series 18, image 21).

No evidence of acute infarct or restricted diffusion. No
hydrocephalus. No acute hemorrhage. Similar small scattered T2/FLAIR
hyperintensities in the white matter, likely mild chronic
microvascular ischemic disease.

Vascular: Major arterial flow voids are maintained at the skull
base. Similar small right vertebral artery.

Skull and upper cervical spine: Normal marrow signal.

Sinuses/Orbits: No substantial sinus disease.  Unremarkable orbits.

Other: Moderate left mastoid effusion.
IMPRESSION: 1. Possible slight increase in T2/FLAIR hyperintensity surrounding
the resection cavity. Additionally, there is slight increase in bulk
of linear enhancement along the posterior aspect of the resection
cavity. These findings could represent treatment related change or
slight progression of tumor. Recommend short interval follow-up.
2. Otherwise, similar appearance of the resection cavity including
linear, likely postoperative enhancement posteriorly.
3. Moderate left mastoid effusion.

## 2021-01-02 MED ORDER — GADOBUTROL 1 MMOL/ML IV SOLN
5.5000 mL | Freq: Once | INTRAVENOUS | Status: AC | PRN
  Administered 2021-01-02: 5.5 mL via INTRAVENOUS

## 2021-01-09 ENCOUNTER — Other Ambulatory Visit (HOSPITAL_COMMUNITY): Payer: Self-pay

## 2021-01-11 ENCOUNTER — Other Ambulatory Visit: Payer: Self-pay | Admitting: Radiation Therapy

## 2021-01-15 ENCOUNTER — Inpatient Hospital Stay: Payer: Medicaid Other | Attending: Internal Medicine

## 2021-01-15 DIAGNOSIS — C712 Malignant neoplasm of temporal lobe: Secondary | ICD-10-CM | POA: Insufficient documentation

## 2021-01-15 DIAGNOSIS — R112 Nausea with vomiting, unspecified: Secondary | ICD-10-CM | POA: Insufficient documentation

## 2021-01-15 DIAGNOSIS — Z9221 Personal history of antineoplastic chemotherapy: Secondary | ICD-10-CM | POA: Insufficient documentation

## 2021-01-15 DIAGNOSIS — Z79899 Other long term (current) drug therapy: Secondary | ICD-10-CM | POA: Insufficient documentation

## 2021-01-16 ENCOUNTER — Other Ambulatory Visit (HOSPITAL_COMMUNITY): Payer: Self-pay

## 2021-01-16 ENCOUNTER — Inpatient Hospital Stay (HOSPITAL_BASED_OUTPATIENT_CLINIC_OR_DEPARTMENT_OTHER): Payer: Medicaid Other | Admitting: Internal Medicine

## 2021-01-16 ENCOUNTER — Other Ambulatory Visit: Payer: Self-pay

## 2021-01-16 ENCOUNTER — Inpatient Hospital Stay: Payer: Medicaid Other

## 2021-01-16 VITALS — BP 107/71 | HR 65 | Temp 98.4°F | Resp 17 | Wt 133.7 lb

## 2021-01-16 DIAGNOSIS — C719 Malignant neoplasm of brain, unspecified: Secondary | ICD-10-CM | POA: Diagnosis not present

## 2021-01-16 DIAGNOSIS — Z9221 Personal history of antineoplastic chemotherapy: Secondary | ICD-10-CM | POA: Diagnosis not present

## 2021-01-16 DIAGNOSIS — C712 Malignant neoplasm of temporal lobe: Secondary | ICD-10-CM | POA: Diagnosis present

## 2021-01-16 DIAGNOSIS — R569 Unspecified convulsions: Secondary | ICD-10-CM | POA: Diagnosis not present

## 2021-01-16 DIAGNOSIS — R112 Nausea with vomiting, unspecified: Secondary | ICD-10-CM | POA: Diagnosis not present

## 2021-01-16 DIAGNOSIS — Z79899 Other long term (current) drug therapy: Secondary | ICD-10-CM | POA: Diagnosis not present

## 2021-01-16 LAB — CBC WITH DIFFERENTIAL (CANCER CENTER ONLY)
Abs Immature Granulocytes: 0.01 10*3/uL (ref 0.00–0.07)
Basophils Absolute: 0 10*3/uL (ref 0.0–0.1)
Basophils Relative: 1 %
Eosinophils Absolute: 0.1 10*3/uL (ref 0.0–0.5)
Eosinophils Relative: 2 %
HCT: 42.7 % (ref 39.0–52.0)
Hemoglobin: 14.6 g/dL (ref 13.0–17.0)
Immature Granulocytes: 0 %
Lymphocytes Relative: 21 %
Lymphs Abs: 0.7 10*3/uL (ref 0.7–4.0)
MCH: 29.2 pg (ref 26.0–34.0)
MCHC: 34.2 g/dL (ref 30.0–36.0)
MCV: 85.4 fL (ref 80.0–100.0)
Monocytes Absolute: 0.5 10*3/uL (ref 0.1–1.0)
Monocytes Relative: 14 %
Neutro Abs: 2.2 10*3/uL (ref 1.7–7.7)
Neutrophils Relative %: 62 %
Platelet Count: 203 10*3/uL (ref 150–400)
RBC: 5 MIL/uL (ref 4.22–5.81)
RDW: 13.4 % (ref 11.5–15.5)
WBC Count: 3.5 10*3/uL — ABNORMAL LOW (ref 4.0–10.5)
nRBC: 0 % (ref 0.0–0.2)

## 2021-01-16 LAB — CMP (CANCER CENTER ONLY)
ALT: 9 U/L (ref 0–44)
AST: 13 U/L — ABNORMAL LOW (ref 15–41)
Albumin: 4.2 g/dL (ref 3.5–5.0)
Alkaline Phosphatase: 66 U/L (ref 38–126)
Anion gap: 7 (ref 5–15)
BUN: 8 mg/dL (ref 6–20)
CO2: 32 mmol/L (ref 22–32)
Calcium: 9.6 mg/dL (ref 8.9–10.3)
Chloride: 102 mmol/L (ref 98–111)
Creatinine: 0.77 mg/dL (ref 0.61–1.24)
GFR, Estimated: >60 mL/min (ref >60)
Glucose, Bld: 86 mg/dL (ref 70–99)
Potassium: 4.1 mmol/L (ref 3.5–5.1)
Sodium: 141 mmol/L (ref 135–145)
Total Bilirubin: 0.5 mg/dL (ref 0.3–1.2)
Total Protein: 7 g/dL (ref 6.5–8.1)

## 2021-01-16 MED ORDER — TEMOZOLOMIDE 180 MG PO CAPS
180.0000 mg | ORAL_CAPSULE | Freq: Every day | ORAL | 0 refills | Status: DC
Start: 2021-01-16 — End: 2021-02-15
  Filled 2021-01-16: qty 5, 28d supply, fill #0

## 2021-01-16 MED ORDER — TEMOZOLOMIDE 140 MG PO CAPS
140.0000 mg | ORAL_CAPSULE | Freq: Every day | ORAL | 0 refills | Status: DC
  Filled 2021-01-16: qty 5, 28d supply, fill #0

## 2021-01-16 NOTE — Progress Notes (Signed)
[facility] [place], [place] [address] [phone]   Interval Evaluation  Date of Service: 01/16/21 [name] Patient MRN: [id number] Patient DOB: 1961/04/21 Provider: Ventura Sellers, MD  Identifying Statement:  Hunter Terry is a 60 y.o. 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 Marcello Terry; path reveals glioblastoma IDH-wt   08/02/2020 Surgery   Debulking craniotomy with Dr. Marcello Terry   Glioblastoma [facility])  06/15/2020 Surgery   Stereotactic biopsy, L temporal Hunter Terry).  Path demonstrates glioblastoma IDH-1 wt   08/02/2020 Surgery   Debulking craniotomy with Dr. Marcello Terry   09/05/2020 - 10/17/2020 Radiation Therapy   IMRT with concurrent Temozolomide Hunter Terry)   11/19/2020 -  Chemotherapy   Initiates cycle #1 adjuvant Temozolomide         Biomarkers:  MGMT Unknown.  IDH 1/2 Wild type.  EGFR Unknown  TERT "Mutated   Interval History:  Hunter Terry presents today for follow up after second cycle of adjuvant Temodar.  No issues with the higher 221m/m2 dose level this month. Denies new or progressive deficits. No balance issues, motor dysfunction.  Now on Keppra alone, 7527mtwice per day. Fully functional at home with his wife.  H+P (07/24/20) Patient presented to medical attention in late October, 2021 with new onset seizure.  Event was decribed as loss of consciousness, sudden, without clarity on further details aside from altered mental status upon awakening.  CNS imaging demonsrated non-enhancing mass within left temporal lobe c/w likely glioma; he underwent stereotactic biopsy with Dr. ThMarcello Terry 06/15/20.  There was significant delay in finalizing path,  explaining delay in follow up and evaluation.  He denies any seizures since discharge from hospital, on 4 anti-seizure drugs.  He does complain of dizziness and drowsiness with the medications, however.  Also describes impaired short term memory.  Had worked as a trAdministratorNo further decadron.    Medications: Current Outpatient Medications on File Prior to Visit  Medication Sig Dispense Refill  . Carboxymethylcellul-Glycerin (LUBRICATING EYE DROPS OP) Place 1 drop into both eyes daily as needed (dry eyes). (Patient not taking: Reported on 11/16/2020)    . clonazePAM (KLONOPIN) 1 MG tablet Take 1 tablet (1 mg total) by mouth 2 (two) times daily. (Patient not taking: Reported on 11/16/2020) 90 tablet 1  . dexamethasone (DECADRON) 2 MG tablet 2 TABS 3 TIMES DAILY X2 DAYS, THEN 1 TAB 3TIMES DAILY X2 DAYS, 1 TAB TWICE DAILY X 2 DAYS, 1/2 TAB TWICE DAILY X2 DAYS, THEN 1/2 TAB DAILY X2 DAYS (Patient not taking: Reported on 11/16/2020) 25 tablet 0  . HYDROcodone-acetaminophen (NORCO/VICODIN) 5-325 MG tablet Take 1 tablet by mouth every 4 (four) hours as needed for moderate pain. (Patient not taking: Reported on 11/16/2020) 20 tablet 0  . levETIRAcetam (KEPPRA) 750 MG tablet Take 1 tablet (750 mg total) by mouth 2 (two) times daily. 60 tablet 0  . ondansetron (ZOFRAN) 8 MG tablet Take 1 tablet (8 mg total) by mouth 2 (two) times daily as needed (nausea and vomiting). May take 30-60 minutes prior to Temodar administration if nausea/vomiting occurs. 30 tablet 1  . oxymetazoline (AFRIN) 0.05 % nasal spray Place 1 spray into both nostrils 2 (two) times daily as needed for congestion. (Patient not taking: Reported  on 11/16/2020)    . phenytoin (DILANTIN) 100 MG ER capsule Take 1 capsule (100 mg total) by mouth 2 (two) times daily. 60 capsule 11  . temozolomide (TEMODAR) 100 MG capsule Take 1 capsule (100 mg total) by mouth daily. May take on an empty stomach to decrease nausea & vomiting. 5 capsule 0  . temozolomide  (TEMODAR) 140 MG capsule Take 1 capsule (140 mg total) by mouth daily. May take on an empty stomach to decrease nausea & vomiting. 5 capsule 0  . temozolomide (TEMODAR) 180 MG capsule Take 1 capsule (180 mg total) by mouth daily. May take on an empty stomach to decrease nausea & vomiting. 5 capsule 0  . [DISCONTINUED] lacosamide (VIMPAT) 200 MG TABS tablet Take 0.5 tablets (100 mg total) by mouth 2 (two) times daily. 30 tablet 0   No current facility-administered medications on file prior to visit.    Allergies: No Known Allergies Past Medical History:  Past Medical History:  Diagnosis Date  . Cancer ([place])    BRAIN TUMOR  . Headache   . Seizure [facility])    Past Surgical History:  Past Surgical History:  Procedure Laterality Date  . APPLICATION OF CRANIAL NAVIGATION N/A 06/15/2020   Procedure: APPLICATION OF CRANIAL NAVIGATION;  Surgeon: Hunter Mare, MD;  Location: [place];  Service: Neurosurgery;  Laterality: N/A;  . APPLICATION OF CRANIAL NAVIGATION N/A 08/02/2020   Procedure: APPLICATION OF CRANIAL NAVIGATION;  Surgeon: Hunter Mare, MD;  Location: [place];  Service: Neurosurgery;  Laterality: N/A;  . CRANIOTOMY N/A 08/02/2020   Procedure: CRANIOTOMY LEFT TEMPORAL LOBECTOMY FOR TUMOR EXCISION;  Surgeon: Hunter Mare, MD;  Location: [place];  Service: Neurosurgery;  Laterality: N/A;  . FRAMELESS  BIOPSY WITH BRAINLAB Left 06/15/2020   Procedure: Left temporal lobe stereotactic brain biopsy with brainlab;  Surgeon: Hunter Mare, MD;  Location: [place];  Service: Neurosurgery;  Laterality: Left;   Social History:  Social History   Socioeconomic History  . Marital status: Married    Spouse name: Not on file  . Number of children: Not on file  . Years of education: Not on file  . Highest education level: Not on file  Occupational History  . Not on file  Tobacco Use  . Smoking status: Never Smoker  . Smokeless tobacco: Never Used  Substance and Sexual Activity  .  Alcohol use: Never  . Drug use: Not on file  . Sexual activity: Yes    Partners: Female  Other Topics Concern  . Not on file  Social History Narrative  . Not on file   Social Determinants of Health   Financial Resource Strain: Not on file  Food Insecurity: Not on file  Transportation Needs: Not on file  Physical Activity: Not on file  Stress: Not on file  Social Connections: Not on file  Intimate Partner Violence: Not on file   Family History:  Family History  Problem Relation Age of Onset  . Cancer Neg Hx     Review of Systems: Constitutional: Doesn't report fevers, chills or abnormal weight loss Eyes: Doesn't report blurriness of vision Ears, nose, mouth, throat, and face: Doesn't report sore throat Respiratory: Doesn't report cough, dyspnea or wheezes Cardiovascular: Doesn't report palpitation, chest discomfort  Gastrointestinal:  Doesn't report nausea, constipation, diarrhea GU: Doesn't report incontinence Skin: Doesn't report skin rashes Neurological: Per HPI Musculoskeletal: Doesn't report joint pain Behavioral/Psych: Doesn't report anxiety  Physical Exam: Vitals:   01/16/21 1016  BP: 107/71  Pulse: 65  Resp: 17  Temp: 98.4 F (36.9 C)  SpO2: 100%   KPS: 90. General: Alert, cooperative, pleasant, in no acute distress Head: Normal EENT: No conjunctival injection or scleral icterus.  Lungs: Resp effort normal Cardiac: Regular rate Abdomen: Non-distended abdomen Skin: No rashes cyanosis or petechiae. Extremities: No clubbing or edema  Neurologic Exam: Mental Status: Awake, alert, attentive to examiner. Oriented to self and environment. Language is fluent with intact comprehension.  Cranial Nerves: Visual acuity is grossly normal. Visual fields are full. Extra-ocular movements intact. No ptosis. Face is symmetric Motor: Tone and bulk are normal. Power is full in both arms and legs. Reflexes are symmetric, no pathologic reflexes present.  Sensory: Intact  to light touch Gait: Normal.   Labs: I have reviewed the data as listed    Component Value Date/Time   NA 140 12/19/2020 0848   K 4.6 12/19/2020 0848   CL 99 12/19/2020 0848   CO2 31 12/19/2020 0848   GLUCOSE 83 12/19/2020 0848   BUN 8 12/19/2020 0848   CREATININE 0.79 12/19/2020 0848   CALCIUM 9.4 12/19/2020 0848   PROT 7.1 12/19/2020 0848   ALBUMIN 4.5 12/19/2020 0848   AST 15 12/19/2020 0848   ALT 9 12/19/2020 0848   ALKPHOS 77 12/19/2020 0848   BILITOT 0.4 12/19/2020 0848   GFRNONAA >60 12/19/2020 0848   Lab Results  Component Value Date   WBC 3.5 (L) 01/16/2021   NEUTROABS 2.2 01/16/2021   HGB 14.6 01/16/2021   HCT 42.7 01/16/2021   MCV 85.4 01/16/2021   PLT 203 01/16/2021   Imaging:  [place] Clinician Interpretation: I have personally reviewed the CNS images as listed.  My interpretation, in the context of the patient's clinical presentation, is treatment effect vs true progression  MR BRAIN W WO CONTRAST  Result Date: 01/03/2021 CLINICAL DATA:  Brain/CNS neoplasm, assess treatment response. EXAM: MRI HEAD WITHOUT AND WITH CONTRAST TECHNIQUE: Multiplanar, multiecho pulse sequences of the brain and surrounding structures were obtained without and with intravenous contrast. CONTRAST:  5.2m GADAVIST GADOBUTROL 1 MMOL/ML IV SOLN COMPARISON:  MRI head November 13, 2020. FINDINGS: Brain: Left anterior temporal lobe of CSF intensity resection cavity is similar in size. Possible slight increase in T2/FLAIR hyperintensity surrounding the resection cavity which extends posteriorly to involve the hippocampus (for example series 8, images 11 and 12 and series 11, image 22 and 23). Similar size of the cystic, CSF intensity resection cavity. There is slight increase in bulk of enhancement along the posterior aspect of the resection cavity (for example, series 16, image 64 and series 18, image 21). No evidence of acute infarct or restricted diffusion. No hydrocephalus. No acute hemorrhage.  Similar small scattered T2/FLAIR hyperintensities in the white matter, likely mild chronic microvascular ischemic disease. Vascular: Major arterial flow voids are maintained at the skull base. Similar small right vertebral artery. Skull and upper cervical spine: Normal marrow signal. Sinuses/Orbits: No substantial sinus disease.  Unremarkable orbits. Other: Moderate left mastoid effusion. IMPRESSION: 1. Possible slight increase in T2/FLAIR hyperintensity surrounding the resection cavity. Additionally, there is slight increase in bulk of linear enhancement along the posterior aspect of the resection cavity. These findings could represent treatment related change or slight progression of tumor. Recommend short interval follow-up. 2. Otherwise, similar appearance of the resection cavity including linear, likely postoperative enhancement posteriorly. 3. Moderate left mastoid effusion. Electronically Signed   By: FMargaretha SheffieldMD   On: 01/03/2021 09:28   Assessment/Plan Glioblastoma with isocitrate dehydrogenase  gene wildtype ([place]) [C71.9]  Merick Poage is clinically stable today, now having completed 2 cycles of 5-day Temozolomide.  MRI demonstrates thickening band of enhancement along superior aspect of resection cavity.  Etiology is either reactive to radiation or early progression of tumor, favor the former at this time.  Labs are again within normal limits.  We recommended continuing treatment with Temozolomide, cycle #3 at 254m/m2, on for five days and off for twenty three days in twenty eight day cycles. The patient will have a complete blood count performed on days 21 and 28 of each cycle, and a comprehensive metabolic panel performed on day 28 of each cycle. Labs may need to be performed more often. Zofran will prescribed for home use for nausea/vomiting.   Chemotherapy should be held for the following:  ANC less than 1,000  Platelets less than 100,000  LFT or creatinine greater than 2x  ULN  If clinical concerns/contraindications develop  Keppra will stay at 7554mBID.  We ask that EuAbran Gavigan[place]urn to clinic in 1 months with labs for evaluation prior to cycle #4, or sooner as needed.  Next MRI can be in 2 months.  We appreciate the opportunity to participate in the care of [place]   All questions were answered. The patient knows to call the clinic with any problems, questions or concerns. No barriers to learning were detected.  The total time spent in the encounter was 40 minutes and more than 50% was on counseling and review of test results   ZaVentura SellersMD Medical Director of Neuro-Oncology [facility]6/07/22 10:15 AM

## 2021-01-17 ENCOUNTER — Other Ambulatory Visit (HOSPITAL_COMMUNITY): Payer: Self-pay

## 2021-01-18 ENCOUNTER — Other Ambulatory Visit (HOSPITAL_COMMUNITY): Payer: Self-pay

## 2021-02-06 ENCOUNTER — Other Ambulatory Visit (HOSPITAL_COMMUNITY): Payer: Self-pay

## 2021-02-08 ENCOUNTER — Other Ambulatory Visit (HOSPITAL_COMMUNITY): Payer: Self-pay

## 2021-02-15 ENCOUNTER — Other Ambulatory Visit: Payer: Self-pay

## 2021-02-15 ENCOUNTER — Inpatient Hospital Stay (HOSPITAL_BASED_OUTPATIENT_CLINIC_OR_DEPARTMENT_OTHER): Payer: Medicaid Other | Admitting: Internal Medicine

## 2021-02-15 ENCOUNTER — Other Ambulatory Visit (HOSPITAL_COMMUNITY): Payer: Self-pay

## 2021-02-15 ENCOUNTER — Inpatient Hospital Stay: Payer: Medicaid Other | Attending: Internal Medicine

## 2021-02-15 VITALS — BP 112/57 | HR 52 | Temp 98.2°F | Resp 18 | Wt 136.3 lb

## 2021-02-15 DIAGNOSIS — Z79899 Other long term (current) drug therapy: Secondary | ICD-10-CM | POA: Diagnosis not present

## 2021-02-15 DIAGNOSIS — C719 Malignant neoplasm of brain, unspecified: Secondary | ICD-10-CM

## 2021-02-15 DIAGNOSIS — C712 Malignant neoplasm of temporal lobe: Secondary | ICD-10-CM | POA: Insufficient documentation

## 2021-02-15 DIAGNOSIS — Z9221 Personal history of antineoplastic chemotherapy: Secondary | ICD-10-CM | POA: Insufficient documentation

## 2021-02-15 DIAGNOSIS — Z923 Personal history of irradiation: Secondary | ICD-10-CM | POA: Insufficient documentation

## 2021-02-15 DIAGNOSIS — Z7952 Long term (current) use of systemic steroids: Secondary | ICD-10-CM | POA: Diagnosis not present

## 2021-02-15 DIAGNOSIS — R569 Unspecified convulsions: Secondary | ICD-10-CM

## 2021-02-15 DIAGNOSIS — R42 Dizziness and giddiness: Secondary | ICD-10-CM | POA: Insufficient documentation

## 2021-02-15 DIAGNOSIS — R112 Nausea with vomiting, unspecified: Secondary | ICD-10-CM | POA: Insufficient documentation

## 2021-02-15 LAB — CMP (CANCER CENTER ONLY)
ALT: 9 U/L (ref 0–44)
AST: 15 U/L (ref 15–41)
Albumin: 4.2 g/dL (ref 3.5–5.0)
Alkaline Phosphatase: 63 U/L (ref 38–126)
Anion gap: 8 (ref 5–15)
BUN: 9 mg/dL (ref 6–20)
CO2: 30 mmol/L (ref 22–32)
Calcium: 9.4 mg/dL (ref 8.9–10.3)
Chloride: 102 mmol/L (ref 98–111)
Creatinine: 0.79 mg/dL (ref 0.61–1.24)
GFR, Estimated: >60 mL/min (ref >60)
Glucose, Bld: 78 mg/dL (ref 70–99)
Potassium: 4.5 mmol/L (ref 3.5–5.1)
Sodium: 140 mmol/L (ref 135–145)
Total Bilirubin: 0.8 mg/dL (ref 0.3–1.2)
Total Protein: 6.8 g/dL (ref 6.5–8.1)

## 2021-02-15 LAB — CBC WITH DIFFERENTIAL (CANCER CENTER ONLY)
Abs Immature Granulocytes: 0 10*3/uL (ref 0.00–0.07)
Basophils Absolute: 0 10*3/uL (ref 0.0–0.1)
Basophils Relative: 1 %
Eosinophils Absolute: 0.1 10*3/uL (ref 0.0–0.5)
Eosinophils Relative: 3 %
HCT: 39.9 % (ref 39.0–52.0)
Hemoglobin: 13.7 g/dL (ref 13.0–17.0)
Immature Granulocytes: 0 %
Lymphocytes Relative: 25 %
Lymphs Abs: 0.7 10*3/uL (ref 0.7–4.0)
MCH: 29.5 pg (ref 26.0–34.0)
MCHC: 34.3 g/dL (ref 30.0–36.0)
MCV: 86 fL (ref 80.0–100.0)
Monocytes Absolute: 0.4 10*3/uL (ref 0.1–1.0)
Monocytes Relative: 14 %
Neutro Abs: 1.6 10*3/uL — ABNORMAL LOW (ref 1.7–7.7)
Neutrophils Relative %: 57 %
Platelet Count: 194 10*3/uL (ref 150–400)
RBC: 4.64 MIL/uL (ref 4.22–5.81)
RDW: 13.4 % (ref 11.5–15.5)
WBC Count: 2.8 10*3/uL — ABNORMAL LOW (ref 4.0–10.5)
nRBC: 0 % (ref 0.0–0.2)

## 2021-02-15 MED ORDER — TEMOZOLOMIDE 180 MG PO CAPS
180.0000 mg | ORAL_CAPSULE | Freq: Every day | ORAL | 0 refills | Status: DC
  Filled 2021-02-15: qty 5, 5d supply, fill #0
  Filled 2021-02-16: qty 5, 28d supply, fill #0

## 2021-02-15 MED ORDER — TEMOZOLOMIDE 140 MG PO CAPS
140.0000 mg | ORAL_CAPSULE | Freq: Every day | ORAL | 0 refills | Status: DC
  Filled 2021-02-15: qty 14, 14d supply, fill #0
  Filled 2021-02-16: qty 5, 28d supply, fill #0

## 2021-02-15 NOTE — Progress Notes (Signed)
[facility] at [place], [place] [address] [phone]   Interval Evaluation  Date of Service: 02/15/21 [name] Patient MRN: [id number] Patient DOB: 1961/01/23 Provider: Ventura Sellers, MD  Identifying Statement:  Hunter Terry is a 60 y.o. 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 [name]; path reveals glioblastoma IDH-wt   08/02/2020 Surgery   Debulking craniotomy with Dr. [name]   Glioblastoma [facility])  06/15/2020 Surgery   Stereotactic biopsy, L temporal [name]).  Path demonstrates glioblastoma IDH-1 wt   08/02/2020 Surgery   Debulking craniotomy with Dr. [name]   09/05/2020 - 10/17/2020 Radiation Therapy   IMRT with concurrent Temozolomide [name])   11/19/2020 -  Chemotherapy   Initiates cycle #1 adjuvant Temozolomide         Biomarkers:  MGMT Unknown.  IDH 1/2 Wild type.  EGFR Unknown  TERT "Mutated   Interval History:  Hunter Terry presents today for follow up after third cycle of adjuvant Temodar.  No issues tolerating the chemo, fatigue level is stable. Denies new or progressive deficits. No balance issues, motor dysfunction.  Continues on Keppra. Fully functional at home with his wife.  H+P (07/24/20) Patient presented to medical attention in late October, 2021 with new onset seizure.  Event was decribed as loss of consciousness, sudden, without clarity on further details aside from altered mental status upon awakening.  CNS imaging demonsrated non-enhancing mass within left temporal lobe c/w likely glioma; he underwent stereotactic biopsy with Dr. [name] on 06/15/20.  There was significant delay in finalizing path, explaining delay in follow up  and evaluation.  He denies any seizures since discharge from hospital, on 4 anti-seizure drugs.  He does complain of dizziness and drowsiness with the medications, however.  Also describes impaired short term memory.  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    Medications: Current Outpatient Medications on File Prior to Visit  Medication Sig Dispense Refill   levETIRAcetam (KEPPRA) 750 MG tablet Take 1 tablet (750 mg total) by mouth 2 (two) times daily. 60 tablet 0   ondansetron (ZOFRAN) 8 MG tablet Take 1 tablet (8 mg total) by mouth 2 (two) times daily as needed (nausea and vomiting). May take 30-60 minutes prior to Temodar administration if nausea/vomiting occurs. 30 tablet 1   Carboxymethylcellul-Glycerin (LUBRICATING EYE DROPS OP) Place 1 drop into both eyes daily as needed (dry eyes).     clonazePAM (KLONOPIN) 1 MG tablet Take 1 tablet (1 mg total) by mouth 2 (two) times daily. 90 tablet 1   dexamethasone (DECADRON) 2 MG tablet 2 TABS 3 TIMES DAILY X2 DAYS, THEN 1 TAB 3TIMES DAILY X2 DAYS, 1 TAB TWICE DAILY X 2 DAYS, 1/2 TAB TWICE DAILY X2 DAYS, THEN 1/2 TAB DAILY X2 DAYS (Patient not taking: No sig reported) 25 tablet 0   HYDROcodone-acetaminophen (NORCO/VICODIN) 5-325 MG tablet Take 1 tablet by mouth every 4 (four) hours as needed for moderate pain. (Patient not taking: No sig reported) 20 tablet 0   oxymetazoline (AFRIN) 0.05 % nasal spray Place 1 spray into both nostrils 2 (two) times daily as needed for congestion. (Patient not taking: No sig reported)     phenytoin (DILANTIN) 100 MG ER capsule Take 1 capsule (100 mg total)  by mouth 2 (two) times daily. (Patient not taking: No sig reported) 60 capsule 11   temozolomide (TEMODAR) 100 MG capsule Take 1 capsule (100 mg total) by mouth daily. May take on an empty stomach to decrease nausea & vomiting. (Patient not taking: No sig reported) 5 capsule 0   temozolomide (TEMODAR) 140 MG capsule Take 1 capsule (140 mg total) by mouth daily.  May take on an empty stomach to decrease nausea & vomiting. (Patient not taking: Reported on 02/15/2021) 5 capsule 0   temozolomide (TEMODAR) 180 MG capsule Take 1 capsule (180 mg total) by mouth daily. May take on an empty stomach to decrease nausea & vomiting. (Patient not taking: Reported on 02/15/2021) 5 capsule 0   [DISCONTINUED] lacosamide (VIMPAT) 200 MG TABS tablet Take 0.5 tablets (100 mg total) by mouth 2 (two) times daily. 30 tablet 0   No current facility-administered medications on file prior to visit.    Allergies: No Known Allergies Past Medical History:  Past Medical History:  Diagnosis Date   Cancer ([place])    BRAIN TUMOR   Headache    Seizure [facility])    Past Surgical History:  Past Surgical History:  Procedure Laterality Date   APPLICATION OF CRANIAL NAVIGATION N/A 06/15/2020   Procedure: APPLICATION OF CRANIAL NAVIGATION;  Surgeon: [name], MD;  Location: [place];  Service: Neurosurgery;  Laterality: N/A;   APPLICATION OF CRANIAL NAVIGATION N/A 08/02/2020   Procedure: APPLICATION OF CRANIAL NAVIGATION;  Surgeon: [name], MD;  Location: [place];  Service: Neurosurgery;  Laterality: N/A;   CRANIOTOMY N/A 08/02/2020   Procedure: CRANIOTOMY LEFT TEMPORAL LOBECTOMY FOR TUMOR EXCISION;  Surgeon: [name], MD;  Location: [place];  Service: Neurosurgery;  Laterality: N/A;   FRAMELESS  BIOPSY WITH BRAINLAB Left 06/15/2020   Procedure: Left temporal lobe stereotactic brain biopsy with brainlab;  Surgeon: [name], MD;  Location: [place];  Service: Neurosurgery;  Laterality: Left;   Social History:  Social History   Socioeconomic History   Marital status: Married    Spouse name: Not on file   Number of children: Not on file   Years of education: Not on file   Highest education level: Not on file  Occupational History   Not on file  Tobacco Use   Smoking status: Never   Smokeless tobacco: Never  Substance and Sexual Activity   Alcohol use:  Never   Drug use: Not on file   Sexual activity: Yes    Partners: Female  Other Topics Concern   Not on file  Social History Narrative   Not on file   Social Determinants of Health   Financial Resource Strain: Not on file  Food Insecurity: Not on file  Transportation Needs: Not on file  Physical Activity: Not on file  Stress: Not on file  Social Connections: Not on file  Intimate Partner Violence: Not on file   Family History:  Family History  Problem Relation Age of Onset   Cancer Neg Hx     Review of Systems: Constitutional: Doesn't report fevers, chills or abnormal weight loss Eyes: Doesn't report blurriness of vision Ears, nose, mouth, throat, and face: Doesn't report sore throat Respiratory: Doesn't report cough, dyspnea or wheezes Cardiovascular: Doesn't report palpitation, chest discomfort  Gastrointestinal:  Doesn't report nausea, constipation, diarrhea GU: Doesn't report incontinence Skin: Doesn't report skin rashes Neurological: Per HPI Musculoskeletal: Doesn't report joint pain Behavioral/Psych: Doesn't report anxiety  Physical Exam: Vitals:   02/15/21  1038  BP: (!) 112/57  Pulse: (!) 52  Resp: 18  Temp: 98.2 F (36.8 C)  SpO2: 100%   KPS: 90. General: Alert, cooperative, pleasant, in no acute distress Head: Normal EENT: No conjunctival injection or scleral icterus.  Lungs: Resp effort normal Cardiac: Regular rate Abdomen: Non-distended abdomen Skin: No rashes cyanosis or petechiae. Extremities: No clubbing or edema  Neurologic Exam: Mental Status: Awake, alert, attentive to examiner. Oriented to self and environment. Language is fluent with intact comprehension.  Cranial Nerves: Visual acuity is grossly normal. Visual fields are full. Extra-ocular movements intact. No ptosis. Face is symmetric Motor: Tone and bulk are normal. Power is full in both arms and legs. Reflexes are symmetric, no pathologic reflexes present.  Sensory: Intact to light  touch Gait: Normal.   Labs: I have reviewed the data as listed    Component Value Date/Time   NA 141 01/16/2021 1000   K 4.1 01/16/2021 1000   CL 102 01/16/2021 1000   CO2 32 01/16/2021 1000   GLUCOSE 86 01/16/2021 1000   BUN 8 01/16/2021 1000   CREATININE 0.77 01/16/2021 1000   CALCIUM 9.6 01/16/2021 1000   PROT 7.0 01/16/2021 1000   ALBUMIN 4.2 01/16/2021 1000   AST 13 (L) 01/16/2021 1000   ALT 9 01/16/2021 1000   ALKPHOS 66 01/16/2021 1000   BILITOT 0.5 01/16/2021 1000   GFRNONAA >60 01/16/2021 1000   Lab Results  Component Value Date   WBC 2.8 (L) 02/15/2021   NEUTROABS 1.6 (L) 02/15/2021   HGB 13.7 02/15/2021   HCT 39.9 02/15/2021   MCV 86.0 02/15/2021   PLT 194 02/15/2021    Assessment/Plan 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   Labs are again within normal limits, no new or progressive deficits.  We recommended continuing treatment with Temozolomide, cycle #4 at $Rem'200mg'RTxm$ /m2, on for five days and off for twenty three days in twenty eight day cycles. The patient will have a complete blood count performed on days 21 and 28 of each cycle, and a comprehensive metabolic panel performed on day 28 of each cycle. Labs may need to be performed more often. Zofran will prescribed for home use for nausea/vomiting.   Chemotherapy should be held for the following:  ANC less than 1,000  Platelets less than 100,000  LFT or creatinine greater than 2x ULN  If clinical concerns/contraindications develop  Keppra will stay at $Remov'750mg'ZEZsnm$  BID.  We ask that Hunter Terry return to clinic in 1 months with MRI brain for evaluation prior to cycle #4, or sooner as needed.    We appreciate the opportunity to participate in the care of [place].    All questions were answered. The patient knows to call the clinic with any problems, questions or concerns.  No barriers to learning were detected.  The total time spent in the encounter was 40 minutes and more than 50% was on counseling and review of test results   Ventura Sellers, MD Medical Director of Neuro-Oncology [facility] [place] 02/15/21 10:54 AM

## 2021-02-16 ENCOUNTER — Other Ambulatory Visit (HOSPITAL_COMMUNITY): Payer: Self-pay

## 2021-02-19 ENCOUNTER — Other Ambulatory Visit (HOSPITAL_COMMUNITY): Payer: Self-pay

## 2021-02-20 ENCOUNTER — Other Ambulatory Visit (HOSPITAL_COMMUNITY): Payer: Self-pay

## 2021-03-06 ENCOUNTER — Other Ambulatory Visit (HOSPITAL_COMMUNITY): Payer: Self-pay

## 2021-03-08 ENCOUNTER — Other Ambulatory Visit (HOSPITAL_COMMUNITY): Payer: Self-pay

## 2021-03-12 ENCOUNTER — Other Ambulatory Visit: Payer: Self-pay | Admitting: Internal Medicine

## 2021-03-12 ENCOUNTER — Other Ambulatory Visit (HOSPITAL_COMMUNITY): Payer: Self-pay

## 2021-03-12 DIAGNOSIS — C719 Malignant neoplasm of brain, unspecified: Secondary | ICD-10-CM

## 2021-03-13 ENCOUNTER — Other Ambulatory Visit: Payer: Self-pay

## 2021-03-13 ENCOUNTER — Ambulatory Visit (HOSPITAL_COMMUNITY)
Admission: RE | Admit: 2021-03-13 | Discharge: 2021-03-13 | Disposition: A | Discharge status: Home / Self Care | Payer: Medicaid Other | Source: Ambulatory Visit | Attending: Internal Medicine | Admitting: Internal Medicine

## 2021-03-13 ENCOUNTER — Encounter: Payer: Self-pay | Admitting: Internal Medicine

## 2021-03-13 ENCOUNTER — Other Ambulatory Visit: Payer: Self-pay | Admitting: Radiation Therapy

## 2021-03-13 ENCOUNTER — Other Ambulatory Visit (HOSPITAL_COMMUNITY): Payer: Self-pay

## 2021-03-13 DIAGNOSIS — C719 Malignant neoplasm of brain, unspecified: Secondary | ICD-10-CM | POA: Insufficient documentation

## 2021-03-13 IMAGING — MR MR HEAD WO/W CM
13 series · 48 of 48 positions shown · IV contrast (gadavist)
Comparison: MRI of the brain [DATE].

CLINICAL DATA: Glioblastoma with isocitrate dehydrogenase gene
wildtype (HCC) [P7] ([P7]-CM). Brain/CNS neoplasm, assess
treatment response

EXAM:
MRI HEAD WITHOUT AND WITH CONTRAST
TECHNIQUE: Multiplanar, multiecho pulse sequences of the brain and surrounding
structures were obtained without and with intravenous contrast.
CONTRAST:  6mL GADAVIST GADOBUTROL 1 MMOL/ML IV SOLN

[Series 5: DWI · axial · 3.0mm · 1.36mm/px · z∈[-50,+109]mm · 6 of 108 slices shown (1 of 2)]
[im 1/108]
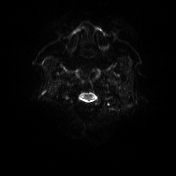
[im 22/108]
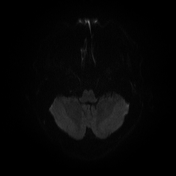
[im 43/108]
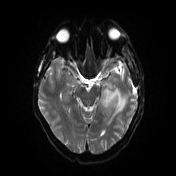
[im 65/108]
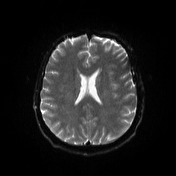
[im 86/108]
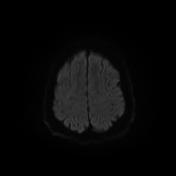
[im 108/108]
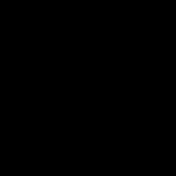

[Series 6: DWI · axial · 3.0mm · 1.36mm/px · z∈[-50,+106]mm · 3 of 52 slices shown (2 of 2)]
[im 1/52]
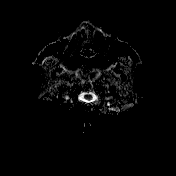
[im 26/52]
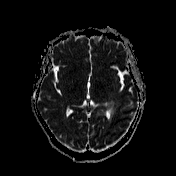
[im 52/52]
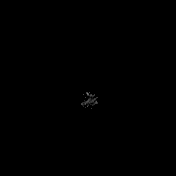

[Series 7: T1 · sagittal · 5.0mm · 0.75mm/px · 1 of 24 slices shown (1 of 2)]
[im 1/24]
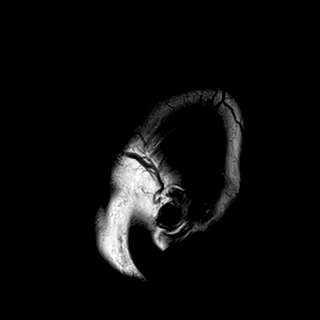

[Series 8: T2 · axial · 5.0mm · 0.62mm/px · z∈[-45,+116]mm · 2 of 26 slices shown]
[im 1/26]
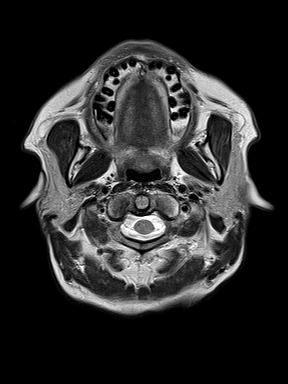
[im 26/26]
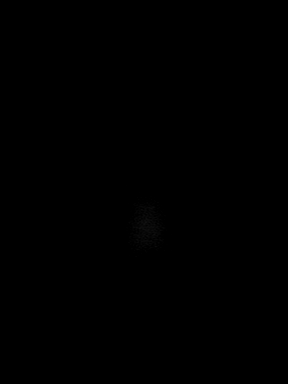

[Series 9: swi_images · axial · 3.0mm · 0.75mm/px · z∈[-46,+117]mm · 3 of 56 slices shown]
[im 1/56]
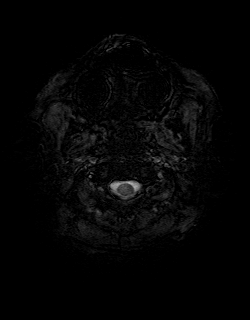
[im 28/56]
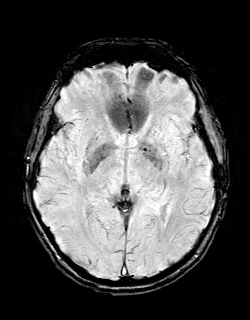
[im 56/56]
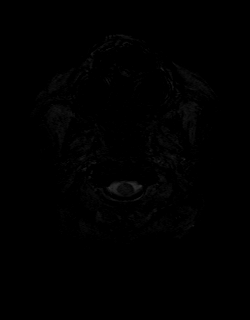

[Series 11: FLAIR · axial · 3.0mm · 0.75mm/px · z∈[-45,+116]mm · 3 of 55 slices shown]
[im 1/55]
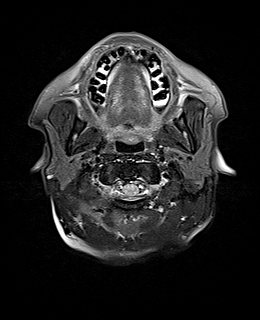
[im 28/55]
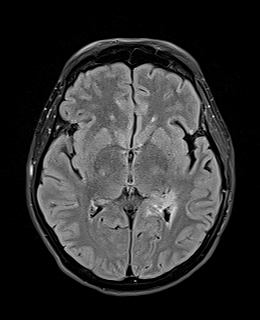
[im 55/55]
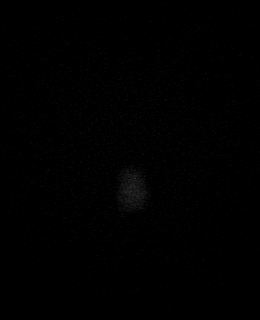

[Series 12: T1 · axial · 1.0mm · 0.94mm/px · z∈[-46,+112]mm · 10 of 160 slices shown (2 of 2)]
[im 1/160]
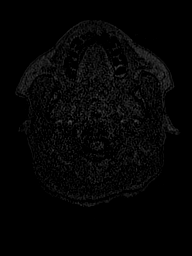
[im 18/160]
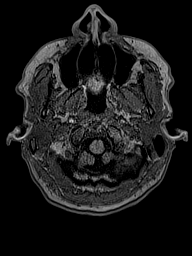
[im 36/160]
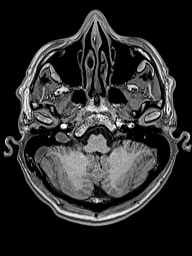
[im 54/160]
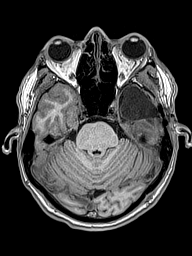
[im 71/160]
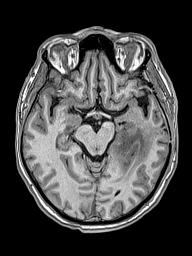
[im 89/160]
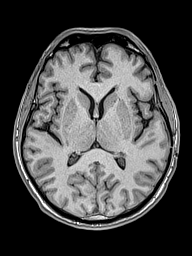
[im 107/160]
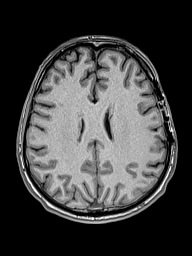
[im 124/160]
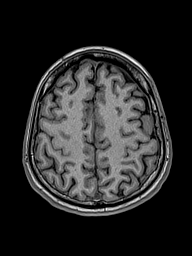
[im 142/160]
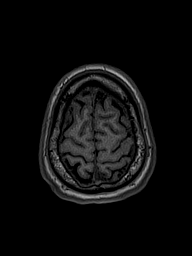
[im 160/160]
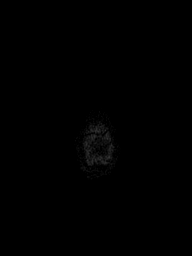

[Series 13: cor dwi_tracew · coronal · 5.0mm · 1.53mm/px · 3 of 52 slices shown]
[im 1/52]
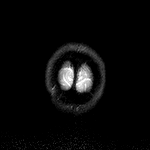
[im 26/52]
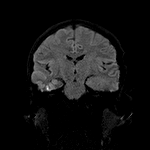
[im 52/52]
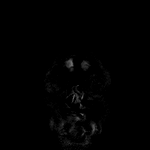

[Series 14: cor dwi_adc · coronal · 5.0mm · 1.53mm/px · 2 of 26 slices shown]
[im 1/26]
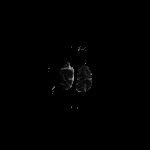
[im 26/26]
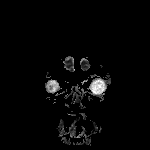

[Series 15: T2 post-contrast · coronal · 5.0mm · 0.57mm/px · 2 of 27 slices shown]
[im 1/27]
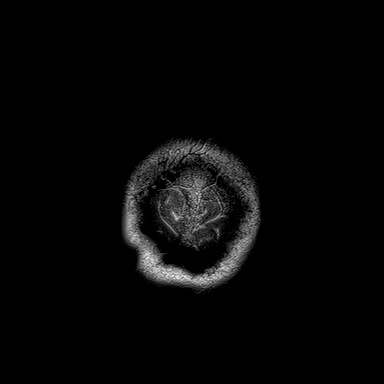
[im 27/27]
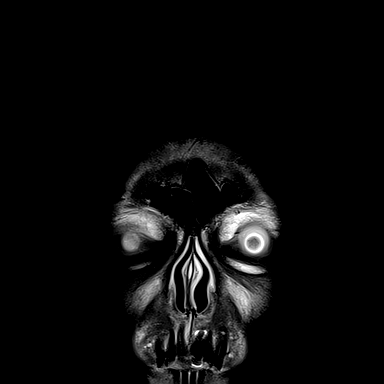

[Series 16: T1 post-contrast · axial · 1.0mm · 0.94mm/px · z∈[-46,+112]mm · 10 of 160 slices shown (1 of 3)]
[im 1/160]
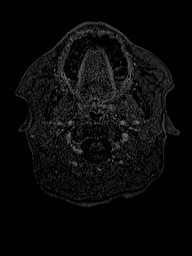
[im 18/160]
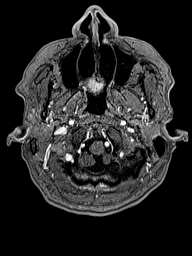
[im 36/160]
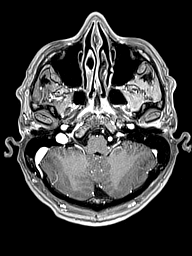
[im 54/160]
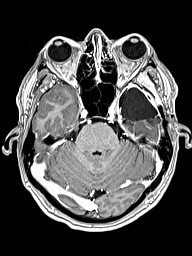
[im 71/160]
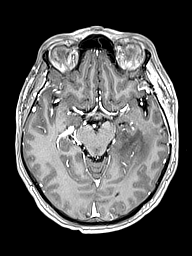
[im 89/160]
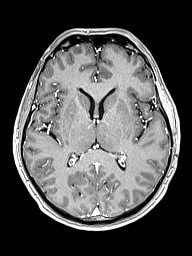
[im 107/160]
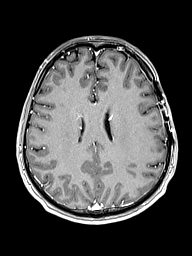
[im 124/160]
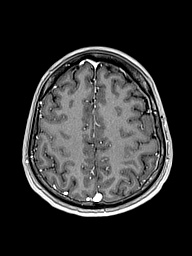
[im 142/160]
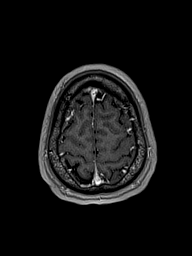
[im 160/160]
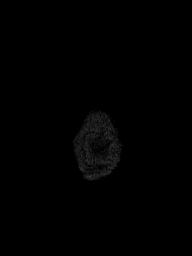

[Series 17: T1 post-contrast · coronal · 5.0mm · 0.43mm/px · 2 of 27 slices shown (2 of 3)]
[im 1/27]
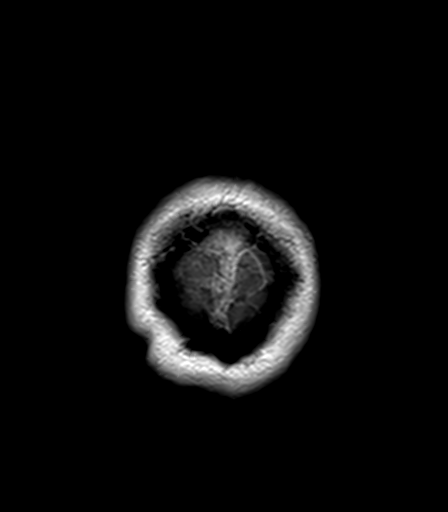
[im 27/27]
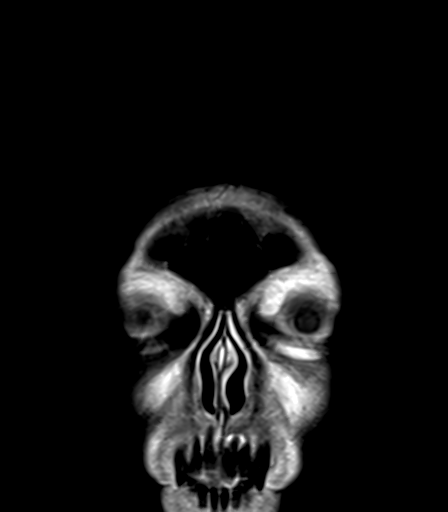

[Series 18: T1 post-contrast · sagittal · 5.0mm · 0.75mm/px · 1 of 24 slices shown (3 of 3)]
[im 1/24]
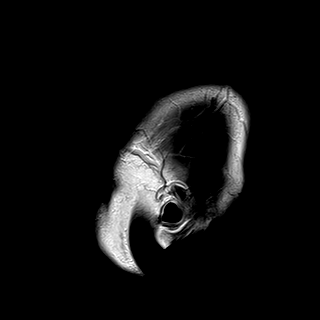

[48 of 48 positions shown; findings below may reference images not displayed]

FINDINGS: Brain: Postsurgical changes from left temporal craniotomy for tumor
debulking. Surgical cavity is again seen in the anterior left
temporal lobe. There is interval progression of the area of
increased T2 signal involving the left temporal lobe and along the
atrium of the left lateral ventricle with increased mass effect.
There is also progression of the contrast enhancement along the
margins of the surgical cavity with new areas of enhancement along
the inferior aspect of the left temporal lobe (series 17, image 12;
series 18, image 17). New foci of contrast enhancement are also seen
in the lateral aspect of the left temporal lobe (series 17, image
14) as well as in subependymal location along the inferior left
lateral ventricle (series 16, image 66 and 81; series 18, images 17
and 18). No hemorrhage, hydrocephalus or acute infarct.

Vascular: Normal flow voids.

Skull and upper cervical spine: Postsurgical changes from left
temporal craniotomy. No focal marrow lesion.

Sinuses/Orbits: Negative.
IMPRESSION: Interval progression of the area of increased T2 signal within the
left temporal lobe extending along the periventricular white matter
of the atrium of the left lateral ventricle with increased mass
effect and new foci of contrast enhancement along the surface of the
temporal lobe as well as subependymal. Presence of new subependymal
enhancing lesions raises concern for disease progression.

## 2021-03-13 MED ORDER — GADOBUTROL 1 MMOL/ML IV SOLN
6.0000 mL | Freq: Once | INTRAVENOUS | Status: AC | PRN
  Administered 2021-03-13: 6 mL via INTRAVENOUS

## 2021-03-13 NOTE — Telephone Encounter (Signed)
Patient has to have labs and see md prior to filing Temodar.  Appointment is already scheduled.

## 2021-03-19 ENCOUNTER — Inpatient Hospital Stay: Payer: Medicaid Other | Attending: Internal Medicine

## 2021-03-19 DIAGNOSIS — R42 Dizziness and giddiness: Secondary | ICD-10-CM | POA: Insufficient documentation

## 2021-03-19 DIAGNOSIS — C712 Malignant neoplasm of temporal lobe: Secondary | ICD-10-CM | POA: Insufficient documentation

## 2021-03-19 DIAGNOSIS — R400 Somnolence: Secondary | ICD-10-CM | POA: Insufficient documentation

## 2021-03-19 DIAGNOSIS — Z79899 Other long term (current) drug therapy: Secondary | ICD-10-CM | POA: Insufficient documentation

## 2021-03-19 DIAGNOSIS — Z923 Personal history of irradiation: Secondary | ICD-10-CM | POA: Insufficient documentation

## 2021-03-19 DIAGNOSIS — Z9221 Personal history of antineoplastic chemotherapy: Secondary | ICD-10-CM | POA: Insufficient documentation

## 2021-03-19 DIAGNOSIS — R112 Nausea with vomiting, unspecified: Secondary | ICD-10-CM | POA: Insufficient documentation

## 2021-03-21 ENCOUNTER — Other Ambulatory Visit (HOSPITAL_COMMUNITY): Payer: Self-pay

## 2021-03-22 ENCOUNTER — Inpatient Hospital Stay (HOSPITAL_BASED_OUTPATIENT_CLINIC_OR_DEPARTMENT_OTHER): Payer: Medicaid Other | Admitting: Internal Medicine

## 2021-03-22 ENCOUNTER — Other Ambulatory Visit: Payer: Self-pay

## 2021-03-22 ENCOUNTER — Other Ambulatory Visit (HOSPITAL_COMMUNITY): Payer: Self-pay

## 2021-03-22 ENCOUNTER — Inpatient Hospital Stay: Payer: Medicaid Other

## 2021-03-22 VITALS — BP 112/71 | HR 57 | Temp 97.9°F | Resp 18 | Ht 65.0 in | Wt 138.5 lb

## 2021-03-22 DIAGNOSIS — C712 Malignant neoplasm of temporal lobe: Secondary | ICD-10-CM | POA: Diagnosis present

## 2021-03-22 DIAGNOSIS — R400 Somnolence: Secondary | ICD-10-CM | POA: Diagnosis not present

## 2021-03-22 DIAGNOSIS — R569 Unspecified convulsions: Secondary | ICD-10-CM

## 2021-03-22 DIAGNOSIS — Z923 Personal history of irradiation: Secondary | ICD-10-CM | POA: Diagnosis not present

## 2021-03-22 DIAGNOSIS — R42 Dizziness and giddiness: Secondary | ICD-10-CM | POA: Diagnosis not present

## 2021-03-22 DIAGNOSIS — C719 Malignant neoplasm of brain, unspecified: Secondary | ICD-10-CM | POA: Diagnosis not present

## 2021-03-22 DIAGNOSIS — Z9221 Personal history of antineoplastic chemotherapy: Secondary | ICD-10-CM | POA: Diagnosis not present

## 2021-03-22 DIAGNOSIS — R112 Nausea with vomiting, unspecified: Secondary | ICD-10-CM | POA: Diagnosis not present

## 2021-03-22 DIAGNOSIS — Z79899 Other long term (current) drug therapy: Secondary | ICD-10-CM | POA: Diagnosis not present

## 2021-03-22 LAB — CMP (CANCER CENTER ONLY)
ALT: 10 U/L (ref 0–44)
AST: 14 U/L — ABNORMAL LOW (ref 15–41)
Albumin: 4.4 g/dL (ref 3.5–5.0)
Alkaline Phosphatase: 65 U/L (ref 38–126)
Anion gap: 8 (ref 5–15)
BUN: 6 mg/dL (ref 6–20)
CO2: 30 mmol/L (ref 22–32)
Calcium: 9.6 mg/dL (ref 8.9–10.3)
Chloride: 100 mmol/L (ref 98–111)
Creatinine: 0.78 mg/dL (ref 0.61–1.24)
GFR, Estimated: >60 mL/min (ref >60)
Glucose, Bld: 95 mg/dL (ref 70–99)
Potassium: 4.3 mmol/L (ref 3.5–5.1)
Sodium: 138 mmol/L (ref 135–145)
Total Bilirubin: 0.9 mg/dL (ref 0.3–1.2)
Total Protein: 7.1 g/dL (ref 6.5–8.1)

## 2021-03-22 LAB — CBC WITH DIFFERENTIAL (CANCER CENTER ONLY)
Abs Immature Granulocytes: 0.01 10*3/uL (ref 0.00–0.07)
Basophils Absolute: 0 10*3/uL (ref 0.0–0.1)
Basophils Relative: 0 %
Eosinophils Absolute: 0 10*3/uL (ref 0.0–0.5)
Eosinophils Relative: 1 %
HCT: 41.2 % (ref 39.0–52.0)
Hemoglobin: 14.2 g/dL (ref 13.0–17.0)
Immature Granulocytes: 0 %
Lymphocytes Relative: 11 %
Lymphs Abs: 0.7 10*3/uL (ref 0.7–4.0)
MCH: 29.6 pg (ref 26.0–34.0)
MCHC: 34.5 g/dL (ref 30.0–36.0)
MCV: 85.8 fL (ref 80.0–100.0)
Monocytes Absolute: 0.5 10*3/uL (ref 0.1–1.0)
Monocytes Relative: 9 %
Neutro Abs: 4.6 10*3/uL (ref 1.7–7.7)
Neutrophils Relative %: 79 %
Platelet Count: 194 10*3/uL (ref 150–400)
RBC: 4.8 MIL/uL (ref 4.22–5.81)
RDW: 13.1 % (ref 11.5–15.5)
WBC Count: 5.8 10*3/uL (ref 4.0–10.5)
nRBC: 0 % (ref 0.0–0.2)

## 2021-03-22 MED ORDER — TEMOZOLOMIDE 140 MG PO CAPS
140.0000 mg | ORAL_CAPSULE | Freq: Every day | ORAL | 0 refills | Status: DC
  Filled 2021-03-22: qty 5, 5d supply, fill #0

## 2021-03-22 MED ORDER — TEMOZOLOMIDE 180 MG PO CAPS
180.0000 mg | ORAL_CAPSULE | Freq: Every day | ORAL | 0 refills | Status: DC
  Filled 2021-03-22: qty 5, 5d supply, fill #0

## 2021-03-22 MED ORDER — DEXAMETHASONE 4 MG PO TABS
4.0000 mg | ORAL_TABLET | Freq: Every day | ORAL | 0 refills | Status: DC
Start: 2021-04-03 — End: 2021-04-17

## 2021-03-22 NOTE — Progress Notes (Signed)
[facility] [place], [place] [address] [phone]   Interval Evaluation  Date of Service: 03/22/21 [name] Patient MRN: [id number] Patient DOB: May 21, 1961 Provider: Ventura Sellers, MD  Identifying Statement:  Hunter Terry is a 60 y.o. male with left temporal glioblastoma   Oncologic History: Oncology History  Glioblastoma with isocitrate dehydrogenase gene wildtype ([place])  06/10/2020 Initial Diagnosis   Glioblastoma with isocitrate dehydrogenase gene wildtype ([place])   06/15/2020 Surgery   Left temporal biopsy with Dr. [name]; path reveals glioblastoma IDH-wt   08/02/2020 Surgery   Debulking craniotomy with Dr. [name]   Glioblastoma [facility])  06/15/2020 Surgery   Stereotactic biopsy, L temporal [name]).  Path demonstrates glioblastoma IDH-1 wt   08/02/2020 Surgery   Debulking craniotomy with Dr. [name]   09/05/2020 - 10/17/2020 Radiation Therapy   IMRT with concurrent Temozolomide [name])   11/19/2020 -  Chemotherapy   Initiates cycle #1 adjuvant Temozolomide         Biomarkers:  MGMT Unknown.  IDH 1/2 Wild type.  EGFR Unknown  TERT "Mutated   Interval History:  Hunter Terry presents today for follow up after fourth cycle of adjuvant Temodar.  No issues tolerating the chemo, fatigue level is stable. Denies new or progressive deficits. No balance issues, motor dysfunction.  Continues on Keppra. Brother is at bedside today.  H+P (07/24/20) Patient presented to medical attention in late October, 2021 with new onset seizure.  Event was decribed as loss of consciousness, sudden, without clarity on further details aside from altered mental status upon awakening.  CNS imaging demonsrated non-enhancing mass within left temporal lobe c/w likely glioma; he underwent stereotactic biopsy with Dr. [name] on 06/15/20.  There was significant delay in finalizing path, explaining delay in follow up and  evaluation.  He denies any seizures since discharge from hospital, on 4 anti-seizure drugs.  He does complain of dizziness and drowsiness with the medications, however.  Also describes impaired short term memory.  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    Medications: Current Outpatient Medications on File Prior to Visit  Medication Sig Dispense Refill   levETIRAcetam (KEPPRA) 750 MG tablet Take 1 tablet (750 mg total) by mouth 2 (two) times daily. 60 tablet 0   Carboxymethylcellul-Glycerin (LUBRICATING EYE DROPS OP) Place 1 drop into both eyes daily as needed (dry eyes).     ondansetron (ZOFRAN) 8 MG tablet Take 1 tablet (8 mg total) by mouth 2 (two) times daily as needed (nausea and vomiting). May take 30-60 minutes prior to Temodar administration if nausea/vomiting occurs. (Patient not taking: Reported on 03/22/2021) 30 tablet 1   oxymetazoline (AFRIN) 0.05 % nasal spray Place 1 spray into both nostrils 2 (two) times daily as needed for congestion. (Patient not taking: No sig reported)     [DISCONTINUED] lacosamide (VIMPAT) 200 MG TABS tablet Take 0.5 tablets (100 mg total) by mouth 2 (two) times daily. 30 tablet 0   No current facility-administered medications on file prior to visit.    Allergies: No Known Allergies Past Medical History:  Past Medical History:  Diagnosis Date   Cancer ([place])    BRAIN TUMOR   Headache    Seizure [facility])    Past Surgical History:  Past Surgical History:  Procedure Laterality Date   APPLICATION OF CRANIAL NAVIGATION N/A 06/15/2020   Procedure: APPLICATION OF CRANIAL NAVIGATION;  Surgeon: [name], MD;  Location: [place];  Service: Neurosurgery;  Laterality: N/A;   APPLICATION OF CRANIAL NAVIGATION N/A 08/02/2020   Procedure: APPLICATION OF CRANIAL NAVIGATION;  Surgeon: [name], MD;  Location: [place];  Service: Neurosurgery;  Laterality: N/A;   CRANIOTOMY N/A 08/02/2020   Procedure: CRANIOTOMY LEFT TEMPORAL LOBECTOMY FOR TUMOR  EXCISION;  Surgeon: [name], MD;  Location: [place];  Service: Neurosurgery;  Laterality: N/A;   FRAMELESS  BIOPSY WITH BRAINLAB Left 06/15/2020   Procedure: Left temporal lobe stereotactic brain biopsy with brainlab;  Surgeon: [name], MD;  Location: [place];  Service: Neurosurgery;  Laterality: Left;   Social History:  Social History   Socioeconomic History   Marital status: Married    Spouse name: Not on file   Number of children: Not on file   Years of education: Not on file   Highest education level: Not on file  Occupational History   Not on file  Tobacco Use   Smoking status: Never   Smokeless tobacco: Never  Substance and Sexual Activity   Alcohol use: Never   Drug use: Not on file   Sexual activity: Yes    Partners: Female  Other Topics Concern   Not on file  Social History Narrative   Not on file   Social Determinants of Health   Financial Resource Strain: Not on file  Food Insecurity: Not on file  Transportation Needs: Not on file  Physical Activity: Not on file  Stress: Not on file  Social Connections: Not on file  Intimate Partner Violence: Not on file   Family History:  Family History  Problem Relation Age of Onset   Cancer Neg Hx     Review of Systems: Constitutional: Doesn't report fevers, chills or abnormal weight loss Eyes: Doesn't report blurriness of vision Ears, nose, mouth, throat, and face: Doesn't report sore throat Respiratory: Doesn't report cough, dyspnea or wheezes Cardiovascular: Doesn't report palpitation, chest discomfort  Gastrointestinal:  Doesn't report nausea, constipation, diarrhea GU: Doesn't report incontinence Skin: Doesn't report skin rashes Neurological: Per HPI Musculoskeletal: Doesn't report joint pain Behavioral/Psych: Doesn't report anxiety  Physical Exam: Vitals:   03/22/21 1129  BP: 112/71  Pulse: (!) 57  Resp: 18  Temp: 97.9 F (36.6 C)  SpO2: 100%   KPS: 90. General: Alert,  cooperative, pleasant, in no acute distress Head: Normal EENT: No conjunctival injection or scleral icterus.  Lungs: Resp effort normal Cardiac: Regular rate Abdomen: Non-distended abdomen Skin: No rashes cyanosis or petechiae. Extremities: No clubbing or edema  Neurologic Exam: Mental Status: Awake, alert, attentive to examiner. Oriented to self and environment. Language is fluent with intact comprehension.  Cranial Nerves: Visual acuity is grossly normal. Visual fields are full. Extra-ocular movements intact. No ptosis. Face is symmetric Motor: Tone and bulk are normal. Power is full in both arms and legs. Reflexes are symmetric, no pathologic reflexes present.  Sensory: Intact to light touch Gait: Normal.   Labs: I have reviewed the data as listed    Component Value Date/Time   NA 138 03/22/2021 1100   K 4.3 03/22/2021 1100   CL 100 03/22/2021 1100   CO2 30 03/22/2021 1100   GLUCOSE 95 03/22/2021 1100   BUN 6 03/22/2021 1100   CREATININE 0.78 03/22/2021 1100   CALCIUM 9.6 03/22/2021 1100   PROT 7.1 03/22/2021 1100   ALBUMIN 4.4 03/22/2021 1100   AST 14 (L) 03/22/2021 1100   ALT 10 03/22/2021 1100   ALKPHOS 65 03/22/2021 1100   BILITOT 0.9 03/22/2021 1100  GFRNONAA >60 03/22/2021 1100   Lab Results  Component Value Date   WBC 5.8 03/22/2021   NEUTROABS 4.6 03/22/2021   HGB 14.2 03/22/2021   HCT 41.2 03/22/2021   MCV 85.8 03/22/2021   PLT 194 03/22/2021   Imaging:  [place] Clinician Interpretation: I have personally reviewed the CNS images as listed.  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 Brain/CNS neoplasm, assess treatment response EXAM: MRI HEAD WITHOUT AND WITH CONTRAST TECHNIQUE: Multiplanar, multiecho pulse sequences of the brain and  surrounding structures were obtained without and with intravenous contrast. CONTRAST:  19mL GADAVIST GADOBUTROL 1 MMOL/ML IV SOLN COMPARISON:  MRI of the brain Jan 02, 2021. FINDINGS: Brain: Postsurgical changes from left temporal craniotomy for tumor debulking. Surgical cavity is again seen in the anterior left temporal lobe. There is interval progression of the area of increased T2 signal involving the left temporal lobe and along the atrium of the left lateral ventricle with increased mass effect. There is also progression of the contrast enhancement along the margins of the surgical cavity with new areas of enhancement along the inferior aspect of the left temporal lobe (series 17, image 12; series 18, image 17). New foci of contrast enhancement are also seen in the lateral aspect of the left temporal lobe (series 17, image 14) as well as in subependymal location along the inferior left lateral ventricle (series 16, image 66 and 81; series 18, images 17 and 18). No hemorrhage, hydrocephalus or acute infarct. Vascular: Normal flow voids. Skull and upper cervical spine: Postsurgical changes from left temporal craniotomy. No focal marrow lesion. Sinuses/Orbits: Negative. IMPRESSION: Interval progression of the area of increased T2 signal within the left temporal lobe extending along the periventricular white matter of the atrium of the left lateral ventricle with increased mass effect and new foci of contrast enhancement along the surface of the temporal lobe as well as subependymal. Presence of new subependymal enhancing lesions raises concern for disease progression. Electronically Signed   By: Pedro Earls M.D.   On: 03/13/2021 19:54     Assessment/Plan Glioblastoma ([place]) [C71.9]  Damar Benninger is clinically stable today, now having completed 4 cycles of 5-day Temozolomide.  MRI demonstrates progression of disease, with two novel foci of disease.  Both are within the high dose radiation  treatment zone, and could be reflective of pseudo-progression. Alternately, they could represent organic tumor progression.  We recommended following this change closely, repeating an MRI in 1 month for better characterization.  He will dose decadron $RemoveBefor'4mg'AMiYxnArfvbd$  daily starting 10 days before the MRI (on 8/23)  In the meantime, we recommended continuing treatment with Temozolomide, cycle #5 at $Rem'200mg'XURJ$ /m2, on for five days and off for twenty three days in twenty eight day cycles. The patient will have a complete blood count performed on days 21 and 28 of each cycle, and a comprehensive metabolic panel performed on day 28 of each cycle. Labs may need to be performed more often. Zofran will prescribed for home use for nausea/vomiting.   Chemotherapy should be held for the following:  ANC less than 1,000  Platelets less than 100,000  LFT or creatinine greater than 2x ULN  If clinical concerns/contraindications develop  Keppra will stay at $Remov'750mg'hMoiTs$  BID.  We ask that Lido Maske return to clinic in 1 months following next  brain MRI.  We appreciate the opportunity to participate in the care of [place].    All questions were answered. The patient knows to call the clinic with any problems, questions or concerns. No barriers to learning were detected.  The total time spent in the encounter was 40 minutes and more than 50% was on counseling and review of test results   Ventura Sellers, MD Medical Director of Neuro-Oncology [facility] at [place] 03/22/21 11:46 AM

## 2021-03-23 ENCOUNTER — Other Ambulatory Visit (HOSPITAL_COMMUNITY): Payer: Self-pay

## 2021-03-23 ENCOUNTER — Telehealth: Payer: Self-pay | Admitting: Internal Medicine

## 2021-03-23 NOTE — Telephone Encounter (Signed)
Scheduled appts per 8/11 los. Pt aware.  

## 2021-04-03 ENCOUNTER — Other Ambulatory Visit: Payer: Self-pay | Admitting: Radiation Therapy

## 2021-04-11 ENCOUNTER — Other Ambulatory Visit (HOSPITAL_COMMUNITY): Payer: Self-pay

## 2021-04-12 ENCOUNTER — Other Ambulatory Visit (HOSPITAL_COMMUNITY): Payer: Self-pay

## 2021-04-12 ENCOUNTER — Other Ambulatory Visit: Payer: Self-pay | Admitting: Internal Medicine

## 2021-04-12 ENCOUNTER — Other Ambulatory Visit: Payer: Self-pay

## 2021-04-12 ENCOUNTER — Ambulatory Visit (HOSPITAL_COMMUNITY)
Admission: RE | Admit: 2021-04-12 | Discharge: 2021-04-12 | Disposition: A | Discharge status: Home / Self Care | Payer: Medicaid Other | Source: Ambulatory Visit | Attending: Internal Medicine | Admitting: Internal Medicine

## 2021-04-12 DIAGNOSIS — C719 Malignant neoplasm of brain, unspecified: Secondary | ICD-10-CM | POA: Insufficient documentation

## 2021-04-12 IMAGING — MR MR HEAD WO/W CM
13 series · 48 of 48 positions shown · IV contrast (gadavist)
Comparison: 6 mL Gadavist

CLINICAL DATA: Glioblastoma, follow-up

EXAM:
MRI HEAD WITHOUT AND WITH CONTRAST
TECHNIQUE: Multiplanar, multiecho pulse sequences of the brain and surrounding
structures were obtained without and with intravenous contrast.
CONTRAST:  6mL GADAVIST GADOBUTROL 1 MMOL/ML IV SOLN

[Series 5: DWI · axial · 3.0mm · 1.36mm/px · z∈[-55,+97]mm · 5 of 103 slices shown (1 of 2)]
[im 1/103]
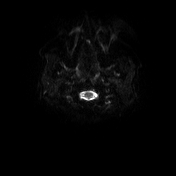
[im 26/103]
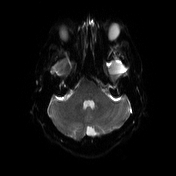
[im 52/103]
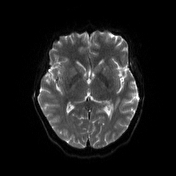
[im 77/103]
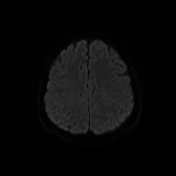
[im 103/103]
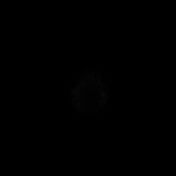

[Series 6: DWI · axial · 3.0mm · 1.36mm/px · z∈[-55,+97]mm · 3 of 49 slices shown (2 of 2)]
[im 1/49]
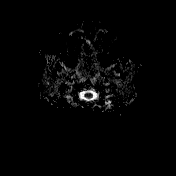
[im 25/49]
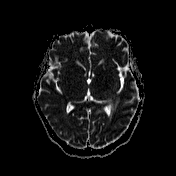
[im 49/49]
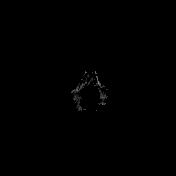

[Series 7: T1 · sagittal · 5.0mm · 0.75mm/px · 2 of 26 slices shown (1 of 2)]
[im 1/26]
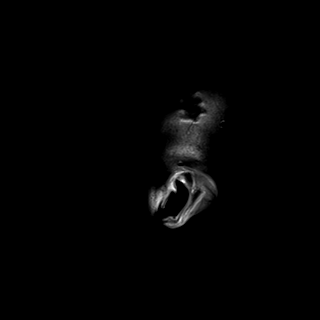
[im 26/26]
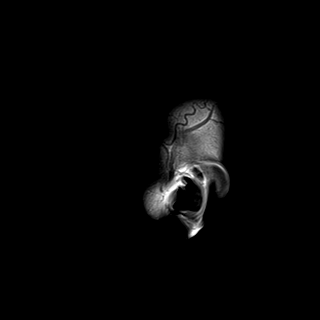

[Series 8: T2 · axial · 5.0mm · 0.62mm/px · 1 of 24 slices shown]
[im 1/24]
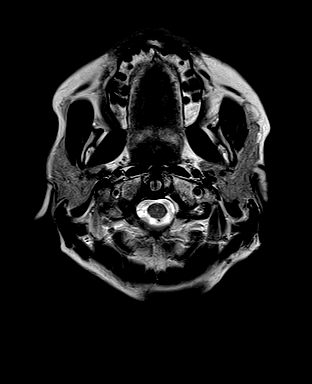

[Series 9: swi_images · axial · 3.0mm · 0.75mm/px · z∈[-57,+95]mm · 3 of 52 slices shown]
[im 1/52]
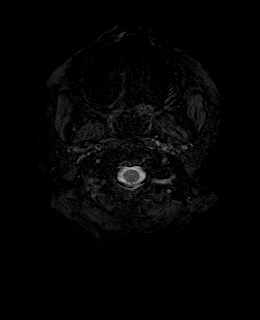
[im 26/52]
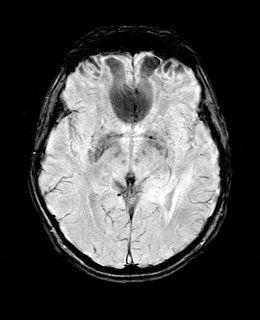
[im 52/52]
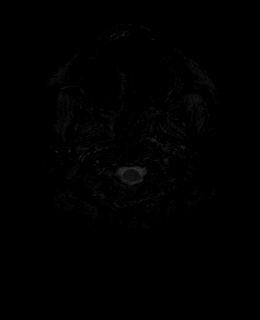

[Series 11: FLAIR · axial · 3.0mm · 0.75mm/px · z∈[-57,+95]mm · 3 of 52 slices shown]
[im 1/52]
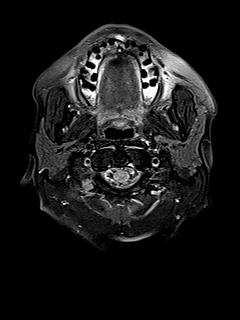
[im 26/52]
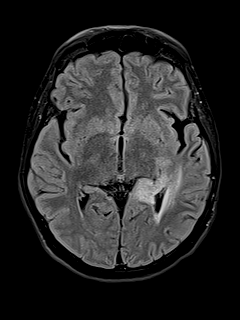
[im 52/52]
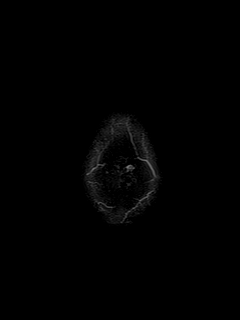

[Series 12: T1 · axial · 1.0mm · 0.94mm/px · z∈[-59,+98]mm · 10 of 160 slices shown (2 of 2)]
[im 1/160]
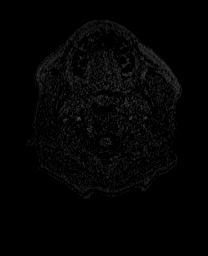
[im 18/160]
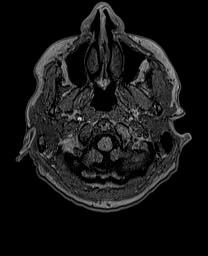
[im 36/160]
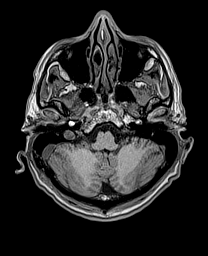
[im 54/160]
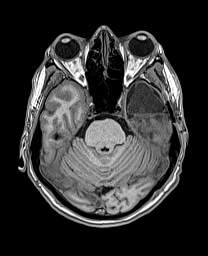
[im 71/160]
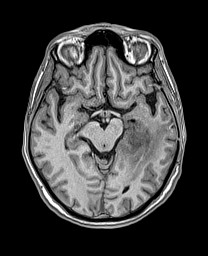
[im 89/160]
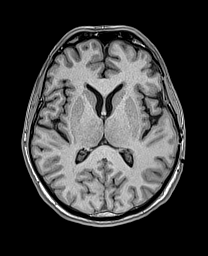
[im 107/160]
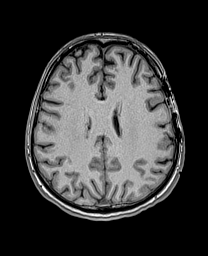
[im 124/160]
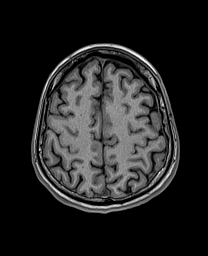
[im 142/160]
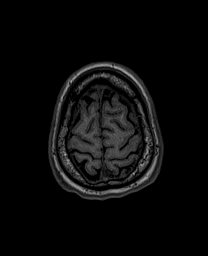
[im 160/160]
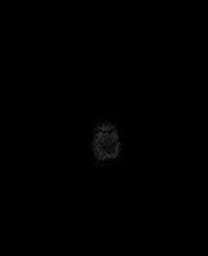

[Series 13: cor dwi_tracew · coronal · 5.0mm · 1.53mm/px · 3 of 56 slices shown]
[im 1/56]
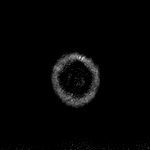
[im 28/56]
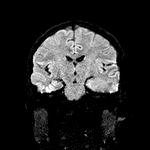
[im 56/56]
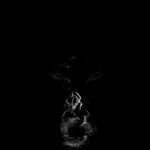

[Series 14: cor dwi_adc · coronal · 5.0mm · 1.53mm/px · 2 of 28 slices shown]
[im 1/28]
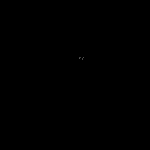
[im 28/28]
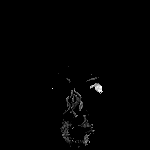

[Series 15: T2 post-contrast · coronal · 5.0mm · 0.57mm/px · 2 of 28 slices shown]
[im 1/28]
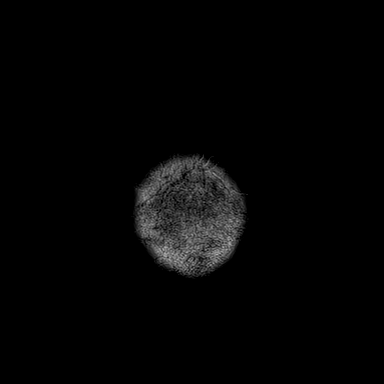
[im 28/28]
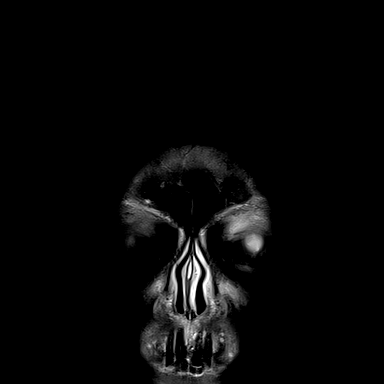

[Series 16: T1 post-contrast · axial · 1.0mm · 0.94mm/px · z∈[-59,+98]mm · 10 of 160 slices shown (1 of 3)]
[im 1/160]
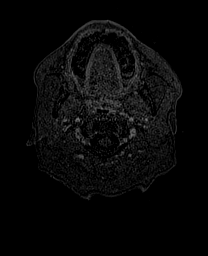
[im 18/160]
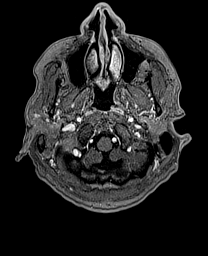
[im 36/160]
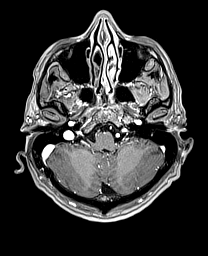
[im 54/160]
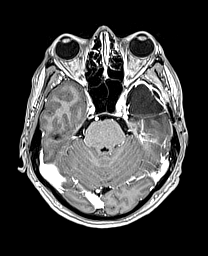
[im 71/160]
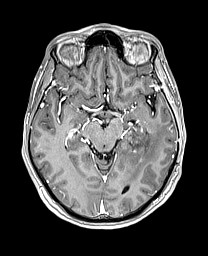
[im 89/160]
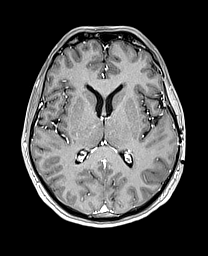
[im 107/160]
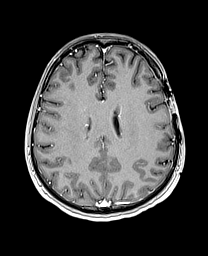
[im 124/160]
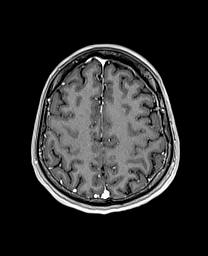
[im 142/160]
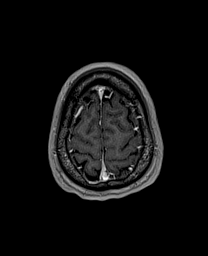
[im 160/160]
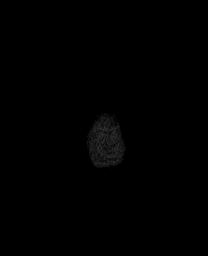

[Series 17: T1 post-contrast · coronal · 5.0mm · 0.43mm/px · 2 of 28 slices shown (2 of 3)]
[im 1/28]
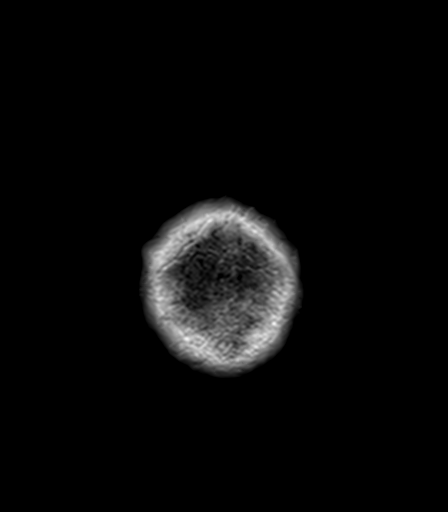
[im 28/28]
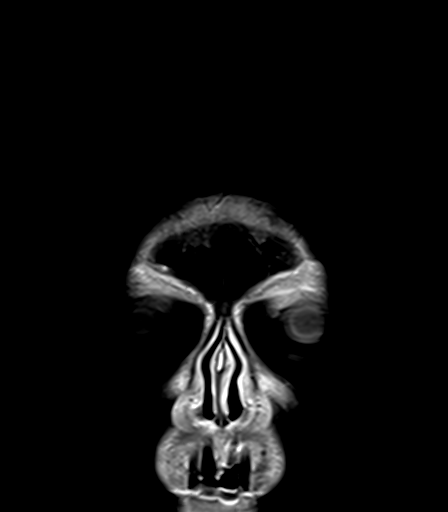

[Series 18: T1 post-contrast · sagittal · 5.0mm · 0.75mm/px · 2 of 26 slices shown (3 of 3)]
[im 1/26]
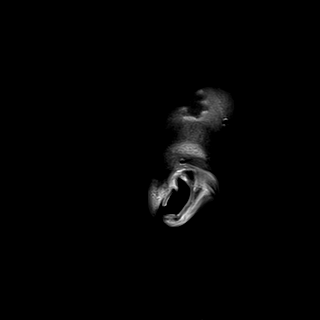
[im 26/26]
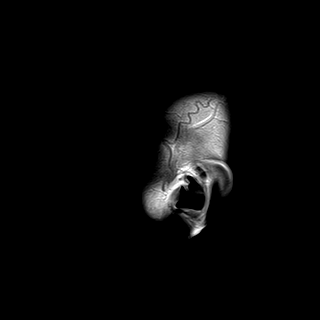

[48 of 48 positions shown; findings below may reference images not displayed]

FINDINGS: Brain: Left anterior temporal resection cavity is identified with a
new small fluid level possibly reflecting layering blood products.
Adjacent T2 FLAIR hyperintensity extending posteriorly about the
lateral ventricle and along the hippocampus as seen previously. The
extent is minimally increased, for example in the posterior temporal
region (series 11, image 27). Ill-defined enhancement at the deep
aspect of the surgical cavity and along the medial temporal lobe has
increased (for example see series 17, images 12-16 on present and
prior study).

There is no acute infarction or intracranial hemorrhage. No
hydrocephalus.

Vascular: Major vessel flow voids at the skull base are preserved.

Skull and upper cervical spine: Normal marrow signal is preserved.

Sinuses/Orbits: Paranasal sinuses are aerated. Orbits are
unremarkable.

Other: Sella is unremarkable.  Mastoid air cells are clear.
IMPRESSION: Further progression of ill-defined enhancement at the deep aspect of
the surgical cavity and along the medial temporal lobe. This is
within the treatment area and therefore pseudoprogression and true
progression both remain possible.

Minimal increase in extent of abnormal T2 FLAIR signal.

## 2021-04-12 MED ORDER — GADOBUTROL 1 MMOL/ML IV SOLN
6.0000 mL | Freq: Once | INTRAVENOUS | Status: AC | PRN
  Administered 2021-04-12: 6 mL via INTRAVENOUS

## 2021-04-13 ENCOUNTER — Other Ambulatory Visit: Payer: Self-pay

## 2021-04-13 ENCOUNTER — Other Ambulatory Visit (HOSPITAL_COMMUNITY): Payer: Self-pay

## 2021-04-17 ENCOUNTER — Inpatient Hospital Stay: Payer: Medicaid Other | Attending: Internal Medicine

## 2021-04-17 ENCOUNTER — Other Ambulatory Visit (HOSPITAL_COMMUNITY): Payer: Self-pay

## 2021-04-17 ENCOUNTER — Other Ambulatory Visit: Payer: Self-pay

## 2021-04-17 ENCOUNTER — Inpatient Hospital Stay (HOSPITAL_BASED_OUTPATIENT_CLINIC_OR_DEPARTMENT_OTHER): Payer: Medicaid Other | Admitting: Internal Medicine

## 2021-04-17 VITALS — BP 106/59 | HR 66 | Temp 98.7°F | Resp 16 | Ht 65.0 in | Wt 135.3 lb

## 2021-04-17 DIAGNOSIS — Z923 Personal history of irradiation: Secondary | ICD-10-CM | POA: Diagnosis not present

## 2021-04-17 DIAGNOSIS — C712 Malignant neoplasm of temporal lobe: Secondary | ICD-10-CM | POA: Diagnosis not present

## 2021-04-17 DIAGNOSIS — R569 Unspecified convulsions: Secondary | ICD-10-CM | POA: Diagnosis not present

## 2021-04-17 DIAGNOSIS — R400 Somnolence: Secondary | ICD-10-CM | POA: Insufficient documentation

## 2021-04-17 DIAGNOSIS — Z9221 Personal history of antineoplastic chemotherapy: Secondary | ICD-10-CM | POA: Insufficient documentation

## 2021-04-17 DIAGNOSIS — C719 Malignant neoplasm of brain, unspecified: Secondary | ICD-10-CM

## 2021-04-17 DIAGNOSIS — R42 Dizziness and giddiness: Secondary | ICD-10-CM | POA: Diagnosis not present

## 2021-04-17 DIAGNOSIS — Z7952 Long term (current) use of systemic steroids: Secondary | ICD-10-CM | POA: Insufficient documentation

## 2021-04-17 DIAGNOSIS — Z79899 Other long term (current) drug therapy: Secondary | ICD-10-CM | POA: Diagnosis not present

## 2021-04-17 LAB — CBC WITH DIFFERENTIAL (CANCER CENTER ONLY)
Abs Immature Granulocytes: 0.03 10*3/uL (ref 0.00–0.07)
Basophils Absolute: 0 10*3/uL (ref 0.0–0.1)
Basophils Relative: 0 %
Eosinophils Absolute: 0 10*3/uL (ref 0.0–0.5)
Eosinophils Relative: 0 %
HCT: 42 % (ref 39.0–52.0)
Hemoglobin: 14.3 g/dL (ref 13.0–17.0)
Immature Granulocytes: 1 %
Lymphocytes Relative: 6 %
Lymphs Abs: 0.4 10*3/uL — ABNORMAL LOW (ref 0.7–4.0)
MCH: 29.2 pg (ref 26.0–34.0)
MCHC: 34 g/dL (ref 30.0–36.0)
MCV: 85.7 fL (ref 80.0–100.0)
Monocytes Absolute: 0.1 10*3/uL (ref 0.1–1.0)
Monocytes Relative: 2 %
Neutro Abs: 5.8 10*3/uL (ref 1.7–7.7)
Neutrophils Relative %: 91 %
Platelet Count: 224 10*3/uL (ref 150–400)
RBC: 4.9 MIL/uL (ref 4.22–5.81)
RDW: 13.6 % (ref 11.5–15.5)
WBC Count: 6.3 10*3/uL (ref 4.0–10.5)
nRBC: 0 % (ref 0.0–0.2)

## 2021-04-17 LAB — CMP (CANCER CENTER ONLY)
ALT: 12 U/L (ref 0–44)
AST: 11 U/L — ABNORMAL LOW (ref 15–41)
Albumin: 4.2 g/dL (ref 3.5–5.0)
Alkaline Phosphatase: 54 U/L (ref 38–126)
Anion gap: 11 (ref 5–15)
BUN: 11 mg/dL (ref 6–20)
CO2: 26 mmol/L (ref 22–32)
Calcium: 9.6 mg/dL (ref 8.9–10.3)
Chloride: 103 mmol/L (ref 98–111)
Creatinine: 0.76 mg/dL (ref 0.61–1.24)
GFR, Estimated: >60 mL/min (ref >60)
Glucose, Bld: 102 mg/dL — ABNORMAL HIGH (ref 70–99)
Potassium: 4.3 mmol/L (ref 3.5–5.1)
Sodium: 140 mmol/L (ref 135–145)
Total Bilirubin: 0.7 mg/dL (ref 0.3–1.2)
Total Protein: 6.8 g/dL (ref 6.5–8.1)

## 2021-04-17 MED ORDER — ONDANSETRON HCL 8 MG PO TABS
8.0000 mg | ORAL_TABLET | Freq: Two times a day (BID) | ORAL | 1 refills | Status: DC | PRN
  Filled 2021-04-17: qty 30, 15d supply, fill #0

## 2021-04-17 MED ORDER — TEMOZOLOMIDE 140 MG PO CAPS
140.0000 mg | ORAL_CAPSULE | Freq: Every day | ORAL | 0 refills | Status: DC
  Filled 2021-04-17: qty 5, 28d supply, fill #0

## 2021-04-17 MED ORDER — TEMOZOLOMIDE 180 MG PO CAPS
180.0000 mg | ORAL_CAPSULE | Freq: Every day | ORAL | 0 refills | Status: DC
  Filled 2021-04-17: qty 5, 28d supply, fill #0

## 2021-04-17 MED ORDER — DEXAMETHASONE 4 MG PO TABS: 2.0000 mg | ORAL_TABLET | Freq: Every day | ORAL | 0 refills | Status: DC

## 2021-04-17 NOTE — Progress Notes (Signed)
[facility] [place], [place] [address] [phone]   Interval Evaluation  Date of Service: 04/17/21 [name] Patient MRN: [id number] Patient DOB: [birth year] Provider: Ventura Sellers, MD  Identifying Statement:  Hunter Terry is a 60 y.o. 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 [name]; path reveals glioblastoma IDH-wt   08/02/2020 Surgery   Debulking craniotomy with Dr. [name]   Glioblastoma [facility])  06/15/2020 Surgery   Stereotactic biopsy, L temporal [name]).  Path demonstrates glioblastoma IDH-1 wt   08/02/2020 Surgery   Debulking craniotomy with Dr. [name]   09/05/2020 - 10/17/2020 Radiation Therapy   IMRT with concurrent Temozolomide [name])   11/19/2020 -  Chemotherapy   Initiates cycle #1 adjuvant Temozolomide         Biomarkers:  MGMT Unknown.  IDH 1/2 Wild type.  EGFR Unknown  TERT "Mutated   Interval History:  Hunter Terry presents today for follow up after cycle #5 of adjuvant Temodar.  No issues tolerating the chemo, fatigue level is stable. Denies new or progressive deficits. No balance issues, motor dysfunction.  Has been dosing decadron $RemoveBeforeD'4mg'KCRRBkcFnUzuGq$  daily since 8/23.  Continues on Keppra. Brother is at bedside today.  H+P (07/24/20) Patient presented to medical attention in late October, 2021 with new onset seizure.  Event was decribed as loss of consciousness, sudden, without clarity on further details aside from altered mental status upon awakening.  CNS imaging demonsrated non-enhancing mass within left temporal lobe c/w likely glioma; he underwent stereotactic biopsy with Dr. [name] on 06/15/20.  There was significant delay in finalizing  path, explaining delay in follow up and evaluation.  He denies any seizures since discharge from hospital, on 4 anti-seizure drugs.  He does complain of dizziness and drowsiness with the medications, however.  Also describes impaired short term memory.  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    Medications: Current Outpatient Medications on File Prior to Visit  Medication Sig Dispense Refill   Carboxymethylcellul-Glycerin (LUBRICATING EYE DROPS OP) Place 1 drop into both eyes daily as needed (dry eyes).     dexamethasone (DECADRON) 4 MG tablet Take 1 tablet (4 mg total) by mouth daily. 30 tablet 0   levETIRAcetam (KEPPRA) 750 MG tablet Take 1 tablet (750 mg total) by mouth 2 (two) times daily. 60 tablet 0   ondansetron (ZOFRAN) 8 MG tablet Take 1 tablet (8 mg total) by mouth 2 (two) times daily as needed (nausea and vomiting). May take 30-60 minutes prior to Temodar administration if nausea/vomiting occurs. (Patient not taking: Reported on 03/22/2021) 30 tablet 1   oxymetazoline (AFRIN) 0.05 % nasal spray Place 1 spray into both nostrils 2 (two) times daily as needed for congestion. (Patient not taking: No sig reported)     temozolomide (TEMODAR) 140 MG capsule Take 1 capsule (140 mg total) by mouth daily. May take on an empty stomach to decrease nausea & vomiting. 5 capsule 0   temozolomide (TEMODAR) 180 MG capsule Take 1 capsule (180 mg total) by mouth daily. May take on an empty stomach to decrease nausea & vomiting. 5 capsule 0   [DISCONTINUED] lacosamide (VIMPAT) 200 MG TABS tablet Take 0.5 tablets (100 mg total) by mouth 2 (two) times daily. 30 tablet  0   No current facility-administered medications on file prior to visit.    Allergies: No Known Allergies Past Medical History:  Past Medical History:  Diagnosis Date   Cancer ([place])    BRAIN TUMOR   Headache    Seizure [facility])    Past Surgical History:  Past Surgical History:  Procedure Laterality Date   APPLICATION OF  CRANIAL NAVIGATION N/A 06/15/2020   Procedure: APPLICATION OF CRANIAL NAVIGATION;  Surgeon: [name], MD;  Location: [place];  Service: Neurosurgery;  Laterality: N/A;   APPLICATION OF CRANIAL NAVIGATION N/A 08/02/2020   Procedure: APPLICATION OF CRANIAL NAVIGATION;  Surgeon: [name], MD;  Location: [place];  Service: Neurosurgery;  Laterality: N/A;   CRANIOTOMY N/A 08/02/2020   Procedure: CRANIOTOMY LEFT TEMPORAL LOBECTOMY FOR TUMOR EXCISION;  Surgeon: [name], MD;  Location: [place];  Service: Neurosurgery;  Laterality: N/A;   FRAMELESS  BIOPSY WITH BRAINLAB Left 06/15/2020   Procedure: Left temporal lobe stereotactic brain biopsy with brainlab;  Surgeon: [name], MD;  Location: [place];  Service: Neurosurgery;  Laterality: Left;   Social History:  Social History   Socioeconomic History   Marital status: Married    Spouse name: Not on file   Number of children: Not on file   Years of education: Not on file   Highest education level: Not on file  Occupational History   Not on file  Tobacco Use   Smoking status: Never   Smokeless tobacco: Never  Substance and Sexual Activity   Alcohol use: Never   Drug use: Not on file   Sexual activity: Yes    Partners: Female  Other Topics Concern   Not on file  Social History Narrative   Not on file   Social Determinants of Health   Financial Resource Strain: Not on file  Food Insecurity: Not on file  Transportation Needs: Not on file  Physical Activity: Not on file  Stress: Not on file  Social Connections: Not on file  Intimate Partner Violence: Not on file   Family History:  Family History  Problem Relation Age of Onset   Cancer Neg Hx     Review of Systems: Constitutional: Doesn't report fevers, chills or abnormal weight loss Eyes: Doesn't report blurriness of vision Ears, nose, mouth, throat, and face: Doesn't report sore throat Respiratory: Doesn't report cough, dyspnea or  wheezes Cardiovascular: Doesn't report palpitation, chest discomfort  Gastrointestinal:  Doesn't report nausea, constipation, diarrhea GU: Doesn't report incontinence Skin: Doesn't report skin rashes Neurological: Per HPI Musculoskeletal: Doesn't report joint pain Behavioral/Psych: Doesn't report anxiety  Physical Exam: Vitals:   04/17/21 1204  BP: (!) 106/59  Pulse: 66  Resp: 16  Temp: 98.7 F (37.1 C)  SpO2: 98%    KPS: 90. General: Alert, cooperative, pleasant, in no acute distress Head: Normal EENT: No conjunctival injection or scleral icterus.  Lungs: Resp effort normal Cardiac: Regular rate Abdomen: Non-distended abdomen Skin: No rashes cyanosis or petechiae. Extremities: No clubbing or edema  Neurologic Exam: Mental Status: Awake, alert, attentive to examiner. Oriented to self and environment. Language is fluent with intact comprehension.  Cranial Nerves: Visual acuity is grossly normal. Visual fields are full. Extra-ocular movements intact. No ptosis. Face is symmetric Motor: Tone and bulk are normal. Power is full in both arms and legs. Reflexes are symmetric, no pathologic reflexes present.  Sensory: Intact to light touch Gait: Normal.   Labs: I have reviewed the data as listed  Component Value Date/Time   NA 138 03/22/2021 1100   K 4.3 03/22/2021 1100   CL 100 03/22/2021 1100   CO2 30 03/22/2021 1100   GLUCOSE 95 03/22/2021 1100   BUN 6 03/22/2021 1100   CREATININE 0.78 03/22/2021 1100   CALCIUM 9.6 03/22/2021 1100   PROT 7.1 03/22/2021 1100   ALBUMIN 4.4 03/22/2021 1100   AST 14 (L) 03/22/2021 1100   ALT 10 03/22/2021 1100   ALKPHOS 65 03/22/2021 1100   BILITOT 0.9 03/22/2021 1100   GFRNONAA >60 03/22/2021 1100   Lab Results  Component Value Date   WBC 6.3 04/17/2021   NEUTROABS 5.8 04/17/2021   HGB 14.3 04/17/2021   HCT 42.0 04/17/2021   MCV 85.7 04/17/2021   PLT 224 04/17/2021   Imaging:  [place] Clinician Interpretation: I have  personally reviewed the CNS images as listed.  My interpretation, in the context of the patient's clinical presentation, is treatment effect vs true progression  MR BRAIN W WO CONTRAST  Result Date: 04/13/2021 CLINICAL DATA:  Glioblastoma, follow-up EXAM: MRI HEAD WITHOUT AND WITH CONTRAST TECHNIQUE: Multiplanar, multiecho pulse sequences of the brain and surrounding structures were obtained without and with intravenous contrast. CONTRAST:  83mL GADAVIST GADOBUTROL 1 MMOL/ML IV SOLN COMPARISON:  6 mL Gadavist FINDINGS: Brain: Left anterior temporal resection cavity is identified with a new small fluid level possibly reflecting layering blood products. Adjacent T2 FLAIR hyperintensity extending posteriorly about the lateral ventricle and along the hippocampus as seen previously. The extent is minimally increased, for example in the posterior temporal region (series 11, image 27). Ill-defined enhancement at the deep aspect of the surgical cavity and along the medial temporal lobe has increased (for example see series 17, images 12-16 on present and prior study). There is no acute infarction or intracranial hemorrhage. No hydrocephalus. Vascular: Major vessel flow voids at the skull base are preserved. Skull and upper cervical spine: Normal marrow signal is preserved. Sinuses/Orbits: Paranasal sinuses are aerated. Orbits are unremarkable. Other: Sella is unremarkable.  Mastoid air cells are clear. IMPRESSION: Further progression of ill-defined enhancement at the deep aspect of the surgical cavity and along the medial temporal lobe. This is within the treatment area and therefore pseudoprogression and true progression both remain possible. Minimal increase in extent of abnormal T2 FLAIR signal. Electronically Signed   By: Macy Mis M.D.   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  MRI demonstrates progression of disease, though with relative stability of two previously novel foci of disease.  There is pattern of enhancement without mass affect affecting much of the mesial temporal lobe; this could be reflective of pseudo-progression. Alternately, they process could of course represent organic tumor progression.  We recommended continuing to follow these changes closely, repeating an MRI in 1 month with addition of perfusion protocol sequences for better clarification.    In the meantime, we recommended continuing treatment with Temozolomide, cycle #6 at $Rem'200mg'Kdfn$ /m2, on for five days and off for twenty three days in twenty eight day cycles. The patient will have a complete blood count performed on days 21 and 28 of each cycle, and a comprehensive metabolic panel performed on day 28 of each cycle. Labs may need to be performed more often. Zofran will prescribed for home use for nausea/vomiting.   Chemotherapy should be held for the following:  ANC less  than 1,000  Platelets less than 100,000  LFT or creatinine greater than 2x ULN  If clinical concerns/contraindications develop  Decadron should be cut down to $Remov'2mg'iDMXZw$  daily.   Keppra will stay at $Remov'750mg'XXgUmK$  BID.  We ask that Eldrick Penick return to clinic in 1 months following perfusion brain MRI.  We appreciate the opportunity to participate in the care of [place].    All questions were answered. The patient knows to call the clinic with any problems, questions or concerns. No barriers to learning were detected.  The total time spent in the encounter was 40 minutes and more than 50% was on counseling and review of test results   Ventura Sellers, MD Medical Director of Neuro-Oncology [facility] [place] 04/17/21 11:51 AM

## 2021-04-26 ENCOUNTER — Other Ambulatory Visit: Payer: Self-pay | Admitting: Radiation Therapy

## 2021-05-01 ENCOUNTER — Telehealth: Payer: Self-pay | Admitting: Internal Medicine

## 2021-05-01 NOTE — Telephone Encounter (Signed)
Sch per 9/7 los, called with interpreter, pt wife aware

## 2021-05-02 ENCOUNTER — Other Ambulatory Visit: Payer: Medicaid Other

## 2021-05-08 ENCOUNTER — Other Ambulatory Visit: Payer: Self-pay | Admitting: Internal Medicine

## 2021-05-08 ENCOUNTER — Other Ambulatory Visit (HOSPITAL_COMMUNITY): Payer: Self-pay

## 2021-05-08 DIAGNOSIS — C719 Malignant neoplasm of brain, unspecified: Secondary | ICD-10-CM

## 2021-05-08 NOTE — Telephone Encounter (Signed)
Patient is aware that must have labs and md visit prior to his chemo being renewed.

## 2021-05-14 ENCOUNTER — Other Ambulatory Visit (HOSPITAL_COMMUNITY): Payer: Self-pay

## 2021-05-18 ENCOUNTER — Ambulatory Visit
Admission: RE | Admit: 2021-05-18 | Discharge: 2021-05-18 | Disposition: A | Discharge status: Home / Self Care | Payer: Medicaid Other | Source: Ambulatory Visit | Attending: Internal Medicine | Admitting: Internal Medicine

## 2021-05-18 DIAGNOSIS — C719 Malignant neoplasm of brain, unspecified: Secondary | ICD-10-CM

## 2021-05-18 IMAGING — MR MR HEAD WO/W CM
36 series · 48 of 48 positions shown · IV contrast (MULTIHANCE)
Comparison: [DATE]

CLINICAL DATA: Follow-up glioblastoma.

EXAM:
MRI HEAD WITHOUT AND WITH CONTRAST
TECHNIQUE: Multiplanar, multiecho pulse sequences of the brain and surrounding
structures were obtained without and with intravenous contrast.
CONTRAST:  13mL MULTIHANCE GADOBENATE DIMEGLUMINE 529 MG/ML IV SOLN

[Series 5: T1 · sagittal · 4.0mm · 0.75mm/px · 1 of 32 slices shown (1 of 3)]
[im 1/32]
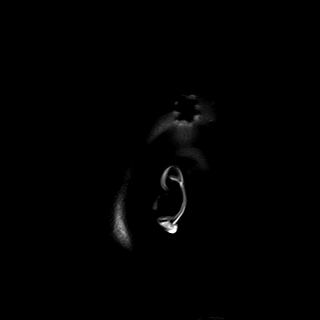

[Series 6: DWI · axial · 3.0mm · 0.94mm/px · 1 of 172 slices shown (1 of 3)]
[im 1/172]
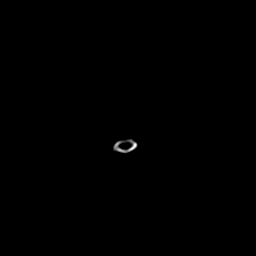

[Series 7: ax dwi_tracew · axial · 3.0mm · 0.94mm/px · 1 of 86 slices shown]
[im 1/86]
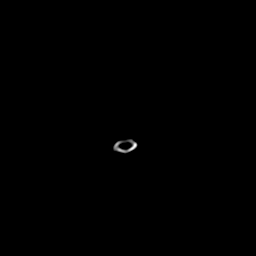

[Series 8: ax dwi_adc · axial · 3.0mm · 0.94mm/px · 1 of 41 slices shown]
[im 1/41]
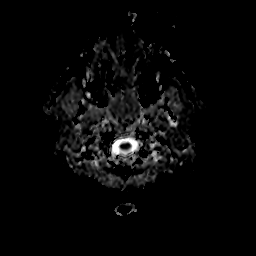

[Series 9: DWI · coronal · 5.0mm · 1.44mm/px · 1 of 66 slices shown (2 of 3)]
[im 1/66]
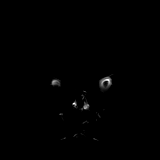

[Series 10: DWI · coronal · 5.0mm · 1.44mm/px · 1 of 33 slices shown (3 of 3)]
[im 1/33]
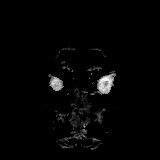

[Series 11: T2 · axial · 4.0mm · 0.36mm/px · 1 of 30 slices shown]
[im 1/30]
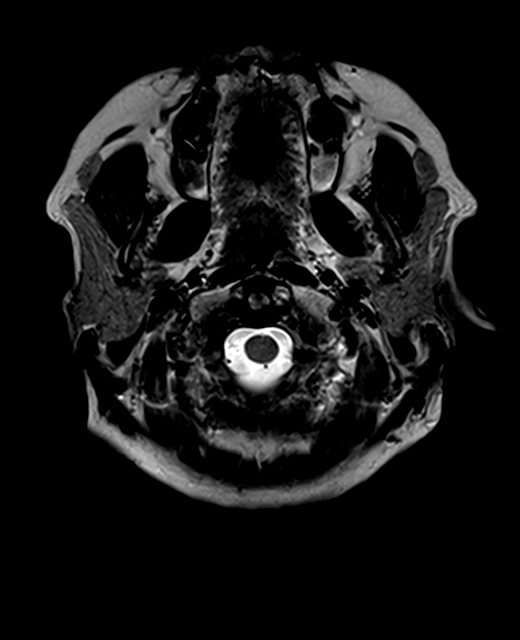

[Series 12: FLAIR · axial · 3.0mm · 0.72mm/px · 1 of 26 slices shown]
[im 1/26]
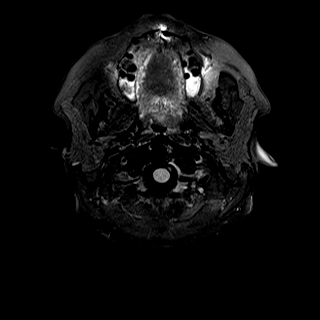

[Series 13: swi_images · axial · 1.5mm · 0.90mm/px · 1 of 96 slices shown]
[im 1/96]
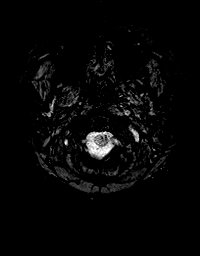

[Series 14: mip_images(sw) · axial · 12.0mm · 0.90mm/px · 1 of 89 slices shown]
[im 1/89]
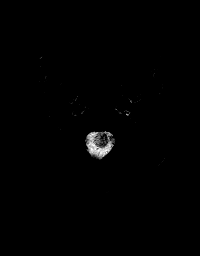

[Series 15: T1 · axial · 1.0mm · 0.94mm/px · 1 of 160 slices shown (2 of 3)]
[im 1/160]
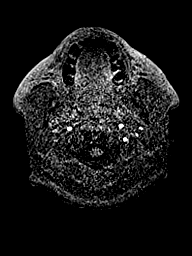

[Series 16: perfusion axial · axial · 5.0mm · 1.72mm/px · z∈[-74,+79]mm · 4 of 1620 slices shown]
[im 1/1620]
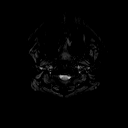
[im 540/1620]
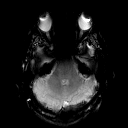
[im 1080/1620]
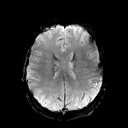
[im 1620/1620]
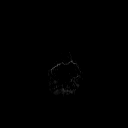

[Series 17: perfusion axial_moco · axial · 5.0mm · 1.72mm/px · z∈[-74,+79]mm · 4 of 1620 slices shown]
[im 1/1620]
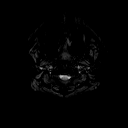
[im 540/1620]
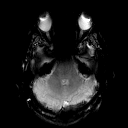
[im 1080/1620]
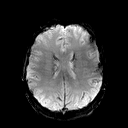
[im 1620/1620]
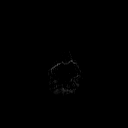

[Series 18: ttp_perfusion axial_moco · axial · 5.0mm · 1.72mm/px · 1 of 27 slices shown]
[im 1/27]
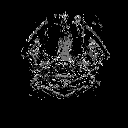

[Series 19: pbp_perfusion axial_moco · axial · 5.0mm · 1.72mm/px · 1 of 27 slices shown]
[im 1/27]
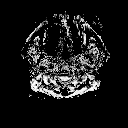

[Series 21: relcbv_local_perfusion axial_moco · axial · 5.0mm · 1.72mm/px · 1 of 26 slices shown]
[im 1/26]
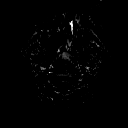

[Series 22: relcbf_local_perfusion axial_moco · axial · 5.0mm · 1.72mm/px · 1 of 26 slices shown]
[im 1/26]
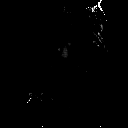

[Series 24: relccbv_local_perfusion axial_moco · axial · 5.0mm · 1.72mm/px · 1 of 26 slices shown]
[im 1/26]
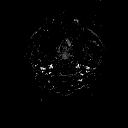

[Series 25: T2 post-contrast · coronal · 4.0mm · 0.36mm/px · 1 of 35 slices shown]
[im 1/35]
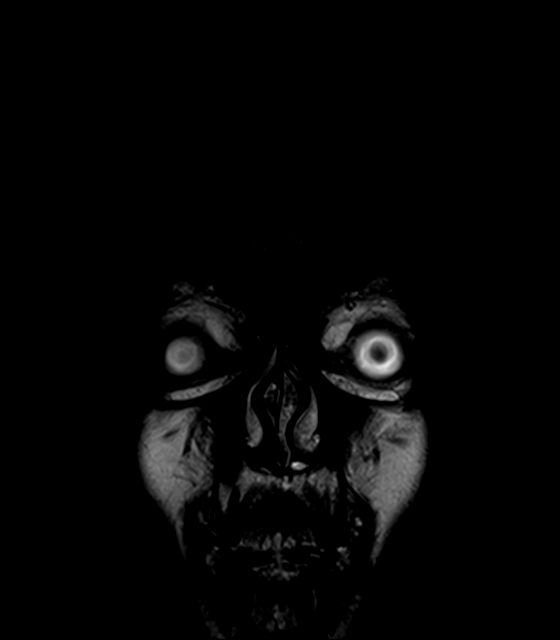

[Series 26: T1 · axial · 1.0mm · 0.94mm/px · 1 of 160 slices shown (3 of 3)]
[im 1/160]
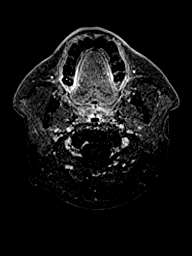

[Series 27: T1 post-contrast · coronal · 4.0mm · 0.72mm/px · 1 of 35 slices shown (1 of 2)]
[im 1/35]
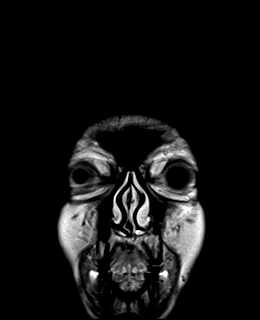

[Series 28: T1 post-contrast · sagittal · 4.0mm · 0.75mm/px · 1 of 32 slices shown (2 of 2)]
[im 1/32]
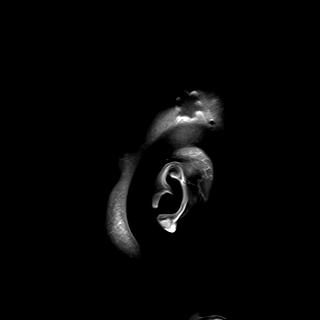

[Series 100: cbv · axial · 5.0mm · 1.72mm/px · z∈[-74,+79]mm · 4 of 1620 slices shown]
[im 1/1620]
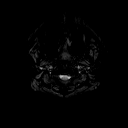
[im 540/1620]
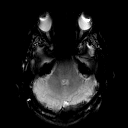
[im 1080/1620]
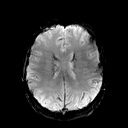
[im 1620/1620]
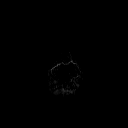

[Series 101: cbv_rgb · 1 of 974 slices shown]
[im 1/974]
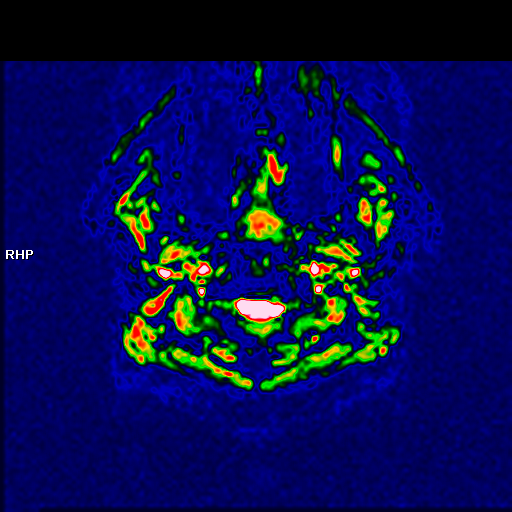

[Series 102: cbv new · axial · 5.0mm · 1.72mm/px · z∈[-74,+79]mm · 4 of 1620 slices shown]
[im 1/1620]
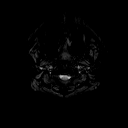
[im 540/1620]
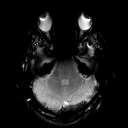
[im 1080/1620]
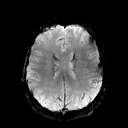
[im 1620/1620]
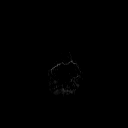

[Series 104: cbv new_rgb · 1 of 333 slices shown]
[im 1/333]
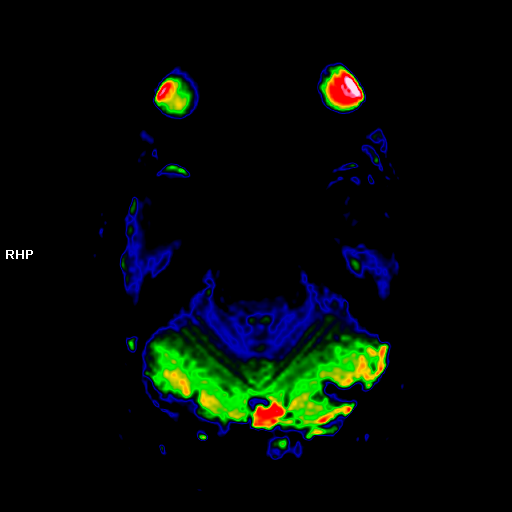

[Series 105: cbv moco · axial · 5.0mm · 3.44mm/px · 1 of 27 slices shown]
[im 1/27]
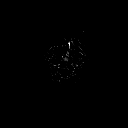

[Series 107: cbf · axial · 5.0mm · 1.72mm/px · 1 of 26 slices shown]
[im 1/26]
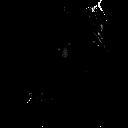

[Series 108: cbf_rgb · 1 of 22 slices shown]
[im 1/22]
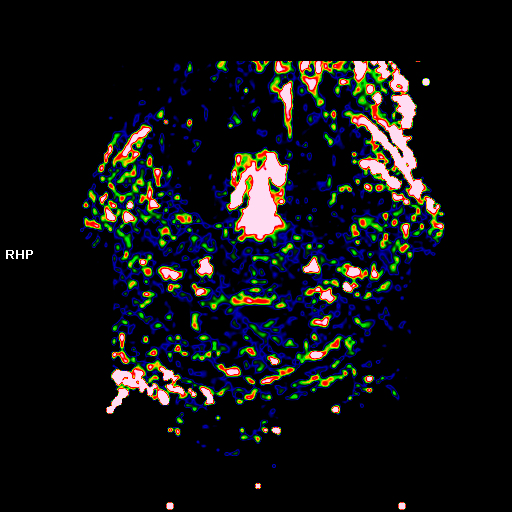

[Series 110: ccbv · axial · 5.0mm · 1.72mm/px · 1 of 26 slices shown]
[im 1/26]
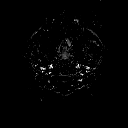

[Series 111: cbv test · axial · 5.0mm · 1.72mm/px · 1 of 26 slices shown]
[im 1/26]
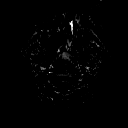

[Series 112: cbv test_rgb · 1 of 26 slices shown]
[im 1/26]
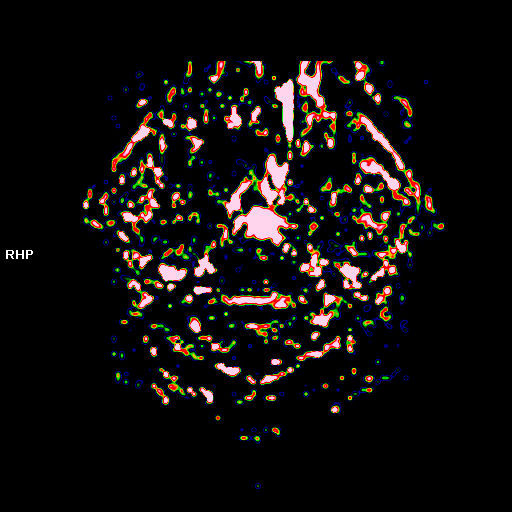

[Series 113: cbf test · axial · 5.0mm · 1.72mm/px · 1 of 26 slices shown]
[im 1/26]
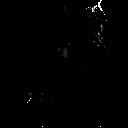

[Series 114: cbf test_rgb · 1 of 17 slices shown]
[im 1/17]
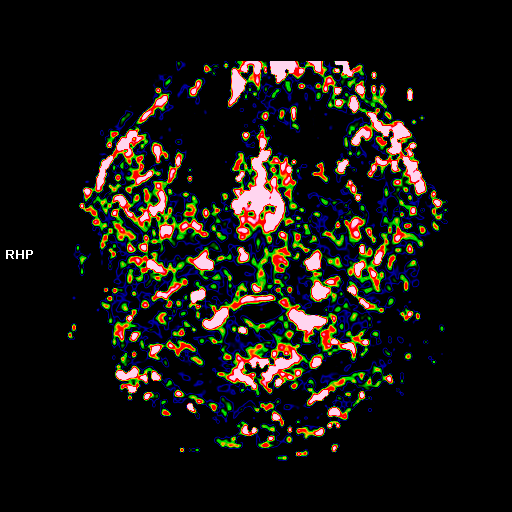

[Series 117: ccbv test · axial · 5.0mm · 1.72mm/px · 1 of 26 slices shown]
[im 1/26]
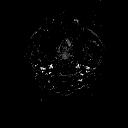

[Series 118: ccbv test_rgb · 1 of 23 slices shown]
[im 1/23]
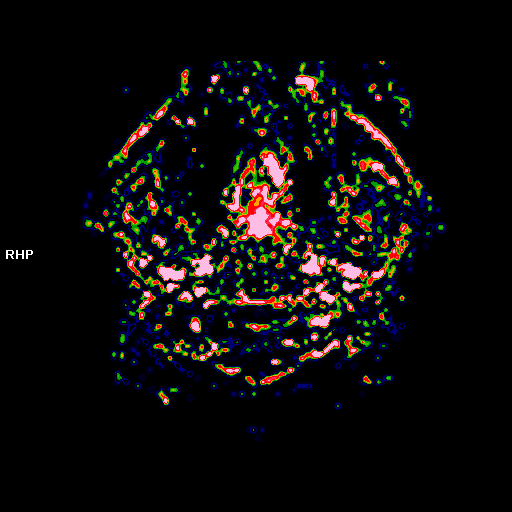

[48 of 48 positions shown; findings below may reference images not displayed]

FINDINGS: Brain: A resection cavity is again noted in the anterior left
temporal lobe with a fluid-fluid level and smooth regional dural
enhancement. Heterogeneously enhancing masslike T2 signal
abnormality extending posteriorly from the resection cavity along
the hippocampus and lateral ventricle has mildly progressed, most
notably along its lateral aspect (for example series 26, image 58
and series 28, image 26). However, this enhancing tissue
demonstrates low CBV on perfusion imaging. Mild nonenhancing T2
hyperintensity in the left temporal white matter is unchanged.

There is no acute infarct, midline shift, or extra-axial fluid
collection. The ventricles are unchanged in size. Small T2
hyperintensities elsewhere in the cerebral white matter bilaterally
and in the pons have mildly progressed and are nonspecific but may
reflect a combination of post treatment changes and mild chronic
small vessel ischemia.

Vascular: Major intracranial vascular flow voids are preserved.

Skull and upper cervical spine: Left temporal craniotomy.

Sinuses/Orbits: Unremarkable orbits. Paranasal sinuses and mastoid
air cells are clear.

Other: None.
IMPRESSION: Mildly progressive enhancement in the left temporal lobe with low
CBV favoring radiation necrosis.

## 2021-05-18 MED ORDER — GADOBENATE DIMEGLUMINE 529 MG/ML IV SOLN
13.0000 mL | Freq: Once | INTRAVENOUS | Status: AC | PRN
  Administered 2021-05-18: 13 mL via INTRAVENOUS

## 2021-05-21 ENCOUNTER — Other Ambulatory Visit (HOSPITAL_COMMUNITY): Payer: Self-pay

## 2021-05-21 ENCOUNTER — Encounter: Payer: Self-pay | Admitting: Internal Medicine

## 2021-05-21 ENCOUNTER — Inpatient Hospital Stay (HOSPITAL_BASED_OUTPATIENT_CLINIC_OR_DEPARTMENT_OTHER): Payer: Medicaid Other | Admitting: Internal Medicine

## 2021-05-21 ENCOUNTER — Other Ambulatory Visit: Payer: Self-pay

## 2021-05-21 ENCOUNTER — Inpatient Hospital Stay: Payer: Medicaid Other | Attending: Internal Medicine

## 2021-05-21 VITALS — BP 112/54 | HR 64 | Temp 98.2°F | Resp 19 | Ht 65.0 in | Wt 145.1 lb

## 2021-05-21 DIAGNOSIS — R569 Unspecified convulsions: Secondary | ICD-10-CM | POA: Diagnosis not present

## 2021-05-21 DIAGNOSIS — C719 Malignant neoplasm of brain, unspecified: Secondary | ICD-10-CM

## 2021-05-21 DIAGNOSIS — C712 Malignant neoplasm of temporal lobe: Secondary | ICD-10-CM | POA: Diagnosis present

## 2021-05-21 DIAGNOSIS — Z79899 Other long term (current) drug therapy: Secondary | ICD-10-CM | POA: Insufficient documentation

## 2021-05-21 DIAGNOSIS — Z923 Personal history of irradiation: Secondary | ICD-10-CM | POA: Diagnosis not present

## 2021-05-21 DIAGNOSIS — Z9221 Personal history of antineoplastic chemotherapy: Secondary | ICD-10-CM | POA: Insufficient documentation

## 2021-05-21 LAB — CBC WITH DIFFERENTIAL (CANCER CENTER ONLY)
Abs Immature Granulocytes: 0.04 10*3/uL (ref 0.00–0.07)
Basophils Absolute: 0 10*3/uL (ref 0.0–0.1)
Basophils Relative: 0 %
Eosinophils Absolute: 0 10*3/uL (ref 0.0–0.5)
Eosinophils Relative: 1 %
HCT: 44.3 % (ref 39.0–52.0)
Hemoglobin: 14.9 g/dL (ref 13.0–17.0)
Immature Granulocytes: 1 %
Lymphocytes Relative: 15 %
Lymphs Abs: 0.7 10*3/uL (ref 0.7–4.0)
MCH: 29.8 pg (ref 26.0–34.0)
MCHC: 33.6 g/dL (ref 30.0–36.0)
MCV: 88.6 fL (ref 80.0–100.0)
Monocytes Absolute: 0.5 10*3/uL (ref 0.1–1.0)
Monocytes Relative: 11 %
Neutro Abs: 3.5 10*3/uL (ref 1.7–7.7)
Neutrophils Relative %: 72 %
Platelet Count: 215 10*3/uL (ref 150–400)
RBC: 5 MIL/uL (ref 4.22–5.81)
RDW: 14.6 % (ref 11.5–15.5)
WBC Count: 4.8 10*3/uL (ref 4.0–10.5)
nRBC: 0 % (ref 0.0–0.2)

## 2021-05-21 LAB — CMP (CANCER CENTER ONLY)
ALT: 14 U/L (ref 0–44)
AST: 14 U/L — ABNORMAL LOW (ref 15–41)
Albumin: 3.8 g/dL (ref 3.5–5.0)
Alkaline Phosphatase: 61 U/L (ref 38–126)
Anion gap: 8 (ref 5–15)
BUN: 12 mg/dL (ref 6–20)
CO2: 32 mmol/L (ref 22–32)
Calcium: 9.5 mg/dL (ref 8.9–10.3)
Chloride: 101 mmol/L (ref 98–111)
Creatinine: 0.83 mg/dL (ref 0.61–1.24)
GFR, Estimated: >60 mL/min (ref >60)
Glucose, Bld: 62 mg/dL — ABNORMAL LOW (ref 70–99)
Potassium: 4.6 mmol/L (ref 3.5–5.1)
Sodium: 141 mmol/L (ref 135–145)
Total Bilirubin: 0.4 mg/dL (ref 0.3–1.2)
Total Protein: 6.5 g/dL (ref 6.5–8.1)

## 2021-05-21 MED ORDER — TEMOZOLOMIDE 140 MG PO CAPS
140.0000 mg | ORAL_CAPSULE | Freq: Every day | ORAL | 0 refills | Status: DC
  Filled 2021-05-21: qty 5, 28d supply, fill #0

## 2021-05-21 MED ORDER — DEXAMETHASONE 1 MG PO TABS: ORAL_TABLET | ORAL | 1 refills | Status: DC

## 2021-05-21 MED ORDER — TEMOZOLOMIDE 180 MG PO CAPS
180.0000 mg | ORAL_CAPSULE | Freq: Every day | ORAL | 0 refills | Status: DC
Start: 2021-05-21 — End: 2021-07-17
  Filled 2021-05-21: qty 5, 28d supply, fill #0

## 2021-05-21 NOTE — Addendum Note (Signed)
Addended by: Ventura Sellers on: 05/21/2021 12:57 PM   Modules accepted: Orders

## 2021-05-21 NOTE — Progress Notes (Signed)
[facility] at [place], [place] [address] [phone]   Interval Evaluation  Date of Service: 05/21/21 [name] Patient MRN: [id number] Patient DOB: [birth year] Provider: Ventura Sellers, MD  Identifying Statement:  Hunter Terry is a 60 y.o. male with left temporal glioblastoma   Oncologic History: Oncology History  Glioblastoma, IDH-wildtype ([place])  06/15/2020 Surgery   Stereotactic biopsy, L temporal [name]).  Path demonstrates glioblastoma IDH-1 wt   08/02/2020 Surgery   Debulking craniotomy with Dr. [name]   09/05/2020 - 10/17/2020 Radiation Therapy   IMRT with concurrent Temozolomide [name])   11/19/2020 -  Chemotherapy   Initiates cycle #1 adjuvant Temozolomide         Biomarkers:  MGMT Unknown.  IDH 1/2 Wild type.  EGFR Unknown  TERT "Mutated   Interval History:  Hunter Terry presents today for follow up after cycle #6 of adjuvant Temodar ,with MRI brain for evaluation.  No new or progressive deficits, no issues tolerating the chemo, fatigue level is stable. No balance issues, motor dysfunction.  Has been dosing decadron $RemoveBeforeD'4mg'cqqBARlHFwpWIl$  daily since 8/23.  Continues on Keppra. Brother is at bedside today.  H+P (07/24/20) Patient presented to medical attention in late October, 2021 with new onset seizure.  Event was decribed as loss of consciousness, sudden, without clarity on further details aside from altered mental status upon awakening.  CNS imaging demonsrated non-enhancing mass within left temporal lobe c/w likely glioma; he underwent stereotactic biopsy with Dr. [name] on 06/15/20.  There was significant delay in finalizing path, explaining delay in follow up and evaluation.  He denies any seizures since discharge from hospital, on 4 anti-seizure drugs.  He does complain of dizziness and drowsiness with the medications, however.  Also describes impaired short term memory.  Had worked as a Administrator.  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    Medications: Current Outpatient Medications on File Prior to Visit  Medication Sig Dispense Refill   Carboxymethylcellul-Glycerin (LUBRICATING EYE DROPS OP) Place 1 drop into both eyes daily as needed (dry eyes).     dexamethasone (DECADRON) 4 MG tablet Take 0.5 tablets (2 mg total) by mouth daily. 30 tablet 0   levETIRAcetam (KEPPRA) 750 MG tablet Take 1 tablet (750 mg total) by mouth 2 (two) times daily. 60 tablet 0   ondansetron (ZOFRAN) 8 MG tablet Take 1 tablet (8 mg total) by mouth 2 (two) times daily as needed (nausea and vomiting). May take 30-60 minutes prior to Temodar administration if nausea/vomiting occurs. (Patient not taking: Reported on 03/22/2021) 30 tablet 1   ondansetron (ZOFRAN) 8 MG tablet Take 1 tablet (8 mg total) by mouth 2 (two) times daily as needed (nausea and vomiting). May take 30-60 minutes prior to Temodar administration if nausea/vomiting occurs. 30 tablet 1   oxymetazoline (AFRIN) 0.05 % nasal spray Place 1 spray into both nostrils 2 (two) times daily as needed for congestion. (Patient not taking: No sig reported)     temozolomide (TEMODAR) 140 MG capsule Take 1 capsule (140 mg total) by mouth daily. May take on an empty stomach to decrease nausea & vomiting. 5 capsule 0   temozolomide (TEMODAR) 180 MG capsule Take 1 capsule (180 mg total) by mouth daily. May take on an empty stomach to decrease nausea & vomiting. 5 capsule 0   [DISCONTINUED] lacosamide (VIMPAT) 200 MG TABS tablet Take 0.5 tablets (100 mg total) by mouth 2 (two) times daily. 30 tablet 0   No current  facility-administered medications on file prior to visit.    Allergies: No Known Allergies Past Medical History:  Past Medical History:  Diagnosis Date   Cancer ([place])    BRAIN TUMOR   Headache    Seizure [facility])    Past Surgical History:  Past Surgical History:  Procedure Laterality Date   APPLICATION OF CRANIAL NAVIGATION N/A 06/15/2020   Procedure: APPLICATION OF  CRANIAL NAVIGATION;  Surgeon: [name], MD;  Location: [place];  Service: Neurosurgery;  Laterality: N/A;   APPLICATION OF CRANIAL NAVIGATION N/A 08/02/2020   Procedure: APPLICATION OF CRANIAL NAVIGATION;  Surgeon: [name], MD;  Location: [place];  Service: Neurosurgery;  Laterality: N/A;   CRANIOTOMY N/A 08/02/2020   Procedure: CRANIOTOMY LEFT TEMPORAL LOBECTOMY FOR TUMOR EXCISION;  Surgeon: [name], MD;  Location: [place];  Service: Neurosurgery;  Laterality: N/A;   FRAMELESS  BIOPSY WITH BRAINLAB Left 06/15/2020   Procedure: Left temporal lobe stereotactic brain biopsy with brainlab;  Surgeon: [name], MD;  Location: [place];  Service: Neurosurgery;  Laterality: Left;   Social History:  Social History   Socioeconomic History   Marital status: Married    Spouse name: Not on file   Number of children: Not on file   Years of education: Not on file   Highest education level: Not on file  Occupational History   Not on file  Tobacco Use   Smoking status: Never   Smokeless tobacco: Never  Substance and Sexual Activity   Alcohol use: Never   Drug use: Not on file   Sexual activity: Yes    Partners: Female  Other Topics Concern   Not on file  Social History Narrative   Not on file   Social Determinants of Health   Financial Resource Strain: Not on file  Food Insecurity: Not on file  Transportation Needs: Not on file  Physical Activity: Not on file  Stress: Not on file  Social Connections: Not on file  Intimate Partner Violence: Not on file   Family History:  Family History  Problem Relation Age of Onset   Cancer Neg Hx     Review of Systems: Constitutional: Doesn't report fevers, chills or abnormal weight loss Eyes: Doesn't report blurriness of vision Ears, nose, mouth, throat, and face: Doesn't report sore throat Respiratory: Doesn't report cough, dyspnea or wheezes Cardiovascular: Doesn't report palpitation, chest discomfort   Gastrointestinal:  Doesn't report nausea, constipation, diarrhea GU: Doesn't report incontinence Skin: Doesn't report skin rashes Neurological: Per HPI Musculoskeletal: Doesn't report joint pain Behavioral/Psych: Doesn't report anxiety  Physical Exam: There were no vitals filed for this visit.   KPS: 90. General: Alert, cooperative, pleasant, in no acute distress Head: Normal EENT: No conjunctival injection or scleral icterus.  Lungs: Resp effort normal Cardiac: Regular rate Abdomen: Non-distended abdomen Skin: No rashes cyanosis or petechiae. Extremities: No clubbing or edema  Neurologic Exam: Mental Status: Awake, alert, attentive to examiner. Oriented to self and environment. Language is fluent with intact comprehension.  Cranial Nerves: Visual acuity is grossly normal. Visual fields are full. Extra-ocular movements intact. No ptosis. Face is symmetric Motor: Tone and bulk are normal. Power is full in both arms and legs. Reflexes are symmetric, no pathologic reflexes present.  Sensory: Intact to light touch Gait: Normal.   Labs: I have reviewed the data as listed    Component Value Date/Time   NA 140 04/17/2021 1141   K 4.3 04/17/2021 1141   CL 103 04/17/2021 1141  CO2 26 04/17/2021 1141   GLUCOSE 102 (H) 04/17/2021 1141   BUN 11 04/17/2021 1141   CREATININE 0.76 04/17/2021 1141   CALCIUM 9.6 04/17/2021 1141   PROT 6.8 04/17/2021 1141   ALBUMIN 4.2 04/17/2021 1141   AST 11 (L) 04/17/2021 1141   ALT 12 04/17/2021 1141   ALKPHOS 54 04/17/2021 1141   BILITOT 0.7 04/17/2021 1141   GFRNONAA >60 04/17/2021 1141   Lab Results  Component Value Date   WBC 6.3 04/17/2021   NEUTROABS 5.8 04/17/2021   HGB 14.3 04/17/2021   HCT 42.0 04/17/2021   MCV 85.7 04/17/2021   PLT 224 04/17/2021   Imaging:  [place] Clinician Interpretation: I have personally reviewed the CNS images as listed.  My interpretation, in the context of the patient's clinical presentation, is  treatment effect vs true progression  MR BRAIN W WO CONTRAST  Result Date: 05/19/2021 CLINICAL DATA:  Follow-up glioblastoma. EXAM: MRI HEAD WITHOUT AND WITH CONTRAST TECHNIQUE: Multiplanar, multiecho pulse sequences of the brain and surrounding structures were obtained without and with intravenous contrast. CONTRAST:  29mL MULTIHANCE GADOBENATE DIMEGLUMINE 529 MG/ML IV SOLN COMPARISON:  04/12/2021 FINDINGS: Brain: A resection cavity is again noted in the anterior left temporal lobe with a fluid-fluid level and smooth regional dural enhancement. Heterogeneously enhancing masslike T2 signal abnormality extending posteriorly from the resection cavity along the hippocampus and lateral ventricle has mildly progressed, most notably along its lateral aspect (for example series 26, image 58 and series 28, image 26). However, this enhancing tissue demonstrates low CBV on perfusion imaging. Mild nonenhancing T2 hyperintensity in the left temporal white matter is unchanged. There is no acute infarct, midline shift, or extra-axial fluid collection. The ventricles are unchanged in size. Small T2 hyperintensities elsewhere in the cerebral white matter bilaterally and in the pons have mildly progressed and are nonspecific but may reflect a combination of post treatment changes and mild chronic small vessel ischemia. Vascular: Major intracranial vascular flow voids are preserved. Skull and upper cervical spine: Left temporal craniotomy. Sinuses/Orbits: Unremarkable orbits. Paranasal sinuses and mastoid air cells are clear. Other: None. IMPRESSION: Mildly progressive enhancement in the left temporal lobe with low CBV favoring radiation necrosis. Electronically Signed   By: Logan Bores M.D.   On: 05/19/2021 17:25     Assessment/Plan Glioblastoma, IDH-wildtype ([place]) [C71.9]  Hunter Terry is clinically stable today, now having completed #6 cycles of 5-day Temozolomide.  MRI demonstrates relative stability of pattern of  enhancement without mass affect affecting much of the mesial temporal lobe; when combined with CBV results this more likely represents pseudo-progression.   We recommended continuing treatment with Temozolomide, cycle #7 at $Rem'200mg'tWKo$ /m2, on for five days and off for twenty three days in twenty eight day cycles. The patient will have a complete blood count performed on days 21 and 28 of each cycle, and a comprehensive metabolic panel performed on day 28 of each cycle. Labs may need to be performed more often. Zofran will prescribed for home use for nausea/vomiting.   Chemotherapy should be held for the following:  ANC less than 1,000  Platelets less than 100,000  LFT or creatinine greater than 2x ULN  If clinical concerns/contraindications develop  Decadron should be cut down to $Remov'2mg'FhoJcA$  daily.   Keppra will stay at $Remov'750mg'DHmeSj$  BID.  We ask that Zailyn Rowser return to clinic in 1 months with labs prior to cycle #8, next MRI should be in 2 months.  We appreciate the opportunity to participate in the care  of [employer].    All questions were answered. The patient knows to call the clinic with any problems, questions or concerns. No barriers to learning were detected.  The total time spent in the encounter was 40 minutes and more than 50% was on counseling and review of test results   Ventura Sellers, MD Medical Director of Neuro-Oncology [facility] [place] 05/21/21 12:25 PM

## 2021-05-21 NOTE — Progress Notes (Signed)
Patient communicated through [facility] interpreter regarding denial letter for auth for provider visit.  Staff message sent to [facility] for follow up.

## 2021-05-30 ENCOUNTER — Telehealth: Payer: Self-pay | Admitting: *Deleted

## 2021-05-30 ENCOUNTER — Other Ambulatory Visit: Payer: Self-pay | Admitting: Internal Medicine

## 2021-05-30 MED ORDER — DEXAMETHASONE 4 MG PO TABS: 4.0000 mg | ORAL_TABLET | Freq: Two times a day (BID) | ORAL | 1 refills | Status: DC

## 2021-05-30 NOTE — Telephone Encounter (Signed)
Approached at nurse desk by Lavon Paganini, [place] translator. Ms. Marcina Millard had patient's wife on phone. Via Ms. Sowell's translations, Ms. Meroney stated she was concerned because patient has blood spots in white of his left eye and increased confusion - symptoms began today. Ms. Lechuga wants to know if she should take patient to ED now or make appt to see Dr. Mickeal Skinner. Dr. Mickeal Skinner informed.  Per Dr. Mickeal Skinner - blood in eye not related to brain tumor. Dr. Mickeal Skinner states he will prescribe Decadron 4 mg BID for patient and send prescription to pharmacy. It will take a few days to be effective, so he will contact him early next week to check on him. If patient changes or symptoms worsen, Ms. Savino should contact office. Information given to Ms. Sowell - she conveyed message to Ms. Demonte in [place] while standing at nurse desk. Ms. Marcina Millard stated Ms. Steinborn understood information.

## 2021-06-04 ENCOUNTER — Inpatient Hospital Stay (HOSPITAL_BASED_OUTPATIENT_CLINIC_OR_DEPARTMENT_OTHER): Payer: Medicaid Other | Admitting: Internal Medicine

## 2021-06-04 DIAGNOSIS — R569 Unspecified convulsions: Secondary | ICD-10-CM | POA: Diagnosis not present

## 2021-06-04 DIAGNOSIS — C719 Malignant neoplasm of brain, unspecified: Secondary | ICD-10-CM | POA: Diagnosis not present

## 2021-06-04 NOTE — Progress Notes (Signed)
I connected with Langley Gauss on 06/04/21 at 11:30 AM EDT by telephone visit and verified that I am speaking with the correct person using two identifiers.  I discussed the limitations, risks, security and privacy concerns of performing an evaluation and management service by telemedicine and the availability of in-person appointments. I also discussed with the patient that there may be a patient responsible charge related to this service. The patient expressed understanding and agreed to proceed.  Other persons participating in the visit and their role in the encounter:  n/a  Patient's location:  Home  Provider's location:  Office  Chief Complaint:  Glioblastoma, IDH-wildtype ([place])  Focal seizures ([place])  History of Present Ilness: Armstead Lisby describes improvement in subjective confusion since restarting the higher dose of decadron.  He has been dosing 61m twice per day since late last week, noticed an uptick in clarity and energy.  No further issues with his eye. Observations: Language and cognition at baseline Assessment and Plan: Glioblastoma, IDH-wildtype ([place]  Focal seizures ([place]  Recommended continuing higher dose of decadron 435mBID until next in person visit in 2 weeks.  Follow Up Instructions: RTC in 2 weeks or sooner if needed  I discussed the assessment and treatment plan with the patient.  The patient was provided an opportunity to ask questions and all were answered.  The patient agreed with the plan and demonstrated understanding of the instructions.    The patient was advised to call back or seek an in-person evaluation if the symptoms worsen or if the condition fails to improve as anticipated.  I provided 5-10 minutes of non-face-to-face time during this enocunter.  ZaVentura SellersMD   I provided 15 minutes of non face-to-face telephone visit time during this encounter, and > 50% was spent counseling as documented under my assessment & plan.

## 2021-06-13 ENCOUNTER — Other Ambulatory Visit (HOSPITAL_COMMUNITY): Payer: Self-pay

## 2021-06-15 ENCOUNTER — Other Ambulatory Visit: Payer: Self-pay | Admitting: Internal Medicine

## 2021-06-15 ENCOUNTER — Other Ambulatory Visit (HOSPITAL_COMMUNITY): Payer: Self-pay

## 2021-06-15 DIAGNOSIS — C719 Malignant neoplasm of brain, unspecified: Secondary | ICD-10-CM

## 2021-06-18 ENCOUNTER — Telehealth: Payer: Self-pay | Admitting: Internal Medicine

## 2021-06-18 ENCOUNTER — Inpatient Hospital Stay: Payer: Medicaid Other

## 2021-06-18 ENCOUNTER — Other Ambulatory Visit: Payer: Self-pay

## 2021-06-18 ENCOUNTER — Other Ambulatory Visit (HOSPITAL_COMMUNITY): Payer: Self-pay

## 2021-06-18 ENCOUNTER — Inpatient Hospital Stay: Payer: Medicaid Other | Attending: Internal Medicine | Admitting: Internal Medicine

## 2021-06-18 ENCOUNTER — Encounter: Payer: Self-pay | Admitting: Internal Medicine

## 2021-06-18 VITALS — BP 107/61 | HR 84 | Temp 98.8°F | Resp 18 | Wt 143.1 lb

## 2021-06-18 DIAGNOSIS — C712 Malignant neoplasm of temporal lobe: Secondary | ICD-10-CM | POA: Diagnosis not present

## 2021-06-18 DIAGNOSIS — Z7952 Long term (current) use of systemic steroids: Secondary | ICD-10-CM | POA: Insufficient documentation

## 2021-06-18 DIAGNOSIS — Z9221 Personal history of antineoplastic chemotherapy: Secondary | ICD-10-CM | POA: Insufficient documentation

## 2021-06-18 DIAGNOSIS — Z923 Personal history of irradiation: Secondary | ICD-10-CM | POA: Insufficient documentation

## 2021-06-18 DIAGNOSIS — C719 Malignant neoplasm of brain, unspecified: Secondary | ICD-10-CM

## 2021-06-18 DIAGNOSIS — R400 Somnolence: Secondary | ICD-10-CM | POA: Diagnosis not present

## 2021-06-18 DIAGNOSIS — R569 Unspecified convulsions: Secondary | ICD-10-CM

## 2021-06-18 DIAGNOSIS — R42 Dizziness and giddiness: Secondary | ICD-10-CM | POA: Insufficient documentation

## 2021-06-18 LAB — CMP (CANCER CENTER ONLY)
ALT: 175 U/L — ABNORMAL HIGH (ref 0–44)
AST: 49 U/L — ABNORMAL HIGH (ref 15–41)
Albumin: 3.3 g/dL — ABNORMAL LOW (ref 3.5–5.0)
Alkaline Phosphatase: 81 U/L (ref 38–126)
Anion gap: 8 (ref 5–15)
BUN: 14 mg/dL (ref 6–20)
CO2: 30 mmol/L (ref 22–32)
Calcium: 8.6 mg/dL — ABNORMAL LOW (ref 8.9–10.3)
Chloride: 101 mmol/L (ref 98–111)
Creatinine: 0.72 mg/dL (ref 0.61–1.24)
GFR, Estimated: >60 mL/min
Glucose, Bld: 96 mg/dL (ref 70–99)
Potassium: 4.2 mmol/L (ref 3.5–5.1)
Sodium: 139 mmol/L (ref 135–145)
Total Bilirubin: 0.6 mg/dL (ref 0.3–1.2)
Total Protein: 6 g/dL — ABNORMAL LOW (ref 6.5–8.1)

## 2021-06-18 LAB — CBC WITH DIFFERENTIAL (CANCER CENTER ONLY)
Abs Immature Granulocytes: 0.14 10*3/uL — ABNORMAL HIGH (ref 0.00–0.07)
Basophils Absolute: 0 10*3/uL (ref 0.0–0.1)
Basophils Relative: 0 %
Eosinophils Absolute: 0.1 10*3/uL (ref 0.0–0.5)
Eosinophils Relative: 1 %
HCT: 44.2 % (ref 39.0–52.0)
Hemoglobin: 15.3 g/dL (ref 13.0–17.0)
Immature Granulocytes: 2 %
Lymphocytes Relative: 10 %
Lymphs Abs: 0.6 10*3/uL — ABNORMAL LOW (ref 0.7–4.0)
MCH: 30.1 pg (ref 26.0–34.0)
MCHC: 34.6 g/dL (ref 30.0–36.0)
MCV: 86.8 fL (ref 80.0–100.0)
Monocytes Absolute: 0.6 10*3/uL (ref 0.1–1.0)
Monocytes Relative: 10 %
Neutro Abs: 4.6 10*3/uL (ref 1.7–7.7)
Neutrophils Relative %: 77 %
Platelet Count: 165 10*3/uL (ref 150–400)
RBC: 5.09 MIL/uL (ref 4.22–5.81)
RDW: 14.9 % (ref 11.5–15.5)
WBC Count: 6 10*3/uL (ref 4.0–10.5)
nRBC: 0 % (ref 0.0–0.2)

## 2021-06-18 NOTE — Telephone Encounter (Signed)
Patient will be seen by Dr Mickeal Skinner and have labs prior to reordering this medication.

## 2021-06-18 NOTE — Progress Notes (Signed)
[facility] at [place], [place] [address] [phone]   Interval Evaluation  Date of Service: 06/18/21 [name] Patient MRN: [id number] Patient DOB: 1961/02/27 Provider: Ventura Sellers, MD  Identifying Statement:  Aneesh Faller is a 60 y.o. male with left temporal glioblastoma   Oncologic History: Oncology History  Glioblastoma, IDH-wildtype ([place])  06/15/2020 Surgery   Stereotactic biopsy, L temporal [name]).  Path demonstrates glioblastoma IDH-1 wt   08/02/2020 Surgery   Debulking craniotomy with Dr. [name]   09/05/2020 - 10/17/2020 Radiation Therapy   IMRT with concurrent Temozolomide [name])   11/19/2020 -  Chemotherapy   Initiates cycle #1 adjuvant Temozolomide         Biomarkers:  MGMT Unknown.  IDH 1/2 Wild type.  EGFR Unknown  TERT "Mutated   Interval History:  Storm Sovine presents today for follow up after cycle #7 of adjuvant Temodar ,with MRI brain for evaluation.  No new or progressive deficits, no issues tolerating the chemo, fatigue level is stable. No balance issues, motor dysfunction.  He has not been dosing the decadron since 06/14/21, no issues since stopping.  Continues on Keppra.   H+P (07/24/20) Patient presented to medical attention in late October, 2021 with new onset seizure.  Event was decribed as loss of consciousness, sudden, without clarity on further details aside from altered mental status upon awakening.  CNS imaging demonsrated non-enhancing mass within left temporal lobe c/w likely glioma; he underwent stereotactic biopsy with Dr. [name] on 06/15/20.  There was significant delay in finalizing path, explaining delay in follow up and evaluation.  He denies any seizures since discharge from hospital, on 4 anti-seizure drugs.  He does complain of dizziness and drowsiness with the medications, however.  Also describes impaired short term memory.  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    Medications: Current Outpatient Medications on File Prior to Visit  Medication Sig Dispense Refill   Carboxymethylcellul-Glycerin (LUBRICATING EYE DROPS OP) Place 1 drop into both eyes daily as needed (dry eyes).     dexamethasone (DECADRON) 4 MG tablet Take 1 tablet (4 mg total) by mouth 2 (two) times daily with a meal. 30 tablet 1   levETIRAcetam (KEPPRA) 750 MG tablet Take 1 tablet (750 mg total) by mouth 2 (two) times daily. 60 tablet 0   ondansetron (ZOFRAN) 8 MG tablet Take 1 tablet (8 mg total) by mouth 2 (two) times daily as needed (nausea and vomiting). May take 30-60 minutes prior to Temodar administration if nausea/vomiting occurs. (Patient not taking: Reported on 03/22/2021) 30 tablet 1   ondansetron (ZOFRAN) 8 MG tablet Take 1 tablet (8 mg total) by mouth 2 (two) times daily as needed (nausea and vomiting). May take 30-60 minutes prior to Temodar administration if nausea/vomiting occurs. 30 tablet 1   oxymetazoline (AFRIN) 0.05 % nasal spray Place 1 spray into both nostrils 2 (two) times daily as needed for congestion. (Patient not taking: No sig reported)     temozolomide (TEMODAR) 140 MG capsule Take 1 capsule (140 mg total) by mouth daily. May take on an empty stomach to decrease nausea & vomiting. 5 capsule 0   temozolomide (TEMODAR) 180 MG capsule Take 1 capsule (180 mg total) by mouth daily. May take on an empty stomach to decrease nausea & vomiting. 5 capsule 0   [DISCONTINUED] lacosamide (VIMPAT) 200 MG TABS tablet Take 0.5 tablets (100 mg total) by mouth 2 (two) times daily.  30 tablet 0   No current facility-administered medications on file prior to visit.    Allergies: No Known Allergies Past Medical History:  Past Medical History:  Diagnosis Date   Cancer ([place])    BRAIN TUMOR   Headache    Seizure [facility])    Past Surgical History:  Past Surgical History:  Procedure Laterality Date   APPLICATION OF CRANIAL NAVIGATION N/A 06/15/2020    Procedure: APPLICATION OF CRANIAL NAVIGATION;  Surgeon: [name], MD;  Location: [place];  Service: Neurosurgery;  Laterality: N/A;   APPLICATION OF CRANIAL NAVIGATION N/A 08/02/2020   Procedure: APPLICATION OF CRANIAL NAVIGATION;  Surgeon: [name], MD;  Location: [place];  Service: Neurosurgery;  Laterality: N/A;   CRANIOTOMY N/A 08/02/2020   Procedure: CRANIOTOMY LEFT TEMPORAL LOBECTOMY FOR TUMOR EXCISION;  Surgeon: [name], MD;  Location: [place];  Service: Neurosurgery;  Laterality: N/A;   FRAMELESS  BIOPSY WITH BRAINLAB Left 06/15/2020   Procedure: Left temporal lobe stereotactic brain biopsy with brainlab;  Surgeon: [name], MD;  Location: [place];  Service: Neurosurgery;  Laterality: Left;   Social History:  Social History   Socioeconomic History   Marital status: Married    Spouse name: Not on file   Number of children: Not on file   Years of education: Not on file   Highest education level: Not on file  Occupational History   Not on file  Tobacco Use   Smoking status: Never   Smokeless tobacco: Never  Substance and Sexual Activity   Alcohol use: Never   Drug use: Not on file   Sexual activity: Yes    Partners: Female  Other Topics Concern   Not on file  Social History Narrative   Not on file   Social Determinants of Health   Financial Resource Strain: Not on file  Food Insecurity: Not on file  Transportation Needs: Not on file  Physical Activity: Not on file  Stress: Not on file  Social Connections: Not on file  Intimate Partner Violence: Not on file   Family History:  Family History  Problem Relation Age of Onset   Cancer Neg Hx     Review of Systems: Constitutional: Doesn't report fevers, chills or abnormal weight loss Eyes: Doesn't report blurriness of vision Ears, nose, mouth, throat, and face: Doesn't report sore throat Respiratory: Doesn't report cough, dyspnea or wheezes Cardiovascular: Doesn't report palpitation,  chest discomfort  Gastrointestinal:  Doesn't report nausea, constipation, diarrhea GU: Doesn't report incontinence Skin: Doesn't report skin rashes Neurological: Per HPI Musculoskeletal: Doesn't report joint pain Behavioral/Psych: Doesn't report anxiety  Physical Exam: Vitals:   06/18/21 1215  BP: 107/61  Pulse: 84  Resp: 18  Temp: 98.8 F (37.1 C)  SpO2: 99%     KPS: 90. General: Alert, cooperative, pleasant, in no acute distress Head: Normal EENT: No conjunctival injection or scleral icterus.  Lungs: Resp effort normal Cardiac: Regular rate Abdomen: Non-distended abdomen Skin: No rashes cyanosis or petechiae. Extremities: No clubbing or edema  Neurologic Exam: Mental Status: Awake, alert, attentive to examiner. Oriented to self and environment. Language is fluent with intact comprehension.  Cranial Nerves: Visual acuity is grossly normal. Visual fields are full. Extra-ocular movements intact. No ptosis. Face is symmetric Motor: Tone and bulk are normal. Power is full in both arms and legs. Reflexes are symmetric, no pathologic reflexes present.  Sensory: Intact to light touch Gait: Normal.   Labs: I have reviewed the data as listed  Component Value Date/Time   NA 139 06/18/2021 1202   K 4.2 06/18/2021 1202   CL 101 06/18/2021 1202   CO2 30 06/18/2021 1202   GLUCOSE 96 06/18/2021 1202   BUN 14 06/18/2021 1202   CREATININE 0.72 06/18/2021 1202   CALCIUM 8.6 (L) 06/18/2021 1202   PROT 6.0 (L) 06/18/2021 1202   ALBUMIN 3.3 (L) 06/18/2021 1202   AST 49 (H) 06/18/2021 1202   ALT 175 (H) 06/18/2021 1202   ALKPHOS 81 06/18/2021 1202   BILITOT 0.6 06/18/2021 1202   GFRNONAA >60 06/18/2021 1202   Lab Results  Component Value Date   WBC 6.0 06/18/2021   NEUTROABS 4.6 06/18/2021   HGB 15.3 06/18/2021   HCT 44.2 06/18/2021   MCV 86.8 06/18/2021   PLT 165 06/18/2021    Assessment/Plan Glioblastoma, IDH-wildtype ([place]) [C71.9]  Hermilo Simington is clinically  stable today, now having completed #7 cycles of 5-day Temozolomide.  Labs are notable for moderate trans-aminitis today, asymptomatic.   We recommended holding off on this month's chemo to allow liver enzymes room to recover from what is likely effect of alkylating therapy.  Chemotherapy should be held for the following:  ANC less than 1,000  Platelets less than 100,000  LFT or creatinine greater than 2x ULN  If clinical concerns/contraindications develop  Decadron can stay off board for the time being if tolerated  Keppra will stay at $Remov'750mg'kykwib$  BID.  We ask that [place] return to clinic in 1 months with labs prior to cycle #8, next MRI should be in 2 months.  We appreciate the opportunity to participate in the care of [place].    All questions were answered. The patient knows to call the clinic with any problems, questions or concerns. No barriers to learning were detected.  The total time spent in the encounter was 40 minutes and more than 50% was on counseling and review of test results   Ventura Sellers, MD Medical Director of Neuro-Oncology [facility] at [place] 06/18/21 12:50 PM

## 2021-06-18 NOTE — Telephone Encounter (Signed)
Scheduled per 11/7 los pt has been called and confirmed appt

## 2021-06-19 ENCOUNTER — Other Ambulatory Visit (HOSPITAL_COMMUNITY): Payer: Self-pay

## 2021-06-19 ENCOUNTER — Other Ambulatory Visit: Payer: Self-pay | Admitting: Internal Medicine

## 2021-06-19 DIAGNOSIS — C719 Malignant neoplasm of brain, unspecified: Secondary | ICD-10-CM

## 2021-06-19 NOTE — Telephone Encounter (Signed)
Per Dr Mickeal Skinner do not refill, liver enzymes are abnormal.

## 2021-07-12 ENCOUNTER — Other Ambulatory Visit: Payer: Self-pay | Admitting: Radiation Therapy

## 2021-07-16 ENCOUNTER — Ambulatory Visit (HOSPITAL_COMMUNITY)
Admission: RE | Admit: 2021-07-16 | Discharge: 2021-07-16 | Disposition: A | Discharge status: Home / Self Care | Payer: Medicaid Other | Source: Ambulatory Visit | Attending: Internal Medicine | Admitting: Internal Medicine

## 2021-07-16 ENCOUNTER — Other Ambulatory Visit (HOSPITAL_COMMUNITY): Payer: Self-pay

## 2021-07-16 DIAGNOSIS — C719 Malignant neoplasm of brain, unspecified: Secondary | ICD-10-CM | POA: Insufficient documentation

## 2021-07-16 IMAGING — MR MR HEAD WO/W CM
15 series · 48 of 48 positions shown · IV contrast (gadavist)
Comparison: [DATE]

CLINICAL DATA: Glioblastoma, assess treatment response.

EXAM:
MRI HEAD WITHOUT AND WITH CONTRAST
TECHNIQUE: Multiplanar, multiecho pulse sequences of the brain and surrounding
structures were obtained without and with intravenous contrast.
CONTRAST:  7mL GADAVIST GADOBUTROL 1 MMOL/ML IV SOLN

[Series 5: DWI · axial · 3.0mm · 1.36mm/px · z∈[-32,+108]mm · 6 of 96 slices shown (1 of 2)]
[im 1/96]
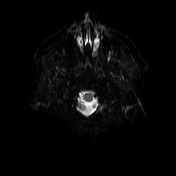
[im 20/96]
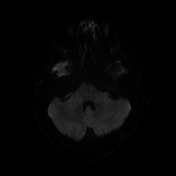
[im 39/96]
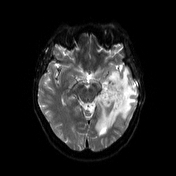
[im 58/96]
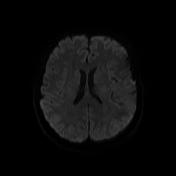
[im 77/96]
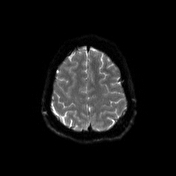
[im 96/96]
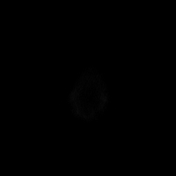

[Series 6: DWI · axial · 3.0mm · 1.36mm/px · z∈[-32,+108]mm · 3 of 48 slices shown (2 of 2)]
[im 1/48]
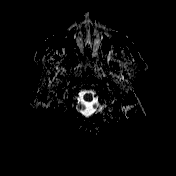
[im 24/48]
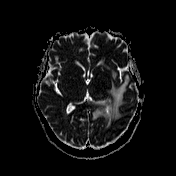
[im 48/48]
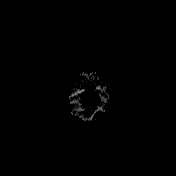

[Series 7: T1 · sagittal · 5.0mm · 0.75mm/px · 1 of 24 slices shown (1 of 4)]
[im 1/24]
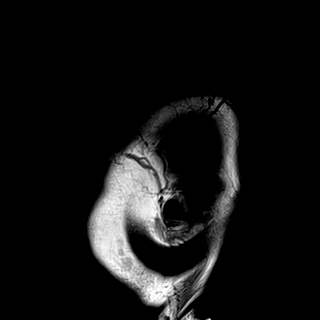

[Series 8: T2 · axial · 5.0mm · 0.62mm/px · 1 of 24 slices shown]
[im 1/24]
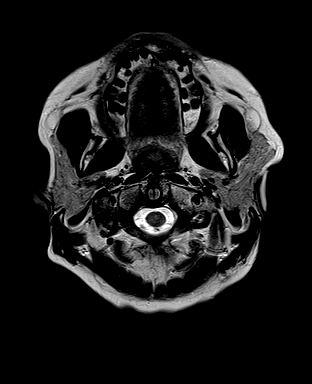

[Series 9: swi_images · axial · 3.0mm · 0.75mm/px · z∈[-43,+109]mm · 3 of 52 slices shown]
[im 1/52]
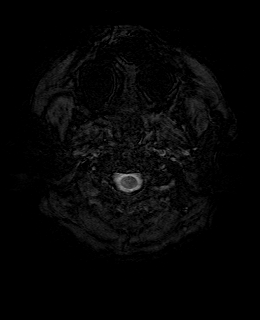
[im 26/52]
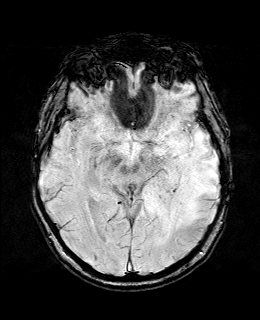
[im 52/52]
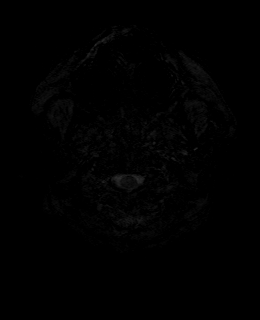

[Series 11: FLAIR · axial · 3.0mm · 0.75mm/px · z∈[-43,+109]mm · 3 of 52 slices shown]
[im 1/52]
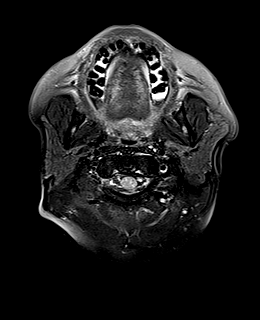
[im 26/52]
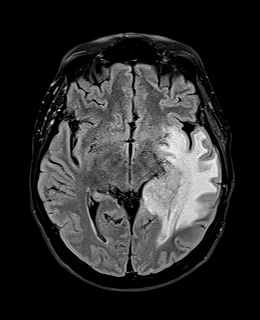
[im 52/52]
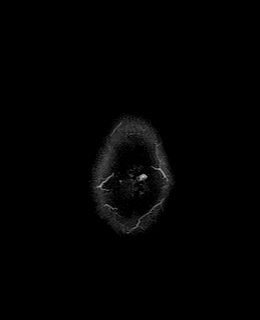

[Series 12: T1 · axial · 1.0mm · 0.94mm/px · z∈[-32,+110]mm · 9 of 144 slices shown (2 of 4)]
[im 1/144]
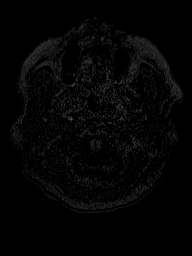
[im 18/144]
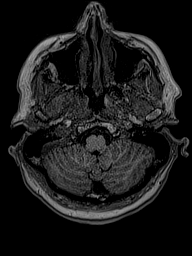
[im 36/144]
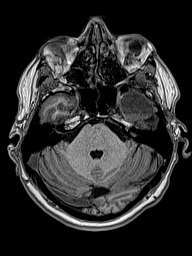
[im 54/144]
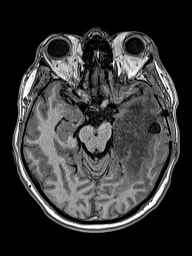
[im 72/144]
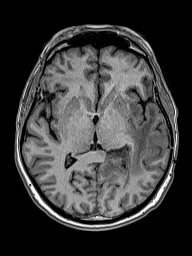
[im 90/144]
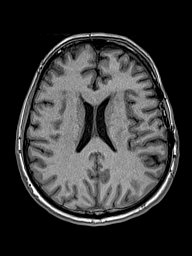
[im 108/144]
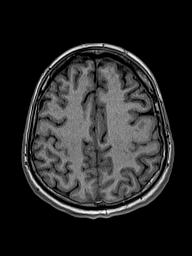
[im 126/144]
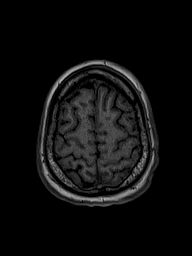
[im 144/144]
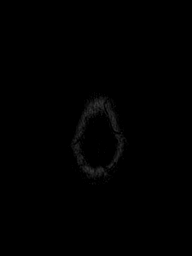

[Series 13: cor dwi_tracew · coronal · 5.0mm · 1.53mm/px · 3 of 48 slices shown]
[im 1/48]
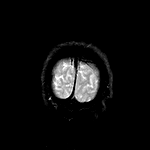
[im 24/48]
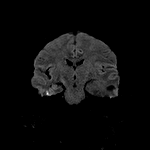
[im 48/48]
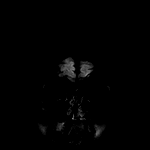

[Series 14: cor dwi_adc · coronal · 5.0mm · 1.53mm/px · 1 of 24 slices shown]
[im 1/24]
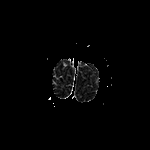

[Series 15: T2 post-contrast · coronal · 5.0mm · 0.57mm/px · 2 of 28 slices shown]
[im 1/28]
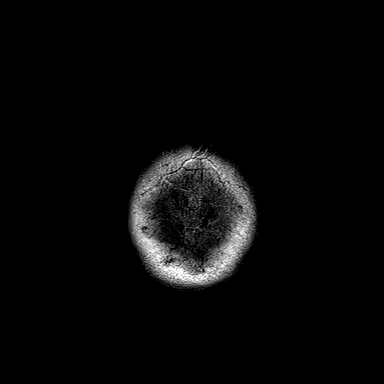
[im 28/28]
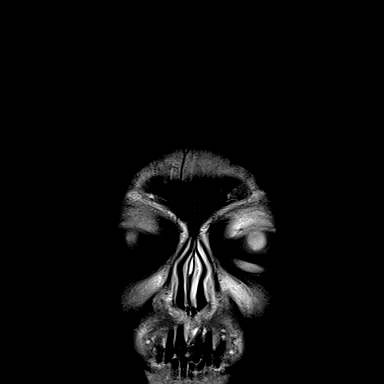

[Series 16: T1 post-contrast · axial · 1.0mm · 0.94mm/px · z∈[-32,+110]mm · 9 of 144 slices shown (1 of 3)]
[im 1/144]
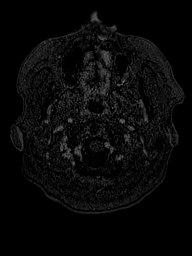
[im 18/144]
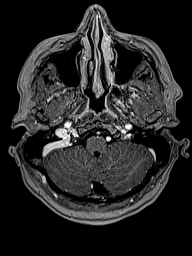
[im 36/144]
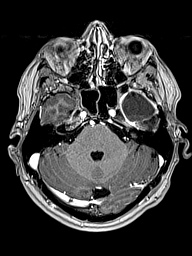
[im 54/144]
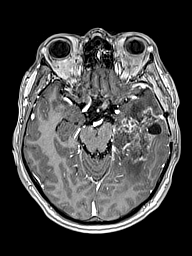
[im 72/144]
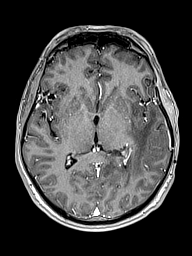
[im 90/144]
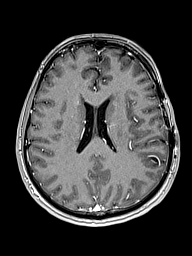
[im 108/144]
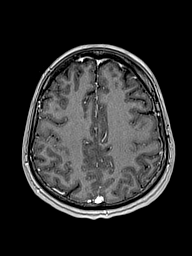
[im 126/144]
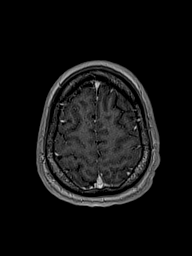
[im 144/144]
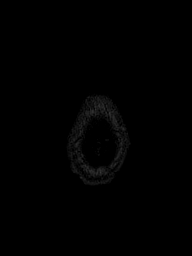

[Series 17: T1 · sagittal · 4.0mm · 0.94mm/px · 2 of 30 slices shown (3 of 4)]
[im 1/30]
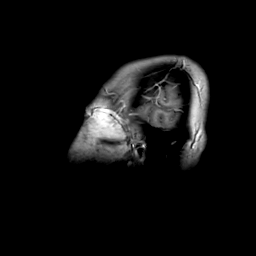
[im 30/30]
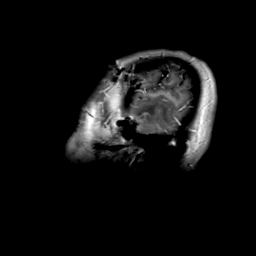

[Series 18: T1 · coronal · 4.0mm · 0.94mm/px · 2 of 36 slices shown (4 of 4)]
[im 1/36]
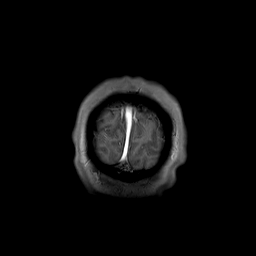
[im 36/36]
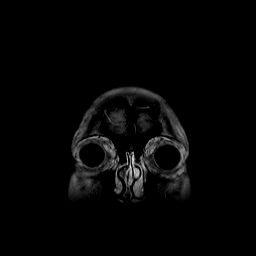

[Series 19: T1 post-contrast · coronal · 5.0mm · 0.43mm/px · 2 of 28 slices shown (2 of 3)]
[im 1/28]
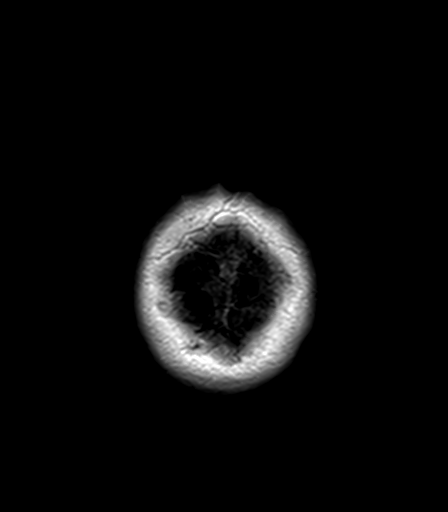
[im 28/28]
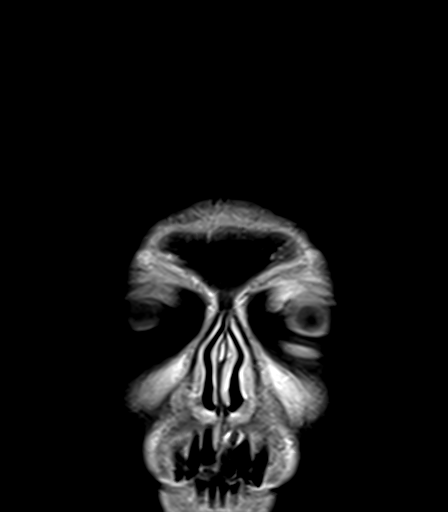

[Series 20: T1 post-contrast · sagittal · 5.0mm · 0.75mm/px · 1 of 24 slices shown (3 of 3)]
[im 1/24]
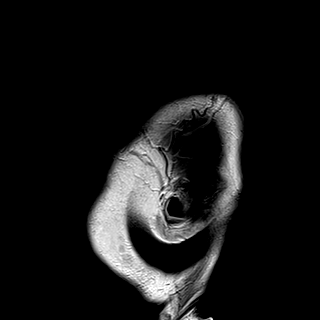

[48 of 48 positions shown; findings below may reference images not displayed]

FINDINGS: Brain: Heterogeneously enhancing mass in the left temporal lobe with
anterior cystic cavities, the largest containing a fluid fluid
level. There has been generalized progression both of the enhancing
volume and of the degree of surrounding T2 hyperintensity with
swelling. Increased mild mass effect on the upper brain stem. There
is diffuse involvement of the left hippocampus. No intraventricular
or limbic spread since prior. No acute infarct or hemorrhage. No
hydrocephalus.

Vascular: Major flow voids and vascular enhancements are preserved

Skull and upper cervical spine: Unremarkable craniotomy

Sinuses/Orbits: Negative
IMPRESSION: Progressive enhancement, T2 signal, and swelling at the left
temporal mass.

## 2021-07-16 MED ORDER — GADOBUTROL 1 MMOL/ML IV SOLN
7.0000 mL | Freq: Once | INTRAVENOUS | Status: AC | PRN
  Administered 2021-07-16: 7 mL via INTRAVENOUS

## 2021-07-17 ENCOUNTER — Inpatient Hospital Stay (HOSPITAL_BASED_OUTPATIENT_CLINIC_OR_DEPARTMENT_OTHER): Payer: Medicaid Other | Admitting: Internal Medicine

## 2021-07-17 ENCOUNTER — Other Ambulatory Visit: Payer: Self-pay

## 2021-07-17 ENCOUNTER — Inpatient Hospital Stay: Payer: Medicaid Other | Attending: Internal Medicine

## 2021-07-17 ENCOUNTER — Other Ambulatory Visit (HOSPITAL_COMMUNITY): Payer: Self-pay

## 2021-07-17 VITALS — BP 116/64 | HR 65 | Temp 97.3°F | Resp 18 | Ht 65.0 in | Wt 146.0 lb

## 2021-07-17 DIAGNOSIS — Z79899 Other long term (current) drug therapy: Secondary | ICD-10-CM | POA: Diagnosis not present

## 2021-07-17 DIAGNOSIS — C719 Malignant neoplasm of brain, unspecified: Secondary | ICD-10-CM

## 2021-07-17 DIAGNOSIS — Z5111 Encounter for antineoplastic chemotherapy: Secondary | ICD-10-CM | POA: Insufficient documentation

## 2021-07-17 DIAGNOSIS — C712 Malignant neoplasm of temporal lobe: Secondary | ICD-10-CM | POA: Insufficient documentation

## 2021-07-17 DIAGNOSIS — R569 Unspecified convulsions: Secondary | ICD-10-CM

## 2021-07-17 LAB — CBC WITH DIFFERENTIAL (CANCER CENTER ONLY)
Abs Immature Granulocytes: 0.02 10*3/uL (ref 0.00–0.07)
Basophils Absolute: 0 10*3/uL (ref 0.0–0.1)
Basophils Relative: 1 %
Eosinophils Absolute: 0 10*3/uL (ref 0.0–0.5)
Eosinophils Relative: 1 %
HCT: 42.6 % (ref 39.0–52.0)
Hemoglobin: 14.7 g/dL (ref 13.0–17.0)
Immature Granulocytes: 1 %
Lymphocytes Relative: 21 %
Lymphs Abs: 0.9 10*3/uL (ref 0.7–4.0)
MCH: 29.3 pg (ref 26.0–34.0)
MCHC: 34.5 g/dL (ref 30.0–36.0)
MCV: 85 fL (ref 80.0–100.0)
Monocytes Absolute: 0.5 10*3/uL (ref 0.1–1.0)
Monocytes Relative: 11 %
Neutro Abs: 2.8 10*3/uL (ref 1.7–7.7)
Neutrophils Relative %: 65 %
Platelet Count: 288 10*3/uL (ref 150–400)
RBC: 5.01 MIL/uL (ref 4.22–5.81)
RDW: 13.6 % (ref 11.5–15.5)
WBC Count: 4.3 10*3/uL (ref 4.0–10.5)
nRBC: 0 % (ref 0.0–0.2)

## 2021-07-17 LAB — CMP (CANCER CENTER ONLY)
ALT: 14 U/L (ref 0–44)
AST: 18 U/L (ref 15–41)
Albumin: 3.9 g/dL (ref 3.5–5.0)
Alkaline Phosphatase: 60 U/L (ref 38–126)
Anion gap: 9 (ref 5–15)
BUN: 4 mg/dL — ABNORMAL LOW (ref 6–20)
CO2: 28 mmol/L (ref 22–32)
Calcium: 9.6 mg/dL (ref 8.9–10.3)
Chloride: 102 mmol/L (ref 98–111)
Creatinine: 0.78 mg/dL (ref 0.61–1.24)
GFR, Estimated: >60 mL/min (ref >60)
Glucose, Bld: 93 mg/dL (ref 70–99)
Potassium: 4.2 mmol/L (ref 3.5–5.1)
Sodium: 139 mmol/L (ref 135–145)
Total Bilirubin: 0.6 mg/dL (ref 0.3–1.2)
Total Protein: 7.2 g/dL (ref 6.5–8.1)

## 2021-07-17 MED ORDER — LOMUSTINE 100 MG PO CAPS
100.0000 mg | ORAL_CAPSULE | Freq: Once | ORAL | 0 refills | Status: DC
Start: 2021-07-17 — End: 2021-07-18
  Filled 2021-07-17: qty 1, 1d supply, fill #0

## 2021-07-17 MED ORDER — ONDANSETRON HCL 8 MG PO TABS
8.0000 mg | ORAL_TABLET | Freq: Two times a day (BID) | ORAL | 1 refills | Status: DC | PRN
  Filled 2021-07-17: qty 30, 15d supply, fill #0

## 2021-07-17 MED ORDER — LOMUSTINE 40 MG PO CAPS
80.0000 mg | ORAL_CAPSULE | Freq: Once | ORAL | 0 refills | Status: DC
  Filled 2021-07-17: qty 2, 1d supply, fill #0

## 2021-07-17 NOTE — Progress Notes (Signed)
DISCONTINUE ON PATHWAY REGIMEN - Neuro     A cycle is every 28 days:     Temozolomide      Temozolomide   **Always confirm dose/schedule in your pharmacy ordering system**  REASON: Disease Progression PRIOR TREATMENT: [id number]: Temozolomide 150/200 mg/m2 PO D1-5 q28 Days x up to 12 Cycles TREATMENT RESPONSE: Progressive Disease (PD)  START ON PATHWAY REGIMEN - Neuro     A cycle is every 42 days:     Lomustine   **Always confirm dose/schedule in your pharmacy ordering system**  Patient Characteristics: Glioma, Glioblastoma, IDH-wildtype, Recurrent or Progressive, Nonsurgical Candidate, Systemic Therapy Candidate, BRAF V600E Mutation Negative/Unknown and NTRK Fusion Negative/Unknown Disease Classification: Glioma Disease Classification: Glioblastoma, IDH-wildtype Disease Status: Recurrent or Progressive Treatment Classification: Nonsurgical Candidate Treatment (Nonsurgical/Adjuvant): Systemic Therapy Candidate NTRK Gene Fusion Status: Negative BRAF V600E Mutation Status: Negative Intent of Therapy: Non-Curative / Palliative Intent, Discussed with Patient

## 2021-07-17 NOTE — Progress Notes (Signed)
[facility] [address], [place] [address] [phone]   Interval Evaluation  Date of Service: 07/17/21 [name] Patient MRN: [id number] Patient DOB: 1960-08-31 Provider: Ventura Sellers, MD  Identifying Statement:  Hunter Terry is a 60 y.o. male with left temporal glioblastoma   Oncologic History: Oncology History  Glioblastoma, IDH-wildtype ([place])  06/15/2020 Surgery   Stereotactic biopsy, L temporal Hunter Terry).  Path demonstrates glioblastoma IDH-1 wt   08/02/2020 Surgery   Debulking craniotomy with Dr. Marcello Terry   09/05/2020 - 10/17/2020 Radiation Therapy   IMRT with concurrent Temozolomide [name])   11/19/2020 -  Chemotherapy   Initiates cycle #1 adjuvant Temozolomide         Biomarkers:  MGMT Unknown.  IDH 1/2 Wild type.  EGFR Unknown  TERT "Mutated   Interval History:  Hunter Terry presents today for follow up after recent MRI brain.  No new or progressive deficits today, fatigue level is stable. No balance issues, motor dysfunction.  He has not been dosing the decadron since 06/14/21.  Continues on Keppra.   H+P (07/24/20) Patient presented to medical attention in late October, 2021 with new onset seizure.  Event was decribed as loss of consciousness, sudden, without clarity on further details aside from altered mental status upon awakening.  CNS imaging demonsrated non-enhancing mass within left temporal lobe c/w likely glioma; he underwent stereotactic biopsy with Dr. Marcello Terry on 06/15/20.  There was significant delay in finalizing path, explaining delay in follow up and evaluation.  He denies any seizures since discharge from hospital, on 4 anti-seizure drugs.  He does complain of dizziness and drowsiness with the medications, however.  Also describes impaired short term memory.  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    Medications: Current Outpatient Medications on File Prior to Visit   Medication Sig Dispense Refill   Carboxymethylcellul-Glycerin (LUBRICATING EYE DROPS OP) Place 1 drop into both eyes daily as needed (dry eyes).     dexamethasone (DECADRON) 4 MG tablet Take 1 tablet (4 mg total) by mouth 2 (two) times daily with a meal. 30 tablet 1   oxymetazoline (AFRIN) 0.05 % nasal spray Place 1 spray into both nostrils 2 (two) times daily as needed for congestion.     levETIRAcetam (KEPPRA) 750 MG tablet Take 1 tablet (750 mg total) by mouth 2 (two) times daily. 60 tablet 0   ondansetron (ZOFRAN) 8 MG tablet Take 1 tablet (8 mg total) by mouth 2 (two) times daily as needed (nausea and vomiting). May take 30-60 minutes prior to Temodar administration if nausea/vomiting occurs. 30 tablet 1   ondansetron (ZOFRAN) 8 MG tablet Take 1 tablet (8 mg total) by mouth 2 (two) times daily as needed (nausea and vomiting). May take 30-60 minutes prior to Temodar administration if nausea/vomiting occurs. 30 tablet 1   temozolomide (TEMODAR) 140 MG capsule Take 1 capsule (140 mg total) by mouth daily. May take on an empty stomach to decrease nausea & vomiting. (Patient not taking: Reported on 07/17/2021) 5 capsule 0   temozolomide (TEMODAR) 180 MG capsule Take 1 capsule (180 mg total) by mouth daily. May take on an empty stomach to decrease nausea & vomiting. (Patient not taking: Reported on 07/17/2021) 5 capsule 0   [DISCONTINUED] lacosamide (VIMPAT) 200 MG TABS tablet Take 0.5 tablets (100 mg total) by mouth 2 (two) times daily. 30 tablet 0   No current facility-administered medications on file prior to visit.  Allergies: No Known Allergies Past Medical History:  Past Medical History:  Diagnosis Date   Cancer ([place])    BRAIN TUMOR   Headache    Seizure [facility])    Past Surgical History:  Past Surgical History:  Procedure Laterality Date   APPLICATION OF CRANIAL NAVIGATION N/A 06/15/2020   Procedure: APPLICATION OF CRANIAL NAVIGATION;  Surgeon: [name], MD;  Location: [facility];   Service: Neurosurgery;  Laterality: N/A;   APPLICATION OF CRANIAL NAVIGATION N/A 08/02/2020   Procedure: APPLICATION OF CRANIAL NAVIGATION;  Surgeon: [name], MD;  Location: [facility];  Service: Neurosurgery;  Laterality: N/A;   CRANIOTOMY N/A 08/02/2020   Procedure: CRANIOTOMY LEFT TEMPORAL LOBECTOMY FOR TUMOR EXCISION;  Surgeon: [name], MD;  Location: [facility];  Service: Neurosurgery;  Laterality: N/A;   FRAMELESS  BIOPSY WITH BRAINLAB Left 06/15/2020   Procedure: Left temporal lobe stereotactic brain biopsy with brainlab;  Surgeon: [name], MD;  Location: [facility];  Service: Neurosurgery;  Laterality: Left;   Social History:  Social History   Socioeconomic History   Marital status: Married    Spouse name: Not on file   Number of children: Not on file   Years of education: Not on file   Highest education level: Not on file  Occupational History   Not on file  Tobacco Use   Smoking status: Never   Smokeless tobacco: Never  Substance and Sexual Activity   Alcohol use: Never   Drug use: Not on file   Sexual activity: Yes    Partners: Female  Other Topics Concern   Not on file  Social History Narrative   Not on file   Social Determinants of Health   Financial Resource Strain: Not on file  Food Insecurity: Not on file  Transportation Needs: Not on file  Physical Activity: Not on file  Stress: Not on file  Social Connections: Not on file  Intimate Partner Violence: Not on file   Family History:  Family History  Problem Relation Age of Onset   Cancer Neg Hx     Review of Systems: Constitutional: Doesn't report fevers, chills or abnormal weight loss Eyes: Doesn't report blurriness of vision Ears, nose, mouth, throat, and face: Doesn't report sore throat Respiratory: Doesn't report cough, dyspnea or wheezes Cardiovascular: Doesn't report palpitation, chest discomfort  Gastrointestinal:  Doesn't report nausea, constipation, diarrhea GU: Doesn't  report incontinence Skin: Doesn't report skin rashes Neurological: Per HPI Musculoskeletal: Doesn't report joint pain Behavioral/Psych: Doesn't report anxiety  Physical Exam: Vitals:   07/17/21 1059  BP: 116/64  Pulse: 65  Resp: 18  Temp: (!) 97.3 F (36.3 C)  SpO2: 99%     KPS: 90. General: Alert, cooperative, pleasant, in no acute distress Head: Normal EENT: No conjunctival injection or scleral icterus.  Lungs: Resp effort normal Cardiac: Regular rate Abdomen: Non-distended abdomen Skin: No rashes cyanosis or petechiae. Extremities: No clubbing or edema  Neurologic Exam: Mental Status: Awake, alert, attentive to examiner. Oriented to self and environment. Language is fluent with intact comprehension.  Cranial Nerves: Visual acuity is grossly normal. Visual fields are full. Extra-ocular movements intact. No ptosis. Face is symmetric Motor: Tone and bulk are normal. Power is full in both arms and legs. Reflexes are symmetric, no pathologic reflexes present.  Sensory: Intact to light touch Gait: Normal.   Labs: I have reviewed the data as listed    Component Value Date/Time   NA 139 07/17/2021 1040   K 4.2  07/17/2021 1040   CL 102 07/17/2021 1040   CO2 28 07/17/2021 1040   GLUCOSE 93 07/17/2021 1040   BUN 4 (L) 07/17/2021 1040   CREATININE 0.78 07/17/2021 1040   CALCIUM 9.6 07/17/2021 1040   PROT 7.2 07/17/2021 1040   ALBUMIN 3.9 07/17/2021 1040   AST 18 07/17/2021 1040   ALT 14 07/17/2021 1040   ALKPHOS 60 07/17/2021 1040   BILITOT 0.6 07/17/2021 1040   GFRNONAA >60 07/17/2021 1040   Lab Results  Component Value Date   WBC 4.3 07/17/2021   NEUTROABS 2.8 07/17/2021   HGB 14.7 07/17/2021   HCT 42.6 07/17/2021   MCV 85.0 07/17/2021   PLT 288 07/17/2021   Imaging:  [place] Clinician Interpretation: I have personally reviewed the CNS images as listed.  My interpretation, in the context of the patient's clinical presentation, is progressive disease  MR  BRAIN W WO CONTRAST  Result Date: 07/17/2021 CLINICAL DATA:  Glioblastoma, assess treatment response. EXAM: MRI HEAD WITHOUT AND WITH CONTRAST TECHNIQUE: Multiplanar, multiecho pulse sequences of the brain and surrounding structures were obtained without and with intravenous contrast. CONTRAST:  6m GADAVIST GADOBUTROL 1 MMOL/ML IV SOLN COMPARISON:  05/18/2021 FINDINGS: Brain: Heterogeneously enhancing mass in the left temporal lobe with anterior cystic cavities, the largest containing a fluid fluid level. There has been generalized progression both of the enhancing volume and of the degree of surrounding T2 hyperintensity with swelling. Increased mild mass effect on the upper brain stem. There is diffuse involvement of the left hippocampus. No intraventricular or limbic spread since prior. No acute infarct or hemorrhage. No hydrocephalus. Vascular: Major flow voids and vascular enhancements are preserved Skull and upper cervical spine: Unremarkable craniotomy Sinuses/Orbits: Negative IMPRESSION: Progressive enhancement, T2 signal, and swelling at the left temporal mass. Electronically Signed   By: JJorje GuildM.D.   On: 07/17/2021 04:29    Assessment/Plan Glioblastoma, IDH-wildtype ([place] [C71.9]  [place] clinically stable today, but MRI brain demonstrates clear progression of disease with regards to both enhancing and T2 signal burden.   We recommended transition to second line therapy with CCNU 99mm2 PO q6 weeks, and Avastin 1077mg IV q2 weeks.  We reviewed side effects of CCNU, fatigue, nausea/vomiting, cytopenias; and Avastin, hypertension, bleeding/clotting, wound healing impairment.  He is agreeable with discontinuing Temodar and transitioning to CCNU/Avastin.  Informed consent was obtained verbally at bedside to proceed with oral chemotherapy.  Chemotherapy should be held for the following:  ANC less than 1,000  Platelets less than 100,000  LFT or creatinine greater than 2x  ULN  If clinical concerns/contraindications develop  Avastin should be held for the following:  ANC less than 500  Platelets less than 50,000  LFT or creatinine greater than 2x ULN  If clinical concerns/contraindications develop  Keppra will stay at 750m16mD.  We ask that EuclKoltyn Kelsayurn to clinic for initial Avastin infusion, once scheduled.  He will dose first cycle of CCNU that day as well.  We appreciate the opportunity to participate in the care of [place] All questions were answered. The patient knows to call the clinic with any problems, questions or concerns. No barriers to learning were detected.  The total time spent in the encounter was 40 minutes and more than 50% was on counseling and review of test results   ZachVentura Sellers Medical Director of Neuro-Oncology [facility] HospitalWesl[facility]ensas06/22 11:29 AM

## 2021-07-18 ENCOUNTER — Other Ambulatory Visit (HOSPITAL_COMMUNITY): Payer: Self-pay

## 2021-07-18 ENCOUNTER — Telehealth: Payer: Self-pay | Admitting: Pharmacist

## 2021-07-18 DIAGNOSIS — C719 Malignant neoplasm of brain, unspecified: Secondary | ICD-10-CM

## 2021-07-18 MED ORDER — LOMUSTINE 40 MG PO CAPS
40.0000 mg | ORAL_CAPSULE | Freq: Once | ORAL | 0 refills | Status: DC
  Filled 2021-07-18: qty 1, 1d supply, fill #0

## 2021-07-18 MED ORDER — LOMUSTINE 10 MG PO CAPS
20.0000 mg | ORAL_CAPSULE | Freq: Once | ORAL | 0 refills | Status: DC
  Filled 2021-07-18: qty 2, 1d supply, fill #0

## 2021-07-18 MED ORDER — LOMUSTINE 100 MG PO CAPS
100.0000 mg | ORAL_CAPSULE | Freq: Once | ORAL | 0 refills | Status: DC
  Filled 2021-07-18: qty 1, 1d supply, fill #0

## 2021-07-18 NOTE — Telephone Encounter (Signed)
Oral Oncology Pharmacist Encounter  Received new prescription for Gleostine (lomustine) for the treatment of glioblastoma in conjunction with bevacizumab, planned duration until disease progression or unacceptable drug toxicity.  CBC w/ Diff and CMP from 07/18/21 assessed, no relevant lab abnormalities noted. Prescription dose and frequency assessed for appropriateness. Patient will receive lomustine 90 mg/m2 PO every 6 weeks when in combination with bevacizumab. Appropriate for therapy initiation.   Current medication list in Epic reviewed, no relevant/significant DDIs with Gleostine identified.  Evaluated chart and no patient barriers to medication adherence noted.   Patient agreement for treatment documented in MD note on 07/17/21.  Prescription has been e-scribed to the [facility] for benefits analysis and approval. Unfortunately medicaid is not contracted with manufacturer for [employer], so we will proceed with obtaining medication through [employer] Patient [place].   Called and spoke to patient's wife, Patrecia Pour via interpreter Verdis Frederickson (ID: [phone]). Wife and husband will come and bring proof of income required for manufacturer assistance application to the cancer center either 12/8 or 12/9.   Oral Oncology Clinic will continue to follow for insurance authorization, copayment issues, initial counseling and start date.  Leron Croak, PharmD, BCPS Hematology/Oncology Clinical Pharmacist [facility] [place] [phone] 07/18/2021 9:24 AM

## 2021-07-19 ENCOUNTER — Other Ambulatory Visit: Payer: Self-pay | Admitting: Internal Medicine

## 2021-07-19 ENCOUNTER — Other Ambulatory Visit: Payer: Self-pay | Admitting: Pharmacist

## 2021-07-19 DIAGNOSIS — C719 Malignant neoplasm of brain, unspecified: Secondary | ICD-10-CM

## 2021-07-19 MED ORDER — LOMUSTINE 10 MG PO CAPS: 20.0000 mg | ORAL_CAPSULE | Freq: Once | ORAL | 0 refills | Status: AC

## 2021-07-19 MED ORDER — LOMUSTINE 40 MG PO CAPS: 40.0000 mg | ORAL_CAPSULE | Freq: Once | ORAL | 0 refills | Status: AC

## 2021-07-19 MED ORDER — LOMUSTINE 100 MG PO CAPS: 100.0000 mg | ORAL_CAPSULE | Freq: Once | ORAL | 0 refills | Status: AC

## 2021-07-19 NOTE — Progress Notes (Signed)
Pharmacist Chemotherapy Monitoring - Initial Assessment    Anticipated start date: 07/26/21   The following has been reviewed per standard work regarding the patient's treatment regimen: The patient's diagnosis, treatment plan and drug doses, and organ/hematologic function Lab orders and baseline tests specific to treatment regimen  The treatment plan start date, drug sequencing, and pre-medications Prior authorization status  Patient's documented medication list, including drug-drug interaction screen and prescriptions for anti-emetics and supportive care specific to the treatment regimen The drug concentrations, fluid compatibility, administration routes, and timing of the medications to be used The patient's access for treatment and lifetime cumulative dose history, if applicable  The patient's medication allergies and previous infusion related reactions, if applicable   Changes made to treatment plan:  N/A  Follow up needed:  N/A   Larene Beach, RPH, 07/19/2021  8:38 AM

## 2021-07-19 NOTE — Telephone Encounter (Signed)
Oral Oncology Pharmacist Encounter  Met with patient while in clinic today to complete application for [employer] Patient Assistance Program in an effort to reduce the patient's out of pocket expense for Gleostine to $0.  Application completed and faxed to: [phone]  [employer] Patient assistance phone number for follow up is: [phone], option 2  This encounter will be updated until final determination.   Leron Croak, PharmD, BCPS Hematology/Oncology Clinical Pharmacist [facility] [phone] 07/19/2021 3:41 PM

## 2021-07-20 NOTE — Telephone Encounter (Addendum)
Oral Oncology Pharmacist Encounter  Received notification from [employer] Patient Assistance program that patient has been successfully enrolled into their program to receive Gleostine from the manufacturer at $0 for 6 cycles.    Gleostine (lomustine) will be shipped to [facility]. Dispensing pharmacy is [facility] (tele: [phone]; fax: [phone]).   Per approval letter, the dispensing pharmacy will notify the office when the prescription is ready to ship. The typical turnaround time is 3-5 business days.   ADDENDUM 07/20/2021 Spoke with representative from [place] confirming prescription is on file. Prescription will be mailed 07/23/21 with delivery to the cancer center on 07/24/21.  Oral Oncology Clinic will continue to follow.  Leron Croak, PharmD, BCPS Hematology/Oncology Clinical Pharmacist [facility] [place] [phone] 07/20/2021 11:12 AM

## 2021-07-25 NOTE — Telephone Encounter (Signed)
Oral Chemotherapy Pharmacist Encounter  I spoke with patient's wife, Hunter Terry with use of pacific interpreters [place] (ID: [id number]) for overview of: lomustine for the treatment of glioblastoma multiforme in conjunction with bevacizumab, planned duration until disease progression or unacceptable toxicity.  Counseled on administration, dosing, side effects, monitoring, drug-food interactions, safe handling, storage, and disposal.  Patient will take one lomustine 100mg  capsule, one 40 mg capsule, and two 10 mg capsules (160mg  total dose) by mouth once daily, may take at bedtime and on an empty stomach to decrease nausea and vomiting.  Patient will take Zofran 8mg  tablet, 1 tablet by mouth 30-60 min prior to lomustine dose to help decrease N/V.  Lomustine will be administered once every 6 weeks.  Lomustine start date: 07/26/21   Adverse effects include but are not limited to: nausea, vomiting, stomatitis, alopecia, decreased blood counts, alopecia, hepatotoxicity, and pulmonary toxicity.  Reviewed importance of keeping a medication schedule and plan for any missed doses. No barriers to medication adherence identified.  Medication reconciliation performed and medication/allergy list updated.  Patient has been approved to receive lomustine through [employer] for 6 cycles. After 6 cycles we can re-apply for assistance if needed. Subsequent refills for lomustine will need to be sent to [facility]. I explained to the patient's wife that the lomustine will be delivered every 6 weeks to the [facility]. She expressed appreciation and understanding.   All questions answered.  Hunter Terry voiced understanding and appreciation.   Medication education handout will be provided to patient in clinic tomorrow as well as medication that was shipped. Patient's family knows to call the office with questions or concerns. Oral Chemotherapy Clinic phone number provided.   Leron Croak,  PharmD, BCPS Hematology/Oncology Clinical Pharmacist [facility] [place] [phone] 07/25/2021 10:35 AM

## 2021-07-26 ENCOUNTER — Other Ambulatory Visit: Payer: Self-pay

## 2021-07-26 ENCOUNTER — Inpatient Hospital Stay: Payer: Medicaid Other

## 2021-07-26 ENCOUNTER — Inpatient Hospital Stay (HOSPITAL_BASED_OUTPATIENT_CLINIC_OR_DEPARTMENT_OTHER): Payer: Medicaid Other | Admitting: Internal Medicine

## 2021-07-26 ENCOUNTER — Other Ambulatory Visit: Payer: Medicaid Other

## 2021-07-26 ENCOUNTER — Ambulatory Visit: Payer: Medicaid Other

## 2021-07-26 VITALS — BP 117/64 | HR 70 | Temp 97.5°F | Resp 20 | Wt 144.9 lb

## 2021-07-26 DIAGNOSIS — Z5111 Encounter for antineoplastic chemotherapy: Secondary | ICD-10-CM | POA: Diagnosis not present

## 2021-07-26 DIAGNOSIS — C719 Malignant neoplasm of brain, unspecified: Secondary | ICD-10-CM

## 2021-07-26 LAB — CMP (CANCER CENTER ONLY)
ALT: 11 U/L (ref 0–44)
AST: 15 U/L (ref 15–41)
Albumin: 4.1 g/dL (ref 3.5–5.0)
Alkaline Phosphatase: 61 U/L (ref 38–126)
Anion gap: 9 (ref 5–15)
BUN: 6 mg/dL (ref 6–20)
CO2: 28 mmol/L (ref 22–32)
Calcium: 9.2 mg/dL (ref 8.9–10.3)
Chloride: 104 mmol/L (ref 98–111)
Creatinine: 0.72 mg/dL (ref 0.61–1.24)
GFR, Estimated: >60 mL/min (ref >60)
Glucose, Bld: 114 mg/dL — ABNORMAL HIGH (ref 70–99)
Potassium: 3.8 mmol/L (ref 3.5–5.1)
Sodium: 141 mmol/L (ref 135–145)
Total Bilirubin: 0.5 mg/dL (ref 0.3–1.2)
Total Protein: 7.3 g/dL (ref 6.5–8.1)

## 2021-07-26 LAB — CBC WITH DIFFERENTIAL (CANCER CENTER ONLY)
Abs Immature Granulocytes: 0.01 10*3/uL (ref 0.00–0.07)
Basophils Absolute: 0 10*3/uL (ref 0.0–0.1)
Basophils Relative: 1 %
Eosinophils Absolute: 0.1 10*3/uL (ref 0.0–0.5)
Eosinophils Relative: 2 %
HCT: 43.1 % (ref 39.0–52.0)
Hemoglobin: 14.9 g/dL (ref 13.0–17.0)
Immature Granulocytes: 0 %
Lymphocytes Relative: 22 %
Lymphs Abs: 0.9 10*3/uL (ref 0.7–4.0)
MCH: 29.5 pg (ref 26.0–34.0)
MCHC: 34.6 g/dL (ref 30.0–36.0)
MCV: 85.3 fL (ref 80.0–100.0)
Monocytes Absolute: 0.5 10*3/uL (ref 0.1–1.0)
Monocytes Relative: 11 %
Neutro Abs: 2.7 10*3/uL (ref 1.7–7.7)
Neutrophils Relative %: 64 %
Platelet Count: 246 10*3/uL (ref 150–400)
RBC: 5.05 MIL/uL (ref 4.22–5.81)
RDW: 13.7 % (ref 11.5–15.5)
WBC Count: 4.2 10*3/uL (ref 4.0–10.5)
nRBC: 0 % (ref 0.0–0.2)

## 2021-07-26 LAB — TOTAL PROTEIN, URINE DIPSTICK: Protein, ur: NEGATIVE mg/dL

## 2021-07-26 MED ORDER — SODIUM CHLORIDE 0.9 % IV SOLN: Freq: Once | INTRAVENOUS | Status: AC

## 2021-07-26 MED ORDER — SODIUM CHLORIDE 0.9 % IV SOLN
10.0000 mg/kg | Freq: Once | INTRAVENOUS | Status: AC
  Administered 2021-07-26: 700 mg via INTRAVENOUS
  Filled 2021-07-26: qty 12

## 2021-07-26 NOTE — Patient Instructions (Addendum)
[facility]  Discharge Instructions: Thank you for choosing [facility] to provide your oncology and hematology care.   If you have a lab appointment with the [facility], please go directly to the [facility] and check in at the registration area.   Wear comfortable clothing and clothing appropriate for easy access to any Portacath or PICC line.   We strive to give you quality time with your provider. You may need to reschedule your appointment if you arrive late (15 or more minutes).  Arriving late affects you and other patients whose appointments are after yours.  Also, if you miss three or more appointments without notifying the office, you may be dismissed from the clinic at the providers discretion.      For prescription refill requests, have your pharmacy contact our office and allow 72 hours for refills to be completed.    Today you received the following chemotherapy and/or immunotherapy agents: Bevacizumab      To help prevent nausea and vomiting after your treatment, we encourage you to take your nausea medication as directed.  BELOW ARE SYMPTOMS THAT SHOULD BE REPORTED IMMEDIATELY: *FEVER GREATER THAN 100.4 F (38 C) OR HIGHER *CHILLS OR SWEATING *NAUSEA AND VOMITING THAT IS NOT CONTROLLED WITH YOUR NAUSEA MEDICATION *UNUSUAL SHORTNESS OF BREATH *UNUSUAL BRUISING OR BLEEDING *URINARY PROBLEMS (pain or burning when urinating, or frequent urination) *BOWEL PROBLEMS (unusual diarrhea, constipation, pain near the anus) TENDERNESS IN MOUTH AND THROAT WITH OR WITHOUT PRESENCE OF ULCERS (sore throat, sores in mouth, or a toothache) UNUSUAL RASH, SWELLING OR PAIN  UNUSUAL VAGINAL DISCHARGE OR ITCHING   Items with * indicate a potential emergency and should be followed up as soon as possible or go to the Emergency Department if any problems should occur.  Please show the CHEMOTHERAPY ALERT CARD or IMMUNOTHERAPY ALERT CARD at check-in  to the Emergency Department and triage nurse.  Should you have questions after your visit or need to cancel or reschedule your appointment, please contact [facility]  Dept: [phone]  and follow the prompts.  Office hours are 8:00 a.m. to 4:30 p.m. Monday - Friday. Please note that voicemails left after 4:00 p.m. may not be returned until the following business day.  We are closed weekends and major holidays. You have access to a nurse at all times for urgent questions. Please call the main number to the clinic Dept: [phone] and follow the prompts.   For any non-urgent questions, you may also contact your provider using MyChart. We now offer e-Visits for anyone 53 and older to request care online for non-urgent symptoms. For details visit [email or web address].   Also download the MyChart app! Go to the app store, search "MyChart", open the app, select [facility], and log in with your MyChart username and password.  Due to Covid, a mask is required upon entering the hospital/clinic. If you do not have a mask, one will be given to you upon arrival. For doctor visits, patients may have 1 support person aged 38 or older with them. For treatment visits, patients cannot have anyone with them due to current Covid guidelines and our immunocompromised population.   Bevacizumab injection Qu es este medicamento? El BEVACIZUMAB es un anticuerpo monoclonal. [place] para tratar muchos tipos de cncer. Este medicamento puede ser utilizado para otros usos; si tiene alguna pregunta consulte con su proveedor de atencin mdica o con su farmacutico. MARCAS COMUNES: Alymsys, Avastin, MVASI, Noah Charon  Qu le debo informar a mi profesional de la salud antes de tomar este medicamento? Necesitan saber si usted presenta alguno de los [employer] o situaciones: diabetes enfermedad cardiaca presin sangunea alta antecedentes de tos con sangre quimioterapia previa con  antraciclinas (p. ej.: doxorubicina, daunorubicina, epirubicina) terapia de radiacin en curso o reciente ciruga reciente o planes para realizarse una ciruga accidente cerebrovascular una reaccin alrgica o inusual al bevacizumab, a las protenas de Production designer, theatre/television/film, a las protenas de ratn, a otros medicamentos, alimentos, Scientist, water quality o conservantes si est embarazada o buscando quedar embarazada si est amamantando a un beb Cmo debo [employer]? Este medicamento se administra mediante infusin en una vena. Lo administra un profesional de Technical sales engineer en un hospital o en un entorno clnico. Hable con su pediatra para informarse acerca del uso de este medicamento en nios. Puede requerir atencin especial. Sobredosis: Pngase en contacto inmediatamente con un centro toxicolgico o una sala de urgencia si usted cree que haya tomado demasiado medicamento. ATENCIN: [employer] es solo para usted. No comparta este medicamento con nadie. Qu sucede si me olvido de una dosis? Es importante no olvidar ninguna dosis. Informe a su mdico o a su profesional de la salud si no puede asistir a Photographer. Qu puede interactuar con este medicamento? No se anticipan interacciones. Puede ser que esta lista no menciona todas las posibles interacciones. Informe a su profesional de [employer]	[place] de [employer] productos a base de hierbas, medicamentos de [place] o suplementos nutritivos que est tomando. Si usted fuma, consume bebidas alcohlicas o si utiliza drogas ilegales, indqueselo tambin a su profesional de KB Home	[place]. Algunas sustancias pueden interactuar con su medicamento. A qu debo estar atento al usar Coca[employer]a? Se supervisar su estado de salud atentamente mientras reciba este medicamento. Tendr que hacerse anlisis de sangre importantes y [employer] de [place] mientras est tomando este medicamento. Este medicamento podra aumentar el riesgo de moretones o sangrado. Consulte a su mdico  o a su profesional de la salud si observa sangrados inusuales. Antes de realizarse una ciruga, hable con su proveedor de atencin mdica para asegurarse de que no hay ningn problema. Este frmaco puede aumentar el riesgo de que el sitio o la herida quirrgica no sanen correctamente. Deber dejar de usar este frmaco durante [address] antes de la [place]. Despus de la ciruga, espere al menos [address] antes de reiniciar el uso de este frmaco. Asegrese de que el sitio o la herida quirrgica haya sanado lo suficiente antes de reiniciar el uso del frmaco. Hable con su proveedor de atencin mdica si tiene alguna pregunta. No debe quedar embarazada mientras est usando este medicamento o por 6 meses despus de dejar de usarlo. Las mujeres deben informar a su mdico si estn buscando quedar embarazadas o si creen que podran estar embarazadas. Existe la posibilidad de efectos secundarios graves en un beb sin nacer. Para obtener ms informacin, hable con su profesional de la salud o su farmacutico. No debe Economist a un beb mientras est usando este medicamento y durante 6 meses despus de la ltima dosis. Este medicamento ha causado insuficiencia ovrica en [employer]. Este medicamento puede interferir con la capacidad de tener hijos. Usted debe hablar con su mdico o su profesional de la salud si est preocupado por su fertilidad. Qu efectos secundarios puedo tener al [employer] este medicamento? Efectos secundarios que debe informar a su mdico o a Barrister's clerk de la salud tan pronto como sea posible: Chief of Staff, tales  como erupcin cutnea, comezn/picazn o urticaria, e hinchazn de la cara, los labios o la Holiday representative u opresin en el pecho escalofros tos con sangre fiebre alta convulsiones estreimiento grave signos y sntomas de sangrado, tales como heces con sangre o de color negro y aspecto alquitranado; [place] de color rojo o marrn oscuro; escupir sangre o material marrn  que tiene el aspecto de posos (residuos) de caf; Tree surgeon rojas en la piel; sangrado o moretones inusuales en los ojos, las encas o la nariz signos y sntomas de un cogulo sanguneo, tales como problemas respiratorios; Social research officer, government en el pecho; dolor de cabeza grave, repentino; dolor, hinchazn, calor en la pierna signos y sntomas de un accidente cerebrovascular, tales como cambios en la visin; confusin; dificultad para hablar o entender; dolores de cabeza intensos; entumecimiento o debilidad repentina de la cara, el brazo o la pierna; problemas al Writer; Chief of Staff; prdida de equilibrio o coordinacin dolor estomacal sudoracin hinchazn de piernas o tobillos vmito aumento de peso Efectos secundarios que generalmente no requieren atencin mdica (infrmelos a su mdico o a Barrister's clerk de la salud si persisten o si son molestos): dolor de espalda cambios en el sentido del gusto disminucin del apetito piel seca nuseas cansancio Puede ser que esta lista no menciona todos los posibles efectos secundarios. Comunquese a su mdico por asesoramiento mdico [employer]. Usted puede informar los efectos secundarios a la FDA por telfono al 1-800-FDA-1088. Dnde debo guardar mi medicina? Este medicamento se administra en hospitales o clnicas, y no necesitar guardarlo en su domicilio. ATENCIN: Este folleto es un resumen. Puede ser que no cubra toda la posible informacin. Si usted tiene preguntas acerca de esta medicina, consulte con su mdico, su farmacutico o su profesional de Technical sales engineer.  2022 Elsevier/Gold Standard (2021-02-28 00:00:00) Bevacizumab injection What is this medication? BEVACIZUMAB (be va SIZ yoo mab) is a monoclonal antibody. It is used to treat many types of cancer. This medicine may be used for other purposes; ask your health care provider or pharmacist if you have questions. COMMON BRAND NAME(S): Alymsys, Avastin, MVASI, Noah Charon What should I tell my care  team before I take this medication? They need to know if you have any of these conditions: diabetes heart disease high blood pressure history of coughing up blood prior anthracycline chemotherapy (e.g., doxorubicin, daunorubicin, epirubicin) recent or ongoing radiation therapy recent or planning to have surgery stroke an unusual or allergic reaction to bevacizumab, hamster proteins, mouse proteins, other medicines, foods, dyes, or preservatives pregnant or trying to get pregnant breast-feeding How should I use this medication? This medicine is for infusion into a vein. It is given by a health care professional in a hospital or clinic setting. Talk to your pediatrician regarding the use of this medicine in children. Special care may be needed. Overdosage: If you think you have taken too much of this medicine contact a poison control center or emergency room at once. NOTE: This medicine is only for you. Do not share this medicine with others. What if I miss a dose? It is important not to miss your dose. Call your doctor or health care professional if you are unable to keep an appointment. What may interact with this medication? Interactions are not expected. This list may not describe all possible interactions. Give your health care provider a list of all the medicines, herbs, non-prescription drugs, or dietary supplements you use. Also tell them if you smoke, drink alcohol, or use illegal drugs. Some items may interact with  your medicine. What should I watch for while using this medication? Your condition will be monitored carefully while you are receiving this medicine. You will need important blood work and urine testing done while you are taking this medicine. This medicine may increase your risk to bruise or bleed. Call your doctor or health care professional if you notice any unusual bleeding. Before having surgery, talk to your health care provider to make sure it is ok. This drug can  increase the risk of poor healing of your surgical site or wound. You will need to stop this drug for 28 days before surgery. After surgery, wait at least 28 days before restarting this drug. Make sure the surgical site or wound is healed enough before restarting this drug. Talk to your health care provider if questions. Do not become pregnant while taking this medicine or for 6 months after stopping it. Women should inform their doctor if they wish to become pregnant or think they might be pregnant. There is a potential for serious side effects to an unborn child. Talk to your health care professional or pharmacist for more information. Do not breast-feed an infant while taking this medicine and for 6 months after the last dose. This medicine has caused ovarian failure in some women. This medicine may interfere with the ability to have a child. You should talk to your doctor or health care professional if you are concerned about your fertility. What side effects may I notice from receiving this medication? Side effects that you should report to your doctor or health care professional as soon as possible: allergic reactions like skin rash, itching or hives, swelling of the face, lips, or tongue chest pain or chest tightness chills coughing up blood high fever seizures severe constipation signs and symptoms of bleeding such as bloody or black, tarry stools; red or dark-brown urine; spitting up blood or brown material that looks like coffee grounds; red spots on the skin; unusual bruising or bleeding from the eye, gums, or nose signs and symptoms of a blood clot such as breathing problems; chest pain; severe, sudden headache; pain, swelling, warmth in the leg signs and symptoms of a stroke like changes in vision; confusion; trouble speaking or understanding; severe headaches; sudden numbness or weakness of the face, arm or leg; trouble walking; dizziness; loss of balance or coordination stomach  pain sweating swelling of legs or ankles vomiting weight gain Side effects that usually do not require medical attention (report to your doctor or health care professional if they continue or are bothersome): back pain changes in taste decreased appetite dry skin nausea tiredness This list may not describe all possible side effects. Call your doctor for medical advice about side effects. You may report side effects to FDA at 1-800-FDA-1088. Where should I keep my medication? This drug is given in a hospital or clinic and will not be stored at home. NOTE: This sheet is a summary. It may not cover all possible information. If you have questions about this medicine, talk to your doctor, pharmacist, or health care provider.  2022 Elsevier/Gold Standard (2021-04-17 00:00:00)

## 2021-07-26 NOTE — Progress Notes (Signed)
[facility] [place], [place] [address] [phone]   Interval Evaluation  Date of Service: 07/26/21 [name] Patient MRN: [id number] Patient DOB: Dec 22, 1960 Provider: Ventura Sellers, MD  Identifying Statement:  Hunter Terry is a 60 y.o. 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  Path demonstrates glioblastoma IDH-1 wt   08/02/2020 Surgery   Debulking craniotomy with Dr. [name]   09/05/2020 - 10/17/2020 Radiation Therapy   IMRT with concurrent Temozolomide [name])   11/19/2020 -  Chemotherapy   Initiates cycle #1 adjuvant Temozolomide       07/17/2021 -  Chemotherapy   Patient is on Treatment Plan : BRAIN Lomustine q42d     07/26/2021 -  Chemotherapy   Patient is on Treatment Plan : BRAIN GBM Bevacizumab 14d x 6 cycles       Biomarkers:  MGMT Unknown.  IDH 1/2 Wild type.  EGFR Unknown  TERT "Mutated   Interval History:  Hunter Terry presents today for first avastin infusion, chemo pre-treat.  No new or progressive deficits today, fatigue level is stable. No balance issues, motor dysfunction.  Remains off decadron.  Continues on Keppra.   H+P (07/24/20) Patient presented to medical attention in late October, 2021 with new onset seizure.  Event was decribed as loss of consciousness, sudden, without clarity on further details aside from altered mental status upon awakening.  CNS imaging demonsrated non-enhancing mass within left temporal lobe c/w likely glioma; he underwent stereotactic biopsy with Dr. [name] on 06/15/20.  There was significant delay in finalizing path, explaining delay in follow up and evaluation.  He denies any seizures since discharge from hospital, on 4 anti-seizure drugs.  He does complain of dizziness and drowsiness with the medications, however.   Also describes impaired short term memory.  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    Medications: Current Outpatient Medications on File Prior to Visit  Medication Sig Dispense Refill   Carboxymethylcellul-Glycerin (LUBRICATING EYE DROPS OP) Place 1 drop into both eyes daily as needed (dry eyes).     dexamethasone (DECADRON) 4 MG tablet Take 1 tablet (4 mg total) by mouth 2 (two) times daily with a meal. 30 tablet 1   levETIRAcetam (KEPPRA) 750 MG tablet Take 1 tablet (750 mg total) by mouth 2 (two) times daily. 60 tablet 0   ondansetron (ZOFRAN) 8 MG tablet Take 1 tablet (8 mg total) by mouth 2 (two) times daily as needed. Start on the third day after chemotherapy. 30 tablet 1   oxymetazoline (AFRIN) 0.05 % nasal spray Place 1 spray into both nostrils 2 (two) times daily as needed for congestion.     [DISCONTINUED] lacosamide (VIMPAT) 200 MG TABS tablet Take 0.5 tablets (100 mg total) by mouth 2 (two) times daily. 30 tablet 0   Current Facility-Administered Medications on File Prior to Visit  Medication Dose Route Frequency Provider Last Rate Last Admin   bevacizumab-bvzr (ZIRABEV) 700 mg in sodium chloride 0.9 % 100 mL chemo infusion  10 mg/kg (Treatment Plan Recorded) Intravenous Once Ventura Sellers, MD 384 mL/hr at 07/26/21 1544 700 mg at 07/26/21 1544    Allergies: No Known Allergies Past Medical History:  Past Medical History:  Diagnosis Date   Cancer ([place])    BRAIN TUMOR   Headache    Seizure ([place])  Past Surgical History:  Past Surgical History:  Procedure Laterality Date   APPLICATION OF CRANIAL NAVIGATION N/A 06/15/2020   Procedure: APPLICATION OF CRANIAL NAVIGATION;  Surgeon: [name], MD;  Location: [facility];  Service: Neurosurgery;  Laterality: N/A;   APPLICATION OF CRANIAL NAVIGATION N/A 08/02/2020   Procedure: APPLICATION OF CRANIAL NAVIGATION;  Surgeon: [name], MD;  Location: [facility];  Service: Neurosurgery;  Laterality: N/A;    CRANIOTOMY N/A 08/02/2020   Procedure: CRANIOTOMY LEFT TEMPORAL LOBECTOMY FOR TUMOR EXCISION;  Surgeon: [name], MD;  Location: [facility];  Service: Neurosurgery;  Laterality: N/A;   FRAMELESS  BIOPSY WITH BRAINLAB Left 06/15/2020   Procedure: Left temporal lobe stereotactic brain biopsy with brainlab;  Surgeon: [name], MD;  Location: [facility];  Service: Neurosurgery;  Laterality: Left;   Social History:  Social History   Socioeconomic History   Marital status: Married    Spouse name: Not on file   Number of children: Not on file   Years of education: Not on file   Highest education level: Not on file  Occupational History   Not on file  Tobacco Use   Smoking status: Never   Smokeless tobacco: Never  Substance and Sexual Activity   Alcohol use: Never   Drug use: Not on file   Sexual activity: Yes    Partners: Female  Other Topics Concern   Not on file  Social History Narrative   Not on file   Social Determinants of Health   Financial Resource Strain: Not on file  Food Insecurity: Not on file  Transportation Needs: Not on file  Physical Activity: Not on file  Stress: Not on file  Social Connections: Not on file  Intimate Partner Violence: Not on file   Family History:  Family History  Problem Relation Age of Onset   Cancer Neg Hx     Review of Systems: Constitutional: Doesn't report fevers, chills or abnormal weight loss Eyes: Doesn't report blurriness of vision Ears, nose, mouth, throat, and face: Doesn't report sore throat Respiratory: Doesn't report cough, dyspnea or wheezes Cardiovascular: Doesn't report palpitation, chest discomfort  Gastrointestinal:  Doesn't report nausea, constipation, diarrhea GU: Doesn't report incontinence Skin: Doesn't report skin rashes Neurological: Per HPI Musculoskeletal: Doesn't report joint pain Behavioral/Psych: Doesn't report anxiety  Physical Exam: Vitals:   07/26/21 1420  BP: 117/64  Pulse: 70  Resp:  20  Temp: (!) 97.5 F (36.4 C)  SpO2: 99%     KPS: 90. General: Alert, cooperative, pleasant, in no acute distress Head: Normal EENT: No conjunctival injection or scleral icterus.  Lungs: Resp effort normal Cardiac: Regular rate Abdomen: Non-distended abdomen Skin: No rashes cyanosis or petechiae. Extremities: No clubbing or edema  Neurologic Exam: Mental Status: Awake, alert, attentive to examiner. Oriented to self and environment. Language is fluent with intact comprehension.  Cranial Nerves: Visual acuity is grossly normal. Visual fields are full. Extra-ocular movements intact. No ptosis. Face is symmetric Motor: Tone and bulk are normal. Power is full in both arms and legs. Reflexes are symmetric, no pathologic reflexes present.  Sensory: Intact to light touch Gait: Normal.   Labs: I have reviewed the data as listed    Component Value Date/Time   NA 141 07/26/2021 1400   K 3.8 07/26/2021 1400   CL 104 07/26/2021 1400   CO2 28 07/26/2021 1400   GLUCOSE 114 (H) 07/26/2021 1400   BUN 6 07/26/2021 1400   CREATININE 0.72 07/26/2021 1400  CALCIUM 9.2 07/26/2021 1400   PROT 7.3 07/26/2021 1400   ALBUMIN 4.1 07/26/2021 1400   AST 15 07/26/2021 1400   ALT 11 07/26/2021 1400   ALKPHOS 61 07/26/2021 1400   BILITOT 0.5 07/26/2021 1400   GFRNONAA >60 07/26/2021 1400   Lab Results  Component Value Date   WBC 4.2 07/26/2021   NEUTROABS 2.7 07/26/2021   HGB 14.9 07/26/2021   HCT 43.1 07/26/2021   MCV 85.3 07/26/2021   PLT 246 07/26/2021    Assessment/Plan Glioblastoma, IDH-wildtype ([place]) [C71.9]  Jemery Parcel is clinically stable today. Labs are within normal limits.   We recommended initiation of second line therapy with CCNU 49m/m2 PO q6 weeks, and Avastin 180mkg IV q2 weeks.  We reviewed side effects of CCNU, fatigue, nausea/vomiting, cytopenias; and Avastin, hypertension, bleeding/clotting, wound healing impairment.    He is safe to begin cycle #1 of  CCNU/avastin today.  Chemotherapy should be held for the following:  ANC less than 1,000  Platelets less than 100,000  LFT or creatinine greater than 2x ULN  If clinical concerns/contraindications develop  Avastin should be held for the following:  ANC less than 500  Platelets less than 50,000  LFT or creatinine greater than 2x ULN  If clinical concerns/contraindications develop  Keppra will stay at 75034mID.  We ask that EucSaquan Furtickturn to clinic in 2 weeks for next avastin treatment.  Next MRI will be scheduled for 08/31/21 following 1st 6 week cycle.  We appreciate the opportunity to participate in the care of [place]  All questions were answered. The patient knows to call the clinic with any problems, questions or concerns. No barriers to learning were detected.  The total time spent in the encounter was 30 minutes and more than 50% was on counseling and review of test results   ZacVentura SellersD Medical Director of Neuro-Oncology [facility] [place]/15/22 3:49 PM

## 2021-07-27 ENCOUNTER — Telehealth: Payer: Self-pay | Admitting: Internal Medicine

## 2021-07-27 ENCOUNTER — Telehealth: Payer: Self-pay | Admitting: *Deleted

## 2021-07-27 NOTE — Telephone Encounter (Signed)
-----   Message from Charleston Poot, RN sent at 07/26/2021  4:21 PM EST ----- Regarding: First Time/ Bevacizumab/ Dr Mickeal Skinner patient First time bevacizumab, tolerated well.   Thank you, Libbie K

## 2021-07-27 NOTE — Telephone Encounter (Signed)
Called & spoke with pt's wife through [employer] she states pt is doing well without c/o's.  Denies any problems & knows how to reach [place] if needed.

## 2021-07-27 NOTE — Telephone Encounter (Signed)
Scheduled per 12/15 los, pt has been called and wife confirmed appt via interpreter

## 2021-08-09 ENCOUNTER — Inpatient Hospital Stay (HOSPITAL_BASED_OUTPATIENT_CLINIC_OR_DEPARTMENT_OTHER): Payer: Medicaid Other | Admitting: Internal Medicine

## 2021-08-09 ENCOUNTER — Inpatient Hospital Stay: Payer: Medicaid Other

## 2021-08-09 ENCOUNTER — Other Ambulatory Visit: Payer: Self-pay

## 2021-08-09 VITALS — BP 124/84 | HR 64 | Temp 98.5°F | Resp 15 | Wt 147.1 lb

## 2021-08-09 DIAGNOSIS — Z5111 Encounter for antineoplastic chemotherapy: Secondary | ICD-10-CM | POA: Diagnosis not present

## 2021-08-09 DIAGNOSIS — C719 Malignant neoplasm of brain, unspecified: Secondary | ICD-10-CM | POA: Diagnosis not present

## 2021-08-09 DIAGNOSIS — R569 Unspecified convulsions: Secondary | ICD-10-CM | POA: Diagnosis not present

## 2021-08-09 LAB — CBC WITH DIFFERENTIAL (CANCER CENTER ONLY)
Abs Immature Granulocytes: 0 10*3/uL (ref 0.00–0.07)
Basophils Absolute: 0 10*3/uL (ref 0.0–0.1)
Basophils Relative: 1 %
Eosinophils Absolute: 0.1 10*3/uL (ref 0.0–0.5)
Eosinophils Relative: 3 %
HCT: 42 % (ref 39.0–52.0)
Hemoglobin: 14.3 g/dL (ref 13.0–17.0)
Immature Granulocytes: 0 %
Lymphocytes Relative: 27 %
Lymphs Abs: 0.9 10*3/uL (ref 0.7–4.0)
MCH: 29.2 pg (ref 26.0–34.0)
MCHC: 34 g/dL (ref 30.0–36.0)
MCV: 85.7 fL (ref 80.0–100.0)
Monocytes Absolute: 0.5 10*3/uL (ref 0.1–1.0)
Monocytes Relative: 14 %
Neutro Abs: 2 10*3/uL (ref 1.7–7.7)
Neutrophils Relative %: 55 %
Platelet Count: 248 10*3/uL (ref 150–400)
RBC: 4.9 MIL/uL (ref 4.22–5.81)
RDW: 13.5 % (ref 11.5–15.5)
WBC Count: 3.6 10*3/uL — ABNORMAL LOW (ref 4.0–10.5)
nRBC: 0 % (ref 0.0–0.2)

## 2021-08-09 LAB — CMP (CANCER CENTER ONLY)
ALT: 13 U/L (ref 0–44)
AST: 16 U/L (ref 15–41)
Albumin: 4.2 g/dL (ref 3.5–5.0)
Alkaline Phosphatase: 44 U/L (ref 38–126)
Anion gap: 7 (ref 5–15)
BUN: 7 mg/dL (ref 6–20)
CO2: 30 mmol/L (ref 22–32)
Calcium: 9.4 mg/dL (ref 8.9–10.3)
Chloride: 101 mmol/L (ref 98–111)
Creatinine: 0.7 mg/dL (ref 0.61–1.24)
GFR, Estimated: >60 mL/min (ref >60)
Glucose, Bld: 92 mg/dL (ref 70–99)
Potassium: 3.8 mmol/L (ref 3.5–5.1)
Sodium: 138 mmol/L (ref 135–145)
Total Bilirubin: 0.7 mg/dL (ref 0.3–1.2)
Total Protein: 7 g/dL (ref 6.5–8.1)

## 2021-08-09 LAB — TOTAL PROTEIN, URINE DIPSTICK: Protein, ur: NEGATIVE mg/dL

## 2021-08-09 MED ORDER — SODIUM CHLORIDE 0.9 % IV SOLN: Freq: Once | INTRAVENOUS | Status: AC

## 2021-08-09 MED ORDER — SODIUM CHLORIDE 0.9 % IV SOLN
10.0000 mg/kg | Freq: Once | INTRAVENOUS | Status: AC
  Administered 2021-08-09: 11:00:00 700 mg via INTRAVENOUS
  Filled 2021-08-09: qty 12

## 2021-08-09 NOTE — Progress Notes (Signed)
[facility] [place], [place] [address] [phone]   Interval Evaluation  Date of Service: 08/09/21 [name] Patient MRN: [id number] Patient DOB: 05/22/1961 Provider: Ventura Sellers, MD  Identifying Statement:  Hunter Terry is a 60 y.o. male with left temporal glioblastoma   Oncologic History: Oncology History  Glioblastoma, IDH-wildtype ([place])  06/15/2020 Surgery   Stereotactic biopsy, L temporal [name]).  Path demonstrates glioblastoma IDH-1 wt   08/02/2020 Surgery   Debulking craniotomy with Dr. [name]   09/05/2020 - 10/17/2020 Radiation Therapy   IMRT with concurrent Temozolomide [name])   11/19/2020 -  Chemotherapy   Initiates cycle #1 adjuvant Temozolomide       07/17/2021 -  Chemotherapy   Patient is on Treatment Plan : BRAIN Lomustine q42d     07/26/2021 -  Chemotherapy   Patient is on Treatment Plan : BRAIN GBM Bevacizumab 14d x 6 cycles       Biomarkers:  MGMT Unknown.  IDH 1/2 Wild type.  EGFR Unknown  TERT "Mutated   Interval History:  Christ Fullenwider presents today for avastin infusion, now 2 weeks into CCNU cycle #1.  No new complaints today, fatigue level is stable. No balance issues, motor dysfunction.  Remains off decadron.  Continues on Keppra.   H+P (07/24/20) Patient presented to medical attention in late October, 2021 with new onset seizure.  Event was decribed as loss of consciousness, sudden, without clarity on further details aside from altered mental status upon awakening.  CNS imaging demonsrated non-enhancing mass within left temporal lobe c/w likely glioma; he underwent stereotactic biopsy with Dr. [name] on 06/15/20.  There was significant delay in finalizing path, explaining delay in follow up and evaluation.  He denies any seizures since discharge from hospital, on 4 anti-seizure drugs.  He does complain of dizziness and drowsiness with the medications, however.   Also describes impaired short term memory.  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    Medications: Current Outpatient Medications on File Prior to Visit  Medication Sig Dispense Refill   Carboxymethylcellul-Glycerin (LUBRICATING EYE DROPS OP) Place 1 drop into both eyes daily as needed (dry eyes).     dexamethasone (DECADRON) 4 MG tablet Take 1 tablet (4 mg total) by mouth 2 (two) times daily with a meal. 30 tablet 1   levETIRAcetam (KEPPRA) 750 MG tablet Take 1 tablet (750 mg total) by mouth 2 (two) times daily. 60 tablet 0   ondansetron (ZOFRAN) 8 MG tablet Take 1 tablet (8 mg total) by mouth 2 (two) times daily as needed. Start on the third day after chemotherapy. 30 tablet 1   oxymetazoline (AFRIN) 0.05 % nasal spray Place 1 spray into both nostrils 2 (two) times daily as needed for congestion.     [DISCONTINUED] lacosamide (VIMPAT) 200 MG TABS tablet Take 0.5 tablets (100 mg total) by mouth 2 (two) times daily. 30 tablet 0   No current facility-administered medications on file prior to visit.    Allergies: No Known Allergies Past Medical History:  Past Medical History:  Diagnosis Date   Cancer ([place])    BRAIN TUMOR   Headache    Seizure [facility])    Past Surgical History:  Past Surgical History:  Procedure Laterality Date   APPLICATION OF CRANIAL NAVIGATION N/A 06/15/2020   Procedure: APPLICATION OF CRANIAL NAVIGATION;  Surgeon: [name], MD;  Location: [facility];  Service: Neurosurgery;  Laterality: N/A;  APPLICATION OF CRANIAL NAVIGATION N/A 08/02/2020   Procedure: APPLICATION OF CRANIAL NAVIGATION;  Surgeon: [name], MD;  Location: [facility];  Service: Neurosurgery;  Laterality: N/A;   CRANIOTOMY N/A 08/02/2020   Procedure: CRANIOTOMY LEFT TEMPORAL LOBECTOMY FOR TUMOR EXCISION;  Surgeon: [name], MD;  Location: [facility];  Service: Neurosurgery;  Laterality: N/A;   FRAMELESS  BIOPSY WITH BRAINLAB Left 06/15/2020   Procedure: Left temporal lobe  stereotactic brain biopsy with brainlab;  Surgeon: [name], MD;  Location: [facility];  Service: Neurosurgery;  Laterality: Left;   Social History:  Social History   Socioeconomic History   Marital status: Married    Spouse name: Not on file   Number of children: Not on file   Years of education: Not on file   Highest education level: Not on file  Occupational History   Not on file  Tobacco Use   Smoking status: Never   Smokeless tobacco: Never  Substance and Sexual Activity   Alcohol use: Never   Drug use: Not on file   Sexual activity: Yes    Partners: Female  Other Topics Concern   Not on file  Social History Narrative   Not on file   Social Determinants of Health   Financial Resource Strain: Not on file  Food Insecurity: Not on file  Transportation Needs: Not on file  Physical Activity: Not on file  Stress: Not on file  Social Connections: Not on file  Intimate Partner Violence: Not on file   Family History:  Family History  Problem Relation Age of Onset   Cancer Neg Hx     Review of Systems: Constitutional: Doesn't report fevers, chills or abnormal weight loss Eyes: Doesn't report blurriness of vision Ears, nose, mouth, throat, and face: Doesn't report sore throat Respiratory: Doesn't report cough, dyspnea or wheezes Cardiovascular: Doesn't report palpitation, chest discomfort  Gastrointestinal:  Doesn't report nausea, constipation, diarrhea GU: Doesn't report incontinence Skin: Doesn't report skin rashes Neurological: Per HPI Musculoskeletal: Doesn't report joint pain Behavioral/Psych: Doesn't report anxiety  Physical Exam: Vitals:   08/09/21 0915  BP: 124/84  Pulse: 64  Resp: 15  Temp: 98.5 F (36.9 C)  SpO2: 99%   KPS: 90. General: Alert, cooperative, pleasant, in no acute distress Head: Normal EENT: No conjunctival injection or scleral icterus.  Lungs: Resp effort normal Cardiac: Regular rate Abdomen: Non-distended abdomen Skin:  No rashes cyanosis or petechiae. Extremities: No clubbing or edema  Neurologic Exam: Mental Status: Awake, alert, attentive to examiner. Oriented to self and environment. Language is fluent with intact comprehension.  Cranial Nerves: Visual acuity is grossly normal. Visual fields are full. Extra-ocular movements intact. No ptosis. Face is symmetric Motor: Tone and bulk are normal. Power is full in both arms and legs. Reflexes are symmetric, no pathologic reflexes present.  Sensory: Intact to light touch Gait: Normal.   Labs: I have reviewed the data as listed    Component Value Date/Time   NA 141 07/26/2021 1400   K 3.8 07/26/2021 1400   CL 104 07/26/2021 1400   CO2 28 07/26/2021 1400   GLUCOSE 114 (H) 07/26/2021 1400   BUN 6 07/26/2021 1400   CREATININE 0.72 07/26/2021 1400   CALCIUM 9.2 07/26/2021 1400   PROT 7.3 07/26/2021 1400   ALBUMIN 4.1 07/26/2021 1400   AST 15 07/26/2021 1400   ALT 11 07/26/2021 1400   ALKPHOS 61 07/26/2021 1400   BILITOT 0.5 07/26/2021 1400   GFRNONAA >60 07/26/2021 1400  Lab Results  Component Value Date   WBC 4.2 07/26/2021   NEUTROABS 2.7 07/26/2021   HGB 14.9 07/26/2021   HCT 43.1 07/26/2021   MCV 85.3 07/26/2021   PLT 246 07/26/2021    Assessment/Plan Glioblastoma, IDH-wildtype ([place]) [C71.9]  Lash Normoyle is clinically stable today. Labs are within normal limits.  No new or progressive deficits.   We recommended continuation of second line therapy with CCNU $RemoveB'90mg'jaKTiBpb$ /m2 PO q6 weeks, now day 15/42, and Avastin $RemoveBef'10mg'VwVeDvumLv$ /kg IV q2 weeks.    Chemotherapy should be held for the following:  ANC less than 1,000  Platelets less than 100,000  LFT or creatinine greater than 2x ULN  If clinical concerns/contraindications develop  Avastin should be held for the following:  ANC less than 500  Platelets less than 50,000  LFT or creatinine greater than 2x ULN  If clinical concerns/contraindications develop  Keppra will stay at $Remov'750mg'ahZpUc$   BID.  We ask that Faolan Springfield return to clinic in 2 weeks for next avastin treatment.  Next MRI will be scheduled for 08/31/21 following 1st 6 week cycle.  We appreciate the opportunity to participate in the care of [place].    All questions were answered. The patient knows to call the clinic with any problems, questions or concerns. No barriers to learning were detected.  The total time spent in the encounter was 30 minutes and more than 50% was on counseling and review of test results   Ventura Sellers, MD Medical Director of Neuro-Oncology [facility] [place] 08/09/21 9:01 AM

## 2021-08-09 NOTE — Patient Instructions (Signed)
[facility] MEDICAL [facility]COLOG[facility]  Discharge Instructions: °Thank you for choosing [facility] to provide your oncology and hematology care.  ° °If you have a lab appointment with the Cancer Center, please go directly to the Cancer Center and check in at the registration area. °  °Wear comfortable clothing and clothing appropriate for easy access to any Portacath or PICC line.  ° °We strive to give you quality time with your provider. You may need to reschedule your appointment if you arrive late (15 or more minutes).  Arriving late affects you and other patients whose appointments are after yours.  Also, if you miss three or more appointments without notifying the office, you may be dismissed from the clinic at the provider’s discretion.    °  °For prescription refill requests, have your pharmacy contact our office and allow 72 hours for refills to be completed.   ° °Today you received the following chemotherapy and/or immunotherapy agents: Bevacizumab.     °  °To help prevent nausea and vomiting after your treatment, we encourage you to take your nausea medication as directed. ° °BELOW ARE SYMPTOMS THAT SHOULD BE REPORTED IMMEDIATELY: °*FEVER GREATER THAN 100.4 F (38 °C) OR HIGHER °*CHILLS OR SWEATING °*NAUSEA AND VOMITING THAT IS NOT CONTROLLED WITH YOUR NAUSEA MEDICATION °*UNUSUAL SHORTNESS OF BREATH °*UNUSUAL BRUISING OR BLEEDING °*URINARY PROBLEMS (pain or burning when urinating, or frequent urination) °*BOWEL PROBLEMS (unusual diarrhea, constipation, pain near the anus) °TENDERNESS IN MOUTH AND THROAT WITH OR WITHOUT PRESENCE OF ULCERS (sore throat, sores in mouth, or a toothache) °UNUSUAL RASH, SWELLING OR PAIN  °UNUSUAL VAGINAL DISCHARGE OR ITCHING  ° °Items with * indicate a potential emergency and should be followed up as soon as possible or go to the Emergency Department if any problems should occur. ° °Please show the CHEMOTHERAPY ALERT CARD or IMMUNOTHERAPY ALERT CARD at check-in  to the Emergency Department and triage nurse. ° °Should you have questions after your visit or need to cancel or reschedule your appointment, please contact [facility]  Dept: [phone]  and follow the prompts.  Office hours are 8:00 a.m. to 4:30 p.m. Monday - Friday. Please note that voicemails left after 4:00 p.m. may not be returned until the following business day.  We are closed weekends and major holidays. You have access to a nurse at all times for urgent questions. Please call the main number to the clinic Dept: [phone] and follow the prompts. ° ° °For any non-urgent questions, you may also contact your provider using MyChart. We now offer e-Visits for anyone 18 and older to request care online for non-urgent symptoms. For details visit [email or web address]. °  °Also download the MyChart app! Go to the app store, search "MyChart", open the app, select [place], and log in with your MyChart username and password. ° °Due to Covid, a mask is required upon entering the hospital/clinic. If you do not have a mask, one will be given to you upon arrival. For doctor visits, patients may have 1 support person aged 18 or older with them. For treatment visits, patients cannot have anyone with them due to current Covid guidelines and our immunocompromised population.  ° °

## 2021-08-21 ENCOUNTER — Other Ambulatory Visit: Payer: Self-pay | Admitting: Radiation Therapy

## 2021-08-23 ENCOUNTER — Other Ambulatory Visit: Payer: Self-pay

## 2021-08-23 ENCOUNTER — Inpatient Hospital Stay: Payer: Medicaid Other

## 2021-08-23 ENCOUNTER — Inpatient Hospital Stay: Payer: Medicaid Other | Attending: Internal Medicine

## 2021-08-23 ENCOUNTER — Inpatient Hospital Stay (HOSPITAL_BASED_OUTPATIENT_CLINIC_OR_DEPARTMENT_OTHER): Payer: Medicaid Other | Admitting: Internal Medicine

## 2021-08-23 VITALS — BP 122/68 | HR 61 | Temp 97.0°F | Resp 20 | Wt 150.9 lb

## 2021-08-23 DIAGNOSIS — Z923 Personal history of irradiation: Secondary | ICD-10-CM | POA: Insufficient documentation

## 2021-08-23 DIAGNOSIS — R569 Unspecified convulsions: Secondary | ICD-10-CM

## 2021-08-23 DIAGNOSIS — C719 Malignant neoplasm of brain, unspecified: Secondary | ICD-10-CM

## 2021-08-23 DIAGNOSIS — Z79899 Other long term (current) drug therapy: Secondary | ICD-10-CM | POA: Diagnosis not present

## 2021-08-23 DIAGNOSIS — C712 Malignant neoplasm of temporal lobe: Secondary | ICD-10-CM | POA: Insufficient documentation

## 2021-08-23 DIAGNOSIS — Z5112 Encounter for antineoplastic immunotherapy: Secondary | ICD-10-CM | POA: Diagnosis not present

## 2021-08-23 DIAGNOSIS — Z9221 Personal history of antineoplastic chemotherapy: Secondary | ICD-10-CM | POA: Insufficient documentation

## 2021-08-23 LAB — CMP (CANCER CENTER ONLY)
ALT: 13 U/L (ref 0–44)
AST: 16 U/L (ref 15–41)
Albumin: 4.2 g/dL (ref 3.5–5.0)
Alkaline Phosphatase: 53 U/L (ref 38–126)
Anion gap: 7 (ref 5–15)
BUN: 7 mg/dL (ref 6–20)
CO2: 30 mmol/L (ref 22–32)
Calcium: 9.4 mg/dL (ref 8.9–10.3)
Chloride: 102 mmol/L (ref 98–111)
Creatinine: 0.68 mg/dL (ref 0.61–1.24)
GFR, Estimated: >60 mL/min (ref >60)
Glucose, Bld: 71 mg/dL (ref 70–99)
Potassium: 3.8 mmol/L (ref 3.5–5.1)
Sodium: 139 mmol/L (ref 135–145)
Total Bilirubin: 0.9 mg/dL (ref 0.3–1.2)
Total Protein: 6.9 g/dL (ref 6.5–8.1)

## 2021-08-23 LAB — CBC WITH DIFFERENTIAL (CANCER CENTER ONLY)
Abs Immature Granulocytes: 0 10*3/uL (ref 0.00–0.07)
Basophils Absolute: 0 10*3/uL (ref 0.0–0.1)
Basophils Relative: 1 %
Eosinophils Absolute: 0.1 10*3/uL (ref 0.0–0.5)
Eosinophils Relative: 2 %
HCT: 43.2 % (ref 39.0–52.0)
Hemoglobin: 14.9 g/dL (ref 13.0–17.0)
Immature Granulocytes: 0 %
Lymphocytes Relative: 26 %
Lymphs Abs: 0.9 10*3/uL (ref 0.7–4.0)
MCH: 29.9 pg (ref 26.0–34.0)
MCHC: 34.5 g/dL (ref 30.0–36.0)
MCV: 86.7 fL (ref 80.0–100.0)
Monocytes Absolute: 0.4 10*3/uL (ref 0.1–1.0)
Monocytes Relative: 13 %
Neutro Abs: 2 10*3/uL (ref 1.7–7.7)
Neutrophils Relative %: 58 %
Platelet Count: 159 10*3/uL (ref 150–400)
RBC: 4.98 MIL/uL (ref 4.22–5.81)
RDW: 13.9 % (ref 11.5–15.5)
WBC Count: 3.4 10*3/uL — ABNORMAL LOW (ref 4.0–10.5)
nRBC: 0 % (ref 0.0–0.2)

## 2021-08-23 LAB — TOTAL PROTEIN, URINE DIPSTICK: Protein, ur: NEGATIVE mg/dL

## 2021-08-23 MED ORDER — SODIUM CHLORIDE 0.9 % IV SOLN
10.0000 mg/kg | Freq: Once | INTRAVENOUS | Status: AC
  Administered 2021-08-23: 700 mg via INTRAVENOUS
  Filled 2021-08-23: qty 28
  Filled 2021-08-23: qty 16

## 2021-08-23 MED ORDER — SODIUM CHLORIDE 0.9 % IV SOLN: Freq: Once | INTRAVENOUS | Status: AC

## 2021-08-23 NOTE — Patient Instructions (Signed)
[facility]ICAL [facility]COL[facility]  Discharge Instructions: °Thank you for choosing [facility] to provide your oncology and hematology care.  ° °If you have a lab appointment with the Cancer Center, please go directly to the Cancer Center and check in at the registration area. °  °Wear comfortable clothing and clothing appropriate for easy access to any Portacath or PICC line.  ° °We strive to give you quality time with your provider. You may need to reschedule your appointment if you arrive late (15 or more minutes).  Arriving late affects you and other patients whose appointments are after yours.  Also, if you miss three or more appointments without notifying the office, you may be dismissed from the clinic at the provider’s discretion.    °  °For prescription refill requests, have your pharmacy contact our office and allow 72 hours for refills to be completed.   ° °Today you received the following chemotherapy and/or immunotherapy agents: Bevacizumab.     °  °To help prevent nausea and vomiting after your treatment, we encourage you to take your nausea medication as directed. ° °BELOW ARE SYMPTOMS THAT SHOULD BE REPORTED IMMEDIATELY: °*FEVER GREATER THAN 100.4 F (38 °C) OR HIGHER °*CHILLS OR SWEATING °*NAUSEA AND VOMITING THAT IS NOT CONTROLLED WITH YOUR NAUSEA MEDICATION °*UNUSUAL SHORTNESS OF BREATH °*UNUSUAL BRUISING OR BLEEDING °*URINARY PROBLEMS (pain or burning when urinating, or frequent urination) °*BOWEL PROBLEMS (unusual diarrhea, constipation, pain near the anus) °TENDERNESS IN MOUTH AND THROAT WITH OR WITHOUT PRESENCE OF ULCERS (sore throat, sores in mouth, or a toothache) °UNUSUAL RASH, SWELLING OR PAIN  °UNUSUAL VAGINAL DISCHARGE OR ITCHING  ° °Items with * indicate a potential emergency and should be followed up as soon as possible or go to the Emergency Department if any problems should occur. ° °Please show the CHEMOTHERAPY ALERT CARD or IMMUNOTHERAPY ALERT CARD at check-in  to the Emergency Department and triage nurse. ° °Should you have questions after your visit or need to cancel or reschedule your appointment, please contact [facility]  Dept: [phone]  and follow the prompts.  Office hours are 8:00 a.m. to 4:30 p.m. Monday - Friday. Please note that voicemails left after 4:00 p.m. may not be returned until the following business day.  We are closed weekends and major holidays. You have access to a nurse at all times for urgent questions. Please call the main number to the clinic Dept: [phone] and follow the prompts. ° ° °For any non-urgent questions, you may also contact your provider using MyChart. We now offer e-Visits for anyone 18 and older to request care online for non-urgent symptoms. For details visit [email or web address]. °  °Also download the MyChart app! Go to the app store, search "MyChart", open the app, select [place], and log in with your MyChart username and password. ° °Due to Covid, a mask is required upon entering the hospital/clinic. If you do not have a mask, one will be given to you upon arrival. For doctor visits, patients may have 1 support person aged 18 or older with them. For treatment visits, patients cannot have anyone with them due to current Covid guidelines and our immunocompromised population.  ° °

## 2021-08-23 NOTE — Progress Notes (Signed)
[facility] at [facility] [address]  [place], [place] [address] [phone]   Interval Evaluation  Date of Service: 08/23/21 [name] Patient MRN: [id number] Patient DOB: Apr 06, 1961 Provider: Henreitta Leber, MD  Identifying Statement:  Hunter Terry is a 61 y.o. male with left temporal glioblastoma   Oncologic History: Oncology History  Glioblastoma, IDH-wildtype ([facility])  06/15/2020 Surgery   Stereotactic biopsy, L temporal Maisie Fus).  Path demonstrates glioblastoma IDH-1 wt   08/02/2020 Surgery   Debulking craniotomy with Dr. Maisie Fus   09/05/2020 - 10/17/2020 Radiation Therapy   IMRT with concurrent Temozolomide [name])   11/19/2020 -  Chemotherapy   Initiates cycle #1 adjuvant Temozolomide       07/17/2021 -  Chemotherapy   Patient is on Treatment Plan : BRAIN Lomustine q42d     07/26/2021 -  Chemotherapy   Patient is on Treatment Plan : BRAIN GBM Bevacizumab 14d x 6 cycles       Biomarkers:  MGMT Unknown.  IDH 1/2 Wild type.  EGFR Unknown  TERT "Mutated   Interval History:  Hunter Terry presents today for avastin infusion, now 4 weeks into CCNU cycle #1.  Denies new or progressive deficits. No balance issues, motor dysfunction.  Remains off decadron.  Continues on Keppra.   H+P (07/24/20) Patient presented to medical attention in late October, 2021 with new onset seizure.  Event was decribed as loss of consciousness, sudden, without clarity on further details aside from altered mental status upon awakening.  CNS imaging demonsrated non-enhancing mass within left temporal lobe c/w likely glioma; he underwent stereotactic biopsy with Dr. Maisie Fus on 06/15/20.  There was significant delay in finalizing path, explaining delay in follow up and evaluation.  He denies any seizures since discharge from hospital, on 4 anti-seizure drugs.  He does complain of dizziness and drowsiness with the medications, however.  Also describes  impaired short term memory.  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    Medications: Current Outpatient Medications on File Prior to Visit  Medication Sig Dispense Refill   Carboxymethylcellul-Glycerin (LUBRICATING EYE DROPS OP) Place 1 drop into both eyes daily as needed (dry eyes).     levETIRAcetam (KEPPRA) 750 MG tablet Take 1 tablet (750 mg total) by mouth 2 (two) times daily. 60 tablet 0   ondansetron (ZOFRAN) 8 MG tablet Take 1 tablet (8 mg total) by mouth 2 (two) times daily as needed. Start on the third day after chemotherapy. (Patient not taking: Reported on 08/23/2021) 30 tablet 1   [DISCONTINUED] lacosamide (VIMPAT) 200 MG TABS tablet Take 0.5 tablets (100 mg total) by mouth 2 (two) times daily. 30 tablet 0   No current facility-administered medications on file prior to visit.    Allergies: No Known Allergies Past Medical History:  Past Medical History:  Diagnosis Date   Cancer ([facility])    BRAIN TUMOR   Headache    Seizure [facility])    Past Surgical History:  Past Surgical History:  Procedure Laterality Date   APPLICATION OF CRANIAL NAVIGATION N/A 06/15/2020   Procedure: APPLICATION OF CRANIAL NAVIGATION;  Surgeon: Bedelia Person, MD;  Location: [facility] OR;  Service: Neurosurgery;  Laterality: N/A;   APPLICATION OF CRANIAL NAVIGATION N/A 08/02/2020   Procedure: APPLICATION OF CRANIAL NAVIGATION;  Surgeon: Bedelia Person, MD;  Location: [facility] OR;  Service: Neurosurgery;  Laterality: N/A;   CRANIOTOMY N/A 08/02/2020   Procedure: CRANIOTOMY LEFT TEMPORAL LOBECTOMY FOR TUMOR EXCISION;  Surgeon:  [name], MD;  Location: [facility];  Service: Neurosurgery;  Laterality: N/A;   FRAMELESS  BIOPSY WITH BRAINLAB Left 06/15/2020   Procedure: Left temporal lobe stereotactic brain biopsy with brainlab;  Surgeon: [name], MD;  Location: [facility];  Service: Neurosurgery;  Laterality: Left;   Social History:  Social History   Socioeconomic History   Marital status:  Married    Spouse name: Not on file   Number of children: Not on file   Years of education: Not on file   Highest education level: Not on file  Occupational History   Not on file  Tobacco Use   Smoking status: Never   Smokeless tobacco: Never  Substance and Sexual Activity   Alcohol use: Never   Drug use: Not on file   Sexual activity: Yes    Partners: Female  Other Topics Concern   Not on file  Social History Narrative   Not on file   Social Determinants of Health   Financial Resource Strain: Not on file  Food Insecurity: Not on file  Transportation Needs: Not on file  Physical Activity: Not on file  Stress: Not on file  Social Connections: Not on file  Intimate Partner Violence: Not on file   Family History:  Family History  Problem Relation Age of Onset   Cancer Neg Hx     Review of Systems: Constitutional: Doesn't report fevers, chills or abnormal weight loss Eyes: Doesn't report blurriness of vision Ears, nose, mouth, throat, and face: Doesn't report sore throat Respiratory: Doesn't report cough, dyspnea or wheezes Cardiovascular: Doesn't report palpitation, chest discomfort  Gastrointestinal:  Doesn't report nausea, constipation, diarrhea GU: Doesn't report incontinence Skin: Doesn't report skin rashes Neurological: Per HPI Musculoskeletal: Doesn't report joint pain Behavioral/Psych: Doesn't report anxiety  Physical Exam: Vitals:   08/23/21 1025  BP: 122/68  Pulse: 61  Resp: 20  Temp: (!) 97 F (36.1 C)  SpO2: 100%   KPS: 90. General: Alert, cooperative, pleasant, in no acute distress Head: Normal EENT: No conjunctival injection or scleral icterus.  Lungs: Resp effort normal Cardiac: Regular rate Abdomen: Non-distended abdomen Skin: No rashes cyanosis or petechiae. Extremities: No clubbing or edema  Neurologic Exam: Mental Status: Awake, alert, attentive to examiner. Oriented to self and environment. Language is fluent with intact  comprehension.  Cranial Nerves: Visual acuity is grossly normal. Visual fields are full. Extra-ocular movements intact. No ptosis. Face is symmetric Motor: Tone and bulk are normal. Power is full in both arms and legs. Reflexes are symmetric, no pathologic reflexes present.  Sensory: Intact to light touch Gait: Normal.   Labs: I have reviewed the data as listed    Component Value Date/Time   NA 139 08/23/2021 1000   K 3.8 08/23/2021 1000   CL 102 08/23/2021 1000   CO2 30 08/23/2021 1000   GLUCOSE 71 08/23/2021 1000   BUN 7 08/23/2021 1000   CREATININE 0.68 08/23/2021 1000   CALCIUM 9.4 08/23/2021 1000   PROT 6.9 08/23/2021 1000   ALBUMIN 4.2 08/23/2021 1000   AST 16 08/23/2021 1000   ALT 13 08/23/2021 1000   ALKPHOS 53 08/23/2021 1000   BILITOT 0.9 08/23/2021 1000   GFRNONAA >60 08/23/2021 1000   Lab Results  Component Value Date   WBC 3.4 (L) 08/23/2021   NEUTROABS 2.0 08/23/2021   HGB 14.9 08/23/2021   HCT 43.2 08/23/2021   MCV 86.7 08/23/2021   PLT 159 08/23/2021    Assessment/Plan Glioblastoma, IDH-wildtype ([place]) [  C71.9]  Hunter Terry is clinically stable today. Labs are within normal limits.  No clinical changes.  We recommended continuation of second line therapy with CCNU $RemoveB'90mg'KDdaeZRv$ /m2 PO q6 weeks, now day 12942, and Avastin $RemoveBef'10mg'QocuMcRFWh$ /kg IV q2 weeks.    Chemotherapy should be held for the following:  ANC less than 1,000  Platelets less than 100,000  LFT or creatinine greater than 2x ULN  If clinical concerns/contraindications develop  Avastin should be held for the following:  ANC less than 500  Platelets less than 50,000  LFT or creatinine greater than 2x ULN  If clinical concerns/contraindications develop  Keppra will stay at $Remov'750mg'vLVmcP$  BID.  We ask that Hunter Terry return to clinic in 2 weeks following MRI brain for evaluation.  We appreciate the opportunity to participate in the care of Hunter Terry.    All questions were answered. The patient  knows to call the clinic with any problems, questions or concerns. No barriers to learning were detected.  The total time spent in the encounter was 30 minutes and more than 50% was on counseling and review of test results   Ventura Sellers, MD Medical Director of Neuro-Oncology [facility] at [place] 08/23/21 2:07 PM

## 2021-08-24 ENCOUNTER — Telehealth: Payer: Self-pay | Admitting: Internal Medicine

## 2021-08-24 NOTE — Telephone Encounter (Signed)
Scheduled per 1/12 los, pt has been called and confirmed appt via interpreter

## 2021-08-27 ENCOUNTER — Telehealth: Payer: Self-pay | Admitting: *Deleted

## 2021-08-27 NOTE — Telephone Encounter (Signed)
Received call from [facility].  Called questioning if the patient will still be on Gleostine and if he is then they will need a new Rx submitted.  See last one ordered was 07/19/2021 and dose was taken on 07/26/2021 according to pharmacy note.   Patients next appointment is scheduled for 09/06/2021.  Routing to MD to review and see if Rx needs to be ordered.

## 2021-08-31 ENCOUNTER — Other Ambulatory Visit: Payer: Self-pay

## 2021-08-31 ENCOUNTER — Ambulatory Visit (HOSPITAL_COMMUNITY)
Admission: RE | Admit: 2021-08-31 | Discharge: 2021-08-31 | Disposition: A | Discharge status: Home / Self Care | Payer: Medicaid Other | Source: Ambulatory Visit | Attending: Internal Medicine | Admitting: Internal Medicine

## 2021-08-31 DIAGNOSIS — C719 Malignant neoplasm of brain, unspecified: Secondary | ICD-10-CM

## 2021-08-31 IMAGING — MR MR HEAD WO/W CM
14 series · 48 of 48 positions shown · IV contrast (gadavist)
Comparison: [DATE]

CLINICAL DATA: Brain/CNS neoplasm, assess treatment response;
glioblastoma

EXAM:
MRI HEAD WITHOUT AND WITH CONTRAST
TECHNIQUE: Multiplanar, multiecho pulse sequences of the brain and surrounding
structures were obtained without and with intravenous contrast.
CONTRAST:  7mL GADAVIST GADOBUTROL 1 MMOL/ML IV SOLN

[Series 5: DWI · axial · 3.0mm · 1.36mm/px · z∈[-28,+112]mm · 7 of 96 slices shown (1 of 2)]
[im 1/96]
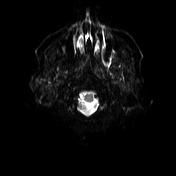
[im 16/96]
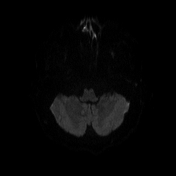
[im 32/96]
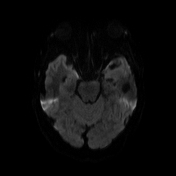
[im 48/96]
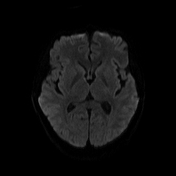
[im 64/96]
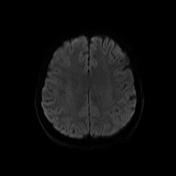
[im 80/96]
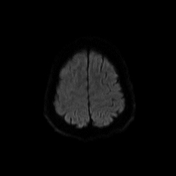
[im 96/96]
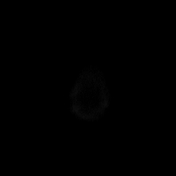

[Series 6: DWI · axial · 3.0mm · 1.36mm/px · z∈[-28,+112]mm · 3 of 48 slices shown (2 of 2)]
[im 1/48]
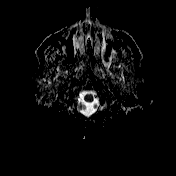
[im 24/48]
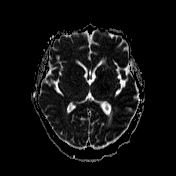
[im 48/48]
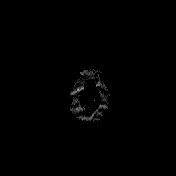

[Series 7: T1 · sagittal · 5.0mm · 0.75mm/px · 1 of 24 slices shown (1 of 4)]
[im 1/24]
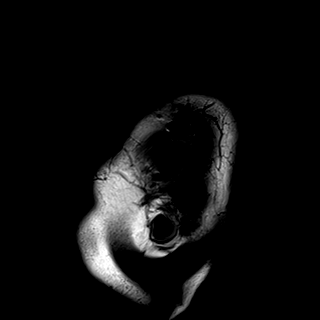

[Series 8: T2 · axial · 5.0mm · 0.62mm/px · 1 of 26 slices shown]
[im 1/26]
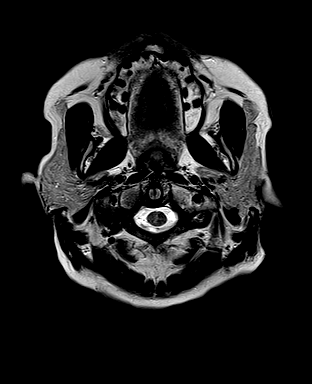

[Series 9: swi_images · axial · 3.0mm · 0.75mm/px · z∈[-40,+124]mm · 3 of 56 slices shown]
[im 1/56]
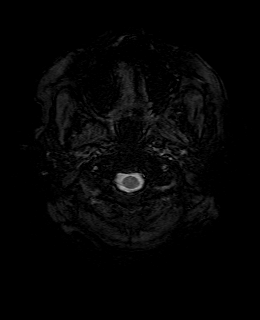
[im 28/56]
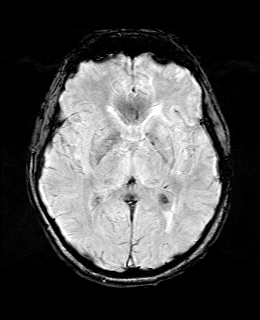
[im 56/56]
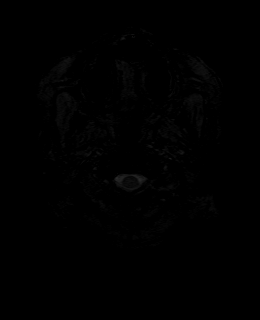

[Series 11: FLAIR · axial · 3.0mm · 0.75mm/px · z∈[-34,+118]mm · 3 of 52 slices shown]
[im 1/52]
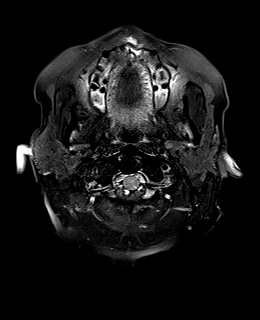
[im 26/52]
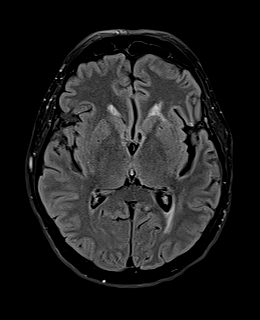
[im 52/52]
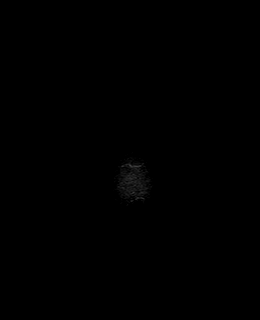

[Series 12: T1 · axial · 1.0mm · 0.94mm/px · z∈[-37,+121]mm · 9 of 160 slices shown (2 of 4)]
[im 1/160]
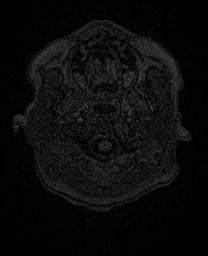
[im 20/160]
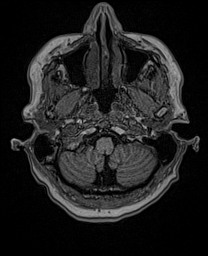
[im 40/160]
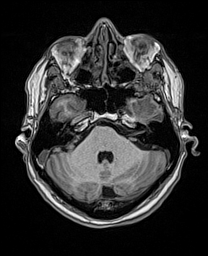
[im 60/160]
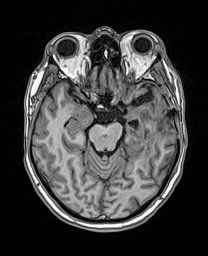
[im 80/160]
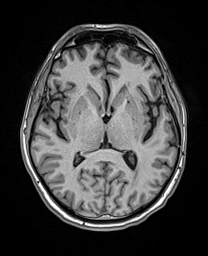
[im 100/160]
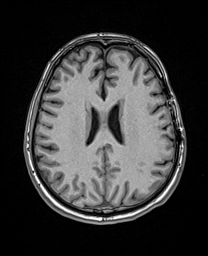
[im 120/160]
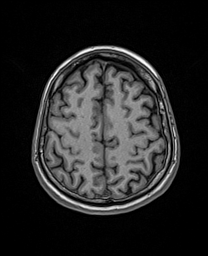
[im 140/160]
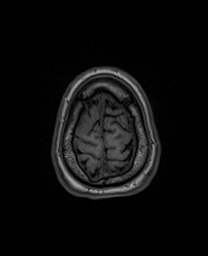
[im 160/160]
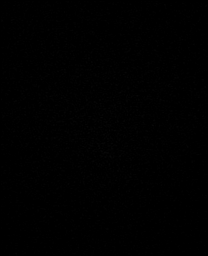

[Series 13: cor dwi_tracew · coronal · 5.0mm · 1.53mm/px · 3 of 56 slices shown]
[im 1/56]
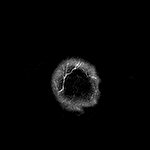
[im 28/56]
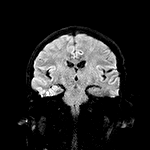
[im 56/56]
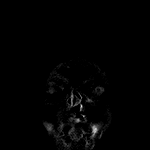

[Series 14: cor dwi_adc · coronal · 5.0mm · 1.53mm/px · 2 of 28 slices shown]
[im 1/28]
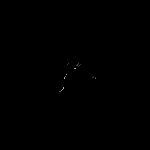
[im 28/28]
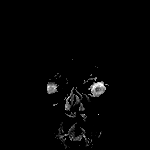

[Series 15: T2 post-contrast · coronal · 5.0mm · 0.57mm/px · 2 of 28 slices shown]
[im 1/28]
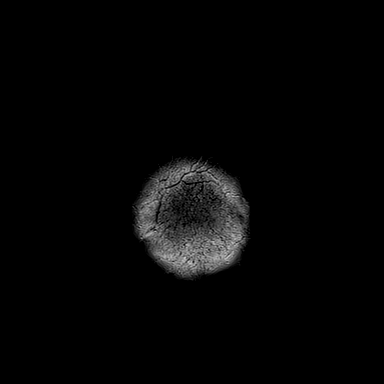
[im 28/28]
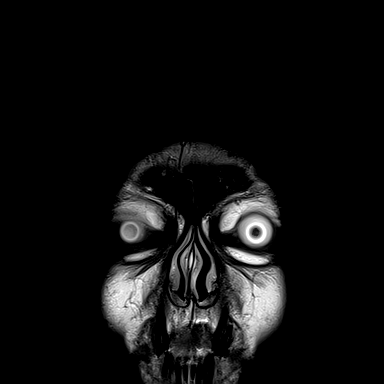

[Series 16: T1 post-contrast · axial · 1.0mm · 0.94mm/px · z∈[-37,+121]mm · 9 of 160 slices shown (1 of 2)]
[im 1/160]
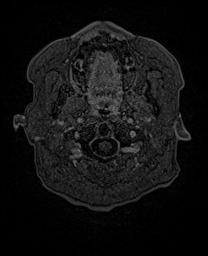
[im 20/160]
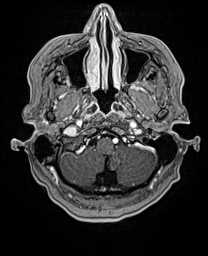
[im 40/160]
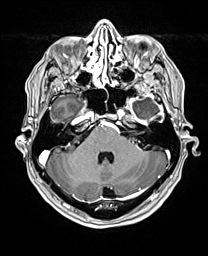
[im 60/160]
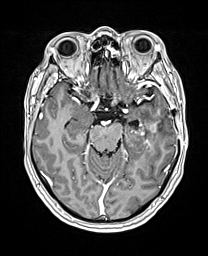
[im 80/160]
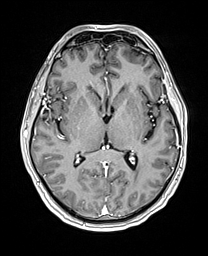
[im 100/160]
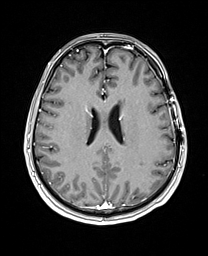
[im 120/160]
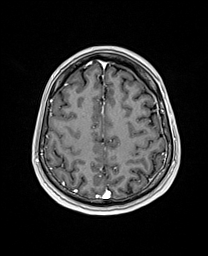
[im 140/160]
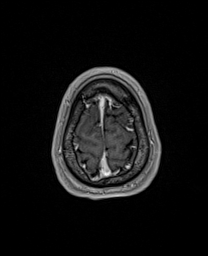
[im 160/160]
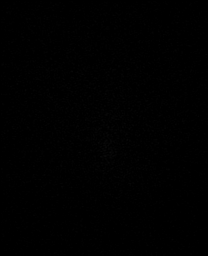

[Series 17: T1 · sagittal · 4.0mm · 0.94mm/px · 2 of 37 slices shown (3 of 4)]
[im 1/37]
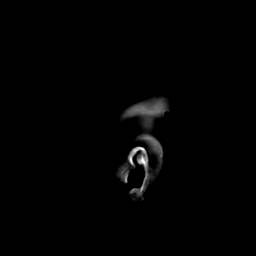
[im 37/37]
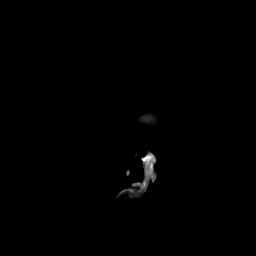

[Series 18: T1 · coronal · 4.0mm · 0.94mm/px · 2 of 38 slices shown (4 of 4)]
[im 1/38]
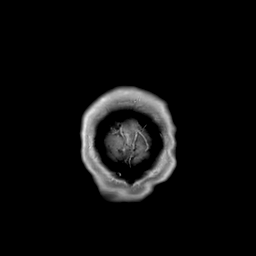
[im 38/38]
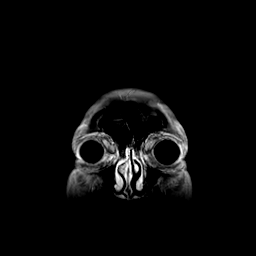

[Series 20: T1 post-contrast · sagittal · 5.0mm · 0.75mm/px · 1 of 24 slices shown (2 of 2)]
[im 1/24]
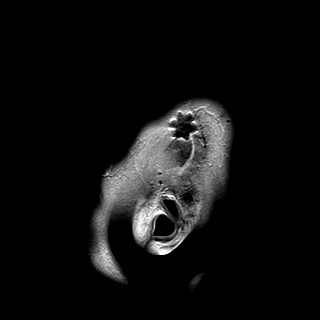

[48 of 48 positions shown; findings below may reference images not displayed]

FINDINGS: Brain: There is persistent but decreased heterogeneous enhancement
in the left temporal lobe. T2 FLAIR hyperintensity in this region
previously extending posteriorly into the periatrial white matter
and occipital lobe has substantially decreased. As result, there is
decreased mass effect with improved visualization of the left
temporal horn. Anterior left temporal resection cavity is again
identified with stable surrounding enhancement.

There is no acute infarction.  No hydrocephalus.

Vascular: Major vessel flow voids at the skull base are preserved.

Skull and upper cervical spine: Normal marrow signal is preserved.

Sinuses/Orbits: Paranasal sinuses are aerated. Orbits are
unremarkable.

Other: Sella is unremarkable.  Mastoid air cells are clear.
IMPRESSION: Persistent but significantly decreased heterogeneous enhancement in
the left temporal lobe. Surrounding T2 FLAIR hyperintensity is also
substantially decreased. Likely related to bevacizumab treatment.

## 2021-08-31 MED ORDER — GADOBUTROL 1 MMOL/ML IV SOLN
7.0000 mL | Freq: Once | INTRAVENOUS | Status: AC | PRN
  Administered 2021-08-31: 7 mL via INTRAVENOUS

## 2021-09-04 ENCOUNTER — Telehealth: Payer: Self-pay

## 2021-09-04 ENCOUNTER — Other Ambulatory Visit: Payer: Self-pay | Admitting: Internal Medicine

## 2021-09-04 MED ORDER — LEVETIRACETAM 750 MG PO TABS: 750.0000 mg | ORAL_TABLET | Freq: Two times a day (BID) | ORAL | 3 refills | Status: DC

## 2021-09-04 NOTE — Telephone Encounter (Signed)
Received a call from [facility] asking if the patient was ready for his next prescription. The only medication I see through them is the [place], and he is out of refills.

## 2021-09-06 ENCOUNTER — Inpatient Hospital Stay: Payer: Medicaid Other

## 2021-09-06 ENCOUNTER — Other Ambulatory Visit: Payer: Self-pay | Admitting: Internal Medicine

## 2021-09-06 ENCOUNTER — Other Ambulatory Visit: Payer: Self-pay

## 2021-09-06 ENCOUNTER — Inpatient Hospital Stay (HOSPITAL_BASED_OUTPATIENT_CLINIC_OR_DEPARTMENT_OTHER): Payer: Medicaid Other | Admitting: Internal Medicine

## 2021-09-06 ENCOUNTER — Other Ambulatory Visit (HOSPITAL_COMMUNITY): Payer: Self-pay

## 2021-09-06 VITALS — BP 123/82 | HR 63 | Temp 97.8°F | Resp 18 | Ht 65.0 in | Wt 152.0 lb

## 2021-09-06 DIAGNOSIS — C719 Malignant neoplasm of brain, unspecified: Secondary | ICD-10-CM

## 2021-09-06 DIAGNOSIS — Z5112 Encounter for antineoplastic immunotherapy: Secondary | ICD-10-CM | POA: Diagnosis not present

## 2021-09-06 DIAGNOSIS — R569 Unspecified convulsions: Secondary | ICD-10-CM | POA: Diagnosis not present

## 2021-09-06 LAB — CBC WITH DIFFERENTIAL (CANCER CENTER ONLY)
Abs Immature Granulocytes: 0.01 10*3/uL (ref 0.00–0.07)
Basophils Absolute: 0 10*3/uL (ref 0.0–0.1)
Basophils Relative: 0 %
Eosinophils Absolute: 0.1 10*3/uL (ref 0.0–0.5)
Eosinophils Relative: 4 %
HCT: 45.8 % (ref 39.0–52.0)
Hemoglobin: 15.9 g/dL (ref 13.0–17.0)
Immature Granulocytes: 0 %
Lymphocytes Relative: 28 %
Lymphs Abs: 1 10*3/uL (ref 0.7–4.0)
MCH: 29.4 pg (ref 26.0–34.0)
MCHC: 34.7 g/dL (ref 30.0–36.0)
MCV: 84.8 fL (ref 80.0–100.0)
Monocytes Absolute: 0.4 10*3/uL (ref 0.1–1.0)
Monocytes Relative: 10 %
Neutro Abs: 2.1 10*3/uL (ref 1.7–7.7)
Neutrophils Relative %: 58 %
Platelet Count: 289 10*3/uL (ref 150–400)
RBC: 5.4 MIL/uL (ref 4.22–5.81)
RDW: 13.3 % (ref 11.5–15.5)
WBC Count: 3.6 10*3/uL — ABNORMAL LOW (ref 4.0–10.5)
nRBC: 0 % (ref 0.0–0.2)

## 2021-09-06 LAB — TOTAL PROTEIN, URINE DIPSTICK: Protein, ur: NEGATIVE mg/dL

## 2021-09-06 LAB — CMP (CANCER CENTER ONLY)
ALT: 17 U/L (ref 0–44)
AST: 20 U/L (ref 15–41)
Albumin: 4.4 g/dL (ref 3.5–5.0)
Alkaline Phosphatase: 59 U/L (ref 38–126)
Anion gap: 6 (ref 5–15)
BUN: 8 mg/dL (ref 6–20)
CO2: 28 mmol/L (ref 22–32)
Calcium: 9.8 mg/dL (ref 8.9–10.3)
Chloride: 103 mmol/L (ref 98–111)
Creatinine: 0.74 mg/dL (ref 0.61–1.24)
GFR, Estimated: >60 mL/min (ref >60)
Glucose, Bld: 93 mg/dL (ref 70–99)
Potassium: 4.2 mmol/L (ref 3.5–5.1)
Sodium: 137 mmol/L (ref 135–145)
Total Bilirubin: 0.5 mg/dL (ref 0.3–1.2)
Total Protein: 7.3 g/dL (ref 6.5–8.1)

## 2021-09-06 MED ORDER — LOMUSTINE 10 MG PO CAPS: 20.0000 mg | ORAL_CAPSULE | Freq: Once | ORAL | 0 refills | Status: DC

## 2021-09-06 MED ORDER — LOMUSTINE 100 MG PO CAPS
100.0000 mg | ORAL_CAPSULE | Freq: Once | ORAL | 0 refills | Status: DC
  Filled 2021-09-06: qty 1, 1d supply, fill #0

## 2021-09-06 MED ORDER — LOMUSTINE 10 MG PO CAPS: 20.0000 mg | ORAL_CAPSULE | Freq: Once | ORAL | 0 refills | Status: AC

## 2021-09-06 MED ORDER — SODIUM CHLORIDE 0.9 % IV SOLN
10.0000 mg/kg | Freq: Once | INTRAVENOUS | Status: AC
  Administered 2021-09-06: 700 mg via INTRAVENOUS
  Filled 2021-09-06: qty 12

## 2021-09-06 MED ORDER — LOMUSTINE 100 MG PO CAPS: 100.0000 mg | ORAL_CAPSULE | Freq: Once | ORAL | 0 refills | Status: DC

## 2021-09-06 MED ORDER — LOMUSTINE 40 MG PO CAPS: 40.0000 mg | ORAL_CAPSULE | Freq: Once | ORAL | 0 refills | Status: AC

## 2021-09-06 MED ORDER — LOMUSTINE 10 MG PO CAPS
20.0000 mg | ORAL_CAPSULE | Freq: Once | ORAL | 0 refills | Status: DC
  Filled 2021-09-06: qty 2, 1d supply, fill #0

## 2021-09-06 MED ORDER — LOMUSTINE 100 MG PO CAPS: 100.0000 mg | ORAL_CAPSULE | Freq: Once | ORAL | 0 refills | Status: AC

## 2021-09-06 MED ORDER — LOMUSTINE 40 MG PO CAPS
40.0000 mg | ORAL_CAPSULE | Freq: Once | ORAL | 0 refills | Status: DC
  Filled 2021-09-06: qty 1, 1d supply, fill #0

## 2021-09-06 MED ORDER — LOMUSTINE 40 MG PO CAPS: 40.0000 mg | ORAL_CAPSULE | Freq: Once | ORAL | 0 refills | Status: DC

## 2021-09-06 MED ORDER — SODIUM CHLORIDE 0.9 % IV SOLN: Freq: Once | INTRAVENOUS | Status: AC

## 2021-09-06 NOTE — Progress Notes (Signed)
[facility] [place], [place] [address] [phone]   Interval Evaluation  Date of Service: 09/06/21 [name] Patient MRN: [id number] Patient DOB: 1960/12/31 Provider: Ventura Sellers, MD  Identifying Statement:  Hunter Terry is a 62 y.o. male with left temporal glioblastoma   Oncologic History: Oncology History  Glioblastoma, IDH-wildtype ([place])  06/15/2020 Surgery   Stereotactic biopsy, L temporal [name]).  Path demonstrates glioblastoma IDH-1 wt   08/02/2020 Surgery   Debulking craniotomy with Dr. [name]   09/05/2020 - 10/17/2020 Radiation Therapy   IMRT with concurrent Temozolomide [name])   11/19/2020 -  Chemotherapy   Initiates cycle #1 adjuvant Temozolomide       07/17/2021 -  Chemotherapy   Patient is on Treatment Plan : BRAIN Lomustine q42d     07/26/2021 -  Chemotherapy   Patient is on Treatment Plan : BRAIN GBM Bevacizumab 14d x 6 cycles       Biomarkers:  MGMT Unknown.  IDH 1/2 Wild type.  EGFR Unknown  TERT "Mutated   Interval History:  Hunter Terry presents today for follow up after brain MRI, now having completed cycle #1 CCNU/avastin.  Continues to deny new or progressive deficits. No balance issues, motor dysfunction.  Remains off decadron.  Continues on Keppra.   H+P (07/24/20) Patient presented to medical attention in late October, 2021 with new onset seizure.  Event was decribed as loss of consciousness, sudden, without clarity on further details aside from altered mental status upon awakening.  CNS imaging demonsrated non-enhancing mass within left temporal lobe c/w likely glioma; he underwent stereotactic biopsy with Dr. [name] on 06/15/20.  There was significant delay in finalizing path, explaining delay in follow up and evaluation.  He denies any seizures since discharge from hospital, on 4 anti-seizure drugs.  He does complain of dizziness and drowsiness with the  medications, however.  Also describes impaired short term memory.  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    Medications: Current Outpatient Medications on File Prior to Visit  Medication Sig Dispense Refill   Carboxymethylcellul-Glycerin (LUBRICATING EYE DROPS OP) Place 1 drop into both eyes daily as needed (dry eyes).     levETIRAcetam (KEPPRA) 750 MG tablet Take 1 tablet (750 mg total) by mouth 2 (two) times daily. 60 tablet 3   ondansetron (ZOFRAN) 8 MG tablet Take 1 tablet (8 mg total) by mouth 2 (two) times daily as needed. Start on the third day after chemotherapy. 30 tablet 1   [DISCONTINUED] lacosamide (VIMPAT) 200 MG TABS tablet Take 0.5 tablets (100 mg total) by mouth 2 (two) times daily. 30 tablet 0   No current facility-administered medications on file prior to visit.    Allergies: No Known Allergies Past Medical History:  Past Medical History:  Diagnosis Date   Cancer ([place])    BRAIN TUMOR   Headache    Seizure [facility])    Past Surgical History:  Past Surgical History:  Procedure Laterality Date   APPLICATION OF CRANIAL NAVIGATION N/A 06/15/2020   Procedure: APPLICATION OF CRANIAL NAVIGATION;  Surgeon: [name], MD;  Location: [facility];  Service: Neurosurgery;  Laterality: N/A;   APPLICATION OF CRANIAL NAVIGATION N/A 08/02/2020   Procedure: APPLICATION OF CRANIAL NAVIGATION;  Surgeon: [name], MD;  Location: [facility];  Service: Neurosurgery;  Laterality: N/A;   CRANIOTOMY N/A 08/02/2020   Procedure: CRANIOTOMY LEFT TEMPORAL LOBECTOMY FOR TUMOR EXCISION;  Surgeon: Duffy Rhody  G, MD;  Location: [facility];  Service: Neurosurgery;  Laterality: N/A;   FRAMELESS  BIOPSY WITH BRAINLAB Left 06/15/2020   Procedure: Left temporal lobe stereotactic brain biopsy with brainlab;  Surgeon: [name], MD;  Location: [facility];  Service: Neurosurgery;  Laterality: Left;   Social History:  Social History   Socioeconomic History   Marital status: Married     Spouse name: Not on file   Number of children: Not on file   Years of education: Not on file   Highest education level: Not on file  Occupational History   Not on file  Tobacco Use   Smoking status: Never   Smokeless tobacco: Never  Substance and Sexual Activity   Alcohol use: Never   Drug use: Not on file   Sexual activity: Yes    Partners: Female  Other Topics Concern   Not on file  Social History Narrative   Not on file   Social Determinants of Health   Financial Resource Strain: Not on file  Food Insecurity: Not on file  Transportation Needs: Not on file  Physical Activity: Not on file  Stress: Not on file  Social Connections: Not on file  Intimate Partner Violence: Not on file   Family History:  Family History  Problem Relation Age of Onset   Cancer Neg Hx     Review of Systems: Constitutional: Doesn't report fevers, chills or abnormal weight loss Eyes: Doesn't report blurriness of vision Ears, nose, mouth, throat, and face: Doesn't report sore throat Respiratory: Doesn't report cough, dyspnea or wheezes Cardiovascular: Doesn't report palpitation, chest discomfort  Gastrointestinal:  Doesn't report nausea, constipation, diarrhea GU: Doesn't report incontinence Skin: Doesn't report skin rashes Neurological: Per HPI Musculoskeletal: Doesn't report joint pain Behavioral/Psych: Doesn't report anxiety  Physical Exam: Vitals:   09/06/21 1023  BP: 123/82  Pulse: 63  Resp: 18  Temp: 97.8 F (36.6 C)  SpO2: 97%   KPS: 90. General: Alert, cooperative, pleasant, in no acute distress Head: Normal EENT: No conjunctival injection or scleral icterus.  Lungs: Resp effort normal Cardiac: Regular rate Abdomen: Non-distended abdomen Skin: No rashes cyanosis or petechiae. Extremities: No clubbing or edema  Neurologic Exam: Mental Status: Awake, alert, attentive to examiner. Oriented to self and environment. Language is fluent with intact comprehension.  Cranial  Nerves: Visual acuity is grossly normal. Visual fields are full. Extra-ocular movements intact. No ptosis. Face is symmetric Motor: Tone and bulk are normal. Power is full in both arms and legs. Reflexes are symmetric, no pathologic reflexes present.  Sensory: Intact to light touch Gait: Normal.   Labs: I have reviewed the data as listed    Component Value Date/Time   NA 137 09/06/2021 0954   K 4.2 09/06/2021 0954   CL 103 09/06/2021 0954   CO2 28 09/06/2021 0954   GLUCOSE 93 09/06/2021 0954   BUN 8 09/06/2021 0954   CREATININE 0.74 09/06/2021 0954   CALCIUM 9.8 09/06/2021 0954   PROT 7.3 09/06/2021 0954   ALBUMIN 4.4 09/06/2021 0954   AST 20 09/06/2021 0954   ALT 17 09/06/2021 0954   ALKPHOS 59 09/06/2021 0954   BILITOT 0.5 09/06/2021 0954   GFRNONAA >60 09/06/2021 0954   Lab Results  Component Value Date   WBC 3.6 (L) 09/06/2021   NEUTROABS 2.1 09/06/2021   HGB 15.9 09/06/2021   HCT 45.8 09/06/2021   MCV 84.8 09/06/2021   PLT 289 09/06/2021   Imaging:  [facility] Clinician Interpretation: I have personally  reviewed the CNS images as listed.  My interpretation, in the context of the patient's clinical presentation, is stable disease  MR BRAIN W WO CONTRAST  Result Date: 08/31/2021 CLINICAL DATA:  Brain/CNS neoplasm, assess treatment response; glioblastoma EXAM: MRI HEAD WITHOUT AND WITH CONTRAST TECHNIQUE: Multiplanar, multiecho pulse sequences of the brain and surrounding structures were obtained without and with intravenous contrast. CONTRAST:  26m GADAVIST GADOBUTROL 1 MMOL/ML IV SOLN COMPARISON:  07/16/2021 FINDINGS: Brain: There is persistent but decreased heterogeneous enhancement in the left temporal lobe. T2 FLAIR hyperintensity in this region previously extending posteriorly into the periatrial white matter and occipital lobe has substantially decreased. As result, there is decreased mass effect with improved visualization of the left temporal horn. Anterior left temporal  resection cavity is again identified with stable surrounding enhancement. There is no acute infarction.  No hydrocephalus. Vascular: Major vessel flow voids at the skull base are preserved. Skull and upper cervical spine: Normal marrow signal is preserved. Sinuses/Orbits: Paranasal sinuses are aerated. Orbits are unremarkable. Other: Sella is unremarkable.  Mastoid air cells are clear. IMPRESSION: Persistent but significantly decreased heterogeneous enhancement in the left temporal lobe. Surrounding T2 FLAIR hyperintensity is also substantially decreased. Likely related to bevacizumab treatment. Electronically Signed   By: PMacy MisM.D.   On: 08/31/2021 14:48     Assessment/Plan Glioblastoma, IDH-wildtype ([place] [C71.9]  Dustyn RQuillinis clinically and radiographically stable today, now having completed first cycle of CCNU/Avastin.  Brain MRI shows reduction in enhancing volume and T2/FLAIR signal volume.  Labs are within normal limits.    We recommended continuation of second line therapy with cycle #2 CCNU 945mm2 PO q6 weeks and Avastin 1059mg IV q2 weeks.    Chemotherapy should be held for the following:  ANC less than 1,000  Platelets less than 100,000  LFT or creatinine greater than 2x ULN  If clinical concerns/contraindications develop  Avastin should be held for the following:  ANC less than 500  Platelets less than 50,000  LFT or creatinine greater than 2x ULN  If clinical concerns/contraindications develop  Keppra will stay at 750m74mD.  We ask that Eucl[place]ico [place] to clinic in 2 weeks for avastin.  We appreciate the opportunity to participate in the care of [place] All questions were answered. The patient knows to call the clinic with any problems, questions or concerns. No barriers to learning were detected.  The total time spent in the encounter was 40 minutes and more than 50% was on counseling and review of test results   ZachVentura Sellers Medical Director of Neuro-Oncology [facility]26/23 10:43 AM

## 2021-09-06 NOTE — Patient Instructions (Signed)
[facility] MEDICAL ONCOLOGY  Discharge Instructions: °Thank you for choosing [facility] to provide your oncology and hematology care.  ° °If you have a lab appointment with the Cancer Center, please go directly to the Cancer Center and check in at the registration area. °  °Wear comfortable clothing and clothing appropriate for easy access to any Portacath or PICC line.  ° °We strive to give you quality time with your provider. You may need to reschedule your appointment if you arrive late (15 or more minutes).  Arriving late affects you and other patients whose appointments are after yours.  Also, if you miss three or more appointments without notifying the office, you may be dismissed from the clinic at the provider’s discretion.    °  °For prescription refill requests, have your pharmacy contact our office and allow 72 hours for refills to be completed.   ° °Today you received the following chemotherapy and/or immunotherapy agents: Bevacizumab.     °  °To help prevent nausea and vomiting after your treatment, we encourage you to take your nausea medication as directed. ° °BELOW ARE SYMPTOMS THAT SHOULD BE REPORTED IMMEDIATELY: °*FEVER GREATER THAN 100.4 F (38 °C) OR HIGHER °*CHILLS OR SWEATING °*NAUSEA AND VOMITING THAT IS NOT CONTROLLED WITH YOUR NAUSEA MEDICATION °*UNUSUAL SHORTNESS OF BREATH °*UNUSUAL BRUISING OR BLEEDING °*URINARY PROBLEMS (pain or burning when urinating, or frequent urination) °*BOWEL PROBLEMS (unusual diarrhea, constipation, pain near the anus) °TENDERNESS IN MOUTH AND THROAT WITH OR WITHOUT PRESENCE OF ULCERS (sore throat, sores in mouth, or a toothache) °UNUSUAL RASH, SWELLING OR PAIN  °UNUSUAL VAGINAL DISCHARGE OR ITCHING  ° °Items with * indicate a potential emergency and should be followed up as soon as possible or go to the Emergency Department if any problems should occur. ° °Please show the CHEMOTHERAPY ALERT CARD or IMMUNOTHERAPY ALERT CARD at check-in  to the Emergency Department and triage nurse. ° °Should you have questions after your visit or need to cancel or reschedule your appointment, please contact [facility]  Dept: [phone]  and follow the prompts.  Office hours are 8:00 a.m. to 4:30 p.m. Monday - Friday. Please note that voicemails left after 4:00 p.m. may not be returned until the following business day.  We are closed weekends and major holidays. You have access to a nurse at all times for urgent questions. Please call the main number to the clinic Dept: [phone] and follow the prompts. ° ° °For any non-urgent questions, you may also contact your provider using MyChart. We now offer e-Visits for anyone 18 and older to request care online for non-urgent symptoms. For details visit [email or web address]. °  °Also download the MyChart app! Go to the app store, search "MyChart", open the app, select [facility], and log in with your MyChart username and password. ° °Due to Covid, a mask is required upon entering the hospital/clinic. If you do not have a mask, one will be given to you upon arrival. For doctor visits, patients may have 1 support person aged 18 or older with them. For treatment visits, patients cannot have anyone with them due to current Covid guidelines and our immunocompromised population.  ° °

## 2021-09-07 ENCOUNTER — Telehealth: Payer: Self-pay | Admitting: Pharmacist

## 2021-09-07 NOTE — Telephone Encounter (Signed)
Oral Chemotherapy Pharmacist Encounter   Spoke with patient's wife, with use of [employer], [place] ([place] [phone]) today to follow up regarding patient's oral chemotherapy medication: Gleostine (lomustine)  Made patient's wife is aware that Sherrlyn Hock was delivered to the [facility] today and is ready for pick up. Patient's wife stated they will attempt to come today before 4:30PM, and if not will come Monday to pick up Gleostine.   Leron Croak, PharmD, BCPS Hematology/Oncology Clinical Pharmacist [facility] [place] [phone] 09/07/2021 3:09 PM

## 2021-09-10 NOTE — Telephone Encounter (Signed)
Oral Chemotherapy Pharmacist Encounter   Met with patient in [facility] lobby for patient to pick up next cycle of oral chemotherapy medication: Gleostine (lomustine)  Patient's 1st dose of Gleostine was taken 07/26/22 Patient will take 2nd dose of Gleostine 09/10/21  Reviewed with patient that he will take one lomustine 174m capsule, one 40 mg capsule, and two 10 mg capsules (1632mtotal dose) by mouth once, and he may take at bedtime and on an empty stomach to decrease nausea and vomiting.   He will take Zofran 8 mg PO 30-60 minutes prior to taking lomustine.   Patient knows to call the office with questions or concerns.  ReLeron CroakPharmD, BCPS Hematology/Oncology Clinical Pharmacist [phone]/30/2023 11:40 AM

## 2021-09-20 ENCOUNTER — Inpatient Hospital Stay (HOSPITAL_BASED_OUTPATIENT_CLINIC_OR_DEPARTMENT_OTHER): Payer: Medicaid Other | Admitting: Internal Medicine

## 2021-09-20 ENCOUNTER — Inpatient Hospital Stay: Payer: Medicaid Other | Attending: Internal Medicine

## 2021-09-20 ENCOUNTER — Other Ambulatory Visit: Payer: Self-pay

## 2021-09-20 ENCOUNTER — Inpatient Hospital Stay: Payer: Medicaid Other

## 2021-09-20 VITALS — BP 120/68 | HR 63 | Temp 97.7°F | Wt 152.6 lb

## 2021-09-20 VITALS — Resp 18

## 2021-09-20 DIAGNOSIS — Z923 Personal history of irradiation: Secondary | ICD-10-CM | POA: Insufficient documentation

## 2021-09-20 DIAGNOSIS — R42 Dizziness and giddiness: Secondary | ICD-10-CM | POA: Insufficient documentation

## 2021-09-20 DIAGNOSIS — R55 Syncope and collapse: Secondary | ICD-10-CM | POA: Insufficient documentation

## 2021-09-20 DIAGNOSIS — C719 Malignant neoplasm of brain, unspecified: Secondary | ICD-10-CM

## 2021-09-20 DIAGNOSIS — Z79899 Other long term (current) drug therapy: Secondary | ICD-10-CM | POA: Insufficient documentation

## 2021-09-20 DIAGNOSIS — Z5112 Encounter for antineoplastic immunotherapy: Secondary | ICD-10-CM | POA: Insufficient documentation

## 2021-09-20 DIAGNOSIS — R400 Somnolence: Secondary | ICD-10-CM | POA: Insufficient documentation

## 2021-09-20 DIAGNOSIS — C712 Malignant neoplasm of temporal lobe: Secondary | ICD-10-CM | POA: Diagnosis not present

## 2021-09-20 DIAGNOSIS — Z9221 Personal history of antineoplastic chemotherapy: Secondary | ICD-10-CM | POA: Insufficient documentation

## 2021-09-20 LAB — CBC WITH DIFFERENTIAL (CANCER CENTER ONLY)
Abs Immature Granulocytes: 0.01 10*3/uL (ref 0.00–0.07)
Basophils Absolute: 0 10*3/uL (ref 0.0–0.1)
Basophils Relative: 0 %
Eosinophils Absolute: 0 10*3/uL (ref 0.0–0.5)
Eosinophils Relative: 1 %
HCT: 43.3 % (ref 39.0–52.0)
Hemoglobin: 15.1 g/dL (ref 13.0–17.0)
Immature Granulocytes: 0 %
Lymphocytes Relative: 27 %
Lymphs Abs: 0.8 10*3/uL (ref 0.7–4.0)
MCH: 29.4 pg (ref 26.0–34.0)
MCHC: 34.9 g/dL (ref 30.0–36.0)
MCV: 84.2 fL (ref 80.0–100.0)
Monocytes Absolute: 0.5 10*3/uL (ref 0.1–1.0)
Monocytes Relative: 16 %
Neutro Abs: 1.6 10*3/uL — ABNORMAL LOW (ref 1.7–7.7)
Neutrophils Relative %: 56 %
Platelet Count: 270 10*3/uL (ref 150–400)
RBC: 5.14 MIL/uL (ref 4.22–5.81)
RDW: 13.2 % (ref 11.5–15.5)
WBC Count: 2.8 10*3/uL — ABNORMAL LOW (ref 4.0–10.5)
nRBC: 0 % (ref 0.0–0.2)

## 2021-09-20 LAB — CMP (CANCER CENTER ONLY)
ALT: 13 U/L (ref 0–44)
AST: 17 U/L (ref 15–41)
Albumin: 4.3 g/dL (ref 3.5–5.0)
Alkaline Phosphatase: 60 U/L (ref 38–126)
Anion gap: 5 (ref 5–15)
BUN: 9 mg/dL (ref 6–20)
CO2: 28 mmol/L (ref 22–32)
Calcium: 9.4 mg/dL (ref 8.9–10.3)
Chloride: 105 mmol/L (ref 98–111)
Creatinine: 0.65 mg/dL (ref 0.61–1.24)
GFR, Estimated: >60 mL/min (ref >60)
Glucose, Bld: 97 mg/dL (ref 70–99)
Potassium: 4.2 mmol/L (ref 3.5–5.1)
Sodium: 138 mmol/L (ref 135–145)
Total Bilirubin: 0.5 mg/dL (ref 0.3–1.2)
Total Protein: 7 g/dL (ref 6.5–8.1)

## 2021-09-20 LAB — TOTAL PROTEIN, URINE DIPSTICK: Protein, ur: NEGATIVE mg/dL

## 2021-09-20 MED ORDER — SODIUM CHLORIDE 0.9 % IV SOLN
10.0000 mg/kg | Freq: Once | INTRAVENOUS | Status: AC
  Administered 2021-09-20: 700 mg via INTRAVENOUS
  Filled 2021-09-20: qty 16

## 2021-09-20 MED ORDER — SODIUM CHLORIDE 0.9 % IV SOLN: Freq: Once | INTRAVENOUS | Status: AC

## 2021-09-20 NOTE — Progress Notes (Signed)
[facility] [place], [place] [address] [phone]   Interval Evaluation  Date of Service: 09/20/21 [name] Patient MRN: [id number] Patient DOB: [birth year] Provider: Ventura Sellers, MD  Identifying Statement:  Hunter Terry is a 61 y.o. male with left temporal glioblastoma   Oncologic History: Oncology History  Glioblastoma, IDH-wildtype ([place])  06/15/2020 Surgery   Stereotactic biopsy, L temporal Hunter Terry).  Path demonstrates glioblastoma IDH-1 wt   08/02/2020 Surgery   Debulking craniotomy with Dr. Marcello Terry   09/05/2020 - 10/17/2020 Radiation Therapy   IMRT with concurrent Temozolomide [name])   11/19/2020 -  Chemotherapy   Initiates cycle #1 adjuvant Temozolomide       07/17/2021 -  Chemotherapy   Patient is on Treatment Plan : BRAIN Lomustine q42d     07/26/2021 -  Chemotherapy   Patient is on Treatment Plan : BRAIN GBM Bevacizumab 14d x 6 cycles       Biomarkers:  MGMT Unknown.  IDH 1/2 Wild type.  EGFR Unknown  TERT "Mutated   Interval History:  Hunter Terry presents today for follow up for avastin infusion, now having started cycle #2 of CCNU.  Continues to deny new or progressive deficits, though he did experience body cramping with the CCNU.  No balance issues, motor dysfunction.  Remains off decadron.  Continues on Keppra.   H+P (07/24/20) Patient presented to medical attention in late October, 2021 with new onset seizure.  Event was decribed as loss of consciousness, sudden, without clarity on further details aside from altered mental status upon awakening.  CNS imaging demonsrated non-enhancing mass within left temporal lobe c/w likely glioma; he underwent stereotactic biopsy with Dr. Marcello Terry on 06/15/20.  There was significant delay in finalizing path, explaining delay in follow up and evaluation.  He denies any seizures since discharge from hospital, on 4 anti-seizure drugs.  He does  complain of dizziness and drowsiness with the medications, however.  Also describes impaired short term memory.  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    Medications: Current Outpatient Medications on File Prior to Visit  Medication Sig Dispense Refill   Carboxymethylcellul-Glycerin (LUBRICATING EYE DROPS OP) Place 1 drop into both eyes daily as needed (dry eyes).     levETIRAcetam (KEPPRA) 750 MG tablet Take 1 tablet (750 mg total) by mouth 2 (two) times daily. 60 tablet 3   ondansetron (ZOFRAN) 8 MG tablet Take 1 tablet (8 mg total) by mouth 2 (two) times daily as needed. Start on the third day after chemotherapy. 30 tablet 1   [DISCONTINUED] lacosamide (VIMPAT) 200 MG TABS tablet Take 0.5 tablets (100 mg total) by mouth 2 (two) times daily. 30 tablet 0   No current facility-administered medications on file prior to visit.    Allergies: No Known Allergies Past Medical History:  Past Medical History:  Diagnosis Date   Cancer ([place])    BRAIN TUMOR   Headache    Seizure [facility])    Past Surgical History:  Past Surgical History:  Procedure Laterality Date   APPLICATION OF CRANIAL NAVIGATION N/A 06/15/2020   Procedure: APPLICATION OF CRANIAL NAVIGATION;  Surgeon: [name], MD;  Location: [place];  Service: Neurosurgery;  Laterality: N/A;   APPLICATION OF CRANIAL NAVIGATION N/A 08/02/2020   Procedure: APPLICATION OF CRANIAL NAVIGATION;  Surgeon: [name], MD;  Location: [facility];  Service: Neurosurgery;  Laterality: N/A;   CRANIOTOMY N/A 08/02/2020  Procedure: CRANIOTOMY LEFT TEMPORAL LOBECTOMY FOR TUMOR EXCISION;  Surgeon: [name], MD;  Location: [place];  Service: Neurosurgery;  Laterality: N/A;   FRAMELESS  BIOPSY WITH BRAINLAB Left 06/15/2020   Procedure: Left temporal lobe stereotactic brain biopsy with brainlab;  Surgeon: [name], MD;  Location: [place];  Service: Neurosurgery;  Laterality: Left;   Social History:  Social History    Socioeconomic History   Marital status: Married    Spouse name: Not on file   Number of children: Not on file   Years of education: Not on file   Highest education level: Not on file  Occupational History   Not on file  Tobacco Use   Smoking status: Never   Smokeless tobacco: Never  Substance and Sexual Activity   Alcohol use: Never   Drug use: Not on file   Sexual activity: Yes    Partners: Female  Other Topics Concern   Not on file  Social History Narrative   Not on file   Social Determinants of Health   Financial Resource Strain: Not on file  Food Insecurity: Not on file  Transportation Needs: Not on file  Physical Activity: Not on file  Stress: Not on file  Social Connections: Not on file  Intimate Partner Violence: Not on file   Family History:  Family History  Problem Relation Age of Onset   Cancer Neg Hx     Review of Systems: Constitutional: Doesn't report fevers, chills or abnormal weight loss Eyes: Doesn't report blurriness of vision Ears, nose, mouth, throat, and face: Doesn't report sore throat Respiratory: Doesn't report cough, dyspnea or wheezes Cardiovascular: Doesn't report palpitation, chest discomfort  Gastrointestinal:  Doesn't report nausea, constipation, diarrhea GU: Doesn't report incontinence Skin: Doesn't report skin rashes Neurological: Per HPI Musculoskeletal: Doesn't report joint pain Behavioral/Psych: Doesn't report anxiety  Physical Exam: Vitals:   09/20/21 1056  BP: 120/68  Pulse: 63  Temp: 97.7 F (36.5 C)  SpO2: 100%   KPS: 90. General: Alert, cooperative, pleasant, in no acute distress Head: Normal EENT: No conjunctival injection or scleral icterus.  Lungs: Resp effort normal Cardiac: Regular rate Abdomen: Non-distended abdomen Skin: No rashes cyanosis or petechiae. Extremities: No clubbing or edema  Neurologic Exam: Mental Status: Awake, alert, attentive to examiner. Oriented to self and environment. Language  is fluent with intact comprehension.  Cranial Nerves: Visual acuity is grossly normal. Visual fields are full. Extra-ocular movements intact. No ptosis. Face is symmetric Motor: Tone and bulk are normal. Power is full in both arms and legs. Reflexes are symmetric, no pathologic reflexes present.  Sensory: Intact to light touch Gait: Normal.   Labs: I have reviewed the data as listed    Component Value Date/Time   NA 138 09/20/2021 1044   K 4.2 09/20/2021 1044   CL 105 09/20/2021 1044   CO2 28 09/20/2021 1044   GLUCOSE 97 09/20/2021 1044   BUN 9 09/20/2021 1044   CREATININE 0.65 09/20/2021 1044   CALCIUM 9.4 09/20/2021 1044   PROT 7.0 09/20/2021 1044   ALBUMIN 4.3 09/20/2021 1044   AST 17 09/20/2021 1044   ALT 13 09/20/2021 1044   ALKPHOS 60 09/20/2021 1044   BILITOT 0.5 09/20/2021 1044   GFRNONAA >60 09/20/2021 1044   Lab Results  Component Value Date   WBC 2.8 (L) 09/20/2021   NEUTROABS 1.6 (L) 09/20/2021   HGB 15.1 09/20/2021   HCT 43.3 09/20/2021   MCV 84.2 09/20/2021   PLT 270 09/20/2021  Imaging:  [facility] Clinician Interpretation: I have personally reviewed the CNS images as listed.  My interpretation, in the context of the patient's clinical presentation, is stable disease  MR BRAIN W WO CONTRAST  Result Date: 08/31/2021 CLINICAL DATA:  Brain/CNS neoplasm, assess treatment response; glioblastoma EXAM: MRI HEAD WITHOUT AND WITH CONTRAST TECHNIQUE: Multiplanar, multiecho pulse sequences of the brain and surrounding structures were obtained without and with intravenous contrast. CONTRAST:  17m GADAVIST GADOBUTROL 1 MMOL/ML IV SOLN COMPARISON:  07/16/2021 FINDINGS: Brain: There is persistent but decreased heterogeneous enhancement in the left temporal lobe. T2 FLAIR hyperintensity in this region previously extending posteriorly into the periatrial white matter and occipital lobe has substantially decreased. As result, there is decreased mass effect with improved  visualization of the left temporal horn. Anterior left temporal resection cavity is again identified with stable surrounding enhancement. There is no acute infarction.  No hydrocephalus. Vascular: Major vessel flow voids at the skull base are preserved. Skull and upper cervical spine: Normal marrow signal is preserved. Sinuses/Orbits: Paranasal sinuses are aerated. Orbits are unremarkable. Other: Sella is unremarkable.  Mastoid air cells are clear. IMPRESSION: Persistent but significantly decreased heterogeneous enhancement in the left temporal lobe. Surrounding T2 FLAIR hyperintensity is also substantially decreased. Likely related to bevacizumab treatment. Electronically Signed   By: PMacy MisM.D.   On: 08/31/2021 14:48     Assessment/Plan Glioblastoma, IDH-wildtype ([place] [C71.9]  Jabril RBrodheadis clinically and radiographically stable today, now having dosed cycle #2 of CCNU/Avastin (09/10/21).    We recommended continuation of second line therapy with cycle #2 CCNU 946mm2 PO q6 weeks and Avastin 1038mg IV q2 weeks.    Chemotherapy should be held for the following:  ANC less than 1,000  Platelets less than 100,000  LFT or creatinine greater than 2x ULN  If clinical concerns/contraindications develop  Avastin should be held for the following:  ANC less than 500  Platelets less than 50,000  LFT or creatinine greater than 2x ULN  If clinical concerns/contraindications develop  Keppra will stay at 750m57mD.  We ask that EuclToben Acunaurn to clinic in 2 weeks for avastin.  Next MRI can be scheduled for 10/26/21 as discussed.  We appreciate the opportunity to participate in the care of [place] All questions were answered. The patient knows to call the clinic with any problems, questions or concerns. No barriers to learning were detected.  The total time spent in the encounter was 30 minutes and more than 50% was on counseling and review of test  results   ZachVentura Sellers Medical Director of Neuro-Oncology [facility] CenterWeslArthur09/23 12:08 PM

## 2021-09-20 NOTE — Patient Instructions (Signed)
[facility] MEDICAL ONCOLOGY  Discharge Instructions: °Thank you for choosing [facility] to provide your oncology and hematology care.  ° °If you have a lab appointment with the Cancer Center, please go directly to the Cancer Center and check in at the registration area. °  °Wear comfortable clothing and clothing appropriate for easy access to any Portacath or PICC line.  ° °We strive to give you quality time with your provider. You may need to reschedule your appointment if you arrive late (15 or more minutes).  Arriving late affects you and other patients whose appointments are after yours.  Also, if you miss three or more appointments without notifying the office, you may be dismissed from the clinic at the provider’s discretion.    °  °For prescription refill requests, have your pharmacy contact our office and allow 72 hours for refills to be completed.   ° °Today you received the following chemotherapy and/or immunotherapy agents: Bevacizumab.     °  °To help prevent nausea and vomiting after your treatment, we encourage you to take your nausea medication as directed. ° °BELOW ARE SYMPTOMS THAT SHOULD BE REPORTED IMMEDIATELY: °*FEVER GREATER THAN 100.4 F (38 °C) OR HIGHER °*CHILLS OR SWEATING °*NAUSEA AND VOMITING THAT IS NOT CONTROLLED WITH YOUR NAUSEA MEDICATION °*UNUSUAL SHORTNESS OF BREATH °*UNUSUAL BRUISING OR BLEEDING °*URINARY PROBLEMS (pain or burning when urinating, or frequent urination) °*BOWEL PROBLEMS (unusual diarrhea, constipation, pain near the anus) °TENDERNESS IN MOUTH AND THROAT WITH OR WITHOUT PRESENCE OF ULCERS (sore throat, sores in mouth, or a toothache) °UNUSUAL RASH, SWELLING OR PAIN  °UNUSUAL VAGINAL DISCHARGE OR ITCHING  ° °Items with * indicate a potential emergency and should be followed up as soon as possible or go to the Emergency Department if any problems should occur. ° °Please show the CHEMOTHERAPY ALERT CARD or IMMUNOTHERAPY ALERT CARD at check-in  to the Emergency Department and triage nurse. ° °Should you have questions after your visit or need to cancel or reschedule your appointment, please contact [facility]ICAL [facility]COL[facility]  Dept: [phone]  and follow the prompts.  Office hours are 8:00 a.m. to 4:30 p.m. Monday - Friday. Please note that voicemails left after 4:00 p.m. may not be returned until the following business day.  We are closed weekends and major holidays. You have access to a nurse at all times for urgent questions. Please call the main number to the clinic Dept: [phone] and follow the prompts. ° ° °For any non-urgent questions, you may also contact your provider using MyChart. We now offer e-Visits for anyone 18 and older to request care online for non-urgent symptoms. For details visit [email or web address]. °  °Also download the MyChart app! Go to the app store, search "MyChart", open the app, select [facility], and log in with your MyChart username and password. ° °Due to Covid, a mask is required upon entering the hospital/clinic. If you do not have a mask, one will be given to you upon arrival. For doctor visits, patients may have 1 support person aged 18 or older with them. For treatment visits, patients cannot have anyone with them due to current Covid guidelines and our immunocompromised population.  ° °

## 2021-10-04 ENCOUNTER — Inpatient Hospital Stay: Payer: Medicaid Other

## 2021-10-04 ENCOUNTER — Inpatient Hospital Stay (HOSPITAL_BASED_OUTPATIENT_CLINIC_OR_DEPARTMENT_OTHER): Payer: Medicaid Other | Admitting: Internal Medicine

## 2021-10-04 ENCOUNTER — Other Ambulatory Visit: Payer: Self-pay

## 2021-10-04 VITALS — BP 124/86 | HR 62 | Temp 97.8°F | Resp 16

## 2021-10-04 VITALS — BP 116/73 | HR 67 | Temp 97.7°F | Resp 16 | Ht 65.0 in | Wt 155.8 lb

## 2021-10-04 DIAGNOSIS — C719 Malignant neoplasm of brain, unspecified: Secondary | ICD-10-CM | POA: Diagnosis not present

## 2021-10-04 DIAGNOSIS — R569 Unspecified convulsions: Secondary | ICD-10-CM

## 2021-10-04 DIAGNOSIS — Z5112 Encounter for antineoplastic immunotherapy: Secondary | ICD-10-CM | POA: Diagnosis not present

## 2021-10-04 LAB — TOTAL PROTEIN, URINE DIPSTICK: Protein, ur: NEGATIVE mg/dL

## 2021-10-04 LAB — CBC WITH DIFFERENTIAL (CANCER CENTER ONLY)
Abs Immature Granulocytes: 0 10*3/uL (ref 0.00–0.07)
Basophils Absolute: 0 10*3/uL (ref 0.0–0.1)
Basophils Relative: 1 %
Eosinophils Absolute: 0 10*3/uL (ref 0.0–0.5)
Eosinophils Relative: 1 %
HCT: 45.1 % (ref 39.0–52.0)
Hemoglobin: 15.4 g/dL (ref 13.0–17.0)
Immature Granulocytes: 0 %
Lymphocytes Relative: 28 %
Lymphs Abs: 0.8 10*3/uL (ref 0.7–4.0)
MCH: 29.4 pg (ref 26.0–34.0)
MCHC: 34.1 g/dL (ref 30.0–36.0)
MCV: 86.2 fL (ref 80.0–100.0)
Monocytes Absolute: 0.5 10*3/uL (ref 0.1–1.0)
Monocytes Relative: 18 %
Neutro Abs: 1.6 10*3/uL — ABNORMAL LOW (ref 1.7–7.7)
Neutrophils Relative %: 52 %
Platelet Count: 168 10*3/uL (ref 150–400)
RBC: 5.23 MIL/uL (ref 4.22–5.81)
RDW: 14 % (ref 11.5–15.5)
WBC Count: 3 10*3/uL — ABNORMAL LOW (ref 4.0–10.5)
nRBC: 0 % (ref 0.0–0.2)

## 2021-10-04 LAB — CMP (CANCER CENTER ONLY)
ALT: 16 U/L (ref 0–44)
AST: 20 U/L (ref 15–41)
Albumin: 4.3 g/dL (ref 3.5–5.0)
Alkaline Phosphatase: 55 U/L (ref 38–126)
Anion gap: 4 — ABNORMAL LOW (ref 5–15)
BUN: 9 mg/dL (ref 6–20)
CO2: 29 mmol/L (ref 22–32)
Calcium: 9.4 mg/dL (ref 8.9–10.3)
Chloride: 105 mmol/L (ref 98–111)
Creatinine: 0.73 mg/dL (ref 0.61–1.24)
GFR, Estimated: >60 mL/min (ref >60)
Glucose, Bld: 90 mg/dL (ref 70–99)
Potassium: 4.4 mmol/L (ref 3.5–5.1)
Sodium: 138 mmol/L (ref 135–145)
Total Bilirubin: 0.4 mg/dL (ref 0.3–1.2)
Total Protein: 7 g/dL (ref 6.5–8.1)

## 2021-10-04 MED ORDER — SODIUM CHLORIDE 0.9 % IV SOLN
10.0000 mg/kg | Freq: Once | INTRAVENOUS | Status: AC
  Administered 2021-10-04: 700 mg via INTRAVENOUS
  Filled 2021-10-04: qty 16

## 2021-10-04 MED ORDER — SODIUM CHLORIDE 0.9 % IV SOLN: Freq: Once | INTRAVENOUS | Status: AC

## 2021-10-04 NOTE — Patient Instructions (Signed)
[facility] MEDICAL [facility]COLOG[facility]  Discharge Instructions: °Thank you for choosing [facility] to provide your oncology and hematology care.  ° °If you have a lab appointment with the Cancer Center, please go directly to the Cancer Center and check in at the registration area. °  °Wear comfortable clothing and clothing appropriate for easy access to any Portacath or PICC line.  ° °We strive to give you quality time with your provider. You may need to reschedule your appointment if you arrive late (15 or more minutes).  Arriving late affects you and other patients whose appointments are after yours.  Also, if you miss three or more appointments without notifying the office, you may be dismissed from the clinic at the provider’s discretion.    °  °For prescription refill requests, have your pharmacy contact our office and allow 72 hours for refills to be completed.   ° °Today you received the following chemotherapy and/or immunotherapy agents: Bevacizumab.     °  °To help prevent nausea and vomiting after your treatment, we encourage you to take your nausea medication as directed. ° °BELOW ARE SYMPTOMS THAT SHOULD BE REPORTED IMMEDIATELY: °*FEVER GREATER THAN 100.4 F (38 °C) OR HIGHER °*CHILLS OR SWEATING °*NAUSEA AND VOMITING THAT IS NOT CONTROLLED WITH YOUR NAUSEA MEDICATION °*UNUSUAL SHORTNESS OF BREATH °*UNUSUAL BRUISING OR BLEEDING °*URINARY PROBLEMS (pain or burning when urinating, or frequent urination) °*BOWEL PROBLEMS (unusual diarrhea, constipation, pain near the anus) °TENDERNESS IN MOUTH AND THROAT WITH OR WITHOUT PRESENCE OF ULCERS (sore throat, sores in mouth, or a toothache) °UNUSUAL RASH, SWELLING OR PAIN  °UNUSUAL VAGINAL DISCHARGE OR ITCHING  ° °Items with * indicate a potential emergency and should be followed up as soon as possible or go to the Emergency Department if any problems should occur. ° °Please show the CHEMOTHERAPY ALERT CARD or IMMUNOTHERAPY ALERT CARD at check-in  to the Emergency Department and triage nurse. ° °Should you have questions after your visit or need to cancel or reschedule your appointment, please contact [facility]  Dept: [phone]  and follow the prompts.  Office hours are 8:00 a.m. to 4:30 p.m. Monday - Friday. Please note that voicemails left after 4:00 p.m. may not be returned until the following business day.  We are closed weekends and major holidays. You have access to a nurse at all times for urgent questions. Please call the main number to the clinic Dept: [phone] and follow the prompts. ° ° °For any non-urgent questions, you may also contact your provider using MyChart. We now offer e-Visits for anyone 18 and older to request care online for non-urgent symptoms. For details visit [email or web address]. °  °Also download the MyChart app! Go to the app store, search "MyChart", open the app, select , and log in with your MyChart username and password. ° °Due to Covid, a mask is required upon entering the hospital/clinic. If you do not have a mask, one will be given to you upon arrival. For doctor visits, patients may have 1 support person aged 18 or older with them. For treatment visits, patients cannot have anyone with them due to current Covid guidelines and our immunocompromised population.  ° °

## 2021-10-04 NOTE — Progress Notes (Signed)
[facility] [place], [place] [address] [phone]   Interval Evaluation  Date of Service: 10/04/21 [name] Patient MRN: [id number] Patient DOB: [birth year] Provider: Ventura Sellers, MD  Identifying Statement:  Hunter Terry is a 61 y.o. male with left temporal glioblastoma   Oncologic History: Oncology History  Glioblastoma, IDH-wildtype ([place])  06/15/2020 Surgery   Stereotactic biopsy, L temporal [name]).  Path demonstrates glioblastoma IDH-1 wt   08/02/2020 Surgery   Debulking craniotomy with Dr. [name]   09/05/2020 - 10/17/2020 Radiation Therapy   IMRT with concurrent Temozolomide [name])   11/19/2020 -  Chemotherapy   Initiates cycle #1 adjuvant Temozolomide       07/17/2021 -  Chemotherapy   Patient is on Treatment Plan : BRAIN Lomustine q42d     07/26/2021 -  Chemotherapy   Patient is on Treatment Plan : BRAIN GBM Bevacizumab 14d x 6 cycles       Biomarkers:  MGMT Unknown.  IDH 1/2 Wild type.  EGFR Unknown  TERT "Mutated   Interval History:  Hunter Terry presents today for follow up for avastin infusion, continuing on cycle #2 of CCNU.  Again no new or progressive issues noted today.  No balance issues, motor dysfunction.  Remains off decadron.  Continues on Keppra, though he has decreased dosing to once a day on his own.  H+P (07/24/20) Patient presented to medical attention in late October, 2021 with new onset seizure.  Event was decribed as loss of consciousness, sudden, without clarity on further details aside from altered mental status upon awakening.  CNS imaging demonsrated non-enhancing mass within left temporal lobe c/w likely glioma; he underwent stereotactic biopsy with Dr. [name] on 06/15/20.  There was significant delay in finalizing path, explaining delay in follow up and evaluation.  He denies any seizures since discharge from hospital, on 4 anti-seizure drugs.  He does  complain of dizziness and drowsiness with the medications, however.  Also describes impaired short term memory.  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    Medications: Current Outpatient Medications on File Prior to Visit  Medication Sig Dispense Refill   Carboxymethylcellul-Glycerin (LUBRICATING EYE DROPS OP) Place 1 drop into both eyes daily as needed (dry eyes).     levETIRAcetam (KEPPRA) 750 MG tablet Take 1 tablet (750 mg total) by mouth 2 (two) times daily. 60 tablet 3   ondansetron (ZOFRAN) 8 MG tablet Take 1 tablet (8 mg total) by mouth 2 (two) times daily as needed. Start on the third day after chemotherapy. 30 tablet 1   [DISCONTINUED] lacosamide (VIMPAT) 200 MG TABS tablet Take 0.5 tablets (100 mg total) by mouth 2 (two) times daily. 30 tablet 0   No current facility-administered medications on file prior to visit.    Allergies: No Known Allergies Past Medical History:  Past Medical History:  Diagnosis Date   Cancer ([facility])    BRAIN TUMOR   Headache    Seizure [facility])    Past Surgical History:  Past Surgical History:  Procedure Laterality Date   APPLICATION OF CRANIAL NAVIGATION N/A 06/15/2020   Procedure: APPLICATION OF CRANIAL NAVIGATION;  Surgeon: [name], MD;  Location: [facility];  Service: Neurosurgery;  Laterality: N/A;   APPLICATION OF CRANIAL NAVIGATION N/A 08/02/2020   Procedure: APPLICATION OF CRANIAL NAVIGATION;  Surgeon: [name], MD;  Location: [facility];  Service: Neurosurgery;  Laterality: N/A;   CRANIOTOMY N/A 08/02/2020  Procedure: CRANIOTOMY LEFT TEMPORAL LOBECTOMY FOR TUMOR EXCISION;  Surgeon: [name], MD;  Location: [facility];  Service: Neurosurgery;  Laterality: N/A;   FRAMELESS  BIOPSY WITH BRAINLAB Left 06/15/2020   Procedure: Left temporal lobe stereotactic brain biopsy with brainlab;  Surgeon: [name], MD;  Location: [facility];  Service: Neurosurgery;  Laterality: Left;   Social History:  Social History    Socioeconomic History   Marital status: Married    Spouse name: Not on file   Number of children: Not on file   Years of education: Not on file   Highest education level: Not on file  Occupational History   Not on file  Tobacco Use   Smoking status: Never   Smokeless tobacco: Never  Substance and Sexual Activity   Alcohol use: Never   Drug use: Not on file   Sexual activity: Yes    Partners: Female  Other Topics Concern   Not on file  Social History Narrative   Not on file   Social Determinants of Health   Financial Resource Strain: Not on file  Food Insecurity: Not on file  Transportation Needs: Not on file  Physical Activity: Not on file  Stress: Not on file  Social Connections: Not on file  Intimate Partner Violence: Not on file   Family History:  Family History  Problem Relation Age of Onset   Cancer Neg Hx     Review of Systems: Constitutional: Doesn't report fevers, chills or abnormal weight loss Eyes: Doesn't report blurriness of vision Ears, nose, mouth, throat, and face: Doesn't report sore throat Respiratory: Doesn't report cough, dyspnea or wheezes Cardiovascular: Doesn't report palpitation, chest discomfort  Gastrointestinal:  Doesn't report nausea, constipation, diarrhea GU: Doesn't report incontinence Skin: Doesn't report skin rashes Neurological: Per HPI Musculoskeletal: Doesn't report joint pain Behavioral/Psych: Doesn't report anxiety  Physical Exam: Vitals:   10/04/21 1007  BP: 116/73  Pulse: 67  Resp: 16  Temp: 97.7 F (36.5 C)  SpO2: 98%    KPS: 90. General: Alert, cooperative, pleasant, in no acute distress Head: Normal EENT: No conjunctival injection or scleral icterus.  Lungs: Resp effort normal Cardiac: Regular rate Abdomen: Non-distended abdomen Skin: No rashes cyanosis or petechiae. Extremities: No clubbing or edema  Neurologic Exam: Mental Status: Awake, alert, attentive to examiner. Oriented to self and  environment. Language is fluent with intact comprehension.  Cranial Nerves: Visual acuity is grossly normal. Visual fields are full. Extra-ocular movements intact. No ptosis. Face is symmetric Motor: Tone and bulk are normal. Power is full in both arms and legs. Reflexes are symmetric, no pathologic reflexes present.  Sensory: Intact to light touch Gait: Normal.   Labs: I have reviewed the data as listed    Component Value Date/Time   NA 138 09/20/2021 1044   K 4.2 09/20/2021 1044   CL 105 09/20/2021 1044   CO2 28 09/20/2021 1044   GLUCOSE 97 09/20/2021 1044   BUN 9 09/20/2021 1044   CREATININE 0.65 09/20/2021 1044   CALCIUM 9.4 09/20/2021 1044   PROT 7.0 09/20/2021 1044   ALBUMIN 4.3 09/20/2021 1044   AST 17 09/20/2021 1044   ALT 13 09/20/2021 1044   ALKPHOS 60 09/20/2021 1044   BILITOT 0.5 09/20/2021 1044   GFRNONAA >60 09/20/2021 1044   Lab Results  Component Value Date   WBC 2.8 (L) 09/20/2021   NEUTROABS 1.6 (L) 09/20/2021   HGB 15.1 09/20/2021   HCT 43.3 09/20/2021   MCV 84.2 09/20/2021  PLT 270 09/20/2021    Assessment/Plan Glioblastoma, IDH-wildtype (Hunter Terry) [C71.9]  Hunter Terry is clinically and radiographically stable today, now mid-way through cycle #2 of CCNU/Avastin (dosed 09/10/21).  Labs remain within normal limits, mild neutropenia aside.  We recommended continuation of second line therapy with cycle #2 CCNU 8m/m2 PO q6 weeks and Avastin 14mkg IV q2 weeks.    Chemotherapy should be held for the following:  ANC less than 1,000  Platelets less than 100,000  LFT or creatinine greater than 2x ULN  If clinical concerns/contraindications develop  Avastin should be held for the following:  ANC less than 500  Platelets less than 50,000  LFT or creatinine greater than 2x ULN  If clinical concerns/contraindications develop  Keppra will stay at 75019mID.  We ask that Hunter Terry to clinic in 2 weeks for avastin.  Next MRI can be  scheduled for 10/26/21 as discussed.  We appreciate the opportunity to participate in the care of Hunter Terry  All questions were answered. The patient knows to call the clinic with any problems, questions or concerns. No barriers to learning were detected.  The total time spent in the encounter was 30 minutes and more than 50% was on counseling and review of test results   ZacVentura SellersD Medical Director of Neuro-Oncology [facility] [place]/23/23 10:04 AM

## 2021-10-12 ENCOUNTER — Other Ambulatory Visit: Payer: Self-pay | Admitting: Radiation Therapy

## 2021-10-12 ENCOUNTER — Telehealth: Payer: Self-pay

## 2021-10-12 NOTE — Telephone Encounter (Signed)
Called patient to advise of MRI apt on 10/20/21 @ 11:30 AM Hunter Terry. ?

## 2021-10-18 ENCOUNTER — Inpatient Hospital Stay: Payer: Medicaid Other | Attending: Internal Medicine

## 2021-10-18 ENCOUNTER — Inpatient Hospital Stay (HOSPITAL_BASED_OUTPATIENT_CLINIC_OR_DEPARTMENT_OTHER): Payer: Medicaid Other | Admitting: Internal Medicine

## 2021-10-18 ENCOUNTER — Other Ambulatory Visit: Payer: Self-pay

## 2021-10-18 ENCOUNTER — Inpatient Hospital Stay: Payer: Medicaid Other

## 2021-10-18 VITALS — BP 127/79 | HR 59 | Temp 97.7°F | Resp 18 | Wt 154.4 lb

## 2021-10-18 DIAGNOSIS — Z923 Personal history of irradiation: Secondary | ICD-10-CM | POA: Diagnosis not present

## 2021-10-18 DIAGNOSIS — Z79899 Other long term (current) drug therapy: Secondary | ICD-10-CM | POA: Diagnosis not present

## 2021-10-18 DIAGNOSIS — C712 Malignant neoplasm of temporal lobe: Secondary | ICD-10-CM | POA: Diagnosis not present

## 2021-10-18 DIAGNOSIS — Z5111 Encounter for antineoplastic chemotherapy: Secondary | ICD-10-CM | POA: Insufficient documentation

## 2021-10-18 DIAGNOSIS — C719 Malignant neoplasm of brain, unspecified: Secondary | ICD-10-CM | POA: Diagnosis not present

## 2021-10-18 DIAGNOSIS — R569 Unspecified convulsions: Secondary | ICD-10-CM | POA: Diagnosis not present

## 2021-10-18 LAB — CBC WITH DIFFERENTIAL (CANCER CENTER ONLY)
Abs Immature Granulocytes: 0.01 10*3/uL (ref 0.00–0.07)
Basophils Absolute: 0 10*3/uL (ref 0.0–0.1)
Basophils Relative: 0 %
Eosinophils Absolute: 0.1 10*3/uL (ref 0.0–0.5)
Eosinophils Relative: 4 %
HCT: 45.1 % (ref 39.0–52.0)
Hemoglobin: 15.7 g/dL (ref 13.0–17.0)
Immature Granulocytes: 0 %
Lymphocytes Relative: 24 %
Lymphs Abs: 0.9 10*3/uL (ref 0.7–4.0)
MCH: 29 pg (ref 26.0–34.0)
MCHC: 34.8 g/dL (ref 30.0–36.0)
MCV: 83.4 fL (ref 80.0–100.0)
Monocytes Absolute: 0.4 10*3/uL (ref 0.1–1.0)
Monocytes Relative: 10 %
Neutro Abs: 2.3 10*3/uL (ref 1.7–7.7)
Neutrophils Relative %: 62 %
Platelet Count: 184 10*3/uL (ref 150–400)
RBC: 5.41 MIL/uL (ref 4.22–5.81)
RDW: 13.4 % (ref 11.5–15.5)
WBC Count: 3.7 10*3/uL — ABNORMAL LOW (ref 4.0–10.5)
nRBC: 0 % (ref 0.0–0.2)

## 2021-10-18 LAB — CMP (CANCER CENTER ONLY)
ALT: 13 U/L (ref 0–44)
AST: 18 U/L (ref 15–41)
Albumin: 4.4 g/dL (ref 3.5–5.0)
Alkaline Phosphatase: 56 U/L (ref 38–126)
Anion gap: 4 — ABNORMAL LOW (ref 5–15)
BUN: 8 mg/dL (ref 6–20)
CO2: 29 mmol/L (ref 22–32)
Calcium: 9.5 mg/dL (ref 8.9–10.3)
Chloride: 103 mmol/L (ref 98–111)
Creatinine: 0.74 mg/dL (ref 0.61–1.24)
GFR, Estimated: >60 mL/min (ref >60)
Glucose, Bld: 87 mg/dL (ref 70–99)
Potassium: 4.1 mmol/L (ref 3.5–5.1)
Sodium: 136 mmol/L (ref 135–145)
Total Bilirubin: 0.6 mg/dL (ref 0.3–1.2)
Total Protein: 7.1 g/dL (ref 6.5–8.1)

## 2021-10-18 LAB — TOTAL PROTEIN, URINE DIPSTICK: Protein, ur: NEGATIVE mg/dL

## 2021-10-18 MED ORDER — SODIUM CHLORIDE 0.9 % IV SOLN: Freq: Once | INTRAVENOUS | Status: AC

## 2021-10-18 MED ORDER — SODIUM CHLORIDE 0.9 % IV SOLN
10.0000 mg/kg | Freq: Once | INTRAVENOUS | Status: AC
  Administered 2021-10-18: 13:00:00 700 mg via INTRAVENOUS
  Filled 2021-10-18: qty 16

## 2021-10-18 NOTE — Progress Notes (Signed)
[facility] [address]  [place], [place] [address] [phone]   Interval Evaluation  Date of Service: 10/18/21 [name] Patient MRN: [id number] Patient DOB: 1961-01-09 Provider: Henreitta Leber, MD  Identifying Statement:  Hunter Terry is a 61 y.o. male with left temporal glioblastoma   Oncologic History: Oncology History  Glioblastoma, IDH-wildtype ([facility])  06/15/2020 Surgery   Stereotactic biopsy, L temporal Maisie Fus).  Path demonstrates glioblastoma IDH-1 wt   08/02/2020 Surgery   Debulking craniotomy with Dr. Maisie Fus   09/05/2020 - 10/17/2020 Radiation Therapy   IMRT with concurrent Temozolomide [name])   11/19/2020 -  Chemotherapy   Initiates cycle #1 adjuvant Temozolomide       07/17/2021 -  Chemotherapy   Patient is on Treatment Plan : BRAIN Lomustine q42d     07/26/2021 -  Chemotherapy   Patient is on Treatment Plan : BRAIN GBM Bevacizumab 14d x 6 cycles       Biomarkers:  MGMT Unknown.  IDH 1/2 Wild type.  EGFR Unknown  TERT "Mutated   Interval History:  Hunter Terry presents today for follow up for avastin infusion, now during cycle #2 of CCNU.  No clinical changes noted today.  No balance issues, motor dysfunction.  Remains off decadron.  Continues on Keppra as prior.  H+P (07/24/20) Patient presented to medical attention in late October, 2021 with new onset seizure.  Event was decribed as loss of consciousness, sudden, without clarity on further details aside from altered mental status upon awakening.  CNS imaging demonsrated non-enhancing mass within left temporal lobe c/w likely glioma; he underwent stereotactic biopsy with Dr. Maisie Fus on 06/15/20.  There was significant delay in finalizing path, explaining delay in follow up and evaluation.  He denies any seizures since discharge from hospital, on 4 anti-seizure drugs.  He does complain of dizziness and drowsiness with the medications, however.   Also describes impaired short term memory.  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    Medications: Current Outpatient Medications on File Prior to Visit  Medication Sig Dispense Refill   Carboxymethylcellul-Glycerin (LUBRICATING EYE DROPS OP) Place 1 drop into both eyes daily as needed (dry eyes).     levETIRAcetam (KEPPRA) 750 MG tablet Take 1 tablet (750 mg total) by mouth 2 (two) times daily. 60 tablet 3   ondansetron (ZOFRAN) 8 MG tablet Take 1 tablet (8 mg total) by mouth 2 (two) times daily as needed. Start on the third day after chemotherapy. 30 tablet 1   [DISCONTINUED] lacosamide (VIMPAT) 200 MG TABS tablet Take 0.5 tablets (100 mg total) by mouth 2 (two) times daily. 30 tablet 0   No current facility-administered medications on file prior to visit.    Allergies: No Known Allergies Past Medical History:  Past Medical History:  Diagnosis Date   Cancer ([facility])    BRAIN TUMOR   Headache    Seizure [facility])    Past Surgical History:  Past Surgical History:  Procedure Laterality Date   APPLICATION OF CRANIAL NAVIGATION N/A 06/15/2020   Procedure: APPLICATION OF CRANIAL NAVIGATION;  Surgeon: Bedelia Person, MD;  Location: [facility] OR;  Service: Neurosurgery;  Laterality: N/A;   APPLICATION OF CRANIAL NAVIGATION N/A 08/02/2020   Procedure: APPLICATION OF CRANIAL NAVIGATION;  Surgeon: Bedelia Person, MD;  Location: [facility] OR;  Service: Neurosurgery;  Laterality: N/A;   CRANIOTOMY N/A 08/02/2020   Procedure: CRANIOTOMY LEFT TEMPORAL LOBECTOMY FOR TUMOR EXCISION;  Surgeon: Hoyt Koch  G, MD;  Location: [facility];  Service: Neurosurgery;  Laterality: N/A;   FRAMELESS  BIOPSY WITH BRAINLAB Left 06/15/2020   Procedure: Left temporal lobe stereotactic brain biopsy with brainlab;  Surgeon: [name], MD;  Location: [facility];  Service: Neurosurgery;  Laterality: Left;   Social History:  Social History   Socioeconomic History   Marital status: Married    Spouse name: Not on  file   Number of children: Not on file   Years of education: Not on file   Highest education level: Not on file  Occupational History   Not on file  Tobacco Use   Smoking status: Never   Smokeless tobacco: Never  Substance and Sexual Activity   Alcohol use: Never   Drug use: Not on file   Sexual activity: Yes    Partners: Female  Other Topics Concern   Not on file  Social History Narrative   Not on file   Social Determinants of Health   Financial Resource Strain: Not on file  Food Insecurity: Not on file  Transportation Needs: Not on file  Physical Activity: Not on file  Stress: Not on file  Social Connections: Not on file  Intimate Partner Violence: Not on file   Family History:  Family History  Problem Relation Age of Onset   Cancer Neg Hx     Review of Systems: Constitutional: Doesn't report fevers, chills or abnormal weight loss Eyes: Doesn't report blurriness of vision Ears, nose, mouth, throat, and face: Doesn't report sore throat Respiratory: Doesn't report cough, dyspnea or wheezes Cardiovascular: Doesn't report palpitation, chest discomfort  Gastrointestinal:  Doesn't report nausea, constipation, diarrhea GU: Doesn't report incontinence Skin: Doesn't report skin rashes Neurological: Per HPI Musculoskeletal: Doesn't report joint pain Behavioral/Psych: Doesn't report anxiety  Physical Exam: Vitals:   10/18/21 1115  BP: 127/79  Pulse: (!) 59  Resp: 18  Temp: 97.7 F (36.5 C)  SpO2: 99%    KPS: 90. General: Alert, cooperative, pleasant, in no acute distress Head: Normal EENT: No conjunctival injection or scleral icterus.  Lungs: Resp effort normal Cardiac: Regular rate Abdomen: Non-distended abdomen Skin: No rashes cyanosis or petechiae. Extremities: No clubbing or edema  Neurologic Exam: Mental Status: Awake, alert, attentive to examiner. Oriented to self and environment. Language is fluent with intact comprehension.  Cranial Nerves: Visual  acuity is grossly normal. Visual fields are full. Extra-ocular movements intact. No ptosis. Face is symmetric Motor: Tone and bulk are normal. Power is full in both arms and legs. Reflexes are symmetric, no pathologic reflexes present.  Sensory: Intact to light touch Gait: Normal.   Labs: I have reviewed the data as listed    Component Value Date/Time   NA 138 10/04/2021 0943   K 4.4 10/04/2021 0943   CL 105 10/04/2021 0943   CO2 29 10/04/2021 0943   GLUCOSE 90 10/04/2021 0943   BUN 9 10/04/2021 0943   CREATININE 0.73 10/04/2021 0943   CALCIUM 9.4 10/04/2021 0943   PROT 7.0 10/04/2021 0943   ALBUMIN 4.3 10/04/2021 0943   AST 20 10/04/2021 0943   ALT 16 10/04/2021 0943   ALKPHOS 55 10/04/2021 0943   BILITOT 0.4 10/04/2021 0943   GFRNONAA >60 10/04/2021 0943   Lab Results  Component Value Date   WBC 3.7 (L) 10/18/2021   NEUTROABS 2.3 10/18/2021   HGB 15.7 10/18/2021   HCT 45.1 10/18/2021   MCV 83.4 10/18/2021   PLT 184 10/18/2021    Assessment/Plan Glioblastoma, IDH-wildtype ([place]) [C71.9]  Rafay Baize is clinically and radiographically stable today, now mid-way through cycle #2 of CCNU/Avastin (dosed 09/10/21).  No new or progressive changes.  We recommended continuation of second line therapy with cycle #2 CCNU $Remov'90mg'FALCbi$ /m2 PO q6 weeks and Avastin $RemoveBef'10mg'aUICCuNjLe$ /kg IV q2 weeks.    Chemotherapy should be held for the following:  ANC less than 1,000  Platelets less than 100,000  LFT or creatinine greater than 2x ULN  If clinical concerns/contraindications develop  Avastin should be held for the following:  ANC less than 500  Platelets less than 50,000  LFT or creatinine greater than 2x ULN  If clinical concerns/contraindications develop  Keppra will stay at $Remov'750mg'GdQRXX$  BID.  We ask that Hunter Terry return to clinic in 2 weeks for avastin.  He has MRI upcoming later this week.  We appreciate the opportunity to participate in the care of Hunter Terry.    All  questions were answered. The patient knows to call the clinic with any problems, questions or concerns. No barriers to learning were detected.  The total time spent in the encounter was 30 minutes and more than 50% was on counseling and review of test results   Ventura Sellers, MD Medical Director of Neuro-Oncology [facility] 10/18/21 11:25 AM

## 2021-10-20 ENCOUNTER — Ambulatory Visit (HOSPITAL_COMMUNITY)
Admission: RE | Admit: 2021-10-20 | Discharge: 2021-10-20 | Disposition: A | Discharge status: Home / Self Care | Payer: Medicaid Other | Source: Ambulatory Visit | Attending: Internal Medicine | Admitting: Internal Medicine

## 2021-10-20 DIAGNOSIS — C719 Malignant neoplasm of brain, unspecified: Secondary | ICD-10-CM | POA: Insufficient documentation

## 2021-10-20 MED ORDER — GADOBUTROL 1 MMOL/ML IV SOLN
7.0000 mL | Freq: Once | INTRAVENOUS | Status: AC | PRN
  Administered 2021-10-20: 7 mL via INTRAVENOUS

## 2021-10-29 ENCOUNTER — Inpatient Hospital Stay: Payer: Medicaid Other

## 2021-11-01 ENCOUNTER — Other Ambulatory Visit: Payer: Self-pay

## 2021-11-01 ENCOUNTER — Inpatient Hospital Stay (HOSPITAL_BASED_OUTPATIENT_CLINIC_OR_DEPARTMENT_OTHER): Payer: Medicaid Other | Admitting: Internal Medicine

## 2021-11-01 ENCOUNTER — Other Ambulatory Visit (HOSPITAL_COMMUNITY): Payer: Self-pay

## 2021-11-01 ENCOUNTER — Inpatient Hospital Stay: Payer: Medicaid Other

## 2021-11-01 ENCOUNTER — Other Ambulatory Visit: Payer: Self-pay | Admitting: Pharmacist

## 2021-11-01 VITALS — BP 130/76 | HR 67 | Temp 98.1°F | Resp 16 | Wt 156.0 lb

## 2021-11-01 DIAGNOSIS — C719 Malignant neoplasm of brain, unspecified: Secondary | ICD-10-CM | POA: Diagnosis not present

## 2021-11-01 DIAGNOSIS — Z5111 Encounter for antineoplastic chemotherapy: Secondary | ICD-10-CM | POA: Diagnosis not present

## 2021-11-01 DIAGNOSIS — R569 Unspecified convulsions: Secondary | ICD-10-CM | POA: Diagnosis not present

## 2021-11-01 LAB — CBC WITH DIFFERENTIAL (CANCER CENTER ONLY)
Abs Immature Granulocytes: 0.01 10*3/uL (ref 0.00–0.07)
Basophils Absolute: 0 10*3/uL (ref 0.0–0.1)
Basophils Relative: 0 %
Eosinophils Absolute: 0.1 10*3/uL (ref 0.0–0.5)
Eosinophils Relative: 2 %
HCT: 47.3 % (ref 39.0–52.0)
Hemoglobin: 16.2 g/dL (ref 13.0–17.0)
Immature Granulocytes: 0 %
Lymphocytes Relative: 15 %
Lymphs Abs: 0.9 10*3/uL (ref 0.7–4.0)
MCH: 29 pg (ref 26.0–34.0)
MCHC: 34.2 g/dL (ref 30.0–36.0)
MCV: 84.8 fL (ref 80.0–100.0)
Monocytes Absolute: 0.5 10*3/uL (ref 0.1–1.0)
Monocytes Relative: 8 %
Neutro Abs: 4.3 10*3/uL (ref 1.7–7.7)
Neutrophils Relative %: 75 %
Platelet Count: 175 10*3/uL (ref 150–400)
RBC: 5.58 MIL/uL (ref 4.22–5.81)
RDW: 13.2 % (ref 11.5–15.5)
WBC Count: 5.8 10*3/uL (ref 4.0–10.5)
nRBC: 0 % (ref 0.0–0.2)

## 2021-11-01 LAB — CMP (CANCER CENTER ONLY)
ALT: 15 U/L (ref 0–44)
AST: 21 U/L (ref 15–41)
Albumin: 4.5 g/dL (ref 3.5–5.0)
Alkaline Phosphatase: 58 U/L (ref 38–126)
Anion gap: 6 (ref 5–15)
BUN: 8 mg/dL (ref 6–20)
CO2: 29 mmol/L (ref 22–32)
Calcium: 10.1 mg/dL (ref 8.9–10.3)
Chloride: 103 mmol/L (ref 98–111)
Creatinine: 0.77 mg/dL (ref 0.61–1.24)
GFR, Estimated: >60 mL/min (ref >60)
Glucose, Bld: 83 mg/dL (ref 70–99)
Potassium: 4.3 mmol/L (ref 3.5–5.1)
Sodium: 138 mmol/L (ref 135–145)
Total Bilirubin: 0.6 mg/dL (ref 0.3–1.2)
Total Protein: 7.4 g/dL (ref 6.5–8.1)

## 2021-11-01 LAB — TOTAL PROTEIN, URINE DIPSTICK: Protein, ur: NEGATIVE mg/dL

## 2021-11-01 MED ORDER — LOMUSTINE 10 MG PO CAPS: 20.0000 mg | ORAL_CAPSULE | Freq: Once | ORAL | 0 refills | Status: AC

## 2021-11-01 MED ORDER — LOMUSTINE 100 MG PO CAPS
100.0000 mg | ORAL_CAPSULE | Freq: Once | ORAL | 0 refills | Status: DC
  Filled 2021-11-01: qty 1, 1d supply, fill #0

## 2021-11-01 MED ORDER — SODIUM CHLORIDE 0.9 % IV SOLN: Freq: Once | INTRAVENOUS | Status: AC

## 2021-11-01 MED ORDER — LOMUSTINE 10 MG PO CAPS
20.0000 mg | ORAL_CAPSULE | Freq: Once | ORAL | 0 refills | Status: DC
  Filled 2021-11-01: qty 2, 1d supply, fill #0

## 2021-11-01 MED ORDER — LOMUSTINE 40 MG PO CAPS
40.0000 mg | ORAL_CAPSULE | Freq: Once | ORAL | 0 refills | Status: DC
  Filled 2021-11-01: qty 1, 1d supply, fill #0

## 2021-11-01 MED ORDER — LOMUSTINE 100 MG PO CAPS: 100.0000 mg | ORAL_CAPSULE | Freq: Once | ORAL | 0 refills | Status: AC

## 2021-11-01 MED ORDER — LOMUSTINE 40 MG PO CAPS: 40.0000 mg | ORAL_CAPSULE | Freq: Once | ORAL | 0 refills | Status: AC

## 2021-11-01 MED ORDER — SODIUM CHLORIDE 0.9 % IV SOLN
10.0000 mg/kg | Freq: Once | INTRAVENOUS | Status: AC
  Administered 2021-11-01: 700 mg via INTRAVENOUS
  Filled 2021-11-01: qty 16

## 2021-11-01 NOTE — Progress Notes (Signed)
? ?[facility] [address]  ?[place], [place] [address] ?[phone] ? ? ?Interval Evaluation ? ?Date of Service: 11/01/21 ?[name] ?Patient MRN: [id number] ?Patient DOB: [birth year] ?Provider: Ventura Sellers, MD ? ?Identifying Statement:  ?Cosby Proby is a 61 y.o. male with left temporal glioblastoma  ? ?Oncologic History: ?Oncology History  ?Glioblastoma, IDH-wildtype ([place])  ?06/15/2020 Surgery  ? Stereotactic biopsy, L temporal [name]).  Path demonstrates glioblastoma IDH-1 wt ?  ?08/02/2020 Surgery  ? Debulking craniotomy with Dr. [name] ?  ?09/05/2020 - 10/17/2020 Radiation Therapy  ? IMRT with concurrent Temozolomide [name]) ?  ?11/19/2020 -  Chemotherapy  ? Initiates cycle #1 adjuvant Temozolomide ? ?  ? ?  ?07/17/2021 -  Chemotherapy  ? Patient is on Treatment Plan : BRAIN Lomustine q42d  ?   ?07/26/2021 -  Chemotherapy  ? Patient is on Treatment Plan : BRAIN GBM Bevacizumab 14d x 6 cycles  ?   ? ? ?Biomarkers: ? ?MGMT Unknown.  ?IDH 1/2 Wild type.  ?EGFR Unknown  ?TERT "Mutated  ? ?Interval History: ? Teryn Gust presents today for follow up for avastin infusion, now having completed cycle #2 of CCNU.  Denies new or progressive deficits.  No balance issues, motor dysfunction.  Remains off decadron.  Continues on Keppra as prior. ? ?H+P (07/24/20) Patient presented to medical attention in late October, 2021 with new onset seizure.  Event was decribed as loss of consciousness, sudden, without clarity on further details aside from altered mental status upon awakening.  CNS imaging demonsrated non-enhancing mass within left temporal lobe c/w likely glioma; he underwent stereotactic biopsy with Dr. [name] on 06/15/20.  There was significant delay in finalizing path, explaining delay in follow up and evaluation.  He denies any seizures since discharge from hospital, on 4 anti-seizure drugs.  He does complain of dizziness and drowsiness with the  medications, however.  Also describes impaired short term memory.  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   ? ?Medications: ?Current Outpatient Medications on File Prior to Visit  ?Medication Sig Dispense Refill  ? Carboxymethylcellul-Glycerin (LUBRICATING EYE DROPS OP) Place 1 drop into both eyes daily as needed (dry eyes).    ? levETIRAcetam (KEPPRA) 750 MG tablet Take 1 tablet (750 mg total) by mouth 2 (two) times daily. 60 tablet 3  ? ondansetron (ZOFRAN) 8 MG tablet Take 1 tablet (8 mg total) by mouth 2 (two) times daily as needed. Start on the third day after chemotherapy. 30 tablet 1  ? [DISCONTINUED] lacosamide (VIMPAT) 200 MG TABS tablet Take 0.5 tablets (100 mg total) by mouth 2 (two) times daily. 30 tablet 0  ? ?No current facility-administered medications on file prior to visit.  ? ? ?Allergies: No Known Allergies ?Past Medical History:  ?Past Medical History:  ?Diagnosis Date  ? Cancer [facility])   ? BRAIN TUMOR  ? Headache   ? Seizure ([place])   ? ?Past Surgical History:  ?Past Surgical History:  ?Procedure Laterality Date  ? APPLICATION OF CRANIAL NAVIGATION N/A 06/15/2020  ? Procedure: APPLICATION OF CRANIAL NAVIGATION;  Surgeon: [name], MD;  Location: [facility];  Service: Neurosurgery;  Laterality: N/A;  ? APPLICATION OF CRANIAL NAVIGATION N/A 08/02/2020  ? Procedure: APPLICATION OF CRANIAL NAVIGATION;  Surgeon: [name], MD;  Location: [facility];  Service: Neurosurgery;  Laterality: N/A;  ? CRANIOTOMY N/A 08/02/2020  ? Procedure: CRANIOTOMY LEFT TEMPORAL LOBECTOMY FOR TUMOR EXCISION;  Surgeon: [name],  Dorcas Carrow, MD;  Location: [facility];  Service: Neurosurgery;  Laterality: N/A;  ? FRAMELESS  BIOPSY WITH BRAINLAB Left 06/15/2020  ? Procedure: Left temporal lobe stereotactic brain biopsy with brainlab;  Surgeon: [name], MD;  Location: [facility];  Service: Neurosurgery;  Laterality: Left;  ? ?Social History:  ?Social History  ? ?Socioeconomic History  ? Marital status: Married  ?   Spouse name: Not on file  ? Number of children: Not on file  ? Years of education: Not on file  ? Highest education level: Not on file  ?Occupational History  ? Not on file  ?Tobacco Use  ? Smoking status: Never  ? Smokeless tobacco: Never  ?Substance and Sexual Activity  ? Alcohol use: Never  ? Drug use: Not on file  ? Sexual activity: Yes  ?  Partners: Female  ?Other Topics Concern  ? Not on file  ?Social History Narrative  ? Not on file  ? ?Social Determinants of Health  ? ?Financial Resource Strain: Not on file  ?Food Insecurity: Not on file  ?Transportation Needs: Not on file  ?Physical Activity: Not on file  ?Stress: Not on file  ?Social Connections: Not on file  ?Intimate Partner Violence: Not on file  ? ?Family History:  ?Family History  ?Problem Relation Age of Onset  ? Cancer Neg Hx   ? ? ?Review of Systems: ?Constitutional: Doesn't report fevers, chills or abnormal weight loss ?Eyes: Doesn't report blurriness of vision ?Ears, nose, mouth, throat, and face: Doesn't report sore throat ?Respiratory: Doesn't report cough, dyspnea or wheezes ?Cardiovascular: Doesn't report palpitation, chest discomfort  ?Gastrointestinal:  Doesn't report nausea, constipation, diarrhea ?GU: Doesn't report incontinence ?Skin: Doesn't report skin rashes ?Neurological: Per HPI ?Musculoskeletal: Doesn't report joint pain ?Behavioral/Psych: Doesn't report anxiety ? ?Physical Exam: ?Vitals:  ? 11/01/21 1159  ?BP: 130/76  ?Pulse: 67  ?Resp: 16  ?Temp: 98.1 ?F (36.7 ?C)  ?SpO2: 99%  ? ? ?KPS: 90. ?General: Alert, cooperative, pleasant, in no acute distress ?Head: Normal ?EENT: No conjunctival injection or scleral icterus.  ?Lungs: Resp effort normal ?Cardiac: Regular rate ?Abdomen: Non-distended abdomen ?Skin: No rashes cyanosis or petechiae. ?Extremities: No clubbing or edema ? ?Neurologic Exam: ?Mental Status: Awake, alert, attentive to examiner. Oriented to self and environment. Language is fluent with intact comprehension.   ?Cranial Nerves: Visual acuity is grossly normal. Visual fields are full. Extra-ocular movements intact. No ptosis. Face is symmetric ?Motor: Tone and bulk are normal. Power is full in both arms and legs. Reflexes are symmetric, no pathologic reflexes present.  ?Sensory: Intact to light touch ?Gait: Normal. ? ? ?Labs: ?I have reviewed the data as listed ?   ?Component Value Date/Time  ? NA 138 11/01/2021 1119  ? K 4.3 11/01/2021 1119  ? CL 103 11/01/2021 1119  ? CO2 29 11/01/2021 1119  ? GLUCOSE 83 11/01/2021 1119  ? BUN 8 11/01/2021 1119  ? CREATININE 0.77 11/01/2021 1119  ? CALCIUM 10.1 11/01/2021 1119  ? PROT 7.4 11/01/2021 1119  ? ALBUMIN 4.5 11/01/2021 1119  ? AST 21 11/01/2021 1119  ? ALT 15 11/01/2021 1119  ? ALKPHOS 58 11/01/2021 1119  ? BILITOT 0.6 11/01/2021 1119  ? GFRNONAA >60 11/01/2021 1119  ? ?Lab Results  ?Component Value Date  ? WBC 5.8 11/01/2021  ? NEUTROABS 4.3 11/01/2021  ? HGB 16.2 11/01/2021  ? HCT 47.3 11/01/2021  ? MCV 84.8 11/01/2021  ? PLT 175 11/01/2021  ? ?Imaging: ? ?[facility] Clinician Interpretation: I have  personally reviewed the CNS images as listed.  My interpretation, in the context of the patient's clinical presentation, is stable disease ? ?MR BRAIN W WO CONTRAST ? ?Result Date: 10/21/2021 ?CLINICAL DATA:  61 year old male with IDH-wildtype glioblastoma with imaging evidence of progression in December, with subsequent transition to CCNU/Avastin therapy at that time. Imaging improvement in January. EXAM: MRI HEAD WITHOUT AND WITH CONTRAST TECHNIQUE: Multiplanar, multiecho pulse sequences of the brain and surrounding structures were obtained without and with intravenous contrast. CONTRAST:  36mL GADAVIST GADOBUTROL 1 MMOL/ML IV SOLN COMPARISON:  08/31/2021 and earlier. FINDINGS: Brain: Anterior left temporal lobe resection cavity is stable. Regional T2 and FLAIR heterogeneity and regional masslike brain expansion remains significantly regressed compared to December, and unchanged  compared to January. Stable mild hemosiderin or mineralization in the treatment area. No midline shift or intracranial mass effect now. Following contrast indistinct, irregular postcontrast enhancement tracking posteriorl

## 2021-11-01 NOTE — Patient Instructions (Signed)
[facility] MEDICAL [facility]COLOG[facility]  Discharge Instructions: °Thank you for choosing [facility] to provide your oncology and hematology care.  ° °If you have a lab appointment with the Cancer Center, please go directly to the Cancer Center and check in at the registration area. °  °Wear comfortable clothing and clothing appropriate for easy access to any Portacath or PICC line.  ° °We strive to give you quality time with your provider. You may need to reschedule your appointment if you arrive late (15 or more minutes).  Arriving late affects you and other patients whose appointments are after yours.  Also, if you miss three or more appointments without notifying the office, you may be dismissed from the clinic at the provider’s discretion.    °  °For prescription refill requests, have your pharmacy contact our office and allow 72 hours for refills to be completed.   ° °Today you received the following chemotherapy and/or immunotherapy agents: Bevacizumab.     °  °To help prevent nausea and vomiting after your treatment, we encourage you to take your nausea medication as directed. ° °BELOW ARE SYMPTOMS THAT SHOULD BE REPORTED IMMEDIATELY: °*FEVER GREATER THAN 100.4 F (38 °C) OR HIGHER °*CHILLS OR SWEATING °*NAUSEA AND VOMITING THAT IS NOT CONTROLLED WITH YOUR NAUSEA MEDICATION °*UNUSUAL SHORTNESS OF BREATH °*UNUSUAL BRUISING OR BLEEDING °*URINARY PROBLEMS (pain or burning when urinating, or frequent urination) °*BOWEL PROBLEMS (unusual diarrhea, constipation, pain near the anus) °TENDERNESS IN MOUTH AND THROAT WITH OR WITHOUT PRESENCE OF ULCERS (sore throat, sores in mouth, or a toothache) °UNUSUAL RASH, SWELLING OR PAIN  °UNUSUAL VAGINAL DISCHARGE OR ITCHING  ° °Items with * indicate a potential emergency and should be followed up as soon as possible or go to the Emergency Department if any problems should occur. ° °Please show the CHEMOTHERAPY ALERT CARD or IMMUNOTHERAPY ALERT CARD at check-in  to the Emergency Department and triage nurse. ° °Should you have questions after your visit or need to cancel or reschedule your appointment, please contact [facility]  Dept: [phone]  and follow the prompts.  Office hours are 8:00 a.m. to 4:30 p.m. Monday - Friday. Please note that voicemails left after 4:00 p.m. may not be returned until the following business day.  We are closed weekends and major holidays. You have access to a nurse at all times for urgent questions. Please call the main number to the clinic Dept: [phone] and follow the prompts. ° ° °For any non-urgent questions, you may also contact your provider using MyChart. We now offer e-Visits for anyone 18 and older to request care online for non-urgent symptoms. For details visit [email or web address]. °  °Also download the MyChart app! Go to the app store, search "MyChart", open the app, select , and log in with your MyChart username and password. ° °Due to Covid, a mask is required upon entering the hospital/clinic. If you do not have a mask, one will be given to you upon arrival. For doctor visits, patients may have 1 support person aged 18 or older with them. For treatment visits, patients cannot have anyone with them due to current Covid guidelines and our immunocompromised population.  ° °

## 2021-11-01 NOTE — Progress Notes (Signed)
Oral Oncology Pharmacist Encounter ? ?Prescription refill for CCNU (lomustine) sent to [facility] in error. Patient enrolled in manufacturer assistance and receives medication through [facility]. Prescription redirected to [facility] for dispensing. ? ?Leron Croak, PharmD, BCPS ?Hematology/Oncology Clinical Pharmacist ?[facility] ?[phone] ?11/01/2021 1:10 PM ? ? ? ?

## 2021-11-01 NOTE — Progress Notes (Signed)
Patients labs and vitals have been reviewed and are within approved limits to proceed with Avastin treatment today 11/01/2021 per Dr Mickeal Skinner. ?

## 2021-11-02 ENCOUNTER — Telehealth: Payer: Self-pay | Admitting: Internal Medicine

## 2021-11-02 NOTE — Telephone Encounter (Signed)
Scheduled per 3/23 los, pt has been called and confirmed appt via interpreter ?

## 2021-11-06 ENCOUNTER — Telehealth: Payer: Self-pay | Admitting: Pharmacist

## 2021-11-06 NOTE — Telephone Encounter (Signed)
Oral Chemotherapy Pharmacist Encounter  ? ?Spoke with patient's wife, Patrecia Pour with use of intrepreter Thurmond Butts ID: [phone]), today to follow up regarding patient's oral chemotherapy medication: Gleostine (lomustine) ? ?Medication delivered to the [facility] today (11/06/21). Notified patient's wife that it is ready to be picked up from the cancer center this week. She expressed appreciation and understanding and states patient will be by this week to pick up next cycle of Gleostine.  ? ?Patient and patient's family know to call the office with questions or concerns. ? ?Leron Croak, PharmD, BCPS ?Hematology/Oncology Clinical Pharmacist ?[facility] Oral Chemotherapy Navigation Clin[facility] ?[phone] ?11/06/2021 12:53 PM ? ?

## 2021-11-08 ENCOUNTER — Inpatient Hospital Stay: Payer: Medicaid Other | Admitting: Internal Medicine

## 2021-11-09 NOTE — Telephone Encounter (Addendum)
Oral Chemotherapy Pharmacist Encounter  ?  ?Spoke with patient's wife, Patrecia Pour with use of intrepreter Caryl Comes ID: [phone]), today to follow up regarding patient's oral chemotherapy medication: Gleostine (lomustine) ? ?Reminded patient's wife that medication is still ready to be picked up at the [facility]. She stated that she and her husband will come today (11/09/21) before 4PM to pick up medication. ? ?No other questions or concerns at this time.  ? ?ADDENDUM  ?Patient picked Gleostine from office. He will take his 3rd cycle of Gleostine tonight (11/09/21). ? ?Leron Croak, PharmD, BCPS ?Hematology/Oncology Clinical Pharmacist ?[facility] Oral Chemotherapy Navigation Clinics ?[phone] ?11/09/2021 9:56 AM ? ? ? ?

## 2021-11-15 ENCOUNTER — Inpatient Hospital Stay (HOSPITAL_BASED_OUTPATIENT_CLINIC_OR_DEPARTMENT_OTHER): Payer: Medicaid Other | Admitting: Internal Medicine

## 2021-11-15 ENCOUNTER — Other Ambulatory Visit: Payer: Self-pay

## 2021-11-15 ENCOUNTER — Inpatient Hospital Stay: Payer: Medicaid Other

## 2021-11-15 ENCOUNTER — Inpatient Hospital Stay: Payer: Medicaid Other | Attending: Internal Medicine

## 2021-11-15 VITALS — BP 126/74 | HR 63 | Temp 97.5°F | Resp 20 | Wt 158.1 lb

## 2021-11-15 DIAGNOSIS — R569 Unspecified convulsions: Secondary | ICD-10-CM

## 2021-11-15 DIAGNOSIS — C719 Malignant neoplasm of brain, unspecified: Secondary | ICD-10-CM

## 2021-11-15 DIAGNOSIS — C712 Malignant neoplasm of temporal lobe: Secondary | ICD-10-CM | POA: Insufficient documentation

## 2021-11-15 DIAGNOSIS — Z9221 Personal history of antineoplastic chemotherapy: Secondary | ICD-10-CM | POA: Insufficient documentation

## 2021-11-15 DIAGNOSIS — Z79899 Other long term (current) drug therapy: Secondary | ICD-10-CM | POA: Diagnosis not present

## 2021-11-15 DIAGNOSIS — Z923 Personal history of irradiation: Secondary | ICD-10-CM | POA: Insufficient documentation

## 2021-11-15 DIAGNOSIS — Z5111 Encounter for antineoplastic chemotherapy: Secondary | ICD-10-CM | POA: Diagnosis not present

## 2021-11-15 LAB — CBC WITH DIFFERENTIAL (CANCER CENTER ONLY)
Abs Immature Granulocytes: 0 10*3/uL (ref 0.00–0.07)
Basophils Absolute: 0 10*3/uL (ref 0.0–0.1)
Basophils Relative: 0 %
Eosinophils Absolute: 0 10*3/uL (ref 0.0–0.5)
Eosinophils Relative: 1 %
HCT: 42.7 % (ref 39.0–52.0)
Hemoglobin: 14.6 g/dL (ref 13.0–17.0)
Immature Granulocytes: 0 %
Lymphocytes Relative: 22 %
Lymphs Abs: 0.9 10*3/uL (ref 0.7–4.0)
MCH: 28.9 pg (ref 26.0–34.0)
MCHC: 34.2 g/dL (ref 30.0–36.0)
MCV: 84.4 fL (ref 80.0–100.0)
Monocytes Absolute: 0.3 10*3/uL (ref 0.1–1.0)
Monocytes Relative: 8 %
Neutro Abs: 2.6 10*3/uL (ref 1.7–7.7)
Neutrophils Relative %: 69 %
Platelet Count: 205 10*3/uL (ref 150–400)
RBC: 5.06 MIL/uL (ref 4.22–5.81)
RDW: 13 % (ref 11.5–15.5)
WBC Count: 3.8 10*3/uL — ABNORMAL LOW (ref 4.0–10.5)
nRBC: 0 % (ref 0.0–0.2)

## 2021-11-15 LAB — TOTAL PROTEIN, URINE DIPSTICK: Protein, ur: NEGATIVE mg/dL

## 2021-11-15 LAB — CMP (CANCER CENTER ONLY)
ALT: 12 U/L (ref 0–44)
AST: 17 U/L (ref 15–41)
Albumin: 4.2 g/dL (ref 3.5–5.0)
Alkaline Phosphatase: 58 U/L (ref 38–126)
Anion gap: 5 (ref 5–15)
BUN: 12 mg/dL (ref 6–20)
CO2: 28 mmol/L (ref 22–32)
Calcium: 9.4 mg/dL (ref 8.9–10.3)
Chloride: 104 mmol/L (ref 98–111)
Creatinine: 0.74 mg/dL (ref 0.61–1.24)
GFR, Estimated: >60 mL/min (ref >60)
Glucose, Bld: 113 mg/dL — ABNORMAL HIGH (ref 70–99)
Potassium: 4.4 mmol/L (ref 3.5–5.1)
Sodium: 137 mmol/L (ref 135–145)
Total Bilirubin: 0.6 mg/dL (ref 0.3–1.2)
Total Protein: 6.9 g/dL (ref 6.5–8.1)

## 2021-11-15 MED ORDER — SODIUM CHLORIDE 0.9 % IV SOLN: Freq: Once | INTRAVENOUS | Status: AC

## 2021-11-15 MED ORDER — SODIUM CHLORIDE 0.9 % IV SOLN
10.0000 mg/kg | Freq: Once | INTRAVENOUS | Status: AC
  Administered 2021-11-15: 700 mg via INTRAVENOUS
  Filled 2021-11-15: qty 16

## 2021-11-15 NOTE — Patient Instructions (Signed)
[name]ICAL [facility]COLOG[facility]  Discharge Instructions: °Thank you for choosing [facility] to provide your oncology and hematology care.  ° °If you have a lab appointment with the Cancer Center, please go directly to the Cancer Center and check in at the registration area. °  °Wear comfortable clothing and clothing appropriate for easy access to any Portacath or PICC line.  ° °We strive to give you quality time with your provider. You may need to reschedule your appointment if you arrive late (15 or more minutes).  Arriving late affects you and other patients whose appointments are after yours.  Also, if you miss three or more appointments without notifying the office, you may be dismissed from the clinic at the provider’s discretion.    °  °For prescription refill requests, have your pharmacy contact our office and allow 72 hours for refills to be completed.   ° °Today you received the following chemotherapy and/or immunotherapy agents: Bevacizumab.     °  °To help prevent nausea and vomiting after your treatment, we encourage you to take your nausea medication as directed. ° °BELOW ARE SYMPTOMS THAT SHOULD BE REPORTED IMMEDIATELY: °*FEVER GREATER THAN 100.4 F (38 °C) OR HIGHER °*CHILLS OR SWEATING °*NAUSEA AND VOMITING THAT IS NOT CONTROLLED WITH YOUR NAUSEA MEDICATION °*UNUSUAL SHORTNESS OF BREATH °*UNUSUAL BRUISING OR BLEEDING °*URINARY PROBLEMS (pain or burning when urinating, or frequent urination) °*BOWEL PROBLEMS (unusual diarrhea, constipation, pain near the anus) °TENDERNESS IN MOUTH AND THROAT WITH OR WITHOUT PRESENCE OF ULCERS (sore throat, sores in mouth, or a toothache) °UNUSUAL RASH, SWELLING OR PAIN  °UNUSUAL VAGINAL DISCHARGE OR ITCHING  ° °Items with * indicate a potential emergency and should be followed up as soon as possible or go to the Emergency Department if any problems should occur. ° °Please show the CHEMOTHERAPY ALERT CARD or IMMUNOTHERAPY ALERT CARD at check-in  to the Emergency Department and triage nurse. ° °Should you have questions after your visit or need to cancel or reschedule your appointment, please contact [facility]ICAL [facility]COL[facility]  Dept: [phone]  and follow the prompts.  Office hours are 8:00 a.m. to 4:30 p.m. Monday - Friday. Please note that voicemails left after 4:00 p.m. may not be returned until the following business day.  We are closed weekends and major holidays. You have access to a nurse at all times for urgent questions. Please call the main number to the clinic Dept: [phone] and follow the prompts. ° ° °For any non-urgent questions, you may also contact your provider using MyChart. We now offer e-Visits for anyone 18 and older to request care online for non-urgent symptoms. For details visit [email or web address]. °  °Also download the MyChart app! Go to the app store, search "MyChart", open the app, select [facility], and log in with your MyChart username and password. ° °Due to Covid, a mask is required upon entering the hospital/clinic. If you do not have a mask, one will be given to you upon arrival. For doctor visits, patients may have 1 support person aged 18 or older with them. For treatment visits, patients cannot have anyone with them due to current Covid guidelines and our immunocompromised population.  ° °

## 2021-11-15 NOTE — Progress Notes (Signed)
Patients labs (UP and CBC) and vitals have been reviewed and Dr Mickeal Skinner is agreeable with patient getting his Avastin infusion today 11/15/2021.   ?

## 2021-11-15 NOTE — Progress Notes (Signed)
? ?[facility]  ?[place], [place] [address] ?[phone]) [phone] ? ? ?Interval Evaluation ? ?Date of Service: 11/15/21 ?[name] ?Patient MRN: [id number] ?Patient DOB: 1961/01/27 ?Provider: Ventura Sellers, MD ? ?Identifying Statement:  ?Rigby Leonhardt is a 61 y.o. male with left temporal glioblastoma  ? ?Oncologic History: ?Oncology History  ?Glioblastoma, IDH-wildtype ([place])  ?06/15/2020 Surgery  ? Stereotactic biopsy, L temporal [name]).  Path demonstrates glioblastoma IDH-1 wt ?  ?08/02/2020 Surgery  ? Debulking craniotomy with Dr. [name] ?  ?09/05/2020 - 10/17/2020 Radiation Therapy  ? IMRT with concurrent Temozolomide [name]) ?  ?11/19/2020 -  Chemotherapy  ? Initiates cycle #1 adjuvant Temozolomide ? ?  ? ?  ?07/17/2021 -  Chemotherapy  ? Patient is on Treatment Plan : BRAIN Lomustine q42d  ?   ?07/26/2021 -  Chemotherapy  ? Patient is on Treatment Plan : BRAIN GBM Bevacizumab 14d x 6 cycles  ?   ? ? ?Biomarkers: ? ?MGMT Unknown.  ?IDH 1/2 Wild type.  ?EGFR Unknown  ?TERT "Mutated  ? ?Interval History: ? Zackeriah Kissler presents today for follow up for avastin infusion; he dosed cycle #3 of CCNU starting 11/09/21.  Denies new or progressive deficits.  No balance issues, motor dysfunction.  Remains off decadron.  Continues on Keppra as prior. ? ?H+P (07/24/20) Patient presented to medical attention in late October, 2021 with new onset seizure.  Event was decribed as loss of consciousness, sudden, without clarity on further details aside from altered mental status upon awakening.  CNS imaging demonsrated non-enhancing mass within left temporal lobe c/w likely glioma; he underwent stereotactic biopsy with Dr. [name] on 06/15/20.  There was significant delay in finalizing path, explaining delay in follow up and evaluation.  He denies any seizures since discharge from hospital, on 4 anti-seizure drugs.  He does complain of dizziness and drowsiness with the  medications, however.  Also describes impaired short term memory.  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   ? ?Medications: ?Current Outpatient Medications on File Prior to Visit  ?Medication Sig Dispense Refill  ? Carboxymethylcellul-Glycerin (LUBRICATING EYE DROPS OP) Place 1 drop into both eyes daily as needed (dry eyes).    ? levETIRAcetam (KEPPRA) 750 MG tablet Take 1 tablet (750 mg total) by mouth 2 (two) times daily. 60 tablet 3  ? ondansetron (ZOFRAN) 8 MG tablet Take 1 tablet (8 mg total) by mouth 2 (two) times daily as needed. Start on the third day after chemotherapy. 30 tablet 1  ? [DISCONTINUED] lacosamide (VIMPAT) 200 MG TABS tablet Take 0.5 tablets (100 mg total) by mouth 2 (two) times daily. 30 tablet 0  ? ?No current facility-administered medications on file prior to visit.  ? ? ?Allergies: No Known Allergies ?Past Medical History:  ?Past Medical History:  ?Diagnosis Date  ? Cancer [facility])   ? BRAIN TUMOR  ? Headache   ? Seizure ([place])   ? ?Past Surgical History:  ?Past Surgical History:  ?Procedure Laterality Date  ? APPLICATION OF CRANIAL NAVIGATION N/A 06/15/2020  ? Procedure: APPLICATION OF CRANIAL NAVIGATION;  Surgeon: [name], MD;  Location: [place];  Service: Neurosurgery;  Laterality: N/A;  ? APPLICATION OF CRANIAL NAVIGATION N/A 08/02/2020  ? Procedure: APPLICATION OF CRANIAL NAVIGATION;  Surgeon: [name], MD;  Location: [place];  Service: Neurosurgery;  Laterality: N/A;  ? CRANIOTOMY N/A 08/02/2020  ? Procedure: CRANIOTOMY LEFT TEMPORAL LOBECTOMY FOR TUMOR EXCISION;  Surgeon:  [name], MD;  [facility];  Service: Neurosurgery;  Laterality: N/A;  ? FRAMELESS  BIOPSY WITH BRAINLAB Left 06/15/2020  ? Procedure: Left temporal lobe stereotactic brain biopsy with brainlab;  Surgeon: [name], MD;  Location: [place];  Service: Neurosurgery;  Laterality: Left;  ? ?Social History:  ?Social History  ? ?Socioeconomic History  ? Marital status: Married  ?   Spouse name: Not on file  ? Number of children: Not on file  ? Years of education: Not on file  ? Highest education level: Not on file  ?Occupational History  ? Not on file  ?Tobacco Use  ? Smoking status: Never  ? Smokeless tobacco: Never  ?Substance and Sexual Activity  ? Alcohol use: Never  ? Drug use: Not on file  ? Sexual activity: Yes  ?  Partners: Female  ?Other Topics Concern  ? Not on file  ?Social History Narrative  ? Not on file  ? ?Social Determinants of Health  ? ?Financial Resource Strain: Not on file  ?Food Insecurity: Not on file  ?Transportation Needs: Not on file  ?Physical Activity: Not on file  ?Stress: Not on file  ?Social Connections: Not on file  ?Intimate Partner Violence: Not on file  ? ?Family History:  ?Family History  ?Problem Relation Age of Onset  ? Cancer Neg Hx   ? ? ?Review of Systems: ?Constitutional: Doesn't report fevers, chills or abnormal weight loss ?Eyes: Doesn't report blurriness of vision ?Ears, nose, mouth, throat, and face: Doesn't report sore throat ?Respiratory: Doesn't report cough, dyspnea or wheezes ?Cardiovascular: Doesn't report palpitation, chest discomfort  ?Gastrointestinal:  Doesn't report nausea, constipation, diarrhea ?GU: Doesn't report incontinence ?Skin: Doesn't report skin rashes ?Neurological: Per HPI ?Musculoskeletal: Doesn't report joint pain ?Behavioral/Psych: Doesn't report anxiety ? ?Physical Exam: ?Vitals:  ? 11/15/21 1342  ?BP: 126/74  ?Pulse: 63  ?Resp: 20  ?Temp: (!) 97.5 ?F (36.4 ?C)  ?SpO2: 100%  ? ?KPS: 90. ?General: Alert, cooperative, pleasant, in no acute distress ?Head: Normal ?EENT: No conjunctival injection or scleral icterus.  ?Lungs: Resp effort normal ?Cardiac: Regular rate ?Abdomen: Non-distended abdomen ?Skin: No rashes cyanosis or petechiae. ?Extremities: No clubbing or edema ? ?Neurologic Exam: ?Mental Status: Awake, alert, attentive to examiner. Oriented to self and environment. Language is fluent with intact comprehension.   ?Cranial Nerves: Visual acuity is grossly normal. Visual fields are full. Extra-ocular movements intact. No ptosis. Face is symmetric ?Motor: Tone and bulk are normal. Power is full in both arms and legs. Reflexes are symmetric, no pathologic reflexes present.  ?Sensory: Intact to light touch ?Gait: Normal. ? ? ?Labs: ?I have reviewed the data as listed ?   ?Component Value Date/Time  ? NA 138 11/01/2021 1119  ? K 4.3 11/01/2021 1119  ? CL 103 11/01/2021 1119  ? CO2 29 11/01/2021 1119  ? GLUCOSE 83 11/01/2021 1119  ? BUN 8 11/01/2021 1119  ? CREATININE 0.77 11/01/2021 1119  ? CALCIUM 10.1 11/01/2021 1119  ? PROT 7.4 11/01/2021 1119  ? ALBUMIN 4.5 11/01/2021 1119  ? AST 21 11/01/2021 1119  ? ALT 15 11/01/2021 1119  ? ALKPHOS 58 11/01/2021 1119  ? BILITOT 0.6 11/01/2021 1119  ? GFRNONAA >60 11/01/2021 1119  ? ?Lab Results  ?Component Value Date  ? WBC 3.8 (L) 11/15/2021  ? NEUTROABS 2.6 11/15/2021  ? HGB 14.6 11/15/2021  ? HCT 42.7 11/15/2021  ? MCV 84.4 11/15/2021  ? PLT 205 11/15/2021  ? ?Assessment/Plan ?Glioblastoma, IDH-wildtype ([place]) [C71.9] ? ?  Vanessa Gascoigne is clinically stable today, now having started cycle #3 of CCNU/Avastin (day 7/42).  No new or progressive changes. ? ?We recommended continuation of second line therapy with cycle #3 CCNU 68m/m2 PO q6 weeks and Avastin 144mkg IV q2 weeks.   ? ?Chemotherapy should be held for the following: ?? ANC less than 1,000 ?? Platelets less than 100,000 ?? LFT or creatinine greater than 2x ULN ?? If clinical concerns/contraindications develop ? ?Avastin should be held for the following: ?? ANC less than 500 ?? Platelets less than 50,000 ?? LFT or creatinine greater than 2x ULN ?? If clinical concerns/contraindications develop ? ?Keppra will stay at 75040mID. ? ?We ask that EucEdis Huishturn to clinic in 2 weeks for avastin.  Next cycle would be due to dose 12/21/21, with MRI to follow cycle #4. ? ?We appreciate the opportunity to participate in the care of  [place] ? ?All questions were answered. The patient knows to call the clinic with any problems, questions or concerns. No barriers to learning were detected. ? ?The total time spent in the encounter w

## 2021-11-29 ENCOUNTER — Inpatient Hospital Stay (HOSPITAL_BASED_OUTPATIENT_CLINIC_OR_DEPARTMENT_OTHER): Payer: Medicaid Other | Admitting: Internal Medicine

## 2021-11-29 ENCOUNTER — Inpatient Hospital Stay: Payer: Medicaid Other

## 2021-11-29 ENCOUNTER — Other Ambulatory Visit: Payer: Self-pay

## 2021-11-29 VITALS — BP 116/67 | HR 61 | Temp 97.9°F | Resp 20 | Wt 158.4 lb

## 2021-11-29 DIAGNOSIS — C719 Malignant neoplasm of brain, unspecified: Secondary | ICD-10-CM

## 2021-11-29 DIAGNOSIS — R569 Unspecified convulsions: Secondary | ICD-10-CM | POA: Diagnosis not present

## 2021-11-29 DIAGNOSIS — Z5111 Encounter for antineoplastic chemotherapy: Secondary | ICD-10-CM | POA: Diagnosis not present

## 2021-11-29 LAB — CBC WITH DIFFERENTIAL (CANCER CENTER ONLY)
Abs Immature Granulocytes: 0.01 10*3/uL (ref 0.00–0.07)
Basophils Absolute: 0 10*3/uL (ref 0.0–0.1)
Basophils Relative: 0 %
Eosinophils Absolute: 0 10*3/uL (ref 0.0–0.5)
Eosinophils Relative: 1 %
HCT: 42.5 % (ref 39.0–52.0)
Hemoglobin: 15 g/dL (ref 13.0–17.0)
Immature Granulocytes: 0 %
Lymphocytes Relative: 26 %
Lymphs Abs: 0.8 10*3/uL (ref 0.7–4.0)
MCH: 29.9 pg (ref 26.0–34.0)
MCHC: 35.3 g/dL (ref 30.0–36.0)
MCV: 84.8 fL (ref 80.0–100.0)
Monocytes Absolute: 0.5 10*3/uL (ref 0.1–1.0)
Monocytes Relative: 15 %
Neutro Abs: 1.7 10*3/uL (ref 1.7–7.7)
Neutrophils Relative %: 58 %
Platelet Count: 176 10*3/uL (ref 150–400)
RBC: 5.01 MIL/uL (ref 4.22–5.81)
RDW: 13.6 % (ref 11.5–15.5)
WBC Count: 3 10*3/uL — ABNORMAL LOW (ref 4.0–10.5)
nRBC: 0 % (ref 0.0–0.2)

## 2021-11-29 LAB — CMP (CANCER CENTER ONLY)
ALT: 13 U/L (ref 0–44)
AST: 18 U/L (ref 15–41)
Albumin: 4.4 g/dL (ref 3.5–5.0)
Alkaline Phosphatase: 51 U/L (ref 38–126)
Anion gap: 5 (ref 5–15)
BUN: 10 mg/dL (ref 6–20)
CO2: 29 mmol/L (ref 22–32)
Calcium: 9.5 mg/dL (ref 8.9–10.3)
Chloride: 105 mmol/L (ref 98–111)
Creatinine: 0.84 mg/dL (ref 0.61–1.24)
GFR, Estimated: >60 mL/min (ref >60)
Glucose, Bld: 88 mg/dL (ref 70–99)
Potassium: 4.2 mmol/L (ref 3.5–5.1)
Sodium: 139 mmol/L (ref 135–145)
Total Bilirubin: 0.6 mg/dL (ref 0.3–1.2)
Total Protein: 7.2 g/dL (ref 6.5–8.1)

## 2021-11-29 LAB — TOTAL PROTEIN, URINE DIPSTICK: Protein, ur: NEGATIVE mg/dL

## 2021-11-29 MED ORDER — SODIUM CHLORIDE 0.9 % IV SOLN: Freq: Once | INTRAVENOUS | Status: AC

## 2021-11-29 MED ORDER — SODIUM CHLORIDE 0.9 % IV SOLN
10.0000 mg/kg | Freq: Once | INTRAVENOUS | Status: AC
  Administered 2021-11-29: 700 mg via INTRAVENOUS
  Filled 2021-11-29: qty 16

## 2021-11-29 MED ORDER — BACLOFEN 10 MG PO TABS: 10.0000 mg | ORAL_TABLET | Freq: Three times a day (TID) | ORAL | 0 refills | Status: DC | PRN

## 2021-11-29 NOTE — Progress Notes (Signed)
? ?[facility] [place] [address] [address]  ?[place], [place] [address] ?[phone] ? ? ?Interval Evaluation ? ?Date of Service: 11/29/21 ?Hunter Terry ?Hunter MRN: [id number] ?Hunter DOB: Oct 10, 1960 ?Provider: Ventura Sellers, MD ? ?Identifying Statement:  ?Hunter Terry is a 61 y.o. male with left temporal glioblastoma  ? ?Oncologic History: ?Oncology History  ?Glioblastoma, IDH-wildtype ([place])  ?06/15/2020 Surgery  ? Stereotactic biopsy, L temporal [name]).  Path demonstrates glioblastoma IDH-1 wt ?  ?08/02/2020 Surgery  ? Debulking craniotomy with Dr. [name] ?  ?09/05/2020 - 10/17/2020 Radiation Therapy  ? IMRT with concurrent Temozolomide [name]) ?  ?11/19/2020 -  Chemotherapy  ? Initiates cycle #1 adjuvant Temozolomide ? ?  ? ?  ?07/17/2021 -  Chemotherapy  ? Hunter is on Treatment Plan : BRAIN Lomustine q42d  ? ?  ?  ?07/26/2021 -  Chemotherapy  ? Hunter is on Treatment Plan : BRAIN GBM Bevacizumab 14d x 6 cycles  ? ?  ?  ? ? ?Biomarkers: ? ?MGMT Unknown.  ?IDH 1/2 Wild type.  ?EGFR Unknown  ?TERT "Mutated  ? ?Interval History: ? Hunter Terry presents today for follow up for avastin infusion; he dosed cycle #3 of CCNU starting 11/09/21.  No new or progressive changes today.  No balance issues, motor dysfunction.  Continues on Keppra $RemoveB'750mg'bNeWEslW$  BID, no seizures. ? ?H+P (07/24/20) Hunter presented to medical attention in late October, 2021 with new onset seizure.  Event was decribed as loss of consciousness, sudden, without clarity on further details aside from altered mental status upon awakening.  CNS imaging demonsrated non-enhancing mass within left temporal lobe c/w likely glioma; he underwent stereotactic biopsy with Dr. [name] on 06/15/20.  There was significant delay in finalizing path, explaining delay in follow up and evaluation.  He denies any seizures since discharge from hospital, on 4 anti-seizure drugs.  He does complain of dizziness and drowsiness with the  medications, however.  Also describes impaired short term memory.  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   ? ?Medications: ?Current Outpatient Medications on File Prior to Visit  ?Medication Sig Dispense Refill  ? Carboxymethylcellul-Glycerin (LUBRICATING EYE DROPS OP) Place 1 drop into both eyes daily as needed (dry eyes).    ? levETIRAcetam (KEPPRA) 750 MG tablet Take 1 tablet (750 mg total) by mouth 2 (two) times daily. 60 tablet 3  ? ondansetron (ZOFRAN) 8 MG tablet Take 1 tablet (8 mg total) by mouth 2 (two) times daily as needed. Start on the third day after chemotherapy. 30 tablet 1  ? [DISCONTINUED] lacosamide (VIMPAT) 200 MG TABS tablet Take 0.5 tablets (100 mg total) by mouth 2 (two) times daily. 30 tablet 0  ? ?No current facility-administered medications on file prior to visit.  ? ? ?Allergies: No Known Allergies ?Past Medical History:  ?Past Medical History:  ?Diagnosis Date  ? Cancer [facility])   ? BRAIN TUMOR  ? Headache   ? Seizure ([place])   ? ?Past Surgical History:  ?Past Surgical History:  ?Procedure Laterality Date  ? APPLICATION OF CRANIAL NAVIGATION N/A 06/15/2020  ? Procedure: APPLICATION OF CRANIAL NAVIGATION;  Surgeon: [name], MD;  Location: [place];  Service: Neurosurgery;  Laterality: N/A;  ? APPLICATION OF CRANIAL NAVIGATION N/A 08/02/2020  ? Procedure: APPLICATION OF CRANIAL NAVIGATION;  Surgeon: [name], MD;  Location: [place];  Service: Neurosurgery;  Laterality: N/A;  ? CRANIOTOMY N/A 08/02/2020  ? Procedure: CRANIOTOMY LEFT TEMPORAL LOBECTOMY FOR TUMOR  EXCISION;  Surgeon: [name], MD;  Location: [facility];  Service: Neurosurgery;  Laterality: N/A;  ? FRAMELESS  BIOPSY WITH BRAINLAB Left 06/15/2020  ? Procedure: Left temporal lobe stereotactic brain biopsy with brainlab;  Surgeon: [name], MD;  Location: [place];  Service: Neurosurgery;  Laterality: Left;  ? ?Social History:  ?Social History  ? ?Socioeconomic History  ? Marital status: Married  ?   Spouse name: Not on file  ? Number of children: Not on file  ? Years of education: Not on file  ? Highest education level: Not on file  ?Occupational History  ? Not on file  ?Tobacco Use  ? Smoking status: Never  ? Smokeless tobacco: Never  ?Substance and Sexual Activity  ? Alcohol use: Never  ? Drug use: Not on file  ? Sexual activity: Yes  ?  Partners: Female  ?Other Topics Concern  ? Not on file  ?Social History Narrative  ? Not on file  ? ?Social Determinants of Health  ? ?Financial Resource Strain: Not on file  ?Food Insecurity: Not on file  ?Transportation Needs: Not on file  ?Physical Activity: Not on file  ?Stress: Not on file  ?Social Connections: Not on file  ?Intimate Partner Violence: Not on file  ? ?Family History:  ?Family History  ?Problem Relation Age of Onset  ? Cancer Neg Hx   ? ? ?Review of Systems: ?Constitutional: Doesn't report fevers, chills or abnormal weight loss ?Eyes: Doesn't report blurriness of vision ?Ears, nose, mouth, throat, and face: Doesn't report sore throat ?Respiratory: Doesn't report cough, dyspnea or wheezes ?Cardiovascular: Doesn't report palpitation, chest discomfort  ?Gastrointestinal:  Doesn't report nausea, constipation, diarrhea ?GU: Doesn't report incontinence ?Skin: Doesn't report skin rashes ?Neurological: Per HPI ?Musculoskeletal: Doesn't report joint pain ?Behavioral/Psych: Doesn't report anxiety ? ?Physical Exam: ?There were no vitals filed for this visit. ? ?KPS: 90. ?General: Alert, cooperative, pleasant, in no acute distress ?Head: Normal ?EENT: No conjunctival injection or scleral icterus.  ?Lungs: Resp effort normal ?Cardiac: Regular rate ?Abdomen: Non-distended abdomen ?Skin: No rashes cyanosis or petechiae. ?Extremities: No clubbing or edema ? ?Neurologic Exam: ?Mental Status: Awake, alert, attentive to examiner. Oriented to self and environment. Language is fluent with intact comprehension.  ?Cranial Nerves: Visual acuity is grossly normal. Visual fields  are full. Extra-ocular movements intact. No ptosis. Face is symmetric ?Motor: Tone and bulk are normal. Power is full in both arms and legs. Reflexes are symmetric, no pathologic reflexes present.  ?Sensory: Intact to light touch ?Gait: Normal. ? ? ?Labs: ?I have reviewed the data as listed ?   ?Component Value Date/Time  ? NA 137 11/15/2021 1331  ? K 4.4 11/15/2021 1331  ? CL 104 11/15/2021 1331  ? CO2 28 11/15/2021 1331  ? GLUCOSE 113 (H) 11/15/2021 1331  ? BUN 12 11/15/2021 1331  ? CREATININE 0.74 11/15/2021 1331  ? CALCIUM 9.4 11/15/2021 1331  ? PROT 6.9 11/15/2021 1331  ? ALBUMIN 4.2 11/15/2021 1331  ? AST 17 11/15/2021 1331  ? ALT 12 11/15/2021 1331  ? ALKPHOS 58 11/15/2021 1331  ? BILITOT 0.6 11/15/2021 1331  ? GFRNONAA >60 11/15/2021 1331  ? ?Lab Results  ?Component Value Date  ? WBC 3.0 (L) 11/29/2021  ? NEUTROABS 1.7 11/29/2021  ? HGB 15.0 11/29/2021  ? HCT 42.5 11/29/2021  ? MCV 84.8 11/29/2021  ? PLT 176 11/29/2021  ? ?Assessment/Plan ?Glioblastoma, IDH-wildtype ([place]) [C71.9] ? ?Hunter Terry is clinically stable today, now having started cycle #3 of  CCNU/Avastin (day 21/42).  Labs are within normal limits today. ? ?We recommended continuation of second line therapy with cycle #3 CCNU $Remov'90mg'pibtNc$ /m2 PO q6 weeks and Avastin $RemoveBef'10mg'vhNiEoOJgH$ /kg IV q2 weeks.   ? ?Chemotherapy should be held for the following: ?? ANC less than 1,000 ?? Platelets less than 100,000 ?? LFT or creatinine greater than 2x ULN ?? If clinical concerns/contraindications develop ? ?Avastin should be held for the following: ?? ANC less than 500 ?? Platelets less than 50,000 ?? LFT or creatinine greater than 2x ULN ?? If clinical concerns/contraindications develop ? ?Keppra will stay at $Remov'750mg'FRoVWp$  BID. ? ?We ask that Hunter Terry return to clinic in 2 weeks for avastin.  Next cycle would be due to dose 12/21/21, with MRI to follow cycle #4. ? ?We appreciate the opportunity to participate in the care of Hunter Terry.   ? ?All questions were answered. The  Hunter knows to call the clinic with any problems, questions or concerns. No barriers to learning were detected. ? ?The total time spent in the encounter was 30 minutes and more than 50% was on counseling

## 2021-11-29 NOTE — Patient Instructions (Signed)
[facility] MEDICAL [facility]COLOG[facility]  Discharge Instructions: °Thank you for choosing [facility] to provide your oncology and hematology care.  ° °If you have a lab appointment with the Cancer Center, please go directly to the Cancer Center and check in at the registration area. °  °Wear comfortable clothing and clothing appropriate for easy access to any Portacath or PICC line.  ° °We strive to give you quality time with your provider. You may need to reschedule your appointment if you arrive late (15 or more minutes).  Arriving late affects you and other patients whose appointments are after yours.  Also, if you miss three or more appointments without notifying the office, you may be dismissed from the clinic at the provider’s discretion.    °  °For prescription refill requests, have your pharmacy contact our office and allow 72 hours for refills to be completed.   ° °Today you received the following chemotherapy and/or immunotherapy agents: Bevacizumab.     °  °To help prevent nausea and vomiting after your treatment, we encourage you to take your nausea medication as directed. ° °BELOW ARE SYMPTOMS THAT SHOULD BE REPORTED IMMEDIATELY: °*FEVER GREATER THAN 100.4 F (38 °C) OR HIGHER °*CHILLS OR SWEATING °*NAUSEA AND VOMITING THAT IS NOT CONTROLLED WITH YOUR NAUSEA MEDICATION °*UNUSUAL SHORTNESS OF BREATH °*UNUSUAL BRUISING OR BLEEDING °*URINARY PROBLEMS (pain or burning when urinating, or frequent urination) °*BOWEL PROBLEMS (unusual diarrhea, constipation, pain near the anus) °TENDERNESS IN MOUTH AND THROAT WITH OR WITHOUT PRESENCE OF ULCERS (sore throat, sores in mouth, or a toothache) °UNUSUAL RASH, SWELLING OR PAIN  °UNUSUAL VAGINAL DISCHARGE OR ITCHING  ° °Items with * indicate a potential emergency and should be followed up as soon as possible or go to the Emergency Department if any problems should occur. ° °Please show the CHEMOTHERAPY ALERT CARD or IMMUNOTHERAPY ALERT CARD at check-in  to the Emergency Department and triage nurse. ° °Should you have questions after your visit or need to cancel or reschedule your appointment, please contact [facility]  Dept: [phone]  and follow the prompts.  Office hours are 8:00 a.m. to 4:30 p.m. Monday - Friday. Please note that voicemails left after 4:00 p.m. may not be returned until the following business day.  We are closed weekends and major holidays. You have access to a nurse at all times for urgent questions. Please call the main number to the clinic Dept: [phone] and follow the prompts. ° ° °For any non-urgent questions, you may also contact your provider using MyChart. We now offer e-Visits for anyone 18 and older to request care online for non-urgent symptoms. For details visit [email or web address]. °  °Also download the MyChart app! Go to the app store, search "MyChart", open the app, select [place] [facility], and log in with your MyChart username and password. ° °Due to Covid, a mask is required upon entering the hospital/clinic. If you do not have a mask, one will be given to you upon arrival. For doctor visits, patients may have 1 support person aged 18 or older with them. For treatment visits, patients cannot have anyone with them due to current Covid guidelines and our immunocompromised population.  ° °

## 2021-11-29 NOTE — Progress Notes (Signed)
Per Dr Mickeal Skinner patients labs and vital signs are approved for Avastin treatment today 11/29/2021. ?

## 2021-12-13 ENCOUNTER — Inpatient Hospital Stay: Payer: Medicaid Other | Attending: Internal Medicine

## 2021-12-13 ENCOUNTER — Inpatient Hospital Stay (HOSPITAL_BASED_OUTPATIENT_CLINIC_OR_DEPARTMENT_OTHER): Payer: Medicaid Other | Admitting: Internal Medicine

## 2021-12-13 ENCOUNTER — Other Ambulatory Visit: Payer: Self-pay

## 2021-12-13 ENCOUNTER — Inpatient Hospital Stay: Payer: Medicaid Other

## 2021-12-13 VITALS — BP 133/79 | HR 58 | Temp 97.5°F | Resp 20 | Wt 156.5 lb

## 2021-12-13 DIAGNOSIS — R400 Somnolence: Secondary | ICD-10-CM | POA: Diagnosis not present

## 2021-12-13 DIAGNOSIS — R569 Unspecified convulsions: Secondary | ICD-10-CM | POA: Diagnosis not present

## 2021-12-13 DIAGNOSIS — C719 Malignant neoplasm of brain, unspecified: Secondary | ICD-10-CM

## 2021-12-13 DIAGNOSIS — Z5111 Encounter for antineoplastic chemotherapy: Secondary | ICD-10-CM | POA: Insufficient documentation

## 2021-12-13 DIAGNOSIS — Z79899 Other long term (current) drug therapy: Secondary | ICD-10-CM | POA: Diagnosis not present

## 2021-12-13 DIAGNOSIS — C712 Malignant neoplasm of temporal lobe: Secondary | ICD-10-CM | POA: Diagnosis not present

## 2021-12-13 DIAGNOSIS — Z9221 Personal history of antineoplastic chemotherapy: Secondary | ICD-10-CM | POA: Insufficient documentation

## 2021-12-13 DIAGNOSIS — R42 Dizziness and giddiness: Secondary | ICD-10-CM | POA: Insufficient documentation

## 2021-12-13 DIAGNOSIS — Z923 Personal history of irradiation: Secondary | ICD-10-CM | POA: Diagnosis not present

## 2021-12-13 LAB — CBC WITH DIFFERENTIAL (CANCER CENTER ONLY)
Abs Immature Granulocytes: 0.01 10*3/uL (ref 0.00–0.07)
Basophils Absolute: 0 10*3/uL (ref 0.0–0.1)
Basophils Relative: 1 %
Eosinophils Absolute: 0.1 10*3/uL (ref 0.0–0.5)
Eosinophils Relative: 2 %
HCT: 43.3 % (ref 39.0–52.0)
Hemoglobin: 14.8 g/dL (ref 13.0–17.0)
Immature Granulocytes: 0 %
Lymphocytes Relative: 27 %
Lymphs Abs: 1 10*3/uL (ref 0.7–4.0)
MCH: 29.4 pg (ref 26.0–34.0)
MCHC: 34.2 g/dL (ref 30.0–36.0)
MCV: 86.1 fL (ref 80.0–100.0)
Monocytes Absolute: 0.4 10*3/uL (ref 0.1–1.0)
Monocytes Relative: 10 %
Neutro Abs: 2.2 10*3/uL (ref 1.7–7.7)
Neutrophils Relative %: 60 %
Platelet Count: 201 10*3/uL (ref 150–400)
RBC: 5.03 MIL/uL (ref 4.22–5.81)
RDW: 13.7 % (ref 11.5–15.5)
WBC Count: 3.6 10*3/uL — ABNORMAL LOW (ref 4.0–10.5)
nRBC: 0 % (ref 0.0–0.2)

## 2021-12-13 LAB — CMP (CANCER CENTER ONLY)
ALT: 13 U/L (ref 0–44)
AST: 18 U/L (ref 15–41)
Albumin: 4.3 g/dL (ref 3.5–5.0)
Alkaline Phosphatase: 61 U/L (ref 38–126)
Anion gap: 5 (ref 5–15)
BUN: 10 mg/dL (ref 6–20)
CO2: 29 mmol/L (ref 22–32)
Calcium: 9.5 mg/dL (ref 8.9–10.3)
Chloride: 105 mmol/L (ref 98–111)
Creatinine: 0.81 mg/dL (ref 0.61–1.24)
GFR, Estimated: >60 mL/min (ref >60)
Glucose, Bld: 88 mg/dL (ref 70–99)
Potassium: 4.7 mmol/L (ref 3.5–5.1)
Sodium: 139 mmol/L (ref 135–145)
Total Bilirubin: 0.5 mg/dL (ref 0.3–1.2)
Total Protein: 7.1 g/dL (ref 6.5–8.1)

## 2021-12-13 LAB — TOTAL PROTEIN, URINE DIPSTICK: Protein, ur: NEGATIVE mg/dL

## 2021-12-13 MED ORDER — SODIUM CHLORIDE 0.9 % IV SOLN: Freq: Once | INTRAVENOUS | Status: AC

## 2021-12-13 MED ORDER — LOMUSTINE 10 MG PO CAPS: 20.0000 mg | ORAL_CAPSULE | Freq: Once | ORAL | 0 refills | Status: AC

## 2021-12-13 MED ORDER — LOMUSTINE 100 MG PO CAPS: 100.0000 mg | ORAL_CAPSULE | Freq: Once | ORAL | 0 refills | Status: AC

## 2021-12-13 MED ORDER — SODIUM CHLORIDE 0.9 % IV SOLN
10.0000 mg/kg | Freq: Once | INTRAVENOUS | Status: AC
  Administered 2021-12-13: 700 mg via INTRAVENOUS
  Filled 2021-12-13: qty 16

## 2021-12-13 MED ORDER — LOMUSTINE 40 MG PO CAPS: 40.0000 mg | ORAL_CAPSULE | Freq: Once | ORAL | 0 refills | Status: AC

## 2021-12-13 NOTE — Progress Notes (Signed)
? ?[facility] [address]  ?[place], [place] [address] ?[phone] ? ? ?Interval Evaluation ? ?Date of Service: 12/13/21 ?[name] ?Patient MRN: [id number] ?Patient DOB: 1961/01/02 ?Provider: Ventura Sellers, MD ? ?Identifying Statement:  ?Hunter Terry is a 61 y.o. male with left temporal glioblastoma  ? ?Oncologic History: ?Oncology History  ?Glioblastoma, IDH-wildtype ([place])  ?06/15/2020 Surgery  ? Stereotactic biopsy, L temporal Hunter Terry).  Path demonstrates glioblastoma IDH-1 wt ?  ?08/02/2020 Surgery  ? Debulking craniotomy with Dr. Marcello Terry ?  ?09/05/2020 - 10/17/2020 Radiation Therapy  ? IMRT with concurrent Temozolomide Hunter Terry) ?  ?11/19/2020 -  Chemotherapy  ? Initiates cycle #1 adjuvant Temozolomide ? ?  ? ?  ?07/17/2021 -  Chemotherapy  ? Patient is on Treatment Plan : BRAIN Lomustine q42d  ? ?  ?  ?07/26/2021 -  Chemotherapy  ? Patient is on Treatment Plan : BRAIN GBM Bevacizumab 14d x 6 cycles  ? ?  ?  ? ? ?Biomarkers: ? ?MGMT Unknown.  ?IDH 1/2 Wild type.  ?EGFR Unknown  ?TERT "Mutated  ? ?Interval History: ? Hunter Terry presents today for follow up for avastin infusion; he dosed cycle #3 of CCNU starting 11/09/21.  Denies new or progressive deficits.  No balance issues, motor dysfunction.  Continues on Keppra 754m BID, no further seizures. ? ?H+P (07/24/20) Patient presented to medical attention in late October, 2021 with new onset seizure.  Event was decribed as loss of consciousness, sudden, without clarity on further details aside from altered mental status upon awakening.  CNS imaging demonsrated non-enhancing mass within left temporal lobe c/w likely glioma; he underwent stereotactic biopsy with Dr. TMarcello Mooreson 06/15/20.  There was significant delay in finalizing path, explaining delay in follow up and evaluation.  He denies any seizures since discharge from hospital, on 4 anti-seizure drugs.  He does complain of dizziness and drowsiness with  the medications, however.  Also describes impaired short term memory.  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   ? ?Medications: ?Current Outpatient Medications on File Prior to Visit  ?Medication Sig Dispense Refill  ? baclofen (LIORESAL) 10 MG tablet Take 1 tablet (10 mg total) by mouth 3 (three) times daily as needed for muscle spasms. 30 each 0  ? Carboxymethylcellul-Glycerin (LUBRICATING EYE DROPS OP) Place 1 drop into both eyes daily as needed (dry eyes).    ? levETIRAcetam (KEPPRA) 750 MG tablet Take 1 tablet (750 mg total) by mouth 2 (two) times daily. 60 tablet 3  ? ondansetron (ZOFRAN) 8 MG tablet Take 1 tablet (8 mg total) by mouth 2 (two) times daily as needed. Start on the third day after chemotherapy. 30 tablet 1  ? [DISCONTINUED] lacosamide (VIMPAT) 200 MG TABS tablet Take 0.5 tablets (100 mg total) by mouth 2 (two) times daily. 30 tablet 0  ? ?No current facility-administered medications on file prior to visit.  ? ? ?Allergies: No Known Allergies ?Past Medical History:  ?Past Medical History:  ?Diagnosis Date  ? Cancer ([facility]   ? BRAIN TUMOR  ? Headache   ? [place]izure ([facility]   ? ?Past Surgical History:  ?Past Surgical History:  ?Procedure Laterality Date  ? APPLICATION OF CRANIAL NAVIGATION N/A 06/15/2020  ? Procedure: APPLICATION OF CRANIAL NAVIGATION;  Surgeon: TVallarie Mare MD;  [place] [facility]  Service: Neurosurgery;  Laterality: N/A;  ? APPLICATION OF CRANIAL NAVIGATION N/A 08/02/2020  ? Procedure: APPLICATION OF CRANIAL NAVIGATION;  Surgeon: TMarcello Terry  Hunter Carrow, MD;  Location: [facility];  Service: Neurosurgery;  Laterality: N/A;  ? CRANIOTOMY N/A 08/02/2020  ? Procedure: CRANIOTOMY LEFT TEMPORAL LOBECTOMY FOR TUMOR EXCISION;  Surgeon: [name], MD;  Location: [facility];  Service: Neurosurgery;  Laterality: N/A;  ? FRAMELESS  BIOPSY WITH BRAINLAB Left 06/15/2020  ? Procedure: Left temporal lobe stereotactic brain biopsy with brainlab;  Surgeon: [name], MD;  Location: [facility];  Service: Neurosurgery;  Laterality: Left;  ? ?Social History:  ?Social History  ? ?Socioeconomic History  ? Marital status: Married  ?  Spouse name: Not on file  ? Number of children: Not on file  ? Years of education: Not on file  ? Highest education level: Not on file  ?Occupational History  ? Not on file  ?Tobacco Use  ? Smoking status: Never  ? Smokeless tobacco: Never  ?Substance and Sexual Activity  ? Alcohol use: Never  ? Drug use: Not on file  ? Sexual activity: Yes  ?  Partners: Female  ?Other Topics Concern  ? Not on file  ?Social History Narrative  ? Not on file  ? ?Social Determinants of Health  ? ?Financial Resource Strain: Not on file  ?Food Insecurity: Not on file  ?Transportation Needs: Not on file  ?Physical Activity: Not on file  ?Stress: Not on file  ?Social Connections: Not on file  ?Intimate Partner Violence: Not on file  ? ?Family History:  ?Family History  ?Problem Relation Age of Onset  ? Cancer Neg Hx   ? ? ?Review of Systems: ?Constitutional: Doesn't report fevers, chills or abnormal weight loss ?Eyes: Doesn't report blurriness of vision ?Ears, nose, mouth, throat, and face: Doesn't report sore throat ?Respiratory: Doesn't report cough, dyspnea or wheezes ?Cardiovascular: Doesn't report palpitation, chest discomfort  ?Gastrointestinal:  Doesn't report nausea, constipation, diarrhea ?GU: Doesn't report incontinence ?Skin: Doesn't report skin rashes ?Neurological: Per HPI ?Musculoskeletal: Doesn't report joint pain ?Behavioral/Psych: Doesn't report anxiety ? ?Physical Exam: ?Vitals:  ? 12/13/21 1347  ?BP: 133/79  ?Pulse: (!) 58  ?Resp: 20  ?Temp: (!) 97.5 ?F (36.4 ?C)  ?SpO2: 98%  ? ? ?KPS: 90. ?General: Alert, cooperative, pleasant, in no acute distress ?Head: Normal ?EENT: No conjunctival injection or scleral icterus.  ?Lungs: Resp effort normal ?Cardiac: Regular rate ?Abdomen: Non-distended abdomen ?Skin: No rashes cyanosis or petechiae. ?Extremities: No clubbing or  edema ? ?Neurologic Exam: ?Mental Status: Awake, alert, attentive to examiner. Oriented to self and environment. Language is fluent with intact comprehension.  ?Cranial Nerves: Visual acuity is grossly normal. Visual fields are full. Extra-ocular movements intact. No ptosis. Face is symmetric ?Motor: Tone and bulk are normal. Power is full in both arms and legs. Reflexes are symmetric, no pathologic reflexes present.  ?Sensory: Intact to light touch ?Gait: Normal. ? ? ?Labs: ?I have reviewed the data as listed ?   ?Component Value Date/Time  ? NA 139 11/29/2021 1202  ? K 4.2 11/29/2021 1202  ? CL 105 11/29/2021 1202  ? CO2 29 11/29/2021 1202  ? GLUCOSE 88 11/29/2021 1202  ? BUN 10 11/29/2021 1202  ? CREATININE 0.84 11/29/2021 1202  ? CALCIUM 9.5 11/29/2021 1202  ? PROT 7.2 11/29/2021 1202  ? ALBUMIN 4.4 11/29/2021 1202  ? AST 18 11/29/2021 1202  ? ALT 13 11/29/2021 1202  ? ALKPHOS 51 11/29/2021 1202  ? BILITOT 0.6 11/29/2021 1202  ? GFRNONAA >60 11/29/2021 1202  ? ?Lab Results  ?Component Value Date  ? WBC 3.6 (L) 12/13/2021  ?  NEUTROABS 2.2 12/13/2021  ? HGB 14.8 12/13/2021  ? HCT 43.3 12/13/2021  ? MCV 86.1 12/13/2021  ? PLT 201 12/13/2021  ? ?Assessment/Plan ?Glioblastoma, IDH-wildtype ([place]) [C71.9] ? ?Herold Varnell is clinically stable today, now having started cycle #3 of CCNU/Avastin (day 21/42).  No new or progressive changes today. ? ?We recommended continuation of second line therapy with cycle #3 CCNU 47m/m2 PO q6 weeks and Avastin 143mkg IV q2 weeks.   ? ?Chemotherapy should be held for the following: ?? ANC less than 1,000 ?? Platelets less than 100,000 ?? LFT or creatinine greater than 2x ULN ?? If clinical concerns/contraindications develop ? ?Avastin should be held for the following: ?? ANC less than 500 ?? Platelets less than 50,000 ?? LFT or creatinine greater than 2x ULN ?? If clinical concerns/contraindications develop ? ?Keppra will stay at 75084mID. ? ?We ask that EucLayden Caterinoturn to  clinic in 2 weeks for avastin.  Cycle #4 due to dose 12/27/21, with MRI to follow prior to cycle #5. ? ?We appreciate the opportunity to participate in the care of EucSkidmore ? ?All questions were answered. The patie

## 2021-12-13 NOTE — Progress Notes (Signed)
Per Dr Mickeal Skinner ok to treat today 12/13/2021 with Avastin with labs and vital signs. ?

## 2021-12-13 NOTE — Patient Instructions (Signed)
[facility]  Discharge Instructions: °Thank you for choosing [facility] to provide your oncology and hematology care.  ° °If you have a lab appointment with the Cancer Center, please go directly to the Cancer Center and check in at the registration area. °  °Wear comfortable clothing and clothing appropriate for easy access to any Portacath or PICC line.  ° °We strive to give you quality time with your provider. You may need to reschedule your appointment if you arrive late (15 or more minutes).  Arriving late affects you and other patients whose appointments are after yours.  Also, if you miss three or more appointments without notifying the office, you may be dismissed from the clinic at the provider’s discretion.    °  °For prescription refill requests, have your pharmacy contact our office and allow 72 hours for refills to be completed.   ° °Today you received the following chemotherapy and/or immunotherapy agents: Bevacizumab.     °  °To help prevent nausea and vomiting after your treatment, we encourage you to take your nausea medication as directed. ° °BELOW ARE SYMPTOMS THAT SHOULD BE REPORTED IMMEDIATELY: °*FEVER GREATER THAN 100.4 F (38 °C) OR HIGHER °*CHILLS OR SWEATING °*NAUSEA AND VOMITING THAT IS NOT CONTROLLED WITH YOUR NAUSEA MEDICATION °*UNUSUAL SHORTNESS OF BREATH °*UNUSUAL BRUISING OR BLEEDING °*URINARY PROBLEMS (pain or burning when urinating, or frequent urination) °*BOWEL PROBLEMS (unusual diarrhea, constipation, pain near the anus) °TENDERNESS IN MOUTH AND THROAT WITH OR WITHOUT PRESENCE OF ULCERS (sore throat, sores in mouth, or a toothache) °UNUSUAL RASH, SWELLING OR PAIN  °UNUSUAL VAGINAL DISCHARGE OR ITCHING  ° °Items with * indicate a potential emergency and should be followed up as soon as possible or go to the Emergency Department if any problems should occur. ° °Please show the CHEMOTHERAPY ALERT CARD or IMMUNOTHERAPY ALERT CARD at check-in  to the Emergency Department and triage nurse. ° °Should you have questions after your visit or need to cancel or reschedule your appointment, please contact  CANCER CENTER MEDICAL ONCOLOGY  Dept: [phone]  and follow the prompts.  Office hours are 8:00 a.m. to 4:30 p.m. Monday - Friday. Please note that voicemails left after 4:00 p.m. may not be returned until the following business day.  We are closed weekends and major holidays. You have access to a nurse at all times for urgent questions. Please call the main number to the clinic Dept: [phone] and follow the prompts. ° ° °For any non-urgent questions, you may also contact your provider using MyChart. We now offer e-Visits for anyone 18 and older to request care online for non-urgent symptoms. For details visit [email or web address]. °  °Also download the MyChart app! Go to the app store, search "MyChart", open the app, select , and log in with your MyChart username and password. ° °Due to Covid, a mask is required upon entering the hospital/clinic. If you do not have a mask, one will be given to you upon arrival. For doctor visits, patients may have 1 support Hunter Terry aged 18 or older with them. For treatment visits, patients cannot have anyone with them due to current Covid guidelines and our immunocompromised population.  ° °

## 2021-12-14 ENCOUNTER — Telehealth: Payer: Self-pay | Admitting: Internal Medicine

## 2021-12-14 NOTE — Telephone Encounter (Signed)
Per 5/4 los called pt using interpreter to let him know about upcoming  appointment.   Pt confirmed appointment  ?

## 2021-12-27 ENCOUNTER — Inpatient Hospital Stay (HOSPITAL_BASED_OUTPATIENT_CLINIC_OR_DEPARTMENT_OTHER): Payer: Medicaid Other | Admitting: Internal Medicine

## 2021-12-27 ENCOUNTER — Inpatient Hospital Stay: Payer: Medicaid Other

## 2021-12-27 ENCOUNTER — Other Ambulatory Visit: Payer: Self-pay

## 2021-12-27 VITALS — BP 125/72 | HR 84 | Temp 97.5°F | Resp 20 | Wt 159.8 lb

## 2021-12-27 DIAGNOSIS — C719 Malignant neoplasm of brain, unspecified: Secondary | ICD-10-CM

## 2021-12-27 DIAGNOSIS — Z5111 Encounter for antineoplastic chemotherapy: Secondary | ICD-10-CM | POA: Diagnosis not present

## 2021-12-27 DIAGNOSIS — R569 Unspecified convulsions: Secondary | ICD-10-CM

## 2021-12-27 LAB — CBC WITH DIFFERENTIAL (CANCER CENTER ONLY)
Abs Immature Granulocytes: 0.01 10*3/uL (ref 0.00–0.07)
Basophils Absolute: 0 10*3/uL (ref 0.0–0.1)
Basophils Relative: 0 %
Eosinophils Absolute: 0.1 10*3/uL (ref 0.0–0.5)
Eosinophils Relative: 3 %
HCT: 42.9 % (ref 39.0–52.0)
Hemoglobin: 15.3 g/dL (ref 13.0–17.0)
Immature Granulocytes: 0 %
Lymphocytes Relative: 26 %
Lymphs Abs: 0.8 10*3/uL (ref 0.7–4.0)
MCH: 30.4 pg (ref 26.0–34.0)
MCHC: 35.7 g/dL (ref 30.0–36.0)
MCV: 85.3 fL (ref 80.0–100.0)
Monocytes Absolute: 0.4 10*3/uL (ref 0.1–1.0)
Monocytes Relative: 13 %
Neutro Abs: 1.8 10*3/uL (ref 1.7–7.7)
Neutrophils Relative %: 58 %
Platelet Count: 194 10*3/uL (ref 150–400)
RBC: 5.03 MIL/uL (ref 4.22–5.81)
RDW: 13.7 % (ref 11.5–15.5)
WBC Count: 3.1 10*3/uL — ABNORMAL LOW (ref 4.0–10.5)
nRBC: 0 % (ref 0.0–0.2)

## 2021-12-27 LAB — CMP (CANCER CENTER ONLY)
ALT: 16 U/L (ref 0–44)
AST: 19 U/L (ref 15–41)
Albumin: 4.3 g/dL (ref 3.5–5.0)
Alkaline Phosphatase: 58 U/L (ref 38–126)
Anion gap: 6 (ref 5–15)
BUN: 8 mg/dL (ref 6–20)
CO2: 28 mmol/L (ref 22–32)
Calcium: 9.1 mg/dL (ref 8.9–10.3)
Chloride: 105 mmol/L (ref 98–111)
Creatinine: 0.73 mg/dL (ref 0.61–1.24)
GFR, Estimated: >60 mL/min (ref >60)
Glucose, Bld: 93 mg/dL (ref 70–99)
Potassium: 4.2 mmol/L (ref 3.5–5.1)
Sodium: 139 mmol/L (ref 135–145)
Total Bilirubin: 0.5 mg/dL (ref 0.3–1.2)
Total Protein: 7.2 g/dL (ref 6.5–8.1)

## 2021-12-27 LAB — TOTAL PROTEIN, URINE DIPSTICK: Protein, ur: NEGATIVE mg/dL

## 2021-12-27 MED ORDER — SODIUM CHLORIDE 0.9 % IV SOLN: Freq: Once | INTRAVENOUS | Status: AC

## 2021-12-27 MED ORDER — SODIUM CHLORIDE 0.9 % IV SOLN
10.0000 mg/kg | Freq: Once | INTRAVENOUS | Status: AC
  Administered 2021-12-27: 700 mg via INTRAVENOUS
  Filled 2021-12-27: qty 16

## 2021-12-27 NOTE — Patient Instructions (Signed)
[facility]  Discharge Instructions: °Thank you for choosing [facility] to provide your oncology and hematology care.  ° °If you have a lab appointment with the Cancer Center, please go directly to the Cancer Center and check in at the registration area. °  °Wear comfortable clothing and clothing appropriate for easy access to any Portacath or PICC line.  ° °We strive to give you quality time with your provider. You may need to reschedule your appointment if you arrive late (15 or more minutes).  Arriving late affects you and other patients whose appointments are after yours.  Also, if you miss three or more appointments without notifying the office, you may be dismissed from the clinic at the provider’s discretion.    °  °For prescription refill requests, have your pharmacy contact our office and allow 72 hours for refills to be completed.   ° °Today you received the following chemotherapy and/or immunotherapy agents: bevacizumab    °  °To help prevent nausea and vomiting after your treatment, we encourage you to take your nausea medication as directed. ° °BELOW ARE SYMPTOMS THAT SHOULD BE REPORTED IMMEDIATELY: °*FEVER GREATER THAN 100.4 F (38 °C) OR HIGHER °*CHILLS OR SWEATING °*NAUSEA AND VOMITING THAT IS NOT CONTROLLED WITH YOUR NAUSEA MEDICATION °*UNUSUAL SHORTNESS OF BREATH °*UNUSUAL BRUISING OR BLEEDING °*URINARY PROBLEMS (pain or burning when urinating, or frequent urination) °*BOWEL PROBLEMS (unusual diarrhea, constipation, pain near the anus) °TENDERNESS IN MOUTH AND THROAT WITH OR WITHOUT PRESENCE OF ULCERS (sore throat, sores in mouth, or a toothache) °UNUSUAL RASH, SWELLING OR PAIN  °UNUSUAL VAGINAL DISCHARGE OR ITCHING  ° °Items with * indicate a potential emergency and should be followed up as soon as possible or go to the Emergency Department if any problems should occur. ° °Please show the CHEMOTHERAPY ALERT CARD or IMMUNOTHERAPY ALERT CARD at check-in  to the Emergency Department and triage nurse. ° °Should you have questions after your visit or need to cancel or reschedule your appointment, please contact [facility]  Dept: [phone]  and follow the prompts.  Office hours are 8:00 a.m. to 4:30 p.m. Monday - Friday. Please note that voicemails left after 4:00 p.m. may not be returned until the following business day.  We are closed weekends and major holidays. You have access to a nurse at all times for urgent questions. Please call the main number to the clinic Dept: [phone] and follow the prompts. ° ° °For any non-urgent questions, you may also contact your provider using MyChart. We now offer e-Visits for anyone 18 and older to request care online for non-urgent symptoms. For details visit [email or web address]. °  °Also download the MyChart app! Go to the app store, search "MyChart", open the app, select , and log in with your MyChart username and password. ° °Due to Covid, a mask is required upon entering the hospital/clinic. If you do not have a mask, one will be given to you upon arrival. For doctor visits, patients may have 1 support person aged 18 or older with them. For treatment visits, patients cannot have anyone with them due to current Covid guidelines and our immunocompromised population.  ° °

## 2021-12-27 NOTE — Progress Notes (Signed)
[facility] [address] [place], [place] [address] [phone]   Interval Evaluation  Date of Service: 12/27/21 [name] Patient MRN: [id number] Patient DOB: [birth year] Provider: Ventura Sellers, MD  Identifying Statement:  Hunter Terry is a 61 y.o. male with left temporal glioblastoma   Oncologic History: Oncology History  Glioblastoma, IDH-wildtype ([place])  06/15/2020 Surgery   Stereotactic biopsy, L temporal [name]).  Path demonstrates glioblastoma IDH-1 wt   08/02/2020 Surgery   Debulking craniotomy with Dr. [name]   09/05/2020 - 10/17/2020 Radiation Therapy   IMRT with concurrent Temozolomide [name])   11/19/2020 -  Chemotherapy   Initiates cycle #1 adjuvant Temozolomide       07/17/2021 -  Chemotherapy   Patient is on Treatment Plan : BRAIN Lomustine q42d      07/26/2021 -  Chemotherapy   Patient is on Treatment Plan : BRAIN GBM Bevacizumab 14d x 6 cycles        Biomarkers:  MGMT Unknown.  IDH 1/2 Wild type.  EGFR Unknown  TERT "Mutated   Interval History:  Hunter Terry presents today for follow up for avastin infusion; he has now completed cycle #3 of CCNU/avastin.  No clinical changes today.  No balance issues, motor dysfunction.  Continues on Keppra $RemoveB'750mg'wwPonqLs$  BID, no further seizures.  H+P (07/24/20) Patient presented to medical attention in late October, 2021 with new onset seizure.  Event was decribed as loss of consciousness, sudden, without clarity on further details aside from altered mental status upon awakening.  CNS imaging demonsrated non-enhancing mass within left temporal lobe c/w likely glioma; he underwent stereotactic biopsy with Dr. [name] on 06/15/20.  There was significant delay in finalizing path, explaining delay in follow up and evaluation.  He denies any seizures since discharge from hospital, on 4 anti-seizure drugs.  He does complain of dizziness and drowsiness with the  medications, however.  Also describes impaired short term memory.  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    Medications: Current Outpatient Medications on File Prior to Visit  Medication Sig Dispense Refill   baclofen (LIORESAL) 10 MG tablet Take 1 tablet (10 mg total) by mouth 3 (three) times daily as needed for muscle spasms. 30 each 0   Carboxymethylcellul-Glycerin (LUBRICATING EYE DROPS OP) Place 1 drop into both eyes daily as needed (dry eyes).     levETIRAcetam (KEPPRA) 750 MG tablet Take 1 tablet (750 mg total) by mouth 2 (two) times daily. 60 tablet 3   ondansetron (ZOFRAN) 8 MG tablet Take 1 tablet (8 mg total) by mouth 2 (two) times daily as needed. Start on the third day after chemotherapy. 30 tablet 1   [DISCONTINUED] lacosamide (VIMPAT) 200 MG TABS tablet Take 0.5 tablets (100 mg total) by mouth 2 (two) times daily. 30 tablet 0   No current facility-administered medications on file prior to visit.    Allergies: No Known Allergies Past Medical History:  Past Medical History:  Diagnosis Date   Cancer ([facility])    BRAIN TUMOR   Headache    Seizure [facility])    Past Surgical History:  Past Surgical History:  Procedure Laterality Date   APPLICATION OF CRANIAL NAVIGATION N/A 06/15/2020   Procedure: APPLICATION OF CRANIAL NAVIGATION;  Surgeon: [name], MD;  Location: [facility];  Service: Neurosurgery;  Laterality: N/A;   APPLICATION OF CRANIAL NAVIGATION N/A 08/02/2020   Procedure: APPLICATION OF CRANIAL NAVIGATION;  Surgeon: [name], MD;  Location: [facility];  Service: Neurosurgery;  Laterality: N/A;   CRANIOTOMY N/A 08/02/2020   Procedure: CRANIOTOMY LEFT TEMPORAL LOBECTOMY FOR TUMOR EXCISION;  Surgeon: [name], MD;  Location: [facility];  Service: Neurosurgery;  Laterality: N/A;   FRAMELESS  BIOPSY WITH BRAINLAB Left 06/15/2020   Procedure: Left temporal lobe stereotactic brain biopsy with brainlab;  Surgeon: [name], MD;  Location: [facility];   Service: Neurosurgery;  Laterality: Left;   Social History:  Social History   Socioeconomic History   Marital status: Married    Spouse name: Not on file   Number of children: Not on file   Years of education: Not on file   Highest education level: Not on file  Occupational History   Not on file  Tobacco Use   Smoking status: Never   Smokeless tobacco: Never  Substance and Sexual Activity   Alcohol use: Never   Drug use: Not on file   Sexual activity: Yes    Partners: Female  Other Topics Concern   Not on file  Social History Narrative   Not on file   Social Determinants of Health   Financial Resource Strain: Not on file  Food Insecurity: Not on file  Transportation Needs: Not on file  Physical Activity: Not on file  Stress: Not on file  Social Connections: Not on file  Intimate Partner Violence: Not on file   Family History:  Family History  Problem Relation Age of Onset   Cancer Neg Hx     Review of Systems: Constitutional: Doesn't report fevers, chills or abnormal weight loss Eyes: Doesn't report blurriness of vision Ears, nose, mouth, throat, and face: Doesn't report sore throat Respiratory: Doesn't report cough, dyspnea or wheezes Cardiovascular: Doesn't report palpitation, chest discomfort  Gastrointestinal:  Doesn't report nausea, constipation, diarrhea GU: Doesn't report incontinence Skin: Doesn't report skin rashes Neurological: Per HPI Musculoskeletal: Doesn't report joint pain Behavioral/Psych: Doesn't report anxiety  Physical Exam: Vitals:   12/27/21 0845  BP: 125/72  Pulse: 84  Resp: 20  Temp: (!) 97.5 F (36.4 C)  SpO2: 97%    KPS: 90. General: Alert, cooperative, pleasant, in no acute distress Head: Normal EENT: No conjunctival injection or scleral icterus.  Lungs: Resp effort normal Cardiac: Regular rate Abdomen: Non-distended abdomen Skin: No rashes cyanosis or petechiae. Extremities: No clubbing or edema  Neurologic  Exam: Mental Status: Awake, alert, attentive to examiner. Oriented to self and environment. Language is fluent with intact comprehension.  Cranial Nerves: Visual acuity is grossly normal. Visual fields are full. Extra-ocular movements intact. No ptosis. Face is symmetric Motor: Tone and bulk are normal. Power is full in both arms and legs. Reflexes are symmetric, no pathologic reflexes present.  Sensory: Intact to light touch Gait: Normal.   Labs: I have reviewed the data as listed    Component Value Date/Time   NA 139 12/13/2021 1333   K 4.7 12/13/2021 1333   CL 105 12/13/2021 1333   CO2 29 12/13/2021 1333   GLUCOSE 88 12/13/2021 1333   BUN 10 12/13/2021 1333   CREATININE 0.81 12/13/2021 1333   CALCIUM 9.5 12/13/2021 1333   PROT 7.1 12/13/2021 1333   ALBUMIN 4.3 12/13/2021 1333   AST 18 12/13/2021 1333   ALT 13 12/13/2021 1333   ALKPHOS 61 12/13/2021 1333   BILITOT 0.5 12/13/2021 1333   GFRNONAA >60 12/13/2021 1333   Lab Results  Component Value Date   WBC 3.1 (L) 12/27/2021   NEUTROABS 1.8 12/27/2021  HGB 15.3 12/27/2021   HCT 42.9 12/27/2021   MCV 85.3 12/27/2021   PLT 194 12/27/2021   Assessment/Plan Glioblastoma, IDH-wildtype ([place]) [C71.9]  Castulo Coker is clinically stable today, now having completed cycle #3 of CCNU/Avastin.  No new or progressive changes today.  We recommended continuation of second line therapy with initiation of cycle #4 CCNU $Remov'90mg'IybmVa$ /m2 PO q6 weeks and Avastin $RemoveBef'10mg'tNoZyDzBLx$ /kg IV q2 weeks.    Chemotherapy should be held for the following:  ANC less than 1,000  Platelets less than 100,000  LFT or creatinine greater than 2x ULN  If clinical concerns/contraindications develop  Avastin should be held for the following:  ANC less than 500  Platelets less than 50,000  LFT or creatinine greater than 2x ULN  If clinical concerns/contraindications develop  Keppra will stay at $Remov'750mg'qWXSPi$  BID.  We ask that Henson Fraticelli return to clinic in 2 weeks  for avastin.  MRI brain will be scheduled for 02/01/22 prior to planned fifth cycle.  We appreciate the opportunity to participate in the care of Z[place].    All questions were answered. The patient knows to call the clinic with any problems, questions or concerns. No barriers to learning were detected.  The total time spent in the encounter was 30 minutes and more than 50% was on counseling and review of test results   Ventura Sellers, MD Medical Director of Neuro-Oncology [facility] 12/27/21 8:57 AM

## 2021-12-28 ENCOUNTER — Telehealth: Payer: Self-pay | Admitting: Hematology and Oncology

## 2021-12-28 NOTE — Telephone Encounter (Signed)
Per 5/18 los called pt using interpreter to let him know about upcoming appointments  Pt wife confirmed appointment

## 2022-01-10 ENCOUNTER — Inpatient Hospital Stay (HOSPITAL_BASED_OUTPATIENT_CLINIC_OR_DEPARTMENT_OTHER): Payer: Medicaid Other | Admitting: Internal Medicine

## 2022-01-10 ENCOUNTER — Inpatient Hospital Stay: Payer: Medicaid Other

## 2022-01-10 ENCOUNTER — Other Ambulatory Visit: Payer: Self-pay

## 2022-01-10 ENCOUNTER — Inpatient Hospital Stay: Payer: Medicaid Other | Attending: Internal Medicine

## 2022-01-10 ENCOUNTER — Inpatient Hospital Stay: Payer: Medicaid Other | Admitting: Hematology and Oncology

## 2022-01-10 VITALS — BP 123/77 | HR 64 | Temp 97.7°F | Resp 20 | Wt 156.9 lb

## 2022-01-10 VITALS — BP 124/83 | HR 58

## 2022-01-10 DIAGNOSIS — C719 Malignant neoplasm of brain, unspecified: Secondary | ICD-10-CM

## 2022-01-10 DIAGNOSIS — C712 Malignant neoplasm of temporal lobe: Secondary | ICD-10-CM | POA: Diagnosis not present

## 2022-01-10 DIAGNOSIS — Z79899 Other long term (current) drug therapy: Secondary | ICD-10-CM | POA: Diagnosis not present

## 2022-01-10 DIAGNOSIS — Z5111 Encounter for antineoplastic chemotherapy: Secondary | ICD-10-CM | POA: Diagnosis present

## 2022-01-10 DIAGNOSIS — R569 Unspecified convulsions: Secondary | ICD-10-CM

## 2022-01-10 LAB — CBC WITH DIFFERENTIAL (CANCER CENTER ONLY)
Abs Immature Granulocytes: 0.01 10*3/uL (ref 0.00–0.07)
Basophils Absolute: 0 10*3/uL (ref 0.0–0.1)
Basophils Relative: 0 %
Eosinophils Absolute: 0.1 10*3/uL (ref 0.0–0.5)
Eosinophils Relative: 2 %
HCT: 43.4 % (ref 39.0–52.0)
Hemoglobin: 15.4 g/dL (ref 13.0–17.0)
Immature Granulocytes: 0 %
Lymphocytes Relative: 29 %
Lymphs Abs: 0.9 10*3/uL (ref 0.7–4.0)
MCH: 30.5 pg (ref 26.0–34.0)
MCHC: 35.5 g/dL (ref 30.0–36.0)
MCV: 85.9 fL (ref 80.0–100.0)
Monocytes Absolute: 0.5 10*3/uL (ref 0.1–1.0)
Monocytes Relative: 15 %
Neutro Abs: 1.6 10*3/uL — ABNORMAL LOW (ref 1.7–7.7)
Neutrophils Relative %: 54 %
Platelet Count: 231 10*3/uL (ref 150–400)
RBC: 5.05 MIL/uL (ref 4.22–5.81)
RDW: 13.9 % (ref 11.5–15.5)
WBC Count: 3 10*3/uL — ABNORMAL LOW (ref 4.0–10.5)
nRBC: 0 % (ref 0.0–0.2)

## 2022-01-10 LAB — CMP (CANCER CENTER ONLY)
ALT: 16 U/L (ref 0–44)
AST: 21 U/L (ref 15–41)
Albumin: 4.4 g/dL (ref 3.5–5.0)
Alkaline Phosphatase: 62 U/L (ref 38–126)
Anion gap: 6 (ref 5–15)
BUN: 10 mg/dL (ref 6–20)
CO2: 28 mmol/L (ref 22–32)
Calcium: 9.7 mg/dL (ref 8.9–10.3)
Chloride: 103 mmol/L (ref 98–111)
Creatinine: 0.75 mg/dL (ref 0.61–1.24)
GFR, Estimated: >60 mL/min (ref >60)
Glucose, Bld: 98 mg/dL (ref 70–99)
Potassium: 4.4 mmol/L (ref 3.5–5.1)
Sodium: 137 mmol/L (ref 135–145)
Total Bilirubin: 0.5 mg/dL (ref 0.3–1.2)
Total Protein: 7.2 g/dL (ref 6.5–8.1)

## 2022-01-10 LAB — TOTAL PROTEIN, URINE DIPSTICK: Protein, ur: NEGATIVE mg/dL

## 2022-01-10 MED ORDER — SODIUM CHLORIDE 0.9 % IV SOLN
10.0000 mg/kg | Freq: Once | INTRAVENOUS | Status: AC
  Administered 2022-01-10: 700 mg via INTRAVENOUS
  Filled 2022-01-10: qty 16

## 2022-01-10 MED ORDER — SODIUM CHLORIDE 0.9 % IV SOLN: Freq: Once | INTRAVENOUS | Status: AC

## 2022-01-10 NOTE — Progress Notes (Signed)
[facility] [place] [facility] [place], [place] [address] [phone]   Interval Evaluation  Date of Service: 01/10/22 Patient Name: Nason Conradt Patient MRN: [id number] Patient DOB: April 08, 1961 Provider: Ventura Sellers, MD  Identifying Statement:  Felis Quillin is a 61 y.o. male with left temporal glioblastoma   Oncologic History: Oncology History  Glioblastoma, IDH-wildtype ([place])  06/15/2020 Surgery   Stereotactic biopsy, L temporal [name]).  Path demonstrates glioblastoma IDH-1 wt   08/02/2020 Surgery   Debulking craniotomy with Dr. [name]   09/05/2020 - 10/17/2020 Radiation Therapy   IMRT with concurrent Temozolomide [name])   11/19/2020 -  Chemotherapy   Initiates cycle #1 adjuvant Temozolomide       07/17/2021 -  Chemotherapy   Patient is on Treatment Plan : BRAIN Lomustine q42d      07/26/2021 -  Chemotherapy   Patient is on Treatment Plan : BRAIN GBM Bevacizumab 14d x 6 cycles        Biomarkers:  MGMT Unknown.  IDH 1/2 Wild type.  EGFR Unknown  TERT "Mutated   Interval History:  Lindy Pennisi presents today for follow up for avastin infusion; he dosed cycle #4 CCNU two weeks ago.  No clinical changes today.  No balance issues, motor dysfunction.  Continues on Keppra 729m BID, no further seizures.  H+P (07/24/20) Patient presented to medical attention in late October, 2021 with new onset seizure.  Event was decribed as loss of consciousness, sudden, without clarity on further details aside from altered mental status upon awakening.  CNS imaging demonsrated non-enhancing mass within left temporal lobe c/w likely glioma; he underwent stereotactic biopsy with Dr. TMarcello Mooreson 06/15/20.  There was significant delay in finalizing path, explaining delay in follow up and evaluation.  He denies any seizures since discharge from hospital, on 4 anti-seizure drugs.  He does complain of dizziness and drowsiness with the medications,  however.  Also describes impaired short term memory.  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    Medications: Current Outpatient Medications on File Prior to Visit  Medication Sig Dispense Refill   baclofen (LIORESAL) 10 MG tablet Take 1 tablet (10 mg total) by mouth 3 (three) times daily as needed for muscle spasms. 30 each 0   Carboxymethylcellul-Glycerin (LUBRICATING EYE DROPS OP) Place 1 drop into both eyes daily as needed (dry eyes).     levETIRAcetam (KEPPRA) 750 MG tablet Take 1 tablet (750 mg total) by mouth 2 (two) times daily. 60 tablet 3   ondansetron (ZOFRAN) 8 MG tablet Take 1 tablet (8 mg total) by mouth 2 (two) times daily as needed. Start on the third day after chemotherapy. 30 tablet 1   [DISCONTINUED] lacosamide (VIMPAT) 200 MG TABS tablet Take 0.5 tablets (100 mg total) by mouth 2 (two) times daily. 30 tablet 0   No current facility-administered medications on file prior to visit.    Allergies: No Known Allergies Past Medical History:  Past Medical History:  Diagnosis Date   Cancer ([place]    BRAIN TUMOR   Headache    Seizure ([facility]    Past Surgical History:  Past Surgical History:  Procedure Laterality Date   APPLICATION OF CRANIAL NAVIGATION N/A 06/15/2020   Procedure: APPLICATION OF CRANIAL NAVIGATION;  Surgeon: TVallarie Mare MD;  [place] [facility]  Service: Neurosurgery;  Laterality: N/A;   APPLICATION OF CRANIAL NAVIGATION N/A 08/02/2020   Procedure: APPLICATION OF CRANIAL NAVIGATION;  Surgeon: TVallarie Mare MD;  Location: [facility];  Service: Neurosurgery;  Laterality: N/A;   CRANIOTOMY N/A 08/02/2020   Procedure: CRANIOTOMY LEFT TEMPORAL LOBECTOMY FOR TUMOR EXCISION;  Surgeon: [name], MD;  Location: [facility];  Service: Neurosurgery;  Laterality: N/A;   FRAMELESS  BIOPSY WITH BRAINLAB Left 06/15/2020   Procedure: Left temporal lobe stereotactic brain biopsy with brainlab;  Surgeon: [name], MD;  Location: [facility];  Service:  Neurosurgery;  Laterality: Left;   Social History:  Social History   Socioeconomic History   Marital status: Married    Spouse name: Not on file   Number of children: Not on file   Years of education: Not on file   Highest education level: Not on file  Occupational History   Not on file  Tobacco Use   Smoking status: Never   Smokeless tobacco: Never  Substance and Sexual Activity   Alcohol use: Never   Drug use: Not on file   Sexual activity: Yes    Partners: Female  Other Topics Concern   Not on file  Social History Narrative   Not on file   Social Determinants of Health   Financial Resource Strain: Not on file  Food Insecurity: Not on file  Transportation Needs: Not on file  Physical Activity: Not on file  Stress: Not on file  Social Connections: Not on file  Intimate Partner Violence: Not on file   Family History:  Family History  Problem Relation Age of Onset   Cancer Neg Hx     Review of Systems: Constitutional: Doesn't report fevers, chills or abnormal weight loss Eyes: Doesn't report blurriness of vision Ears, nose, mouth, throat, and face: Doesn't report sore throat Respiratory: Doesn't report cough, dyspnea or wheezes Cardiovascular: Doesn't report palpitation, chest discomfort  Gastrointestinal:  Doesn't report nausea, constipation, diarrhea GU: Doesn't report incontinence Skin: Doesn't report skin rashes Neurological: Per HPI Musculoskeletal: Doesn't report joint pain Behavioral/Psych: Doesn't report anxiety  Physical Exam: Vitals:   01/10/22 0840  BP: 123/77  Pulse: 64  Resp: 20  Temp: 97.7 F (36.5 C)  SpO2: 100%    KPS: 90. General: Alert, cooperative, pleasant, in no acute distress Head: Normal EENT: No conjunctival injection or scleral icterus.  Lungs: Resp effort normal Cardiac: Regular rate Abdomen: Non-distended abdomen Skin: No rashes cyanosis or petechiae. Extremities: No clubbing or edema  Neurologic Exam: Mental  Status: Awake, alert, attentive to examiner. Oriented to self and environment. Language is fluent with intact comprehension.  Cranial Nerves: Visual acuity is grossly normal. Visual fields are full. Extra-ocular movements intact. No ptosis. Face is symmetric Motor: Tone and bulk are normal. Power is full in both arms and legs. Reflexes are symmetric, no pathologic reflexes present.  Sensory: Intact to light touch Gait: Normal.   Labs: I have reviewed the data as listed    Component Value Date/Time   NA 137 01/10/2022 0801   K 4.4 01/10/2022 0801   CL 103 01/10/2022 0801   CO2 28 01/10/2022 0801   GLUCOSE 98 01/10/2022 0801   BUN 10 01/10/2022 0801   CREATININE 0.75 01/10/2022 0801   CALCIUM 9.7 01/10/2022 0801   PROT 7.2 01/10/2022 0801   ALBUMIN 4.4 01/10/2022 0801   AST 21 01/10/2022 0801   ALT 16 01/10/2022 0801   ALKPHOS 62 01/10/2022 0801   BILITOT 0.5 01/10/2022 0801   GFRNONAA >60 01/10/2022 0801   Lab Results  Component Value Date   WBC 3.0 (L) 01/10/2022   NEUTROABS 1.6 (L) 01/10/2022  HGB 15.4 01/10/2022   HCT 43.4 01/10/2022   MCV 85.9 01/10/2022   PLT 231 01/10/2022   Assessment/Plan Glioblastoma, IDH-wildtype ([place]) [C71.9]  Tayshon Minahan is clinically stable today, now  on day 15/42 of cycle #4 of CCNU/Avastin.  No clinical changes today.  We recommended continuation of second line therapy with cycle #4 CCNU 29m/m2 PO q6 weeks and Avastin 121mkg IV q2 weeks.    Chemotherapy should be held for the following:  ANC less than 1,000  Platelets less than 100,000  LFT or creatinine greater than 2x ULN  If clinical concerns/contraindications develop  Avastin should be held for the following:  ANC less than 500  Platelets less than 50,000  LFT or creatinine greater than 2x ULN  If clinical concerns/contraindications develop  Keppra will stay at 75069mID.  We ask that EucQuadarius Hentonturn to clinic in 2 weeks for avastin.  MRI brain will be  scheduled for 02/01/22 prior to planned fifth cycle.  We appreciate the opportunity to participate in the care of EucSutcliffe  All questions were answered. The patient knows to call the clinic with any problems, questions or concerns. No barriers to learning were detected.  The total time spent in the encounter was 30 minutes and more than 50% was on counseling and review of test results   ZacVentura SellersD Medical Director of Neuro-Oncology [facility] [place]/01/23 11:13 AM

## 2022-01-24 ENCOUNTER — Inpatient Hospital Stay: Payer: Medicaid Other

## 2022-01-24 ENCOUNTER — Other Ambulatory Visit: Payer: Self-pay

## 2022-01-24 ENCOUNTER — Inpatient Hospital Stay (HOSPITAL_BASED_OUTPATIENT_CLINIC_OR_DEPARTMENT_OTHER): Payer: Medicaid Other | Admitting: Internal Medicine

## 2022-01-24 ENCOUNTER — Inpatient Hospital Stay: Payer: Medicaid Other | Admitting: Hematology and Oncology

## 2022-01-24 VITALS — BP 133/81 | HR 65 | Temp 97.7°F | Resp 18 | Ht 65.0 in | Wt 155.5 lb

## 2022-01-24 VITALS — BP 125/77 | HR 58 | Temp 98.4°F | Resp 16

## 2022-01-24 DIAGNOSIS — C719 Malignant neoplasm of brain, unspecified: Secondary | ICD-10-CM

## 2022-01-24 DIAGNOSIS — R569 Unspecified convulsions: Secondary | ICD-10-CM

## 2022-01-24 DIAGNOSIS — Z5111 Encounter for antineoplastic chemotherapy: Secondary | ICD-10-CM | POA: Diagnosis not present

## 2022-01-24 LAB — CMP (CANCER CENTER ONLY)
ALT: 15 U/L (ref 0–44)
AST: 20 U/L (ref 15–41)
Albumin: 4.6 g/dL (ref 3.5–5.0)
Alkaline Phosphatase: 56 U/L (ref 38–126)
Anion gap: 7 (ref 5–15)
BUN: 12 mg/dL (ref 6–20)
CO2: 29 mmol/L (ref 22–32)
Calcium: 9.9 mg/dL (ref 8.9–10.3)
Chloride: 102 mmol/L (ref 98–111)
Creatinine: 0.92 mg/dL (ref 0.61–1.24)
GFR, Estimated: >60 mL/min (ref >60)
Glucose, Bld: 109 mg/dL — ABNORMAL HIGH (ref 70–99)
Potassium: 3.9 mmol/L (ref 3.5–5.1)
Sodium: 138 mmol/L (ref 135–145)
Total Bilirubin: 0.6 mg/dL (ref 0.3–1.2)
Total Protein: 7.7 g/dL (ref 6.5–8.1)

## 2022-01-24 LAB — CBC WITH DIFFERENTIAL (CANCER CENTER ONLY)
Abs Immature Granulocytes: 0 10*3/uL (ref 0.00–0.07)
Basophils Absolute: 0 10*3/uL (ref 0.0–0.1)
Basophils Relative: 1 %
Eosinophils Absolute: 0.1 10*3/uL (ref 0.0–0.5)
Eosinophils Relative: 3 %
HCT: 43.3 % (ref 39.0–52.0)
Hemoglobin: 15.5 g/dL (ref 13.0–17.0)
Immature Granulocytes: 0 %
Lymphocytes Relative: 24 %
Lymphs Abs: 0.8 10*3/uL (ref 0.7–4.0)
MCH: 30.7 pg (ref 26.0–34.0)
MCHC: 35.8 g/dL (ref 30.0–36.0)
MCV: 85.7 fL (ref 80.0–100.0)
Monocytes Absolute: 0.6 10*3/uL (ref 0.1–1.0)
Monocytes Relative: 19 %
Neutro Abs: 1.7 10*3/uL (ref 1.7–7.7)
Neutrophils Relative %: 53 %
Platelet Count: 155 10*3/uL (ref 150–400)
RBC: 5.05 MIL/uL (ref 4.22–5.81)
RDW: 13.7 % (ref 11.5–15.5)
WBC Count: 3.3 10*3/uL — ABNORMAL LOW (ref 4.0–10.5)
nRBC: 0 % (ref 0.0–0.2)

## 2022-01-24 LAB — TOTAL PROTEIN, URINE DIPSTICK: Protein, ur: NEGATIVE mg/dL

## 2022-01-24 MED ORDER — SODIUM CHLORIDE 0.9 % IV SOLN: Freq: Once | INTRAVENOUS | Status: AC

## 2022-01-24 MED ORDER — SODIUM CHLORIDE 0.9 % IV SOLN
10.0000 mg/kg | Freq: Once | INTRAVENOUS | Status: AC
  Administered 2022-01-24: 700 mg via INTRAVENOUS
  Filled 2022-01-24: qty 16

## 2022-01-24 NOTE — Patient Instructions (Signed)
[facility] MEDICAL [facility]COLOGY  Discharge Instructions: °Thank you for choosing [facility] to provide your oncology and hematology care.  ° °If you have a lab appointment with the Cancer Center, please go directly to the Cancer Center and check in at the registration area. °  °Wear comfortable clothing and clothing appropriate for easy access to any Portacath or PICC line.  ° °We strive to give you quality time with your provider. You may need to reschedule your appointment if you arrive late (15 or more minutes).  Arriving late affects you and other patients whose appointments are after yours.  Also, if you miss three or more appointments without notifying the office, you may be dismissed from the clinic at the provider’s discretion.    °  °For prescription refill requests, have your pharmacy contact our office and allow 72 hours for refills to be completed.   ° °Today you received the following chemotherapy and/or immunotherapy agents: bevacizumab    °  °To help prevent nausea and vomiting after your treatment, we encourage you to take your nausea medication as directed. ° °BELOW ARE SYMPTOMS THAT SHOULD BE REPORTED IMMEDIATELY: °*FEVER GREATER THAN 100.4 F (38 °C) OR HIGHER °*CHILLS OR SWEATING °*NAUSEA AND VOMITING THAT IS NOT CONTROLLED WITH YOUR NAUSEA MEDICATION °*UNUSUAL SHORTNESS OF BREATH °*UNUSUAL BRUISING OR BLEEDING °*URINARY PROBLEMS (pain or burning when urinating, or frequent urination) °*BOWEL PROBLEMS (unusual diarrhea, constipation, pain near the anus) °TENDERNESS IN MOUTH AND THROAT WITH OR WITHOUT PRESENCE OF ULCERS (sore throat, sores in mouth, or a toothache) °UNUSUAL RASH, SWELLING OR PAIN  °UNUSUAL VAGINAL DISCHARGE OR ITCHING  ° °Items with * indicate a potential emergency and should be followed up as soon as possible or go to the Emergency Department if any problems should occur. ° °Please show the CHEMOTHERAPY ALERT CARD or IMMUNOTHERAPY ALERT CARD at check-in  to the Emergency Department and triage nurse. ° °Should you have questions after your visit or need to cancel or reschedule your appointment, please contact [facility]  Dept: [phone]  and follow the prompts.  Office hours are 8:00 a.m. to 4:30 p.m. Monday - Friday. Please note that voicemails left after 4:00 p.m. may not be returned until the following business day.  We are closed weekends and major holidays. You have access to a nurse at all times for urgent questions. Please call the main number to the clinic Dept: [phone] and follow the prompts. ° ° °For any non-urgent questions, you may also contact your provider using MyChart. We now offer e-Visits for anyone 18 and older to request care online for non-urgent symptoms. For details visit [email or web address]. °  °Also download the MyChart app! Go to the app store, search "MyChart", open the app, select [facility], and log in with your MyChart username and password. ° °Due to Covid, a mask is required upon entering the hospital/clinic. If you do not have a mask, one will be given to you upon arrival. For doctor visits, patients may have 1 support person aged 18 or older with them. For treatment visits, patients cannot have anyone with them due to current Covid guidelines and our immunocompromised population.  ° °

## 2022-01-24 NOTE — Progress Notes (Signed)
[facility] [place], [place] [address] [phone]   Interval Evaluation  Date of Service: 01/24/22 [name] Patient MRN: [id number] Patient DOB: 06-06-1961 Provider: Ventura Sellers, MD  Identifying Statement:  Hunter Terry is a 61 y.o. male with left temporal glioblastoma   Oncologic History: Oncology History  Glioblastoma, IDH-wildtype ([place])  06/15/2020 Surgery   Stereotactic biopsy, L temporal Hunter Terry).  Path demonstrates glioblastoma IDH-1 wt   08/02/2020 Surgery   Debulking craniotomy with Dr. Marcello Terry   09/05/2020 - 10/17/2020 Radiation Therapy   IMRT with concurrent Temozolomide Hunter Terry)   11/19/2020 -  Chemotherapy   Initiates cycle #1 adjuvant Temozolomide       07/17/2021 -  Chemotherapy   Patient is on Treatment Plan : BRAIN Lomustine q42d     07/26/2021 -  Chemotherapy   Patient is on Treatment Plan : BRAIN GBM Bevacizumab 14d x 6 cycles       Biomarkers:  MGMT Unknown.  IDH 1/2 Wild type.  EGFR Unknown  TERT "Mutated   Interval History:  Hunter Terry presents today for follow up for avastin infusion; now in midst of cycle #4 CCNU.  No new or progressive deficits.  No balance issues, motor dysfunction.  Continues on Keppra 773m BID, no further seizures.  H+P (07/24/20) Patient presented to medical attention in late October, 2021 with new onset seizure.  Event was decribed as loss of consciousness, sudden, without clarity on further details aside from altered mental status upon awakening.  CNS imaging demonsrated non-enhancing mass within left temporal lobe c/w likely glioma; he underwent stereotactic biopsy with Dr. TMarcello Mooreson 06/15/20.  There was significant delay in finalizing path, explaining delay in follow up and evaluation.  He denies any seizures since discharge from hospital, on 4 anti-seizure drugs.  He does complain of dizziness and drowsiness with the medications, however.   Also describes impaired short term memory.  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    Medications: Current Outpatient Medications on File Prior to Visit  Medication Sig Dispense Refill   baclofen (LIORESAL) 10 MG tablet Take 1 tablet (10 mg total) by mouth 3 (three) times daily as needed for muscle spasms. 30 each 0   Carboxymethylcellul-Glycerin (LUBRICATING EYE DROPS OP) Place 1 drop into both eyes daily as needed (dry eyes).     levETIRAcetam (KEPPRA) 750 MG tablet Take 1 tablet (750 mg total) by mouth 2 (two) times daily. 60 tablet 3   ondansetron (ZOFRAN) 8 MG tablet Take 1 tablet (8 mg total) by mouth 2 (two) times daily as needed. Start on the third day after chemotherapy. 30 tablet 1   [DISCONTINUED] lacosamide (VIMPAT) 200 MG TABS tablet Take 0.5 tablets (100 mg total) by mouth 2 (two) times daily. 30 tablet 0   No current facility-administered medications on file prior to visit.    Allergies: No Known Allergies Past Medical History:  Past Medical History:  Diagnosis Date   Cancer ([place]    BRAIN TUMOR   Headache    Seizure ([facility]    Past Surgical History:  Past Surgical History:  Procedure Laterality Date   APPLICATION OF CRANIAL NAVIGATION N/A 06/15/2020   Procedure: APPLICATION OF CRANIAL NAVIGATION;  Surgeon: TVallarie Mare MD;  [place] [facility]  Service: Neurosurgery;  Laterality: N/A;   APPLICATION OF CRANIAL NAVIGATION N/A 08/02/2020   Procedure: APPLICATION OF CRANIAL NAVIGATION;  Surgeon: TVallarie Mare MD;  Location:  [facility];  Service: Neurosurgery;  Laterality: N/A;   CRANIOTOMY N/A 08/02/2020   Procedure: CRANIOTOMY LEFT TEMPORAL LOBECTOMY FOR TUMOR EXCISION;  Surgeon: Hunter Mare, MD;  Location: [facility];  Service: Neurosurgery;  Laterality: N/A;   FRAMELESS  BIOPSY WITH BRAINLAB Left 06/15/2020   Procedure: Left temporal lobe stereotactic brain biopsy with brainlab;  Surgeon: Hunter Mare, MD;  Location: [facility];  Service: Neurosurgery;   Laterality: Left;   Social History:  Social History   Socioeconomic History   Marital status: Married    Spouse name: Not on file   Number of children: Not on file   Years of education: Not on file   Highest education level: Not on file  Occupational History   Not on file  Tobacco Use   Smoking status: Never   Smokeless tobacco: Never  Substance and Sexual Activity   Alcohol use: Never   Drug use: Not on file   Sexual activity: Yes    Partners: Female  Other Topics Concern   Not on file  Social History Narrative   Not on file   Social Determinants of Health   Financial Resource Strain: Not on file  Food Insecurity: Not on file  Transportation Needs: Not on file  Physical Activity: Not on file  Stress: Not on file  Social Connections: Not on file  Intimate Partner Violence: Not on file   Family History:  Family History  Problem Relation Age of Onset   Cancer Neg Hx     Review of Systems: Constitutional: Doesn't report fevers, chills or abnormal weight loss Eyes: Doesn't report blurriness of vision Ears, nose, mouth, throat, and face: Doesn't report sore throat Respiratory: Doesn't report cough, dyspnea or wheezes Cardiovascular: Doesn't report palpitation, chest discomfort  Gastrointestinal:  Doesn't report nausea, constipation, diarrhea GU: Doesn't report incontinence Skin: Doesn't report skin rashes Neurological: Per HPI Musculoskeletal: Doesn't report joint pain Behavioral/Psych: Doesn't report anxiety  Physical Exam: Vitals:   01/24/22 0855  BP: 133/81  Pulse: 65  Resp: 18  Temp: 97.7 F (36.5 C)  SpO2: 98%    KPS: 90. General: Alert, cooperative, pleasant, in no acute distress Head: Normal EENT: No conjunctival injection or scleral icterus.  Lungs: Resp effort normal Cardiac: Regular rate Abdomen: Non-distended abdomen Skin: No rashes cyanosis or petechiae. Extremities: No clubbing or edema  Neurologic Exam: Mental Status: Awake, alert,  attentive to examiner. Oriented to self and environment. Language is fluent with intact comprehension.  Cranial Nerves: Visual acuity is grossly normal. Visual fields are full. Extra-ocular movements intact. No ptosis. Face is symmetric Motor: Tone and bulk are normal. Power is full in both arms and legs. Reflexes are symmetric, no pathologic reflexes present.  Sensory: Intact to light touch Gait: Normal.   Labs: I have reviewed the data as listed    Component Value Date/Time   NA 137 01/10/2022 0801   K 4.4 01/10/2022 0801   CL 103 01/10/2022 0801   CO2 28 01/10/2022 0801   GLUCOSE 98 01/10/2022 0801   BUN 10 01/10/2022 0801   CREATININE 0.75 01/10/2022 0801   CALCIUM 9.7 01/10/2022 0801   PROT 7.2 01/10/2022 0801   ALBUMIN 4.4 01/10/2022 0801   AST 21 01/10/2022 0801   ALT 16 01/10/2022 0801   ALKPHOS 62 01/10/2022 0801   BILITOT 0.5 01/10/2022 0801   GFRNONAA >60 01/10/2022 0801   Lab Results  Component Value Date   WBC 3.3 (L) 01/24/2022   NEUTROABS 1.7 01/24/2022  HGB 15.5 01/24/2022   HCT 43.3 01/24/2022   MCV 85.7 01/24/2022   PLT 155 01/24/2022   Assessment/Plan Glioblastoma, IDH-wildtype ([place]) [C71.9]  Jerrel Puls is clinically stable today, now  on day 29/42 of cycle #4 of CCNU/Avastin.  Labs within normal limits today.  We recommended continuation of second line therapy with cycle #4 CCNU 14m/m2 PO q6 weeks and Avastin 134mkg IV q2 weeks.    Chemotherapy should be held for the following:  ANC less than 1,000  Platelets less than 100,000  LFT or creatinine greater than 2x ULN  If clinical concerns/contraindications develop  Avastin should be held for the following:  ANC less than 500  Platelets less than 50,000  LFT or creatinine greater than 2x ULN  If clinical concerns/contraindications develop  Keppra will stay at 75087mID.  We ask that EucOrel Coolerturn to clinic in 2 weeks for avastin.  MRI brain scheduled for 02/01/22 prior to  cycle #5.  We appreciate the opportunity to participate in the care of EucHacienda San Jose  All questions were answered. The patient knows to call the clinic with any problems, questions or concerns. No barriers to learning were detected.  The total time spent in the encounter was 30 minutes and more than 50% was on counseling and review of test results   ZacVentura SellersD Medical Director of Neuro-Oncology [facility]/15/23 8:59 AM

## 2022-01-25 ENCOUNTER — Telehealth: Payer: Self-pay | Admitting: Internal Medicine

## 2022-01-25 NOTE — Telephone Encounter (Signed)
Per 6/15 los called pt using the interpreter .  Called the 3 numbers that were on file and was unable to reach pt or leave a message.  Calender is being mailed today and pt is my chart active

## 2022-01-28 ENCOUNTER — Other Ambulatory Visit: Payer: Self-pay | Admitting: Radiation Therapy

## 2022-02-02 ENCOUNTER — Ambulatory Visit (HOSPITAL_COMMUNITY)
Admission: RE | Admit: 2022-02-02 | Discharge: 2022-02-02 | Disposition: A | Discharge status: Home / Self Care | Payer: Medicaid Other | Source: Ambulatory Visit | Attending: Internal Medicine | Admitting: Internal Medicine

## 2022-02-02 DIAGNOSIS — C719 Malignant neoplasm of brain, unspecified: Secondary | ICD-10-CM | POA: Diagnosis present

## 2022-02-02 MED ORDER — GADOBUTROL 1 MMOL/ML IV SOLN
7.0000 mL | Freq: Once | INTRAVENOUS | Status: AC | PRN
  Administered 2022-02-02: 7 mL via INTRAVENOUS

## 2022-02-04 ENCOUNTER — Inpatient Hospital Stay: Payer: Medicaid Other

## 2022-02-07 ENCOUNTER — Inpatient Hospital Stay: Payer: Medicaid Other

## 2022-02-07 ENCOUNTER — Other Ambulatory Visit: Payer: Medicaid Other

## 2022-02-07 ENCOUNTER — Ambulatory Visit: Payer: Medicaid Other

## 2022-02-07 ENCOUNTER — Other Ambulatory Visit: Payer: Self-pay

## 2022-02-07 ENCOUNTER — Inpatient Hospital Stay (HOSPITAL_BASED_OUTPATIENT_CLINIC_OR_DEPARTMENT_OTHER): Payer: Medicaid Other | Admitting: Internal Medicine

## 2022-02-07 ENCOUNTER — Ambulatory Visit: Payer: Medicaid Other | Admitting: Hematology and Oncology

## 2022-02-07 VITALS — BP 135/81 | HR 62 | Temp 97.8°F | Resp 16 | Ht 65.0 in | Wt 159.0 lb

## 2022-02-07 VITALS — BP 143/88 | HR 54 | Resp 16

## 2022-02-07 DIAGNOSIS — Z5111 Encounter for antineoplastic chemotherapy: Secondary | ICD-10-CM | POA: Diagnosis not present

## 2022-02-07 DIAGNOSIS — C719 Malignant neoplasm of brain, unspecified: Secondary | ICD-10-CM

## 2022-02-07 DIAGNOSIS — R569 Unspecified convulsions: Secondary | ICD-10-CM | POA: Diagnosis not present

## 2022-02-07 LAB — CMP (CANCER CENTER ONLY)
ALT: 12 U/L (ref 0–44)
AST: 18 U/L (ref 15–41)
Albumin: 4.3 g/dL (ref 3.5–5.0)
Alkaline Phosphatase: 55 U/L (ref 38–126)
Anion gap: 6 (ref 5–15)
BUN: 10 mg/dL (ref 6–20)
CO2: 27 mmol/L (ref 22–32)
Calcium: 9.2 mg/dL (ref 8.9–10.3)
Chloride: 108 mmol/L (ref 98–111)
Creatinine: 0.78 mg/dL (ref 0.61–1.24)
GFR, Estimated: >60 mL/min (ref >60)
Glucose, Bld: 110 mg/dL — ABNORMAL HIGH (ref 70–99)
Potassium: 3.8 mmol/L (ref 3.5–5.1)
Sodium: 141 mmol/L (ref 135–145)
Total Bilirubin: 0.5 mg/dL (ref 0.3–1.2)
Total Protein: 6.7 g/dL (ref 6.5–8.1)

## 2022-02-07 LAB — CBC WITH DIFFERENTIAL (CANCER CENTER ONLY)
Abs Immature Granulocytes: 0.01 10*3/uL (ref 0.00–0.07)
Basophils Absolute: 0 10*3/uL (ref 0.0–0.1)
Basophils Relative: 0 %
Eosinophils Absolute: 0.2 10*3/uL (ref 0.0–0.5)
Eosinophils Relative: 5 %
HCT: 42.2 % (ref 39.0–52.0)
Hemoglobin: 15.1 g/dL (ref 13.0–17.0)
Immature Granulocytes: 0 %
Lymphocytes Relative: 23 %
Lymphs Abs: 0.7 10*3/uL (ref 0.7–4.0)
MCH: 31 pg (ref 26.0–34.0)
MCHC: 35.8 g/dL (ref 30.0–36.0)
MCV: 86.7 fL (ref 80.0–100.0)
Monocytes Absolute: 0.3 10*3/uL (ref 0.1–1.0)
Monocytes Relative: 10 %
Neutro Abs: 1.9 10*3/uL (ref 1.7–7.7)
Neutrophils Relative %: 62 %
Platelet Count: 154 10*3/uL (ref 150–400)
RBC: 4.87 MIL/uL (ref 4.22–5.81)
RDW: 13.7 % (ref 11.5–15.5)
WBC Count: 3.2 10*3/uL — ABNORMAL LOW (ref 4.0–10.5)
nRBC: 0 % (ref 0.0–0.2)

## 2022-02-07 LAB — TOTAL PROTEIN, URINE DIPSTICK: Protein, ur: NEGATIVE mg/dL

## 2022-02-07 MED ORDER — SODIUM CHLORIDE 0.9 % IV SOLN: Freq: Once | INTRAVENOUS | Status: AC

## 2022-02-07 MED ORDER — LOMUSTINE 40 MG PO CAPS: 40.0000 mg | ORAL_CAPSULE | Freq: Once | ORAL | 0 refills | Status: AC

## 2022-02-07 MED ORDER — LOMUSTINE 100 MG PO CAPS: 100.0000 mg | ORAL_CAPSULE | Freq: Once | ORAL | 0 refills | Status: AC

## 2022-02-07 MED ORDER — SODIUM CHLORIDE 0.9 % IV SOLN
10.0000 mg/kg | Freq: Once | INTRAVENOUS | Status: AC
  Administered 2022-02-07: 700 mg via INTRAVENOUS
  Filled 2022-02-07: qty 16

## 2022-02-07 MED ORDER — LOMUSTINE 10 MG PO CAPS: 20.0000 mg | ORAL_CAPSULE | Freq: Once | ORAL | 0 refills | Status: AC

## 2022-02-07 NOTE — Patient Instructions (Signed)
[facility] ONCOLOGY   Discharge Instructions: Thank you for choosing [facility] to provide your oncology and hematology care.   If you have a lab appointment with the [facility], please go directly to the [facility] and check in at the registration area.   Wear comfortable clothing and clothing appropriate for easy access to any Portacath or PICC line.   We strive to give you quality time with your provider. You may need to reschedule your appointment if you arrive late (15 or more minutes).  Arriving late affects you and other patients whose appointments are after yours.  Also, if you miss three or more appointments without notifying the office, you may be dismissed from the clinic at the provider's discretion.      For prescription refill requests, have your pharmacy contact our office and allow 72 hours for refills to be completed.    Today you received the following chemotherapy and/or immunotherapy agents: Bevacizumab (Avastin)      To help prevent nausea and vomiting after your treatment, we encourage you to take your nausea medication as directed.  BELOW ARE SYMPTOMS THAT SHOULD BE REPORTED IMMEDIATELY: *FEVER GREATER THAN 100.4 F (38 C) OR HIGHER *CHILLS OR SWEATING *NAUSEA AND VOMITING THAT IS NOT CONTROLLED WITH YOUR NAUSEA MEDICATION *UNUSUAL SHORTNESS OF BREATH *UNUSUAL BRUISING OR BLEEDING *URINARY PROBLEMS (pain or burning when urinating, or frequent urination) *BOWEL PROBLEMS (unusual diarrhea, constipation, pain near the anus) TENDERNESS IN MOUTH AND THROAT WITH OR WITHOUT PRESENCE OF ULCERS (sore throat, sores in mouth, or a toothache) UNUSUAL RASH, SWELLING OR PAIN  UNUSUAL VAGINAL DISCHARGE OR ITCHING   Items with * indicate a potential emergency and should be followed up as soon as possible or go to the Emergency Department if any problems should occur.  Please show the CHEMOTHERAPY ALERT CARD or IMMUNOTHERAPY ALERT CARD  at check-in to the Emergency Department and triage nurse.  Should you have questions after your visit or need to cancel or reschedule your appointment, please contact [facility]  Dept: [phone]  and follow the prompts.  Office hours are 8:00 a.m. to 4:30 p.m. Monday - Friday. Please note that voicemails left after 4:00 p.m. may not be returned until the following business day.  We are closed weekends and major holidays. You have access to a nurse at all times for urgent questions. Please call the main number to the clinic Dept: [phone] and follow the prompts.   For any non-urgent questions, you may also contact your provider using MyChart. We now offer e-Visits for anyone 21 and older to request care online for non-urgent symptoms. For details visit [email or web address].   Also download the MyChart app! Go to the app store, search "MyChart", open the app, select [facility], and log in with your MyChart username and password.  Masks are optional in the cancer centers. If you would like for your care team to wear a mask while they are taking care of you, please let them know. For doctor visits, patients may have with them one support person who is at least 61 years old. At this time, visitors are not allowed in the infusion area.

## 2022-02-07 NOTE — Progress Notes (Signed)
[place] at [place], [place] [address] [phone]   Interval Evaluation  Date of Service: 02/07/22 [name] Patient MRN: [id number] Patient DOB: 1960/12/10 Provider: Ventura Sellers, MD  Identifying Statement:  Hunter Terry is a 61 y.o. male with left temporal glioblastoma   Oncologic History: Oncology History  Glioblastoma, IDH-wildtype ([place])  06/15/2020 Surgery   Stereotactic biopsy, L temporal [name]).  Path demonstrates glioblastoma IDH-1 wt   08/02/2020 Surgery   Debulking craniotomy with Dr. [name]   09/05/2020 - 10/17/2020 Radiation Therapy   IMRT with concurrent Temozolomide [name])   11/19/2020 -  Chemotherapy   Initiates cycle #1 adjuvant Temozolomide       07/17/2021 -  Chemotherapy   Patient is on Treatment Plan : BRAIN Lomustine q42d     07/26/2021 -  Chemotherapy   Patient is on Treatment Plan : BRAIN GBM Bevacizumab 14d x 6 cycles       Biomarkers:  MGMT Unknown.  IDH 1/2 Wild type.  EGFR Unknown  TERT "Mutated   Interval History:  Hunter Terry presents today for follow up for avastin infusion; now having completed cycle #4 CCNU.  No new or progressive deficits.  No balance issues, motor dysfunction.  Continues on Keppra 757m BID, no further seizures.  H+P (07/24/20) Patient presented to medical attention in late October, 2021 with new onset seizure.  Event was decribed as loss of consciousness, sudden, without clarity on further details aside from altered mental status upon awakening.  CNS imaging demonsrated non-enhancing mass within left temporal lobe c/w likely glioma; he underwent stereotactic biopsy with Dr. TMarcello Mooreson 06/15/20.  There was significant delay in finalizing path, explaining delay in follow up and evaluation.  He denies any seizures since discharge from hospital, on 4 anti-seizure drugs.  He does complain of dizziness and drowsiness with the medications,  however.  Also describes impaired short term memory.  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    Medications: Current Outpatient Medications on File Prior to Visit  Medication Sig Dispense Refill   baclofen (LIORESAL) 10 MG tablet Take 1 tablet (10 mg total) by mouth 3 (three) times daily as needed for muscle spasms. 30 each 0   Carboxymethylcellul-Glycerin (LUBRICATING EYE DROPS OP) Place 1 drop into both eyes daily as needed (dry eyes).     levETIRAcetam (KEPPRA) 750 MG tablet Take 1 tablet (750 mg total) by mouth 2 (two) times daily. 60 tablet 3   ondansetron (ZOFRAN) 8 MG tablet Take 1 tablet (8 mg total) by mouth 2 (two) times daily as needed. Start on the third day after chemotherapy. 30 tablet 1   [DISCONTINUED] lacosamide (VIMPAT) 200 MG TABS tablet Take 0.5 tablets (100 mg total) by mouth 2 (two) times daily. 30 tablet 0   No current facility-administered medications on file prior to visit.    Allergies: No Known Allergies Past Medical History:  Past Medical History:  Diagnosis Date   [place]    BRAIN TUMOR   Headache    Seizure ([facility]    Past Surgical History:  Past Surgical History:  Procedure Laterality Date   APPLICATION OF CRANIAL NAVIGATION N/A 06/15/2020   Procedure: APPLICATION OF CRANIAL NAVIGATION;  Surgeon: TVallarie Mare MD;  [place] [facility]  Service: Neurosurgery;  Laterality: N/A;   APPLICATION OF CRANIAL NAVIGATION N/A 08/02/2020   Procedure: APPLICATION OF CRANIAL NAVIGATION;  Surgeon: TVallarie Mare MD;  [facility];  Service: Neurosurgery;  Laterality: N/A;   CRANIOTOMY N/A 08/02/2020   Procedure: CRANIOTOMY LEFT TEMPORAL LOBECTOMY FOR TUMOR EXCISION;  Surgeon: [name], MD;  Location: [place];  Service: Neurosurgery;  Laterality: N/A;   FRAMELESS  BIOPSY WITH BRAINLAB Left 06/15/2020   Procedure: Left temporal lobe stereotactic brain biopsy with brainlab;  Surgeon: [name], MD;  Location: [place];  Service:  Neurosurgery;  Laterality: Left;   Social History:  Social History   Socioeconomic History   Marital status: Married    Spouse name: Not on file   Number of children: Not on file   Years of education: Not on file   Highest education level: Not on file  Occupational History   Not on file  Tobacco Use   Smoking status: Never   Smokeless tobacco: Never  Substance and Sexual Activity   Alcohol use: Never   Drug use: Not on file   Sexual activity: Yes    Partners: Female  Other Topics Concern   Not on file  Social History Narrative   Not on file   Social Determinants of Health   Financial Resource Strain: Not on file  Food Insecurity: Not on file  Transportation Needs: Not on file  Physical Activity: Not on file  Stress: Not on file  Social Connections: Not on file  Intimate Partner Violence: Not on file   Family History:  Family History  Problem Relation Age of Onset   Cancer Neg Hx     Review of Systems: Constitutional: Doesn't report fevers, chills or abnormal weight loss Eyes: Doesn't report blurriness of vision Ears, nose, mouth, throat, and face: Doesn't report sore throat Respiratory: Doesn't report cough, dyspnea or wheezes Cardiovascular: Doesn't report palpitation, chest discomfort  Gastrointestinal:  Doesn't report nausea, constipation, diarrhea GU: Doesn't report incontinence Skin: Doesn't report skin rashes Neurological: Per HPI Musculoskeletal: Doesn't report joint pain Behavioral/Psych: Doesn't report anxiety  Physical Exam: Vitals:   02/07/22 0844  BP: 135/81  Pulse: 62  Resp: 16  Temp: 97.8 F (36.6 C)  SpO2: 100%    KPS: 90. General: Alert, cooperative, pleasant, in no acute distress Head: Normal EENT: No conjunctival injection or scleral icterus.  Lungs: Resp effort normal Cardiac: Regular rate Abdomen: Non-distended abdomen Skin: No rashes cyanosis or petechiae. Extremities: No clubbing or edema  Neurologic Exam: Mental  Status: Awake, alert, attentive to examiner. Oriented to self and environment. Language is fluent with intact comprehension.  Cranial Nerves: Visual acuity is grossly normal. Visual fields are full. Extra-ocular movements intact. No ptosis. Face is symmetric Motor: Tone and bulk are normal. Power is full in both arms and legs. Reflexes are symmetric, no pathologic reflexes present.  Sensory: Intact to light touch Gait: Normal.   Labs: I have reviewed the data as listed    Component Value Date/Time   NA 138 01/24/2022 0827   K 3.9 01/24/2022 0827   CL 102 01/24/2022 0827   CO2 29 01/24/2022 0827   GLUCOSE 109 (H) 01/24/2022 0827   BUN 12 01/24/2022 0827   CREATININE 0.92 01/24/2022 0827   CALCIUM 9.9 01/24/2022 0827   PROT 7.7 01/24/2022 0827   ALBUMIN 4.6 01/24/2022 0827   AST 20 01/24/2022 0827   ALT 15 01/24/2022 0827   ALKPHOS 56 01/24/2022 0827   BILITOT 0.6 01/24/2022 0827   GFRNONAA >60 01/24/2022 0827   Lab Results  Component Value Date   WBC 3.2 (L) 02/07/2022   NEUTROABS 1.9 02/07/2022  HGB 15.1 02/07/2022   HCT 42.2 02/07/2022   MCV 86.7 02/07/2022   PLT 154 02/07/2022   Imaging:  [place] Clinician Interpretation: I have personally reviewed the CNS images as listed.  My interpretation, in the context of the patient's clinical presentation, is stable disease  MR BRAIN W WO CONTRAST  Result Date: 02/04/2022 CLINICAL DATA:  Brain/CNS neoplasm, assess treatment response EXAM: MRI HEAD WITHOUT AND WITH CONTRAST TECHNIQUE: Multiplanar, multiecho pulse sequences of the brain and surrounding structures were obtained without and with intravenous contrast. CONTRAST:  68m GADAVIST GADOBUTROL 1 MMOL/ML IV SOLN COMPARISON:  10/20/2021 FINDINGS: Brain: Left anterior temporal resection cavity is again identified. Stable ill-defined enhancement in this region extending posteriorly. No new abnormal enhancement. Extent of abnormal T2 FLAIR hyperintensity is also unchanged. No acute  infarction. Chronic blood products associated with the resection. Ventricles are stable in size without hydrocephalus. Vascular: Major vessel flow voids at the skull base are preserved. Skull and upper cervical spine: Normal marrow signal is preserved. Sinuses/Orbits: Paranasal sinuses are aerated. Orbits are unremarkable. Other: Sella is unremarkable.  Mastoid air cells are clear. IMPRESSION: No significant change since 10/20/2021. Electronically Signed   By: PMacy MisM.D.   On: 02/04/2022 07:48    Assessment/Plan Glioblastoma, IDH-wildtype ([place] [C71.9]  Hunter Terry clinically stable today, now having completed cycle #4 of CCNU/Avastin.  MRI brain demonstrates stable findings.  We recommended continuation of second line therapy with cycle #5 CCNU 929mm2 PO q6 weeks and Avastin 107mg IV, now q3 weeks.    Chemotherapy should be held for the following:  ANC less than 1,000  Platelets less than 100,000  LFT or creatinine greater than 2x ULN  If clinical concerns/contraindications develop  Avastin should be held for the following:  ANC less than 500  Platelets less than 50,000  LFT or creatinine greater than 2x ULN  If clinical concerns/contraindications develop  Keppra will stay at 750m30mD.  We ask that Hunter Harkinsurn to clinic in 3 weeks for avastin.  Next MRI will be post cycle #6.  We appreciate the opportunity to participate in the care of Hunter Terry All questions were answered. The patient knows to call the clinic with any problems, questions or concerns. No barriers to learning were detected.  The total time spent in the encounter was 30 minutes and more than 50% was on counseling and review of test results   ZachVentura Sellers Medical Director of Neuro-Oncology [facility] Center[facility]29/23 8:56 AM

## 2022-02-08 ENCOUNTER — Telehealth: Payer: Self-pay | Admitting: Internal Medicine

## 2022-02-08 ENCOUNTER — Other Ambulatory Visit (HOSPITAL_COMMUNITY): Payer: Self-pay

## 2022-02-08 NOTE — Telephone Encounter (Signed)
Per 6/29 los called pt using the interpreter. First number there was no mailbox to leave a message and the 2nd number was not a working number.  Calender is being mailed.

## 2022-02-28 ENCOUNTER — Other Ambulatory Visit: Payer: Self-pay

## 2022-02-28 ENCOUNTER — Inpatient Hospital Stay: Payer: Medicaid Other

## 2022-02-28 ENCOUNTER — Inpatient Hospital Stay: Payer: Medicaid Other | Attending: Internal Medicine

## 2022-02-28 ENCOUNTER — Inpatient Hospital Stay (HOSPITAL_BASED_OUTPATIENT_CLINIC_OR_DEPARTMENT_OTHER): Payer: Medicaid Other | Admitting: Internal Medicine

## 2022-02-28 VITALS — BP 118/74 | HR 60 | Temp 97.7°F | Resp 16

## 2022-02-28 VITALS — BP 132/76 | HR 62 | Temp 97.8°F | Resp 15 | Ht 65.0 in | Wt 156.2 lb

## 2022-02-28 DIAGNOSIS — Z79899 Other long term (current) drug therapy: Secondary | ICD-10-CM | POA: Diagnosis not present

## 2022-02-28 DIAGNOSIS — C719 Malignant neoplasm of brain, unspecified: Secondary | ICD-10-CM

## 2022-02-28 DIAGNOSIS — Z5112 Encounter for antineoplastic immunotherapy: Secondary | ICD-10-CM | POA: Diagnosis not present

## 2022-02-28 DIAGNOSIS — R569 Unspecified convulsions: Secondary | ICD-10-CM

## 2022-02-28 DIAGNOSIS — C712 Malignant neoplasm of temporal lobe: Secondary | ICD-10-CM | POA: Insufficient documentation

## 2022-02-28 LAB — CBC WITH DIFFERENTIAL (CANCER CENTER ONLY)
Abs Immature Granulocytes: 0 10*3/uL (ref 0.00–0.07)
Basophils Absolute: 0 10*3/uL (ref 0.0–0.1)
Basophils Relative: 0 %
Eosinophils Absolute: 0 10*3/uL (ref 0.0–0.5)
Eosinophils Relative: 1 %
HCT: 43.2 % (ref 39.0–52.0)
Hemoglobin: 15.5 g/dL (ref 13.0–17.0)
Immature Granulocytes: 0 %
Lymphocytes Relative: 30 %
Lymphs Abs: 0.9 10*3/uL (ref 0.7–4.0)
MCH: 31.5 pg (ref 26.0–34.0)
MCHC: 35.9 g/dL (ref 30.0–36.0)
MCV: 87.8 fL (ref 80.0–100.0)
Monocytes Absolute: 0.4 10*3/uL (ref 0.1–1.0)
Monocytes Relative: 13 %
Neutro Abs: 1.6 10*3/uL — ABNORMAL LOW (ref 1.7–7.7)
Neutrophils Relative %: 56 %
Platelet Count: 184 10*3/uL (ref 150–400)
RBC: 4.92 MIL/uL (ref 4.22–5.81)
RDW: 13.7 % (ref 11.5–15.5)
WBC Count: 2.9 10*3/uL — ABNORMAL LOW (ref 4.0–10.5)
nRBC: 0 % (ref 0.0–0.2)

## 2022-02-28 LAB — CMP (CANCER CENTER ONLY)
ALT: 17 U/L (ref 0–44)
AST: 21 U/L (ref 15–41)
Albumin: 4.5 g/dL (ref 3.5–5.0)
Alkaline Phosphatase: 50 U/L (ref 38–126)
Anion gap: 4 — ABNORMAL LOW (ref 5–15)
BUN: 9 mg/dL (ref 6–20)
CO2: 29 mmol/L (ref 22–32)
Calcium: 10.1 mg/dL (ref 8.9–10.3)
Chloride: 105 mmol/L (ref 98–111)
Creatinine: 0.86 mg/dL (ref 0.61–1.24)
GFR, Estimated: >60 mL/min (ref >60)
Glucose, Bld: 105 mg/dL — ABNORMAL HIGH (ref 70–99)
Potassium: 4.3 mmol/L (ref 3.5–5.1)
Sodium: 138 mmol/L (ref 135–145)
Total Bilirubin: 0.6 mg/dL (ref 0.3–1.2)
Total Protein: 7.1 g/dL (ref 6.5–8.1)

## 2022-02-28 LAB — TOTAL PROTEIN, URINE DIPSTICK: Protein, ur: NEGATIVE mg/dL

## 2022-02-28 MED ORDER — SODIUM CHLORIDE 0.9 % IV SOLN: Freq: Once | INTRAVENOUS | Status: AC

## 2022-02-28 MED ORDER — SODIUM CHLORIDE 0.9 % IV SOLN
10.0000 mg/kg | Freq: Once | INTRAVENOUS | Status: AC
  Administered 2022-02-28: 700 mg via INTRAVENOUS
  Filled 2022-02-28: qty 16

## 2022-02-28 NOTE — Progress Notes (Signed)
[place] at [place], [place] [address] [phone]   Interval Evaluation  Date of Service: 02/28/22 [name] Patient MRN: [id number] Patient DOB: March 26, 1961 Provider: Ventura Sellers, MD  Identifying Statement:  Hunter Terry is a 61 y.o. male with left temporal glioblastoma   Oncologic History: Oncology History  Glioblastoma, IDH-wildtype ([place])  06/15/2020 Surgery   Stereotactic biopsy, L temporal Hunter Terry).  Path demonstrates glioblastoma IDH-1 wt   08/02/2020 Surgery   Debulking craniotomy with Dr. Marcello Terry   09/05/2020 - 10/17/2020 Radiation Therapy   IMRT with concurrent Temozolomide [name])   11/19/2020 -  Chemotherapy   Initiates cycle #1 adjuvant Temozolomide       07/17/2021 -  Chemotherapy   Patient is on Treatment Plan : BRAIN Lomustine q42d     07/26/2021 -  Chemotherapy   Patient is on Treatment Plan : BRAIN GBM Bevacizumab 14d x 6 cycles       Biomarkers:  MGMT Unknown.  IDH 1/2 Wild type.  EGFR Unknown  TERT "Mutated   Interval History:  Hunter Terry presents today for follow up for avastin infusion; having now dosed cycle #5 CCNU.  No new or progressive deficits.  No balance issues, motor dysfunction.  Continues on Keppra 781m BID, no further seizures.  H+P (07/24/20) Patient presented to medical attention in late October, 2021 with new onset seizure.  Event was decribed as loss of consciousness, sudden, without clarity on further details aside from altered mental status upon awakening.  CNS imaging demonsrated non-enhancing mass within left temporal lobe c/w likely glioma; he underwent stereotactic biopsy with Dr. TMarcello Mooreson 06/15/20.  There was significant delay in finalizing path, explaining delay in follow up and evaluation.  He denies any seizures since discharge from hospital, on 4 anti-seizure drugs.  He does complain of dizziness and drowsiness with the medications, however.   Also describes impaired short term memory.  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    Medications: Current Outpatient Medications on File Prior to Visit  Medication Sig Dispense Refill   levETIRAcetam (KEPPRA) 750 MG tablet Take 1 tablet (750 mg total) by mouth 2 (two) times daily. 60 tablet 3   ondansetron (ZOFRAN) 8 MG tablet Take 1 tablet (8 mg total) by mouth 2 (two) times daily as needed. Start on the third day after chemotherapy. 30 tablet 1   [DISCONTINUED] lacosamide (VIMPAT) 200 MG TABS tablet Take 0.5 tablets (100 mg total) by mouth 2 (two) times daily. 30 tablet 0   No current facility-administered medications on file prior to visit.    Allergies: No Known Allergies Past Medical History:  Past Medical History:  Diagnosis Date   [place]    BRAIN TUMOR   Headache    Seizure ([facility]    Past Surgical History:  Past Surgical History:  Procedure Laterality Date   APPLICATION OF CRANIAL NAVIGATION N/A 06/15/2020   Procedure: APPLICATION OF CRANIAL NAVIGATION;  Surgeon: TVallarie Mare MD;  [place] [facility]  Service: Neurosurgery;  Laterality: N/A;   APPLICATION OF CRANIAL NAVIGATION N/A 08/02/2020   Procedure: APPLICATION OF CRANIAL NAVIGATION;  Surgeon: TVallarie Mare MD;  [place] [facility]  Service: Neurosurgery;  Laterality: N/A;   CRANIOTOMY N/A 08/02/2020   Procedure: CRANIOTOMY LEFT TEMPORAL LOBECTOMY FOR TUMOR EXCISION;  Surgeon: TVallarie Mare MD;  [place] [facility]  Service: Neurosurgery;  Laterality: N/A;   FRAMELESS  BIOPSY WITH BRAINLAB Left 06/15/2020  Procedure: Left temporal lobe stereotactic brain biopsy with brainlab;  Surgeon: Hunter Mare, MD;  Location: [place];  Service: Neurosurgery;  Laterality: Left;   Social History:  Social History   Socioeconomic History   Marital status: Married    Spouse name: Not on file   Number of children: Not on file   Years of education: Not on file   Highest education level: Not on file   Occupational History   Not on file  Tobacco Use   Smoking status: Never   Smokeless tobacco: Never  Substance and Sexual Activity   Alcohol use: Never   Drug use: Not on file   Sexual activity: Yes    Partners: Female  Other Topics Concern   Not on file  Social History Narrative   Not on file   Social Determinants of Health   Financial Resource Strain: Not on file  Food Insecurity: Not on file  Transportation Needs: Not on file  Physical Activity: Not on file  Stress: Not on file  Social Connections: Not on file  Intimate Partner Violence: Not on file   Family History:  Family History  Problem Relation Age of Onset   Cancer Neg Hx     Review of Systems: Constitutional: Doesn't report fevers, chills or abnormal weight loss Eyes: Doesn't report blurriness of vision Ears, nose, mouth, throat, and face: Doesn't report sore throat Respiratory: Doesn't report cough, dyspnea or wheezes Cardiovascular: Doesn't report palpitation, chest discomfort  Gastrointestinal:  Doesn't report nausea, constipation, diarrhea GU: Doesn't report incontinence Skin: Doesn't report skin rashes Neurological: Per HPI Musculoskeletal: Doesn't report joint pain Behavioral/Psych: Doesn't report anxiety  Physical Exam: Vitals:   02/28/22 1231  BP: 132/76  Pulse: 62  Resp: 15  Temp: 97.8 F (36.6 C)  SpO2: 100%    KPS: 90. General: Alert, cooperative, pleasant, in no acute distress Head: Normal EENT: No conjunctival injection or scleral icterus.  Lungs: Resp effort normal Cardiac: Regular rate Abdomen: Non-distended abdomen Skin: No rashes cyanosis or petechiae. Extremities: No clubbing or edema  Neurologic Exam: Mental Status: Awake, alert, attentive to examiner. Oriented to self and environment. Language is fluent with intact comprehension.  Cranial Nerves: Visual acuity is grossly normal. Visual fields are full. Extra-ocular movements intact. No ptosis. Face is symmetric Motor:  Tone and bulk are normal. Power is full in both arms and legs. Reflexes are symmetric, no pathologic reflexes present.  Sensory: Intact to light touch Gait: Normal.   Labs: I have reviewed the data as listed    Component Value Date/Time   NA 141 02/07/2022 0831   K 3.8 02/07/2022 0831   CL 108 02/07/2022 0831   CO2 27 02/07/2022 0831   GLUCOSE 110 (H) 02/07/2022 0831   BUN 10 02/07/2022 0831   CREATININE 0.78 02/07/2022 0831   CALCIUM 9.2 02/07/2022 0831   PROT 6.7 02/07/2022 0831   ALBUMIN 4.3 02/07/2022 0831   AST 18 02/07/2022 0831   ALT 12 02/07/2022 0831   ALKPHOS 55 02/07/2022 0831   BILITOT 0.5 02/07/2022 0831   GFRNONAA >60 02/07/2022 0831   Lab Results  Component Value Date   WBC 2.9 (L) 02/28/2022   NEUTROABS 1.6 (L) 02/28/2022   HGB 15.5 02/28/2022   HCT 43.2 02/28/2022   MCV 87.8 02/28/2022   PLT 184 02/28/2022   Imaging:  [place] Clinician Interpretation: I have personally reviewed the CNS images as listed.  My interpretation, in the context of the patient's clinical presentation, is stable disease  MR BRAIN W WO CONTRAST  Result Date: 02/04/2022 CLINICAL DATA:  Brain/CNS neoplasm, assess treatment response EXAM: MRI HEAD WITHOUT AND WITH CONTRAST TECHNIQUE: Multiplanar, multiecho pulse sequences of the brain and surrounding structures were obtained without and with intravenous contrast. CONTRAST:  75m GADAVIST GADOBUTROL 1 MMOL/ML IV SOLN COMPARISON:  10/20/2021 FINDINGS: Brain: Left anterior temporal resection cavity is again identified. Stable ill-defined enhancement in this region extending posteriorly. No new abnormal enhancement. Extent of abnormal T2 FLAIR hyperintensity is also unchanged. No acute infarction. Chronic blood products associated with the resection. Ventricles are stable in size without hydrocephalus. Vascular: Major vessel flow voids at the skull base are preserved. Skull and upper cervical spine: Normal marrow signal is preserved.  Sinuses/Orbits: Paranasal sinuses are aerated. Orbits are unremarkable. Other: Sella is unremarkable.  Mastoid air cells are clear. IMPRESSION: No significant change since 10/20/2021. Electronically Signed   By: PMacy MisM.D.   On: 02/04/2022 07:48    Assessment/Plan Glioblastoma, IDH-wildtype ([place] [C71.9]  Nathian RFranssenis clinically stable today, now having completed cycle #5 of CCNU/Avastin, dosed on 02/11/22.  Labs within normal limits.  We recommended continuation of second line therapy with day 17/42 cycle #5 CCNU 938mm2 PO q6 weeks and Avastin 1073mg IV, now q3 weeks.    Chemotherapy should be held for the following:  ANC less than 1,000  Platelets less than 100,000  LFT or creatinine greater than 2x ULN  If clinical concerns/contraindications develop  Avastin should be held for the following:  ANC less than 500  Platelets less than 50,000  LFT or creatinine greater than 2x ULN  If clinical concerns/contraindications develop  Keppra will stay at 750m56mD.  We ask that EuclDwyne Hasegawaurn to clinic in 3 weeks for avastin.  Next MRI will be post cycle #6.  We appreciate the opportunity to participate in the care of [place] All questions were answered. The patient knows to call the clinic with any problems, questions or concerns. No barriers to learning were detected.  The total time spent in the encounter was 30 minutes and more than 50% was on counseling and review of test results   ZachVentura Sellers Medical Director of Neuro-Oncology [facility] Inc[facility]esl[facility] Marais20/23 12:41 PM

## 2022-03-01 ENCOUNTER — Telehealth: Payer: Self-pay | Admitting: Internal Medicine

## 2022-03-01 NOTE — Telephone Encounter (Signed)
Per 7/20 los called pt using interpreter.  Pt did  not answer  and was unable to leave a message.  Mailing pt a calender

## 2022-03-04 ENCOUNTER — Other Ambulatory Visit: Payer: Self-pay

## 2022-03-12 ENCOUNTER — Other Ambulatory Visit: Payer: Self-pay

## 2022-03-21 ENCOUNTER — Inpatient Hospital Stay: Payer: Medicaid Other

## 2022-03-21 ENCOUNTER — Other Ambulatory Visit: Payer: Self-pay

## 2022-03-21 ENCOUNTER — Inpatient Hospital Stay: Payer: Medicaid Other | Attending: Internal Medicine

## 2022-03-21 ENCOUNTER — Inpatient Hospital Stay (HOSPITAL_BASED_OUTPATIENT_CLINIC_OR_DEPARTMENT_OTHER): Payer: Medicaid Other | Admitting: Internal Medicine

## 2022-03-21 VITALS — BP 121/74 | HR 55 | Resp 18

## 2022-03-21 VITALS — BP 125/79 | HR 63 | Temp 97.7°F | Resp 20 | Wt 159.4 lb

## 2022-03-21 DIAGNOSIS — Z5112 Encounter for antineoplastic immunotherapy: Secondary | ICD-10-CM | POA: Diagnosis present

## 2022-03-21 DIAGNOSIS — Z79899 Other long term (current) drug therapy: Secondary | ICD-10-CM | POA: Insufficient documentation

## 2022-03-21 DIAGNOSIS — R569 Unspecified convulsions: Secondary | ICD-10-CM

## 2022-03-21 DIAGNOSIS — Z9221 Personal history of antineoplastic chemotherapy: Secondary | ICD-10-CM | POA: Diagnosis not present

## 2022-03-21 DIAGNOSIS — C712 Malignant neoplasm of temporal lobe: Secondary | ICD-10-CM | POA: Diagnosis not present

## 2022-03-21 DIAGNOSIS — C719 Malignant neoplasm of brain, unspecified: Secondary | ICD-10-CM

## 2022-03-21 DIAGNOSIS — Z923 Personal history of irradiation: Secondary | ICD-10-CM | POA: Diagnosis not present

## 2022-03-21 LAB — CMP (CANCER CENTER ONLY)
ALT: 45 U/L — ABNORMAL HIGH (ref 0–44)
AST: 35 U/L (ref 15–41)
Albumin: 4.5 g/dL (ref 3.5–5.0)
Alkaline Phosphatase: 76 U/L (ref 38–126)
Anion gap: 4 — ABNORMAL LOW (ref 5–15)
BUN: 17 mg/dL (ref 6–20)
CO2: 31 mmol/L (ref 22–32)
Calcium: 9.5 mg/dL (ref 8.9–10.3)
Chloride: 103 mmol/L (ref 98–111)
Creatinine: 0.92 mg/dL (ref 0.61–1.24)
GFR, Estimated: >60 mL/min (ref >60)
Glucose, Bld: 83 mg/dL (ref 70–99)
Potassium: 4.2 mmol/L (ref 3.5–5.1)
Sodium: 138 mmol/L (ref 135–145)
Total Bilirubin: 0.7 mg/dL (ref 0.3–1.2)
Total Protein: 7.4 g/dL (ref 6.5–8.1)

## 2022-03-21 LAB — CBC WITH DIFFERENTIAL (CANCER CENTER ONLY)
Abs Immature Granulocytes: 0.01 10*3/uL (ref 0.00–0.07)
Basophils Absolute: 0 10*3/uL (ref 0.0–0.1)
Basophils Relative: 0 %
Eosinophils Absolute: 0 10*3/uL (ref 0.0–0.5)
Eosinophils Relative: 1 %
HCT: 41.7 % (ref 39.0–52.0)
Hemoglobin: 14.7 g/dL (ref 13.0–17.0)
Immature Granulocytes: 0 %
Lymphocytes Relative: 29 %
Lymphs Abs: 0.7 10*3/uL (ref 0.7–4.0)
MCH: 31.3 pg (ref 26.0–34.0)
MCHC: 35.3 g/dL (ref 30.0–36.0)
MCV: 88.9 fL (ref 80.0–100.0)
Monocytes Absolute: 0.3 10*3/uL (ref 0.1–1.0)
Monocytes Relative: 13 %
Neutro Abs: 1.4 10*3/uL — ABNORMAL LOW (ref 1.7–7.7)
Neutrophils Relative %: 57 %
Platelet Count: 165 10*3/uL (ref 150–400)
RBC: 4.69 MIL/uL (ref 4.22–5.81)
RDW: 13.6 % (ref 11.5–15.5)
WBC Count: 2.5 10*3/uL — ABNORMAL LOW (ref 4.0–10.5)
nRBC: 0 % (ref 0.0–0.2)

## 2022-03-21 LAB — TOTAL PROTEIN, URINE DIPSTICK: Protein, ur: NEGATIVE mg/dL

## 2022-03-21 MED ORDER — SODIUM CHLORIDE 0.9 % IV SOLN
10.0000 mg/kg | Freq: Once | INTRAVENOUS | Status: AC
  Administered 2022-03-21: 700 mg via INTRAVENOUS
  Filled 2022-03-21: qty 16

## 2022-03-21 MED ORDER — SODIUM CHLORIDE 0.9 % IV SOLN: Freq: Once | INTRAVENOUS | Status: AC

## 2022-03-21 NOTE — Progress Notes (Signed)
[facility] [place], [place] [address] [phone]   Interval Evaluation  Date of Service: 03/21/22 [name] Patient MRN: [id number] Patient DOB: February 09, 1961 Provider: Ventura Sellers, MD  Identifying Statement:  Hunter Terry is a 61 y.o. male with left temporal glioblastoma   Oncologic History: Oncology History  Glioblastoma, IDH-wildtype ([place])  06/15/2020 Surgery   Stereotactic biopsy, L temporal [name]).  Path demonstrates glioblastoma IDH-1 wt   08/02/2020 Surgery   Debulking craniotomy with Dr. [name]   09/05/2020 - 10/17/2020 Radiation Therapy   IMRT with concurrent Temozolomide [name])   11/19/2020 -  Chemotherapy   Initiates cycle #1 adjuvant Temozolomide       07/17/2021 -  Chemotherapy   Patient is on Treatment Plan : BRAIN Lomustine q42d     07/26/2021 -  Chemotherapy   Patient is on Treatment Plan : BRAIN GBM Bevacizumab 14d x 6 cycles       Biomarkers:  MGMT Unknown.  IDH 1/2 Wild type.  EGFR Unknown  TERT "Mutated   Interval History:  Hunter Terry presents today for follow up for avastin infusion; having now completed cycle #5 CCNU.  Denies clinical changes today.  No balance issues, motor dysfunction.  Continues on Keppra $RemoveB'750mg'XnfBnVSR$  BID, no further seizures.  H+P (07/24/20) Patient presented to medical attention in late October, 2021 with new onset seizure.  Event was decribed as loss of consciousness, sudden, without clarity on further details aside from altered mental status upon awakening.  CNS imaging demonsrated non-enhancing mass within left temporal lobe c/w likely glioma; he underwent stereotactic biopsy with Dr. [name] on 06/15/20.  There was significant delay in finalizing path, explaining delay in follow up and evaluation.  He denies any seizures since discharge from hospital, on 4 anti-seizure drugs.  He does complain of dizziness and drowsiness with the medications,  however.  Also describes impaired short term memory.  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    Medications: Current Outpatient Medications on File Prior to Visit  Medication Sig Dispense Refill   levETIRAcetam (KEPPRA) 750 MG tablet Take 1 tablet (750 mg total) by mouth 2 (two) times daily. 60 tablet 3   ondansetron (ZOFRAN) 8 MG tablet Take 1 tablet (8 mg total) by mouth 2 (two) times daily as needed. Start on the third day after chemotherapy. 30 tablet 1   [DISCONTINUED] lacosamide (VIMPAT) 200 MG TABS tablet Take 0.5 tablets (100 mg total) by mouth 2 (two) times daily. 30 tablet 0   No current facility-administered medications on file prior to visit.    Allergies: No Known Allergies Past Medical History:  Past Medical History:  Diagnosis Date   Cancer ([place])    BRAIN TUMOR   Headache    Seizure [facility])    Past Surgical History:  Past Surgical History:  Procedure Laterality Date   APPLICATION OF CRANIAL NAVIGATION N/A 06/15/2020   Procedure: APPLICATION OF CRANIAL NAVIGATION;  Surgeon: [name], MD;  Location: [place];  Service: Neurosurgery;  Laterality: N/A;   APPLICATION OF CRANIAL NAVIGATION N/A 08/02/2020   Procedure: APPLICATION OF CRANIAL NAVIGATION;  Surgeon: [name], MD;  Location: [place];  Service: Neurosurgery;  Laterality: N/A;   CRANIOTOMY N/A 08/02/2020   Procedure: CRANIOTOMY LEFT TEMPORAL LOBECTOMY FOR TUMOR EXCISION;  Surgeon: [name], MD;  Location: [place];  Service: Neurosurgery;  Laterality: N/A;   FRAMELESS  BIOPSY WITH BRAINLAB Left 06/15/2020  Procedure: Left temporal lobe stereotactic brain biopsy with brainlab;  Surgeon: [name], MD;  Location: [place];  Service: Neurosurgery;  Laterality: Left;   Social History:  Social History   Socioeconomic History   Marital status: Married    Spouse name: Not on file   Number of children: Not on file   Years of education: Not on file   Highest education level: Not  on file  Occupational History   Not on file  Tobacco Use   Smoking status: Never   Smokeless tobacco: Never  Substance and Sexual Activity   Alcohol use: Never   Drug use: Not on file   Sexual activity: Yes    Partners: Female  Other Topics Concern   Not on file  Social History Narrative   Not on file   Social Determinants of Health   Financial Resource Strain: Not on file  Food Insecurity: Not on file  Transportation Needs: Not on file  Physical Activity: Not on file  Stress: Not on file  Social Connections: Not on file  Intimate Partner Violence: Not on file   Family History:  Family History  Problem Relation Age of Onset   Cancer Neg Hx     Review of Systems: Constitutional: Doesn't report fevers, chills or abnormal weight loss Eyes: Doesn't report blurriness of vision Ears, nose, mouth, throat, and face: Doesn't report sore throat Respiratory: Doesn't report cough, dyspnea or wheezes Cardiovascular: Doesn't report palpitation, chest discomfort  Gastrointestinal:  Doesn't report nausea, constipation, diarrhea GU: Doesn't report incontinence Skin: Doesn't report skin rashes Neurological: Per HPI Musculoskeletal: Doesn't report joint pain Behavioral/Psych: Doesn't report anxiety  Physical Exam: There were no vitals filed for this visit.   KPS: 90. General: Alert, cooperative, pleasant, in no acute distress Head: Normal EENT: No conjunctival injection or scleral icterus.  Lungs: Resp effort normal Cardiac: Regular rate Abdomen: Non-distended abdomen Skin: No rashes cyanosis or petechiae. Extremities: No clubbing or edema  Neurologic Exam: Mental Status: Awake, alert, attentive to examiner. Oriented to self and environment. Language is fluent with intact comprehension.  Cranial Nerves: Visual acuity is grossly normal. Visual fields are full. Extra-ocular movements intact. No ptosis. Face is symmetric Motor: Tone and bulk are normal. Power is full in both  arms and legs. Reflexes are symmetric, no pathologic reflexes present.  Sensory: Intact to light touch Gait: Normal.   Labs: I have reviewed the data as listed    Component Value Date/Time   NA 138 02/28/2022 1204   K 4.3 02/28/2022 1204   CL 105 02/28/2022 1204   CO2 29 02/28/2022 1204   GLUCOSE 105 (H) 02/28/2022 1204   BUN 9 02/28/2022 1204   CREATININE 0.86 02/28/2022 1204   CALCIUM 10.1 02/28/2022 1204   PROT 7.1 02/28/2022 1204   ALBUMIN 4.5 02/28/2022 1204   AST 21 02/28/2022 1204   ALT 17 02/28/2022 1204   ALKPHOS 50 02/28/2022 1204   BILITOT 0.6 02/28/2022 1204   GFRNONAA >60 02/28/2022 1204   Lab Results  Component Value Date   WBC 2.5 (L) 03/21/2022   NEUTROABS 1.4 (L) 03/21/2022   HGB 14.7 03/21/2022   HCT 41.7 03/21/2022   MCV 88.9 03/21/2022   PLT 165 03/21/2022   Imaging:  [place] Clinician Interpretation: I have personally reviewed the CNS images as listed.  My interpretation, in the context of the patient's clinical presentation, is stable disease  No results found.  Assessment/Plan Glioblastoma, IDH-wildtype ([place]) [C71.9]  Hunter Terry is clinically stable  today, now having completed cycle #5 of CCNU/Avastin, dosed on 02/11/22.  No new or progressive changes.  We recommended continuation of second line therapy with cycle #6 CCNU $Remov'90mg'agLTpu$ /m2 PO q6 weeks and Avastin $RemoveBef'10mg'ACKbvMmvUl$ /kg IV, now q3 weeks.    Chemotherapy should be held for the following:  ANC less than 1,000  Platelets less than 100,000  LFT or creatinine greater than 2x ULN  If clinical concerns/contraindications develop  Avastin should be held for the following:  ANC less than 500  Platelets less than 50,000  LFT or creatinine greater than 2x ULN  If clinical concerns/contraindications develop  Keppra will stay at $Remov'750mg'GGbgZi$  BID.  We ask that Hunter Terry return to clinic in 2 weeks for avastin.  Next MRI will be post cycle #6.  We appreciate the opportunity to participate in the care  of [place].    All questions were answered. The patient knows to call the clinic with any problems, questions or concerns. No barriers to learning were detected.  The total time spent in the encounter was 30 minutes and more than 50% was on counseling and review of test results   Ventura Sellers, MD Medical Director of Neuro-Oncology [facility] at [place] 03/21/22 12:10 PM

## 2022-03-21 NOTE — Patient Instructions (Signed)
[facility] MEDICAL ONCOLOGY   Discharge Instructions: Thank you for choosing [facility] to provide your oncology and hematology care.   If you have a lab appointment with the Cancer Center, please go directly to the Cancer Center and check in at the registration area.   Wear comfortable clothing and clothing appropriate for easy access to any Portacath or PICC line.   We strive to give you quality time with your provider. You may need to reschedule your appointment if you arrive late (15 or more minutes).  Arriving late affects you and other patients whose appointments are after yours.  Also, if you miss three or more appointments without notifying the office, you may be dismissed from the clinic at the provider's discretion.      For prescription refill requests, have your pharmacy contact our office and allow 72 hours for refills to be completed.    Today you received the following chemotherapy and/or immunotherapy agents: Bevacizumab (Avastin)       To help prevent nausea and vomiting after your treatment, we encourage you to take your nausea medication as directed.  BELOW ARE SYMPTOMS THAT SHOULD BE REPORTED IMMEDIATELY: *FEVER GREATER THAN 100.4 F (38 C) OR HIGHER *CHILLS OR SWEATING *NAUSEA AND VOMITING THAT IS NOT CONTROLLED WITH YOUR NAUSEA MEDICATION *UNUSUAL SHORTNESS OF BREATH *UNUSUAL BRUISING OR BLEEDING *URINARY PROBLEMS (pain or burning when urinating, or frequent urination) *BOWEL PROBLEMS (unusual diarrhea, constipation, pain near the anus) TENDERNESS IN MOUTH AND THROAT WITH OR WITHOUT PRESENCE OF ULCERS (sore throat, sores in mouth, or a toothache) UNUSUAL RASH, SWELLING OR PAIN  UNUSUAL VAGINAL DISCHARGE OR ITCHING   Items with * indicate a potential emergency and should be followed up as soon as possible or go to the Emergency Department if any problems should occur.  Please show the CHEMOTHERAPY ALERT CARD or IMMUNOTHERAPY ALERT CARD  at check-in to the Emergency Department and triage nurse.  Should you have questions after your visit or need to cancel or reschedule your appointment, please contact [facility]ICAL [facility]  Dept: [phone]  and follow the prompts.  Office hours are 8:00 a.m. to 4:30 p.m. Monday - Friday. Please note that voicemails left after 4:00 p.m. may not be returned until the following business day.  We are closed weekends and major holidays. You have access to a nurse at all times for urgent questions. Please call the main number to the clinic Dept: [phone] and follow the prompts.   For any non-urgent questions, you may also contact your provider using MyChart. We now offer e-Visits for anyone 18 and older to request care online for non-urgent symptoms. For details visit [email or web address].   Also download the MyChart app! Go to the app store, search "MyChart", open the app, select , and log in with your MyChart username and password.  Masks are optional in the cancer centers. If you would like for your care team to wear a mask while they are taking care of you, please let them know. You may have one support person who is at least 61 years old accompany you for your appointments. 

## 2022-03-22 ENCOUNTER — Other Ambulatory Visit: Payer: Self-pay

## 2022-03-23 ENCOUNTER — Other Ambulatory Visit: Payer: Self-pay | Admitting: Internal Medicine

## 2022-03-23 DIAGNOSIS — C719 Malignant neoplasm of brain, unspecified: Secondary | ICD-10-CM

## 2022-04-01 ENCOUNTER — Encounter: Payer: Self-pay | Admitting: Internal Medicine

## 2022-04-01 MED ORDER — LOMUSTINE 100 MG PO CAPS: 100.0000 mg | ORAL_CAPSULE | Freq: Once | ORAL | 0 refills | Status: AC

## 2022-04-01 MED ORDER — LOMUSTINE 10 MG PO CAPS: 20.0000 mg | ORAL_CAPSULE | Freq: Once | ORAL | 0 refills | Status: AC

## 2022-04-01 MED ORDER — LOMUSTINE 40 MG PO CAPS: 40.0000 mg | ORAL_CAPSULE | Freq: Once | ORAL | 0 refills | Status: AC

## 2022-04-02 ENCOUNTER — Other Ambulatory Visit: Payer: Self-pay | Admitting: Internal Medicine

## 2022-04-02 DIAGNOSIS — C719 Malignant neoplasm of brain, unspecified: Secondary | ICD-10-CM

## 2022-04-02 MED ORDER — LOMUSTINE 10 MG PO CAPS: 20.0000 mg | ORAL_CAPSULE | Freq: Once | ORAL | 0 refills | Status: AC

## 2022-04-02 MED ORDER — LOMUSTINE 100 MG PO CAPS: 100.0000 mg | ORAL_CAPSULE | Freq: Once | ORAL | 0 refills | Status: AC

## 2022-04-02 MED ORDER — LOMUSTINE 40 MG PO CAPS: 40.0000 mg | ORAL_CAPSULE | Freq: Once | ORAL | 0 refills | Status: AC

## 2022-04-04 ENCOUNTER — Inpatient Hospital Stay: Payer: Medicaid Other

## 2022-04-04 ENCOUNTER — Other Ambulatory Visit: Payer: Self-pay

## 2022-04-04 ENCOUNTER — Inpatient Hospital Stay (HOSPITAL_BASED_OUTPATIENT_CLINIC_OR_DEPARTMENT_OTHER): Payer: Medicaid Other | Admitting: Internal Medicine

## 2022-04-04 VITALS — BP 120/70 | HR 78 | Temp 98.2°F | Resp 17 | Ht 65.0 in | Wt 160.9 lb

## 2022-04-04 VITALS — BP 127/76 | HR 54 | Resp 18

## 2022-04-04 DIAGNOSIS — R569 Unspecified convulsions: Secondary | ICD-10-CM

## 2022-04-04 DIAGNOSIS — C719 Malignant neoplasm of brain, unspecified: Secondary | ICD-10-CM | POA: Diagnosis not present

## 2022-04-04 DIAGNOSIS — Z5112 Encounter for antineoplastic immunotherapy: Secondary | ICD-10-CM | POA: Diagnosis not present

## 2022-04-04 LAB — CBC WITH DIFFERENTIAL (CANCER CENTER ONLY)
Abs Immature Granulocytes: 0.01 10*3/uL (ref 0.00–0.07)
Basophils Absolute: 0 10*3/uL (ref 0.0–0.1)
Basophils Relative: 0 %
Eosinophils Absolute: 0.1 10*3/uL (ref 0.0–0.5)
Eosinophils Relative: 2 %
HCT: 41.1 % (ref 39.0–52.0)
Hemoglobin: 14.8 g/dL (ref 13.0–17.0)
Immature Granulocytes: 0 %
Lymphocytes Relative: 28 %
Lymphs Abs: 0.8 10*3/uL (ref 0.7–4.0)
MCH: 31.5 pg (ref 26.0–34.0)
MCHC: 36 g/dL (ref 30.0–36.0)
MCV: 87.4 fL (ref 80.0–100.0)
Monocytes Absolute: 0.4 10*3/uL (ref 0.1–1.0)
Monocytes Relative: 12 %
Neutro Abs: 1.7 10*3/uL (ref 1.7–7.7)
Neutrophils Relative %: 58 %
Platelet Count: 179 10*3/uL (ref 150–400)
RBC: 4.7 MIL/uL (ref 4.22–5.81)
RDW: 13.2 % (ref 11.5–15.5)
WBC Count: 2.9 10*3/uL — ABNORMAL LOW (ref 4.0–10.5)
nRBC: 0 % (ref 0.0–0.2)

## 2022-04-04 LAB — CMP (CANCER CENTER ONLY)
ALT: 35 U/L (ref 0–44)
AST: 30 U/L (ref 15–41)
Albumin: 4.4 g/dL (ref 3.5–5.0)
Alkaline Phosphatase: 91 U/L (ref 38–126)
Anion gap: 6 (ref 5–15)
BUN: 10 mg/dL (ref 8–23)
CO2: 28 mmol/L (ref 22–32)
Calcium: 9.5 mg/dL (ref 8.9–10.3)
Chloride: 105 mmol/L (ref 98–111)
Creatinine: 0.68 mg/dL (ref 0.61–1.24)
GFR, Estimated: >60 mL/min (ref >60)
Glucose, Bld: 111 mg/dL — ABNORMAL HIGH (ref 70–99)
Potassium: 4.2 mmol/L (ref 3.5–5.1)
Sodium: 139 mmol/L (ref 135–145)
Total Bilirubin: 0.4 mg/dL (ref 0.3–1.2)
Total Protein: 6.8 g/dL (ref 6.5–8.1)

## 2022-04-04 LAB — TOTAL PROTEIN, URINE DIPSTICK: Protein, ur: NEGATIVE mg/dL

## 2022-04-04 MED ORDER — SODIUM CHLORIDE 0.9 % IV SOLN
10.0000 mg/kg | Freq: Once | INTRAVENOUS | Status: AC
  Administered 2022-04-04: 700 mg via INTRAVENOUS
  Filled 2022-04-04: qty 16

## 2022-04-04 MED ORDER — SODIUM CHLORIDE 0.9 % IV SOLN: Freq: Once | INTRAVENOUS | Status: AC

## 2022-04-04 NOTE — Progress Notes (Signed)
[facility] at [place], [place] [address] [phone]   Interval Evaluation  Date of Service: 04/04/22 [name] Patient MRN: [id number] Patient DOB: [birth year] Provider: Ventura Sellers, MD  Identifying Statement:  Hunter Terry is a 61 y.o. male with left temporal glioblastoma   Oncologic History: Oncology History  Glioblastoma, IDH-wildtype ([place])  06/15/2020 Surgery   Stereotactic biopsy, L temporal Hunter Terry).  Path demonstrates glioblastoma IDH-1 wt   08/02/2020 Surgery   Debulking craniotomy with Dr. Marcello Terry   09/05/2020 - 10/17/2020 Radiation Therapy   IMRT with concurrent Temozolomide Hunter Terry)   11/19/2020 -  Chemotherapy   Initiates cycle #1 adjuvant Temozolomide       07/17/2021 -  Chemotherapy   Patient is on Treatment Plan : BRAIN Lomustine q42d     07/26/2021 -  Chemotherapy   Patient is on Treatment Plan : BRAIN GBM Bevacizumab 14d x 6 cycles       Biomarkers:  MGMT Unknown.  IDH 1/2 Wild type.  EGFR Unknown  TERT "Mutated   Interval History:  Hunter Terry presents today for follow up for avastin infusion.  No new or progressive changes today.  No balance issues, motor dysfunction.  Continues on Keppra 762m BID, no further seizures.  H+P (07/24/20) Patient presented to medical attention in late October, 2021 with new onset seizure.  Event was decribed as loss of consciousness, sudden, without clarity on further details aside from altered mental status upon awakening.  CNS imaging demonsrated non-enhancing mass within left temporal lobe c/w likely glioma; he underwent stereotactic biopsy with Dr. TMarcello Mooreson 06/15/20.  There was significant delay in finalizing path, explaining delay in follow up and evaluation.  He denies any seizures since discharge from hospital, on 4 anti-seizure drugs.  He does complain of dizziness and drowsiness with the medications, however.  Also describes impaired  short term memory.  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    Medications: Current Outpatient Medications on File Prior to Visit  Medication Sig Dispense Refill   levETIRAcetam (KEPPRA) 750 MG tablet Take 1 tablet (750 mg total) by mouth 2 (two) times daily. 60 tablet 3   ondansetron (ZOFRAN) 8 MG tablet Take 1 tablet (8 mg total) by mouth 2 (two) times daily as needed. Start on the third day after chemotherapy. 30 tablet 1   [DISCONTINUED] lacosamide (VIMPAT) 200 MG TABS tablet Take 0.5 tablets (100 mg total) by mouth 2 (two) times daily. 30 tablet 0   No current facility-administered medications on file prior to visit.    Allergies: No Known Allergies Past Medical History:  Past Medical History:  Diagnosis Date   Cancer ([place]    BRAIN TUMOR   Headache    Seizure ([facility]    Past Surgical History:  Past Surgical History:  Procedure Laterality Date   APPLICATION OF CRANIAL NAVIGATION N/A 06/15/2020   Procedure: APPLICATION OF CRANIAL NAVIGATION;  Surgeon: TVallarie Mare MD;  [place] [facility]  Service: Neurosurgery;  Laterality: N/A;   APPLICATION OF CRANIAL NAVIGATION N/A 08/02/2020   Procedure: APPLICATION OF CRANIAL NAVIGATION;  Surgeon: TVallarie Mare MD;  [place] [facility]  Service: Neurosurgery;  Laterality: N/A;   CRANIOTOMY N/A 08/02/2020   Procedure: CRANIOTOMY LEFT TEMPORAL LOBECTOMY FOR TUMOR EXCISION;  Surgeon: TVallarie Mare MD;  [place]  Service: Neurosurgery;  Laterality: N/A;   FRAMELESS  BIOPSY WITH BRAINLAB Left 06/15/2020   Procedure: Left temporal  lobe stereotactic brain biopsy with brainlab;  Surgeon: Hunter Mare, MD;  Location: [place];  Service: Neurosurgery;  Laterality: Left;   Social History:  Social History   Socioeconomic History   Marital status: Married    Spouse name: Not on file   Number of children: Not on file   Years of education: Not on file   Highest education level: Not on file  Occupational History    Not on file  Tobacco Use   Smoking status: Never   Smokeless tobacco: Never  Substance and Sexual Activity   Alcohol use: Never   Drug use: Not on file   Sexual activity: Yes    Partners: Female  Other Topics Concern   Not on file  Social History Narrative   Not on file   Social Determinants of Health   Financial Resource Strain: Not on file  Food Insecurity: Not on file  Transportation Needs: Not on file  Physical Activity: Not on file  Stress: Not on file  Social Connections: Not on file  Intimate Partner Violence: Not on file   Family History:  Family History  Problem Relation Age of Onset   Cancer Neg Hx     Review of Systems: Constitutional: Doesn't report fevers, chills or abnormal weight loss Eyes: Doesn't report blurriness of vision Ears, nose, mouth, throat, and face: Doesn't report sore throat Respiratory: Doesn't report cough, dyspnea or wheezes Cardiovascular: Doesn't report palpitation, chest discomfort  Gastrointestinal:  Doesn't report nausea, constipation, diarrhea GU: Doesn't report incontinence Skin: Doesn't report skin rashes Neurological: Per HPI Musculoskeletal: Doesn't report joint pain Behavioral/Psych: Doesn't report anxiety  Physical Exam: Vitals:   04/04/22 1345  BP: 120/70  Pulse: 78  Resp: 17  Temp: 98.2 F (36.8 C)  SpO2: 100%     KPS: 90. General: Alert, cooperative, pleasant, in no acute distress Head: Normal EENT: No conjunctival injection or scleral icterus.  Lungs: Resp effort normal Cardiac: Regular rate Abdomen: Non-distended abdomen Skin: No rashes cyanosis or petechiae. Extremities: No clubbing or edema  Neurologic Exam: Mental Status: Awake, alert, attentive to examiner. Oriented to self and environment. Language is fluent with intact comprehension.  Cranial Nerves: Visual acuity is grossly normal. Visual fields are full. Extra-ocular movements intact. No ptosis. Face is symmetric Motor: Tone and bulk are  normal. Power is full in both arms and legs. Reflexes are symmetric, no pathologic reflexes present.  Sensory: Intact to light touch Gait: Normal.   Labs: I have reviewed the data as listed    Component Value Date/Time   NA 138 03/21/2022 1144   K 4.2 03/21/2022 1144   CL 103 03/21/2022 1144   CO2 31 03/21/2022 1144   GLUCOSE 83 03/21/2022 1144   BUN 17 03/21/2022 1144   CREATININE 0.92 03/21/2022 1144   CALCIUM 9.5 03/21/2022 1144   PROT 7.4 03/21/2022 1144   ALBUMIN 4.5 03/21/2022 1144   AST 35 03/21/2022 1144   ALT 45 (H) 03/21/2022 1144   ALKPHOS 76 03/21/2022 1144   BILITOT 0.7 03/21/2022 1144   GFRNONAA >60 03/21/2022 1144   Lab Results  Component Value Date   WBC 2.9 (L) 04/04/2022   NEUTROABS 1.7 04/04/2022   HGB 14.8 04/04/2022   HCT 41.1 04/04/2022   MCV 87.4 04/04/2022   PLT 179 04/04/2022     Assessment/Plan Glioblastoma, IDH-wildtype ([place]) [C71.9]  Hunter Terry is clinically stable today, now having completed cycle #5 of CCNU/Avastin, dosed on 02/11/22.  No new or progressive changes.  We recommended continuation of second line therapy with cycle #6 CCNU 79m/m2 PO q6 weeks and Avastin 114mkg IV, now q3 weeks.    Chemotherapy should be held for the following:  ANC less than 1,000  Platelets less than 100,000  LFT or creatinine greater than 2x ULN  If clinical concerns/contraindications develop  Avastin should be held for the following:  ANC less than 500  Platelets less than 50,000  LFT or creatinine greater than 2x ULN  If clinical concerns/contraindications develop  Keppra will stay at 75016mID.  We ask that Hunter Terry to clinic in 2 weeks for avastin.  Next MRI will be post cycle #6.  We appreciate the opportunity to participate in the care of Hunter Terry  All questions were answered. The patient knows to call the clinic with any problems, questions or concerns. No barriers to learning were detected.  The total  time spent in the encounter was 30 minutes and more than 50% was on counseling and review of test results   ZacVentura SellersD Medical Director of Neuro-Oncology [facility] [place]/24/23 1:51 PM

## 2022-04-05 ENCOUNTER — Other Ambulatory Visit: Payer: Self-pay

## 2022-04-12 ENCOUNTER — Other Ambulatory Visit: Payer: Self-pay

## 2022-04-18 ENCOUNTER — Inpatient Hospital Stay: Payer: Medicaid Other

## 2022-04-18 ENCOUNTER — Telehealth: Payer: Self-pay | Admitting: *Deleted

## 2022-04-18 ENCOUNTER — Inpatient Hospital Stay: Payer: Medicaid Other | Admitting: Internal Medicine

## 2022-04-18 NOTE — Telephone Encounter (Signed)
Patient didn't show for today's visit. States that he is usually every 3 weeks however, I see that treatment plan is wrote for 2 weeks and he has been receiving both every 2 and every 3.  Will reschedule for next week.

## 2022-04-19 ENCOUNTER — Other Ambulatory Visit: Payer: Self-pay

## 2022-04-21 ENCOUNTER — Other Ambulatory Visit: Payer: Self-pay

## 2022-04-22 ENCOUNTER — Other Ambulatory Visit: Payer: Self-pay

## 2022-04-25 ENCOUNTER — Inpatient Hospital Stay (HOSPITAL_BASED_OUTPATIENT_CLINIC_OR_DEPARTMENT_OTHER): Payer: Medicaid Other | Admitting: Internal Medicine

## 2022-04-25 ENCOUNTER — Other Ambulatory Visit: Payer: Self-pay

## 2022-04-25 ENCOUNTER — Inpatient Hospital Stay: Payer: Medicaid Other

## 2022-04-25 ENCOUNTER — Inpatient Hospital Stay: Payer: Medicaid Other | Attending: Internal Medicine

## 2022-04-25 VITALS — BP 134/87 | HR 59 | Resp 18

## 2022-04-25 VITALS — BP 144/72 | HR 60 | Temp 97.7°F | Resp 16 | Ht 65.0 in | Wt 160.2 lb

## 2022-04-25 DIAGNOSIS — Z5112 Encounter for antineoplastic immunotherapy: Secondary | ICD-10-CM | POA: Insufficient documentation

## 2022-04-25 DIAGNOSIS — Z923 Personal history of irradiation: Secondary | ICD-10-CM | POA: Diagnosis not present

## 2022-04-25 DIAGNOSIS — C719 Malignant neoplasm of brain, unspecified: Secondary | ICD-10-CM

## 2022-04-25 DIAGNOSIS — R400 Somnolence: Secondary | ICD-10-CM | POA: Diagnosis not present

## 2022-04-25 DIAGNOSIS — R42 Dizziness and giddiness: Secondary | ICD-10-CM | POA: Insufficient documentation

## 2022-04-25 DIAGNOSIS — C712 Malignant neoplasm of temporal lobe: Secondary | ICD-10-CM | POA: Insufficient documentation

## 2022-04-25 DIAGNOSIS — Z9221 Personal history of antineoplastic chemotherapy: Secondary | ICD-10-CM | POA: Diagnosis not present

## 2022-04-25 DIAGNOSIS — R569 Unspecified convulsions: Secondary | ICD-10-CM | POA: Diagnosis not present

## 2022-04-25 DIAGNOSIS — Z79899 Other long term (current) drug therapy: Secondary | ICD-10-CM | POA: Diagnosis not present

## 2022-04-25 LAB — CMP (CANCER CENTER ONLY)
ALT: 52 U/L — ABNORMAL HIGH (ref 0–44)
AST: 41 U/L (ref 15–41)
Albumin: 4.5 g/dL (ref 3.5–5.0)
Alkaline Phosphatase: 113 U/L (ref 38–126)
Anion gap: 6 (ref 5–15)
BUN: 13 mg/dL (ref 8–23)
CO2: 27 mmol/L (ref 22–32)
Calcium: 9.7 mg/dL (ref 8.9–10.3)
Chloride: 104 mmol/L (ref 98–111)
Creatinine: 0.79 mg/dL (ref 0.61–1.24)
GFR, Estimated: >60 mL/min (ref >60)
Glucose, Bld: 125 mg/dL — ABNORMAL HIGH (ref 70–99)
Potassium: 4.1 mmol/L (ref 3.5–5.1)
Sodium: 137 mmol/L (ref 135–145)
Total Bilirubin: 0.6 mg/dL (ref 0.3–1.2)
Total Protein: 6.8 g/dL (ref 6.5–8.1)

## 2022-04-25 LAB — CBC WITH DIFFERENTIAL (CANCER CENTER ONLY)
Abs Immature Granulocytes: 0 10*3/uL (ref 0.00–0.07)
Basophils Absolute: 0 10*3/uL (ref 0.0–0.1)
Basophils Relative: 0 %
Eosinophils Absolute: 0 10*3/uL (ref 0.0–0.5)
Eosinophils Relative: 1 %
HCT: 41.6 % (ref 39.0–52.0)
Hemoglobin: 14.9 g/dL (ref 13.0–17.0)
Immature Granulocytes: 0 %
Lymphocytes Relative: 19 %
Lymphs Abs: 0.6 10*3/uL — ABNORMAL LOW (ref 0.7–4.0)
MCH: 31.8 pg (ref 26.0–34.0)
MCHC: 35.8 g/dL (ref 30.0–36.0)
MCV: 88.9 fL (ref 80.0–100.0)
Monocytes Absolute: 0.5 10*3/uL (ref 0.1–1.0)
Monocytes Relative: 15 %
Neutro Abs: 2.2 10*3/uL (ref 1.7–7.7)
Neutrophils Relative %: 65 %
Platelet Count: 173 10*3/uL (ref 150–400)
RBC: 4.68 MIL/uL (ref 4.22–5.81)
RDW: 13.2 % (ref 11.5–15.5)
WBC Count: 3.4 10*3/uL — ABNORMAL LOW (ref 4.0–10.5)
nRBC: 0 % (ref 0.0–0.2)

## 2022-04-25 LAB — TOTAL PROTEIN, URINE DIPSTICK: Protein, ur: NEGATIVE mg/dL

## 2022-04-25 MED ORDER — SODIUM CHLORIDE 0.9 % IV SOLN: Freq: Once | INTRAVENOUS | Status: AC

## 2022-04-25 MED ORDER — SODIUM CHLORIDE 0.9 % IV SOLN
10.0000 mg/kg | Freq: Once | INTRAVENOUS | Status: AC
  Administered 2022-04-25: 700 mg via INTRAVENOUS
  Filled 2022-04-25: qty 16

## 2022-04-25 NOTE — Progress Notes (Signed)
[facility] at [facility] [address]  [place], [place] [address] [phone]   Interval Evaluation  Date of Service: 04/25/22 [name] Patient MRN: [id number] Patient DOB: Apr 12, 1961 Provider: Henreitta Leber, MD  Identifying Statement:  Hunter Terry is a 62 y.o. male with left temporal glioblastoma   Oncologic History: Oncology History  Glioblastoma, IDH-wildtype (HCC)  06/15/2020 Surgery   Stereotactic biopsy, L temporal Maisie Fus).  Path demonstrates glioblastoma IDH-1 wt   08/02/2020 Surgery   Debulking craniotomy with Dr. Maisie Fus   09/05/2020 - 10/17/2020 Radiation Therapy   IMRT with concurrent Temozolomide [name])   11/19/2020 -  Chemotherapy   Initiates cycle #1 adjuvant Temozolomide       07/17/2021 -  Chemotherapy   Patient is on Treatment Plan : BRAIN Lomustine q42d     07/26/2021 -  Chemotherapy   Patient is on Treatment Plan : BRAIN GBM Bevacizumab 14d x 6 cycles       Biomarkers:  MGMT Unknown.  IDH 1/2 Wild type.  EGFR Unknown  TERT "Mutated   Interval History:  Hunter Terry presents today for follow up for avastin infusion.  No clinical changes today.  No balance issues, motor dysfunction.  Dosed cycle 6 on 8/27.  Continues on Keppra 750mg  BID, no further seizures.  H+P (07/24/20) Patient presented to medical attention in late October, 2021 with new onset seizure.  Event was decribed as loss of consciousness, sudden, without clarity on further details aside from altered mental status upon awakening.  CNS imaging demonsrated non-enhancing mass within left temporal lobe c/w likely glioma; he underwent stereotactic biopsy with Dr. 10[birth year]29[birth year]1989 on 06/15/20.  There was significant delay in finalizing path, explaining delay in follow up and evaluation.  He denies any seizures since discharge from hospital, on 4 anti-seizure drugs.  He does complain of dizziness and drowsiness with the medications, however.  Also  describes impaired short term memory.  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    Medications: Current Outpatient Medications on File Prior to Visit  Medication Sig Dispense Refill   levETIRAcetam (KEPPRA) 750 MG tablet Take 1 tablet (750 mg total) by mouth 2 (two) times daily. 60 tablet 3   ondansetron (ZOFRAN) 8 MG tablet Take 1 tablet (8 mg total) by mouth 2 (two) times daily as needed. Start on the third day after chemotherapy. 30 tablet 1   [DISCONTINUED] lacosamide (VIMPAT) 200 MG TABS tablet Take 0.5 tablets (100 mg total) by mouth 2 (two) times daily. 30 tablet 0   No current facility-administered medications on file prior to visit.    Allergies: No Known Allergies Past Medical History:  Past Medical History:  Diagnosis Date   Cancer (HCC)    BRAIN TUMOR   Headache    Seizure [facility])    Past Surgical History:  Past Surgical History:  Procedure Laterality Date   APPLICATION OF CRANIAL NAVIGATION N/A 06/15/2020   Procedure: APPLICATION OF CRANIAL NAVIGATION;  Surgeon: 13/11/2019, MD;  Location: [facility] OR;  Service: Neurosurgery;  Laterality: N/A;   APPLICATION OF CRANIAL NAVIGATION N/A 08/02/2020   Procedure: APPLICATION OF CRANIAL NAVIGATION;  Surgeon: 08/04/2020, MD;  Location: [facility] OR;  Service: Neurosurgery;  Laterality: N/A;   CRANIOTOMY N/A 08/02/2020   Procedure: CRANIOTOMY LEFT TEMPORAL LOBECTOMY FOR TUMOR EXCISION;  Surgeon: 08/04/2020, MD;  Location: [facility] OR;  Service: Neurosurgery;  Laterality: N/A;   FRAMELESS  BIOPSY WITH BRAINLAB Left 06/15/2020  Procedure: Left temporal lobe stereotactic brain biopsy with brainlab;  Surgeon: [name], MD;  Location: [place];  Service: Neurosurgery;  Laterality: Left;   Social History:  Social History   Socioeconomic History   Marital status: Married    Spouse name: Not on file   Number of children: Not on file   Years of education: Not on file   Highest education level: Not on file   Occupational History   Not on file  Tobacco Use   Smoking status: Never   Smokeless tobacco: Never  Substance and Sexual Activity   Alcohol use: Never   Drug use: Not on file   Sexual activity: Yes    Partners: Female  Other Topics Concern   Not on file  Social History Narrative   Not on file   Social Determinants of Health   Financial Resource Strain: Not on file  Food Insecurity: Not on file  Transportation Needs: Not on file  Physical Activity: Not on file  Stress: Not on file  Social Connections: Not on file  Intimate Partner Violence: Not on file   Family History:  Family History  Problem Relation Age of Onset   Cancer Neg Hx     Review of Systems: Constitutional: Doesn't report fevers, chills or abnormal weight loss Eyes: Doesn't report blurriness of vision Ears, nose, mouth, throat, and face: Doesn't report sore throat Respiratory: Doesn't report cough, dyspnea or wheezes Cardiovascular: Doesn't report palpitation, chest discomfort  Gastrointestinal:  Doesn't report nausea, constipation, diarrhea GU: Doesn't report incontinence Skin: Doesn't report skin rashes Neurological: Per HPI Musculoskeletal: Doesn't report joint pain Behavioral/Psych: Doesn't report anxiety  Physical Exam: Vitals:   04/25/22 1452  BP: (!) 144/72  Pulse: 60  Resp: 16  Temp: 97.7 F (36.5 C)  SpO2: 98%     KPS: 90. General: Alert, cooperative, pleasant, in no acute distress Head: Normal EENT: No conjunctival injection or scleral icterus.  Lungs: Resp effort normal Cardiac: Regular rate Abdomen: Non-distended abdomen Skin: No rashes cyanosis or petechiae. Extremities: No clubbing or edema  Neurologic Exam: Mental Status: Awake, alert, attentive to examiner. Oriented to self and environment. Language is fluent with intact comprehension.  Cranial Nerves: Visual acuity is grossly normal. Visual fields are full. Extra-ocular movements intact. No ptosis. Face is  symmetric Motor: Tone and bulk are normal. Power is full in both arms and legs. Reflexes are symmetric, no pathologic reflexes present.  Sensory: Intact to light touch Gait: Normal.   Labs: I have reviewed the data as listed    Component Value Date/Time   NA 139 04/04/2022 1330   K 4.2 04/04/2022 1330   CL 105 04/04/2022 1330   CO2 28 04/04/2022 1330   GLUCOSE 111 (H) 04/04/2022 1330   BUN 10 04/04/2022 1330   CREATININE 0.68 04/04/2022 1330   CALCIUM 9.5 04/04/2022 1330   PROT 6.8 04/04/2022 1330   ALBUMIN 4.4 04/04/2022 1330   AST 30 04/04/2022 1330   ALT 35 04/04/2022 1330   ALKPHOS 91 04/04/2022 1330   BILITOT 0.4 04/04/2022 1330   GFRNONAA >60 04/04/2022 1330   Lab Results  Component Value Date   WBC 3.4 (L) 04/25/2022   NEUTROABS 2.2 04/25/2022   HGB 14.9 04/25/2022   HCT 41.6 04/25/2022   MCV 88.9 04/25/2022   PLT 173 04/25/2022     Assessment/Plan Glioblastoma, IDH-wildtype ([place]) [C71.9]  Hunter Terry is clinically stable today, now having dosed cycle #6 of CCNU/Avastin, dosed on 04/07/22.  No new  or progressive changes.  We recommended continuation of second line therapy with cycle #6 CCNU $Remov'90mg'pCekzF$ /m2 PO q6 weeks and Avastin $RemoveBef'10mg'GNaNZZHqsa$ /kg IV, now q3 weeks.    Chemotherapy should be held for the following:  ANC less than 1,000  Platelets less than 100,000  LFT or creatinine greater than 2x ULN  If clinical concerns/contraindications develop  Avastin should be held for the following:  ANC less than 500  Platelets less than 50,000  LFT or creatinine greater than 2x ULN  If clinical concerns/contraindications develop  Keppra will stay at $Remov'750mg'NcKGLa$  BID.  We ask that Hunter Terry return to clinic in 3 weeks for avastin.  MRI scheduled for 05/10/22.  We appreciate the opportunity to participate in the care of Hunter Terry.    All questions were answered. The patient knows to call the clinic with any problems, questions or concerns. No barriers to learning  were detected.  The total time spent in the encounter was 30 minutes and more than 50% was on counseling and review of test results   Ventura Sellers, MD Medical Director of Neuro-Oncology [facility] at [place] 04/25/22 2:57 PM

## 2022-04-25 NOTE — Patient Instructions (Signed)
Instrucciones al darle de alta: Discharge Instructions Gracias por elegir al [facility] [place] para brindarle atencin mdica de oncologa y hematologa.   Si usted tiene una cita de laboratorio con el [facility], por favor vaya directamente al [facility] y regstrese en el rea de registro.   Use ropa cmoda y adecuada para tener fcil acceso a las vas del Portacath (acceso venoso de larga duracin) o la lnea PICC (catter central colocado por va perifrica).   Nos esforzamos por ofrecerle tiempo de calidad con su proveedor. Es posible que tenga que volver a programar su cita si llega tarde (15 minutos o ms).  El llegar tarde le afecta a usted y a otros pacientes cuyas citas son posteriores a la suya.  Adems, si usted falta a tres o ms citas sin avisar a la oficina, puede ser retirado(a) de la clnica a discrecin del proveedor.      Para las solicitudes de renovacin de recetas, pida a su farmacia que se ponga en contacto con nuestra oficina y deje que transcurran 72 horas para que se complete el proceso de las renovaciones.    Hoy usted recibi los siguientes agentes de quimioterapia e/o inmunoterapia: [place].       Para ayudar a prevenir las nuseas y los vmitos despus de su tratamiento, le recomendamos que tome su medicamento para las nuseas segn las indicaciones.  LOS SNTOMAS QUE DEBEN COMUNICARSE INMEDIATAMENTE SE INDICAN A CONTINUACIN: *FIEBRE SUPERIOR A 100.4 F (38 C) O MS *ESCALOFROS O SUDORACIN *NUSEAS Y VMITOS QUE NO SE CONTROLAN CON EL MEDICAMENTO PARA LAS NUSEAS *DIFICULTAD INUSUAL PARA RESPIRAR  *MORETONES O HEMORRAGIAS NO HABITUALES *PROBLEMAS URINARIOS (dolor o ardor al orinar o frecuencia para orinar) *PROBLEMAS INTESTINALES (diarrea inusual, estreimiento, dolor cerca del ano) SENSIBILIDAD EN LA BOCA Y EN LA GARGANTA CON O SIN LA PRESENCIA DE LCERAS (dolor de garganta, llagas en la boca o dolor de muelas/dientes) ERUPCIN,  HINCHAZN O DOLORES INUSUALES FLUJO VAGINAL INUSUAL O PICAZN/RASQUIA    Los puntos marcados con un asterisco ( *) indican una posible emergencia y debe hacer un seguimiento tan pronto como le sea posible o vaya al Departamento de Emergencias si se le presenta algn problema.  Por favor, muestre la TARJETA DE ADVERTENCIA DE QUIMIOTERAPIA O LA TARJETA DE ADVERTENCIA DE INMUNOTERAPIA al registrarse en el Departamento de Emergencias y a la enfermera de triaje.  Si tiene preguntas despus de su visita o necesita cancelar o volver a programar su cita, por favor pngase en contacto con [facility]  Dept: [phone] y siga las instrucciones. Las horas de oficina son de 8:00 a.m. a 4:30 p.m. de lunes a viernes. Por favor, tenga en cuenta que los mensajes de voz que se dejan despus de las 4:00 p.m. posiblemente no se devolvern hasta el siguiente da de trabajo.  Cerramos los fines de semana y los das festivos importantes. En todo momento tiene acceso a una enfermera para preguntas urgentes. Por favor, llame al nmero principal de la clnica Dept: [phone] y siga las instrucciones.   Para cualquier pregunta que no sea de carcter urgente, tambin puede ponerse en contacto con su proveedor utilizando MyChart. Ahora ofrecemos visitas electrnicas para cualquier persona mayor de 18 aos que solicite atencin mdica en lnea para los sntomas que no sean urgentes. Para ms detalles vaya a [email or web address].   Tambin puede bajar la aplicacin de MyChart! Vaya a la tienda de aplicaciones, busque "MyChart", abra la aplicacin,   seleccione [place], e ingrese con su nombre de usuario y la contrasea de Pharmacist, community.  Las mscaras son opcionales en los centros de Hotel manager. Si desea que su equipo de cuidados mdicos use una [employer] atienden, por favor hgaselo saber al personal. Bethann Berkshire una persona de apoyo que tenga por lo menos 16 aos para que le acompae a sus  citas.

## 2022-04-27 ENCOUNTER — Other Ambulatory Visit: Payer: Self-pay

## 2022-04-30 ENCOUNTER — Other Ambulatory Visit: Payer: Self-pay

## 2022-05-01 ENCOUNTER — Telehealth: Payer: Self-pay

## 2022-05-01 NOTE — Telephone Encounter (Signed)
Spoke with patient to provide phone number to Centralized Scheduling. MRI has expected date of 05/10/22/ He will call to schedule.

## 2022-05-08 ENCOUNTER — Ambulatory Visit (HOSPITAL_COMMUNITY)
Admission: RE | Admit: 2022-05-08 | Discharge: 2022-05-08 | Disposition: A | Discharge status: Home / Self Care | Payer: Medicaid Other | Source: Ambulatory Visit | Attending: Internal Medicine | Admitting: Internal Medicine

## 2022-05-08 DIAGNOSIS — C719 Malignant neoplasm of brain, unspecified: Secondary | ICD-10-CM | POA: Diagnosis present

## 2022-05-08 MED ORDER — GADOPICLENOL 0.5 MMOL/ML IV SOLN
7.0000 mL | Freq: Once | INTRAVENOUS | Status: AC | PRN
  Administered 2022-05-08: 7 mL via INTRAVENOUS

## 2022-05-09 ENCOUNTER — Other Ambulatory Visit: Payer: Self-pay | Admitting: Radiation Therapy

## 2022-05-12 ENCOUNTER — Other Ambulatory Visit: Payer: Self-pay

## 2022-05-13 ENCOUNTER — Inpatient Hospital Stay: Payer: Medicaid Other | Attending: Internal Medicine

## 2022-05-13 DIAGNOSIS — Z79899 Other long term (current) drug therapy: Secondary | ICD-10-CM | POA: Insufficient documentation

## 2022-05-13 DIAGNOSIS — D696 Thrombocytopenia, unspecified: Secondary | ICD-10-CM | POA: Insufficient documentation

## 2022-05-13 DIAGNOSIS — Z9221 Personal history of antineoplastic chemotherapy: Secondary | ICD-10-CM | POA: Insufficient documentation

## 2022-05-13 DIAGNOSIS — Z5112 Encounter for antineoplastic immunotherapy: Secondary | ICD-10-CM | POA: Insufficient documentation

## 2022-05-13 DIAGNOSIS — R55 Syncope and collapse: Secondary | ICD-10-CM | POA: Insufficient documentation

## 2022-05-13 DIAGNOSIS — R748 Abnormal levels of other serum enzymes: Secondary | ICD-10-CM | POA: Insufficient documentation

## 2022-05-13 DIAGNOSIS — C712 Malignant neoplasm of temporal lobe: Secondary | ICD-10-CM | POA: Insufficient documentation

## 2022-05-13 DIAGNOSIS — Z923 Personal history of irradiation: Secondary | ICD-10-CM | POA: Insufficient documentation

## 2022-05-16 ENCOUNTER — Inpatient Hospital Stay: Payer: Medicaid Other

## 2022-05-16 ENCOUNTER — Other Ambulatory Visit: Payer: Self-pay

## 2022-05-16 ENCOUNTER — Inpatient Hospital Stay (HOSPITAL_BASED_OUTPATIENT_CLINIC_OR_DEPARTMENT_OTHER): Payer: Medicaid Other | Admitting: Internal Medicine

## 2022-05-16 VITALS — BP 125/62 | HR 63 | Temp 98.1°F | Resp 16 | Ht 65.0 in | Wt 158.6 lb

## 2022-05-16 DIAGNOSIS — Z9221 Personal history of antineoplastic chemotherapy: Secondary | ICD-10-CM | POA: Diagnosis not present

## 2022-05-16 DIAGNOSIS — C719 Malignant neoplasm of brain, unspecified: Secondary | ICD-10-CM | POA: Diagnosis not present

## 2022-05-16 DIAGNOSIS — C712 Malignant neoplasm of temporal lobe: Secondary | ICD-10-CM | POA: Diagnosis not present

## 2022-05-16 DIAGNOSIS — R569 Unspecified convulsions: Secondary | ICD-10-CM

## 2022-05-16 DIAGNOSIS — R748 Abnormal levels of other serum enzymes: Secondary | ICD-10-CM | POA: Diagnosis not present

## 2022-05-16 DIAGNOSIS — Z5112 Encounter for antineoplastic immunotherapy: Secondary | ICD-10-CM | POA: Diagnosis present

## 2022-05-16 DIAGNOSIS — D696 Thrombocytopenia, unspecified: Secondary | ICD-10-CM | POA: Diagnosis not present

## 2022-05-16 DIAGNOSIS — R55 Syncope and collapse: Secondary | ICD-10-CM | POA: Diagnosis not present

## 2022-05-16 DIAGNOSIS — Z79899 Other long term (current) drug therapy: Secondary | ICD-10-CM | POA: Diagnosis not present

## 2022-05-16 DIAGNOSIS — Z923 Personal history of irradiation: Secondary | ICD-10-CM | POA: Diagnosis not present

## 2022-05-16 LAB — CMP (CANCER CENTER ONLY)
ALT: 331 U/L (ref 0–44)
AST: 174 U/L (ref 15–41)
Albumin: 4.4 g/dL (ref 3.5–5.0)
Alkaline Phosphatase: 278 U/L — ABNORMAL HIGH (ref 38–126)
Anion gap: 5 (ref 5–15)
BUN: 9 mg/dL (ref 8–23)
CO2: 27 mmol/L (ref 22–32)
Calcium: 9.1 mg/dL (ref 8.9–10.3)
Chloride: 103 mmol/L (ref 98–111)
Creatinine: 0.79 mg/dL (ref 0.61–1.24)
GFR, Estimated: >60 mL/min (ref >60)
Glucose, Bld: 104 mg/dL — ABNORMAL HIGH (ref 70–99)
Potassium: 4.6 mmol/L (ref 3.5–5.1)
Sodium: 135 mmol/L (ref 135–145)
Total Bilirubin: 0.8 mg/dL (ref 0.3–1.2)
Total Protein: 7.5 g/dL (ref 6.5–8.1)

## 2022-05-16 LAB — CBC WITH DIFFERENTIAL (CANCER CENTER ONLY)
Abs Immature Granulocytes: 0.01 10*3/uL (ref 0.00–0.07)
Basophils Absolute: 0 10*3/uL (ref 0.0–0.1)
Basophils Relative: 0 %
Eosinophils Absolute: 0.2 10*3/uL (ref 0.0–0.5)
Eosinophils Relative: 7 %
HCT: 42.3 % (ref 39.0–52.0)
Hemoglobin: 15.1 g/dL (ref 13.0–17.0)
Immature Granulocytes: 0 %
Lymphocytes Relative: 21 %
Lymphs Abs: 0.6 10*3/uL — ABNORMAL LOW (ref 0.7–4.0)
MCH: 32.1 pg (ref 26.0–34.0)
MCHC: 35.7 g/dL (ref 30.0–36.0)
MCV: 89.8 fL (ref 80.0–100.0)
Monocytes Absolute: 0.5 10*3/uL (ref 0.1–1.0)
Monocytes Relative: 19 %
Neutro Abs: 1.4 10*3/uL — ABNORMAL LOW (ref 1.7–7.7)
Neutrophils Relative %: 53 %
Platelet Count: 136 10*3/uL — ABNORMAL LOW (ref 150–400)
RBC: 4.71 MIL/uL (ref 4.22–5.81)
RDW: 13.1 % (ref 11.5–15.5)
WBC Count: 2.7 10*3/uL — ABNORMAL LOW (ref 4.0–10.5)
nRBC: 0 % (ref 0.0–0.2)

## 2022-05-16 LAB — TOTAL PROTEIN, URINE DIPSTICK: Protein, ur: NEGATIVE mg/dL

## 2022-05-16 MED ORDER — SODIUM CHLORIDE 0.9 % IV SOLN
10.0000 mg/kg | Freq: Once | INTRAVENOUS | Status: AC
  Administered 2022-05-16: 700 mg via INTRAVENOUS
  Filled 2022-05-16: qty 16

## 2022-05-16 MED ORDER — SODIUM CHLORIDE 0.9 % IV SOLN: Freq: Once | INTRAVENOUS | Status: AC

## 2022-05-16 MED ORDER — LOMUSTINE 100 MG PO CAPS: 100.0000 mg | ORAL_CAPSULE | Freq: Once | ORAL | 0 refills | Status: AC

## 2022-05-16 MED ORDER — LOMUSTINE 40 MG PO CAPS: 40.0000 mg | ORAL_CAPSULE | Freq: Once | ORAL | 0 refills | Status: AC

## 2022-05-16 MED ORDER — LOMUSTINE 10 MG PO CAPS: 20.0000 mg | ORAL_CAPSULE | Freq: Once | ORAL | 0 refills | Status: AC

## 2022-05-16 NOTE — Progress Notes (Signed)
[facility] [place], [place] [address] [phone]   Interval Evaluation  Date of Service: 05/16/22 [name] Patient MRN: [id number] Patient DOB: 07/06/1961 Provider: Ventura Sellers, MD  Identifying Statement:  Hunter Terry is a 61 y.o. male with left temporal glioblastoma   Oncologic History: Oncology History  Glioblastoma, IDH-wildtype ([place])  06/15/2020 Surgery   Stereotactic biopsy, L temporal [name]).  Path demonstrates glioblastoma IDH-1 wt   08/02/2020 Surgery   Debulking craniotomy with Dr. [name]   09/05/2020 - 10/17/2020 Radiation Therapy   IMRT with concurrent Temozolomide [name])   11/19/2020 -  Chemotherapy   Initiates cycle #1 adjuvant Temozolomide       07/17/2021 -  Chemotherapy   Patient is on Treatment Plan : BRAIN Lomustine q42d     07/26/2021 -  Chemotherapy   Patient is on Treatment Plan : BRAIN GBM Bevacizumab 14d x 6 cycles       Biomarkers:  MGMT Unknown.  IDH 1/2 Wild type.  EGFR Unknown  TERT "Mutated   Interval History:  Hunter Terry presents today for follow up for avastin infusion.  No clinical changes today, but he does endorse mild cough/cold symptoms.  No balance issues, motor dysfunction.  Dosed cycle 6 on 8/27.  Continues on Keppra $RemoveB'750mg'ABFWmeiK$  BID, no further seizures.  H+P (07/24/20) Patient presented to medical attention in late October, 2021 with new onset seizure.  Event was decribed as loss of consciousness, sudden, without clarity on further details aside from altered mental status upon awakening.  CNS imaging demonsrated non-enhancing mass within left temporal lobe c/w likely glioma; he underwent stereotactic biopsy with Dr. [name] on 06/15/20.  There was significant delay in finalizing path, explaining delay in follow up and evaluation.  He denies any seizures since discharge from hospital, on 4 anti-seizure drugs.  He does complain of dizziness and  drowsiness with the medications, however.  Also describes impaired short term memory.  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    Medications: Current Outpatient Medications on File Prior to Visit  Medication Sig Dispense Refill   levETIRAcetam (KEPPRA) 750 MG tablet Take 1 tablet (750 mg total) by mouth 2 (two) times daily. 60 tablet 3   ondansetron (ZOFRAN) 8 MG tablet Take 1 tablet (8 mg total) by mouth 2 (two) times daily as needed. Start on the third day after chemotherapy. 30 tablet 1   [DISCONTINUED] lacosamide (VIMPAT) 200 MG TABS tablet Take 0.5 tablets (100 mg total) by mouth 2 (two) times daily. 30 tablet 0   No current facility-administered medications on file prior to visit.    Allergies: No Known Allergies Past Medical History:  Past Medical History:  Diagnosis Date   Cancer ([place])    BRAIN TUMOR   Headache    Seizure [facility])    Past Surgical History:  Past Surgical History:  Procedure Laterality Date   APPLICATION OF CRANIAL NAVIGATION N/A 06/15/2020   Procedure: APPLICATION OF CRANIAL NAVIGATION;  Surgeon: [name], MD;  Location: [place];  Service: Neurosurgery;  Laterality: N/A;   APPLICATION OF CRANIAL NAVIGATION N/A 08/02/2020   Procedure: APPLICATION OF CRANIAL NAVIGATION;  Surgeon: [name], MD;  Location: [place];  Service: Neurosurgery;  Laterality: N/A;   CRANIOTOMY N/A 08/02/2020   Procedure: CRANIOTOMY LEFT TEMPORAL LOBECTOMY FOR TUMOR EXCISION;  Surgeon: [name], MD;  Location: [place];  Service: Neurosurgery;  Laterality: N/A;   FRAMELESS  BIOPSY WITH BRAINLAB Left 06/15/2020   Procedure: Left temporal lobe stereotactic brain biopsy with brainlab;  Surgeon: [name], MD;  Location: [place];  Service: Neurosurgery;  Laterality: Left;   Social History:  Social History   Socioeconomic History   Marital status: Married    Spouse name: Not on file   Number of children: Not on file   Years of education: Not on file    Highest education level: Not on file  Occupational History   Not on file  Tobacco Use   Smoking status: Never   Smokeless tobacco: Never  Substance and Sexual Activity   Alcohol use: Never   Drug use: Not on file   Sexual activity: Yes    Partners: Female  Other Topics Concern   Not on file  Social History Narrative   Not on file   Social Determinants of Health   Financial Resource Strain: Not on file  Food Insecurity: Not on file  Transportation Needs: Not on file  Physical Activity: Not on file  Stress: Not on file  Social Connections: Not on file  Intimate Partner Violence: Not on file   Family History:  Family History  Problem Relation Age of Onset   Cancer Neg Hx     Review of Systems: Constitutional: Doesn't report fevers, chills or abnormal weight loss Eyes: Doesn't report blurriness of vision Ears, nose, mouth, throat, and face: Doesn't report sore throat Respiratory: Doesn't report cough, dyspnea or wheezes Cardiovascular: Doesn't report palpitation, chest discomfort  Gastrointestinal:  Doesn't report nausea, constipation, diarrhea GU: Doesn't report incontinence Skin: Doesn't report skin rashes Neurological: Per HPI Musculoskeletal: Doesn't report joint pain Behavioral/Psych: Doesn't report anxiety  Physical Exam: Vitals:   05/16/22 1219  BP: 125/62  Pulse: 63  Resp: 16  Temp: 98.1 F (36.7 C)  SpO2: 99%     KPS: 90. General: Alert, cooperative, pleasant, in no acute distress Head: Normal EENT: No conjunctival injection or scleral icterus.  Lungs: Resp effort normal Cardiac: Regular rate Abdomen: Non-distended abdomen Skin: No rashes cyanosis or petechiae. Extremities: No clubbing or edema  Neurologic Exam: Mental Status: Awake, alert, attentive to examiner. Oriented to self and environment. Language is fluent with intact comprehension.  Cranial Nerves: Visual acuity is grossly normal. Visual fields are full. Extra-ocular movements  intact. No ptosis. Face is symmetric Motor: Tone and bulk are normal. Power is full in both arms and legs. Reflexes are symmetric, no pathologic reflexes present.  Sensory: Intact to light touch Gait: Normal.   Labs: I have reviewed the data as listed    Component Value Date/Time   NA 137 04/25/2022 1437   K 4.1 04/25/2022 1437   CL 104 04/25/2022 1437   CO2 27 04/25/2022 1437   GLUCOSE 125 (H) 04/25/2022 1437   BUN 13 04/25/2022 1437   CREATININE 0.79 04/25/2022 1437   CALCIUM 9.7 04/25/2022 1437   PROT 6.8 04/25/2022 1437   ALBUMIN 4.5 04/25/2022 1437   AST 41 04/25/2022 1437   ALT 52 (H) 04/25/2022 1437   ALKPHOS 113 04/25/2022 1437   BILITOT 0.6 04/25/2022 1437   GFRNONAA >60 04/25/2022 1437   Lab Results  Component Value Date   WBC 2.7 (L) 05/16/2022   NEUTROABS 1.4 (L) 05/16/2022   HGB 15.1 05/16/2022   HCT 42.3 05/16/2022   MCV 89.8 05/16/2022   PLT 136 (L) 05/16/2022    Imaging:  [place] Clinician Interpretation: I have personally reviewed the CNS images as listed.  My interpretation,  in the context of the patient's clinical presentation, is treatment effect vs true progression  MR BRAIN W WO CONTRAST  Result Date: 05/10/2022 CLINICAL DATA:  61 year old male with ID H wild type glioblastoma. Stable MRI appearance of disease since January. Restaging. EXAM: MRI HEAD WITHOUT AND WITH CONTRAST TECHNIQUE: Multiplanar, multiecho pulse sequences of the brain and surrounding structures were obtained without and with intravenous contrast. CONTRAST:  7 mL [place] COMPARISON:  Brain MRI 02/02/2022 and earlier. FINDINGS: Brain: Postoperative changes to the left temporal lobe. Regional T2 and FLAIR heterogeneity and hyperintensity is stable since March. No new or increased mass effect since that time. On axial postcontrast, faint nodular enhancement along the posterior resection margin has not significantly changed (such as on series 16, image 64). No suspicious DWI changes. However,  on both coronal and sagittal postcontrast images there is subtle increased solid and nodular enhancement along the superior resection margin, such as series 17, image 17. No regional mass effect. Mild ex vacuo enlargement of the left lateral ventricle is stable. No new signal changes identified. No restricted diffusion suggestive of acute infarction. No ventriculomegaly, extra-axial collection or acute intracranial hemorrhage. Cervicomedullary junction and pituitary are within normal limits. Vascular: Major intracranial vascular flow voids are stable. Major dural venous sinuses are enhancing and appear to be patent. Skull and upper cervical spine: Previous left craniotomy. Visualized bone marrow signal is within normal limits. Negative visible cervical spine and spinal cord. Sinuses/Orbits: Stable, negative. Other: Mastoids remain clear. Visible internal auditory structures appear normal. Stable visible scalp soft tissues. IMPRESSION: 1. Subtle increased enhancement along the superior resection margin since March, only apparent on sagittal and coronal postcontrast T1. And otherwise stable post treatment appearance of the left hemisphere. Recommend continued MRI surveillance. 2. No new intracranial abnormality. Electronically Signed   By: Genevie Ann M.D.   On: 05/10/2022 11:50     Assessment/Plan Glioblastoma, IDH-wildtype ([place]) [C71.9]  Cote Ambrocio is clinically stable today, now having completed cycle #6 of CCNU/Avastin.  No new or progressive neurologic changes, he is recovering from viral URI currently.  MRI brain demonstrates very subtle increase in enhancement adjacent to resection cavity in left temporal lobe.  We will keep a close watch on this region.  We recommended continuation of second line therapy with cycle #7 CCNU $Remov'90mg'rGetUz$ /m2 PO q6 weeks and Avastin $RemoveBef'10mg'NEyrFyPZkO$ /kg IV, now q3 weeks.    CCNU will be deferred today because of relative neutropenia, thrombocytopenia, elevated liver enzymes.  Avastin will be  given today.  Chemotherapy should be held for the following:  ANC less than 1,000  Platelets less than 100,000  LFT or creatinine greater than 2x ULN  If clinical concerns/contraindications develop  Avastin should be held for the following:  ANC less than 500  Platelets less than 50,000  LFT or creatinine greater than 2x ULN  If clinical concerns/contraindications develop  Keppra will stay at $Remov'750mg'FEkXfs$  BID.  We ask that Hunter Terry return to clinic in 3 weeks for cycle #8 CCNU and avastin.  MRI should be scheduled for following cycle #8 given changes on scan today.  We appreciate the opportunity to participate in the care of [place].    All questions were answered. The patient knows to call the clinic with any problems, questions or concerns. No barriers to learning were detected.  The total time spent in the encounter was 40 minutes and more than 50% was on counseling and review of test results   Ventura Sellers, MD Medical Director  of Neuro-Oncology [facility] at [place] 05/16/22 12:43 PM

## 2022-05-16 NOTE — Progress Notes (Signed)
Ok to treat today with Avastin 05/16/2022 with lab values and vital signs per Dr Mickeal Skinner.

## 2022-05-19 ENCOUNTER — Other Ambulatory Visit: Payer: Self-pay

## 2022-05-21 ENCOUNTER — Other Ambulatory Visit: Payer: Self-pay

## 2022-05-22 ENCOUNTER — Other Ambulatory Visit: Payer: Self-pay

## 2022-05-23 ENCOUNTER — Other Ambulatory Visit: Payer: Self-pay

## 2022-05-30 ENCOUNTER — Other Ambulatory Visit: Payer: Self-pay

## 2022-05-30 ENCOUNTER — Inpatient Hospital Stay (HOSPITAL_BASED_OUTPATIENT_CLINIC_OR_DEPARTMENT_OTHER): Payer: Medicaid Other | Admitting: Internal Medicine

## 2022-05-30 ENCOUNTER — Inpatient Hospital Stay: Payer: Medicaid Other

## 2022-05-30 VITALS — BP 131/86 | HR 71 | Resp 15 | Wt 156.9 lb

## 2022-05-30 DIAGNOSIS — C719 Malignant neoplasm of brain, unspecified: Secondary | ICD-10-CM

## 2022-05-30 DIAGNOSIS — Z5112 Encounter for antineoplastic immunotherapy: Secondary | ICD-10-CM | POA: Diagnosis not present

## 2022-05-30 LAB — CBC WITH DIFFERENTIAL (CANCER CENTER ONLY)
Abs Immature Granulocytes: 0.01 10*3/uL (ref 0.00–0.07)
Basophils Absolute: 0 10*3/uL (ref 0.0–0.1)
Basophils Relative: 0 %
Eosinophils Absolute: 0.2 10*3/uL (ref 0.0–0.5)
Eosinophils Relative: 3 %
HCT: 41.1 % (ref 39.0–52.0)
Hemoglobin: 14.9 g/dL (ref 13.0–17.0)
Immature Granulocytes: 0 %
Lymphocytes Relative: 15 %
Lymphs Abs: 0.7 10*3/uL (ref 0.7–4.0)
MCH: 32 pg (ref 26.0–34.0)
MCHC: 36.3 g/dL — ABNORMAL HIGH (ref 30.0–36.0)
MCV: 88.4 fL (ref 80.0–100.0)
Monocytes Absolute: 0.6 10*3/uL (ref 0.1–1.0)
Monocytes Relative: 13 %
Neutro Abs: 3.2 10*3/uL (ref 1.7–7.7)
Neutrophils Relative %: 69 %
Platelet Count: 179 10*3/uL (ref 150–400)
RBC: 4.65 MIL/uL (ref 4.22–5.81)
RDW: 12.4 % (ref 11.5–15.5)
WBC Count: 4.7 10*3/uL (ref 4.0–10.5)
nRBC: 0 % (ref 0.0–0.2)

## 2022-05-30 LAB — CMP (CANCER CENTER ONLY)
ALT: 102 U/L — ABNORMAL HIGH (ref 0–44)
AST: 74 U/L — ABNORMAL HIGH (ref 15–41)
Albumin: 4.4 g/dL (ref 3.5–5.0)
Alkaline Phosphatase: 208 U/L — ABNORMAL HIGH (ref 38–126)
Anion gap: 5 (ref 5–15)
BUN: 9 mg/dL (ref 8–23)
CO2: 28 mmol/L (ref 22–32)
Calcium: 9.4 mg/dL (ref 8.9–10.3)
Chloride: 99 mmol/L (ref 98–111)
Creatinine: 0.83 mg/dL (ref 0.61–1.24)
GFR, Estimated: >60 mL/min (ref >60)
Glucose, Bld: 105 mg/dL — ABNORMAL HIGH (ref 70–99)
Potassium: 4.5 mmol/L (ref 3.5–5.1)
Sodium: 132 mmol/L — ABNORMAL LOW (ref 135–145)
Total Bilirubin: 0.9 mg/dL (ref 0.3–1.2)
Total Protein: 7.1 g/dL (ref 6.5–8.1)

## 2022-05-30 LAB — TOTAL PROTEIN, URINE DIPSTICK: Protein, ur: NEGATIVE mg/dL

## 2022-05-30 MED ORDER — LOMUSTINE 10 MG PO CAPS: 20.0000 mg | ORAL_CAPSULE | Freq: Once | ORAL | 0 refills | Status: AC

## 2022-05-30 MED ORDER — SODIUM CHLORIDE 0.9 % IV SOLN
10.0000 mg/kg | Freq: Once | INTRAVENOUS | Status: AC
  Administered 2022-05-30: 700 mg via INTRAVENOUS
  Filled 2022-05-30: qty 16

## 2022-05-30 MED ORDER — SODIUM CHLORIDE 0.9 % IV SOLN: Freq: Once | INTRAVENOUS | Status: AC

## 2022-05-30 MED ORDER — LOMUSTINE 40 MG PO CAPS: 40.0000 mg | ORAL_CAPSULE | Freq: Once | ORAL | 0 refills | Status: AC

## 2022-05-30 MED ORDER — LOMUSTINE 100 MG PO CAPS: 100.0000 mg | ORAL_CAPSULE | Freq: Once | ORAL | 0 refills | Status: AC

## 2022-05-30 NOTE — Progress Notes (Signed)
[facility] at [place], [place] [address] [phone]   Interval Evaluation  Date of Service: 05/30/22 [name] Patient MRN: [id number] Patient DOB: 1961-05-11 Provider: Ventura Sellers, MD  Identifying Statement:  Hunter Terry is a 61 y.o. male with left temporal glioblastoma   Oncologic History: Oncology History  Glioblastoma, IDH-wildtype ([place])  06/15/2020 Surgery   Stereotactic biopsy, L temporal [name]).  Path demonstrates glioblastoma IDH-1 wt   08/02/2020 Surgery   Debulking craniotomy with Dr. [name]   09/05/2020 - 10/17/2020 Radiation Therapy   IMRT with concurrent Temozolomide [name])   11/19/2020 -  Chemotherapy   Initiates cycle #1 adjuvant Temozolomide       07/17/2021 - 04/01/2022 Chemotherapy   Patient is on Treatment Plan : BRAIN Lomustine q42d     07/26/2021 -  Chemotherapy   Patient is on Treatment Plan : BRAIN GBM Bevacizumab 14d x 6 cycles     05/16/2022 -  Chemotherapy   Patient is on Treatment Plan : BRAIN Lomustine q42d       Biomarkers:  MGMT Unknown.  IDH 1/2 Wild type.  EGFR Unknown  TERT "Mutated   Interval History:  Anthonymichael Terry presents today for follow up for avastin infusion.  No clinical changes today, continues to have a dry cough at times.  No balance issues, motor dysfunction.  Continues on Keppra $RemoveB'750mg'EilbSDqF$  BID, no further seizures.  H+P (07/24/20) Patient presented to medical attention in late October, 2021 with new onset seizure.  Event was decribed as loss of consciousness, sudden, without clarity on further details aside from altered mental status upon awakening.  CNS imaging demonsrated non-enhancing mass within left temporal lobe c/w likely glioma; he underwent stereotactic biopsy with Dr. [name] on 06/15/20.  There was significant delay in finalizing path, explaining delay in follow up and evaluation.  He denies any seizures since discharge from hospital, on  4 anti-seizure drugs.  He does complain of dizziness and drowsiness with the medications, however.  Also describes impaired short term memory.  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    Medications: Current Outpatient Medications on File Prior to Visit  Medication Sig Dispense Refill   levETIRAcetam (KEPPRA) 750 MG tablet Take 1 tablet (750 mg total) by mouth 2 (two) times daily. 60 tablet 3   [DISCONTINUED] lacosamide (VIMPAT) 200 MG TABS tablet Take 0.5 tablets (100 mg total) by mouth 2 (two) times daily. 30 tablet 0   No current facility-administered medications on file prior to visit.    Allergies: No Known Allergies Past Medical History:  Past Medical History:  Diagnosis Date   Cancer ([place])    BRAIN TUMOR   Headache    Seizure [facility])    Past Surgical History:  Past Surgical History:  Procedure Laterality Date   APPLICATION OF CRANIAL NAVIGATION N/A 06/15/2020   Procedure: APPLICATION OF CRANIAL NAVIGATION;  Surgeon: [name], MD;  Location: [place];  Service: Neurosurgery;  Laterality: N/A;   APPLICATION OF CRANIAL NAVIGATION N/A 08/02/2020   Procedure: APPLICATION OF CRANIAL NAVIGATION;  Surgeon: [name], MD;  Location: [place];  Service: Neurosurgery;  Laterality: N/A;   CRANIOTOMY N/A 08/02/2020   Procedure: CRANIOTOMY LEFT TEMPORAL LOBECTOMY FOR TUMOR EXCISION;  Surgeon: [name], MD;  Location: [place];  Service: Neurosurgery;  Laterality: N/A;   FRAMELESS  BIOPSY WITH BRAINLAB Left 06/15/2020   Procedure: Left temporal lobe stereotactic brain biopsy with brainlab;  Surgeon: [name], MD;  Location: [place];  Service: Neurosurgery;  Laterality: Left;   Social History:  Social History   Socioeconomic History   Marital status: Married    Spouse name: Not on file   Number of children: Not on file   Years of education: Not on file   Highest education level: Not on file  Occupational History   Not on file  Tobacco Use    Smoking status: Never   Smokeless tobacco: Never  Substance and Sexual Activity   Alcohol use: Never   Drug use: Not on file   Sexual activity: Yes    Partners: Female  Other Topics Concern   Not on file  Social History Narrative   Not on file   Social Determinants of Health   Financial Resource Strain: Not on file  Food Insecurity: Not on file  Transportation Needs: Not on file  Physical Activity: Not on file  Stress: Not on file  Social Connections: Not on file  Intimate Partner Violence: Not on file   Family History:  Family History  Problem Relation Age of Onset   Cancer Neg Hx     Review of Systems: Constitutional: Doesn't report fevers, chills or abnormal weight loss Eyes: Doesn't report blurriness of vision Ears, nose, mouth, throat, and face: Doesn't report sore throat Respiratory: Doesn't report cough, dyspnea or wheezes Cardiovascular: Doesn't report palpitation, chest discomfort  Gastrointestinal:  Doesn't report nausea, constipation, diarrhea GU: Doesn't report incontinence Skin: Doesn't report skin rashes Neurological: Per HPI Musculoskeletal: Doesn't report joint pain Behavioral/Psych: Doesn't report anxiety  Physical Exam: Vitals:   05/30/22 1152  BP: 131/86  Pulse: 71  Resp: 15  SpO2: 99%     KPS: 90. General: Alert, cooperative, pleasant, in no acute distress Head: Normal EENT: No conjunctival injection or scleral icterus.  Lungs: Resp effort normal Cardiac: Regular rate Abdomen: Non-distended abdomen Skin: No rashes cyanosis or petechiae. Extremities: No clubbing or edema  Neurologic Exam: Mental Status: Awake, alert, attentive to examiner. Oriented to self and environment. Language is fluent with intact comprehension.  Cranial Nerves: Visual acuity is grossly normal. Visual fields are full. Extra-ocular movements intact. No ptosis. Face is symmetric Motor: Tone and bulk are normal. Power is full in both arms and legs. Reflexes are  symmetric, no pathologic reflexes present.  Sensory: Intact to light touch Gait: Normal.   Labs: I have reviewed the data as listed    Component Value Date/Time   NA 132 (L) 05/30/2022 1128   K 4.5 05/30/2022 1128   CL 99 05/30/2022 1128   CO2 28 05/30/2022 1128   GLUCOSE 105 (H) 05/30/2022 1128   BUN 9 05/30/2022 1128   CREATININE 0.83 05/30/2022 1128   CALCIUM 9.4 05/30/2022 1128   PROT 7.1 05/30/2022 1128   ALBUMIN 4.4 05/30/2022 1128   AST 74 (H) 05/30/2022 1128   ALT 102 (H) 05/30/2022 1128   ALKPHOS 208 (H) 05/30/2022 1128   BILITOT 0.9 05/30/2022 1128   GFRNONAA >60 05/30/2022 1128   Lab Results  Component Value Date   WBC 4.7 05/30/2022   NEUTROABS 3.2 05/30/2022   HGB 14.9 05/30/2022   HCT 41.1 05/30/2022   MCV 88.4 05/30/2022   PLT 179 05/30/2022    Imaging:  [place] Clinician Interpretation: I have personally reviewed the CNS images as listed.  My interpretation, in the context of the patient's clinical presentation, is treatment effect vs true progression  MR BRAIN W WO CONTRAST  Result Date:  05/10/2022 CLINICAL DATA:  61 year old male with ID H wild type glioblastoma. Stable MRI appearance of disease since January. Restaging. EXAM: MRI HEAD WITHOUT AND WITH CONTRAST TECHNIQUE: Multiplanar, multiecho pulse sequences of the brain and surrounding structures were obtained without and with intravenous contrast. CONTRAST:  7 mL [place] COMPARISON:  Brain MRI 02/02/2022 and earlier. FINDINGS: Brain: Postoperative changes to the left temporal lobe. Regional T2 and FLAIR heterogeneity and hyperintensity is stable since March. No new or increased mass effect since that time. On axial postcontrast, faint nodular enhancement along the posterior resection margin has not significantly changed (such as on series 16, image 64). No suspicious DWI changes. However, on both coronal and sagittal postcontrast images there is subtle increased solid and nodular enhancement along the  superior resection margin, such as series 17, image 17. No regional mass effect. Mild ex vacuo enlargement of the left lateral ventricle is stable. No new signal changes identified. No restricted diffusion suggestive of acute infarction. No ventriculomegaly, extra-axial collection or acute intracranial hemorrhage. Cervicomedullary junction and pituitary are within normal limits. Vascular: Major intracranial vascular flow voids are stable. Major dural venous sinuses are enhancing and appear to be patent. Skull and upper cervical spine: Previous left craniotomy. Visualized bone marrow signal is within normal limits. Negative visible cervical spine and spinal cord. Sinuses/Orbits: Stable, negative. Other: Mastoids remain clear. Visible internal auditory structures appear normal. Stable visible scalp soft tissues. IMPRESSION: 1. Subtle increased enhancement along the superior resection margin since March, only apparent on sagittal and coronal postcontrast T1. And otherwise stable post treatment appearance of the left hemisphere. Recommend continued MRI surveillance. 2. No new intracranial abnormality. Electronically Signed   By: Genevie Ann M.D.   On: 05/10/2022 11:50     Assessment/Plan Glioblastoma, IDH-wildtype ([place]) [C71.9]  Jester Skillin is clinically stable today, now having completed cycle #7 of CCNU/Avastin.  No clinical changes today.  We recommended continuation of second line therapy with cycle #8 CCNU $Remov'90mg'sppHpn$ /m2 PO q6 weeks and Avastin $RemoveBef'10mg'sMKgAeaXwS$ /kg IV, now q3 weeks.    Chemotherapy should be held for the following:  ANC less than 1,000  Platelets less than 100,000  LFT or creatinine greater than 2x ULN  If clinical concerns/contraindications develop  Avastin should be held for the following:  ANC less than 500  Platelets less than 50,000  LFT or creatinine greater than 2x ULN  If clinical concerns/contraindications develop  Keppra will stay at $Remov'750mg'ARIRrh$  BID.  We ask that Erico Stan  return to clinic in 3 weeks for cycle #8 CCNU and avastin.  MRI will follow cycle 8.  We appreciate the opportunity to participate in the care of [place].    All questions were answered. The patient knows to call the clinic with any problems, questions or concerns. No barriers to learning were detected.  The total time spent in the encounter was 30 minutes and more than 50% was on counseling and review of test results   Ventura Sellers, MD Medical Director of Neuro-Oncology [facility] at [place] 05/30/22 12:36 PM

## 2022-05-31 ENCOUNTER — Other Ambulatory Visit (HOSPITAL_COMMUNITY): Payer: Self-pay

## 2022-05-31 ENCOUNTER — Telehealth: Payer: Self-pay | Admitting: Pharmacy Technician

## 2022-05-31 ENCOUNTER — Other Ambulatory Visit: Payer: Self-pay

## 2022-05-31 NOTE — Telephone Encounter (Addendum)
Oral Oncology Patient Advocate Encounter  Reached out and spoke with patient regarding PAP paperwork, explained that I would send it to their preferred email via Docu[employer].   Confirmed email address: [email or web address].    Patient expressed understanding and consent.  [name], CPhT-Adv Oncology Pharmacy Patient [place] Direct Number: [phone]  Fax: [phone]

## 2022-05-31 NOTE — Telephone Encounter (Addendum)
Oral Oncology Patient Advocate Encounter   Received notification that patient is due for re-enrollment for assistance for Lomustine through [employer].   Re-enrollment process has been initiated and will be submitted upon completion of necessary documents.  Patient requested documents be sent to him via email for signature.  Email confirmed: [email or web address]  [employer] phone number [phone].   I will continue to follow until final determination.  Interaction was completed with assistance from interpreter Debroah Baller ([place] [id number])  [name], [place] Patient [place] Direct Number: [phone]  Fax: [phone]

## 2022-06-01 ENCOUNTER — Other Ambulatory Visit: Payer: Self-pay

## 2022-06-04 NOTE — Telephone Encounter (Signed)
Oral Oncology Patient Advocate Encounter   Submitted application for assistance for Lomustine to [employer].   Application submitted via e-fax to [phone]   [employer] phone number [phone].   I will continue to check the status until final determination.   [name], CPhT-Adv Oncology Pharmacy Patient [place] Direct Number: [phone]  Fax: [phone]

## 2022-06-06 ENCOUNTER — Other Ambulatory Visit: Payer: Self-pay | Admitting: Internal Medicine

## 2022-06-06 ENCOUNTER — Telehealth: Payer: Self-pay | Admitting: *Deleted

## 2022-06-06 DIAGNOSIS — C719 Malignant neoplasm of brain, unspecified: Secondary | ICD-10-CM

## 2022-06-06 MED ORDER — LOMUSTINE 10 MG PO CAPS: 20.0000 mg | ORAL_CAPSULE | Freq: Once | ORAL | 0 refills | Status: AC

## 2022-06-06 MED ORDER — LOMUSTINE 100 MG PO CAPS: 100.0000 mg | ORAL_CAPSULE | Freq: Once | ORAL | 0 refills | Status: AC

## 2022-06-06 MED ORDER — LOMUSTINE 40 MG PO CAPS: 40.0000 mg | ORAL_CAPSULE | Freq: Once | ORAL | 0 refills | Status: AC

## 2022-06-06 NOTE — Telephone Encounter (Signed)
Patient has not received medication CCNU as of yet.  Reached out to [place] with oral chemo and was advised that patient's drug assistance with the program  used before is expired.  They needed new hard copy Rx printed and faxed to [facility] [place] [phone] to help process his application.  Patient usually has this medication shipped to [facility] and we give the medication tot he patient at that time.    Interpreter Almyra Free assisted with communicating this to the patient.

## 2022-06-06 NOTE — Telephone Encounter (Signed)
Oral Oncology Patient Advocate Encounter   Received notification that the application for assistance for Lomustine through [employer] has been approved.   Effective for 6 additional cycles of treatment.  I have spoken to the patient.  [name], CPhT-Adv Oncology Pharmacy Patient [place] Direct Number: [phone]  Fax: [phone]

## 2022-06-07 ENCOUNTER — Telehealth: Payer: Self-pay | Admitting: Pharmacy Technician

## 2022-06-07 NOTE — Telephone Encounter (Signed)
Oral Oncology Patient Advocate Encounter  Called [employer] [phone] to set up shipment of Lomustine for patient.   Shipment is set to be shipped on 10/30 and the estimated arrival to the office is 10/31.  I will call patient to coordinate pick-up after medication receipt is confirmed at the office.  [name], CPhT-Adv Oncology Pharmacy Patient [place] Direct Number: [phone]  Fax: [phone]

## 2022-06-14 ENCOUNTER — Encounter: Payer: Self-pay | Admitting: Pharmacy Technician

## 2022-06-14 ENCOUNTER — Telehealth: Payer: Self-pay | Admitting: Pharmacist

## 2022-06-14 NOTE — Progress Notes (Deleted)
Created in error

## 2022-06-14 NOTE — Progress Notes (Signed)
Erroneous encounter

## 2022-06-14 NOTE — Telephone Encounter (Signed)
Oral Chemotherapy Pharmacist Encounter   Patient picked up 7th cycle of Gleostine (lomustine) on 06/13/22 from clinic. (He recently was re-enrolled on 10/30 for approval of an additional 6 cycles of Gleostine at no charge through [employer])  Confirmed with patient that he dosed 7th cycle 06/13/22 PM.   Patient knows to call the office with questions or concerns.  Leron Croak, PharmD, BCPS, [facility] Hematology/Oncology Clinical Pharmacist [place] and [place] [phone] 06/14/2022 10:39 AM

## 2022-06-18 ENCOUNTER — Other Ambulatory Visit: Payer: Self-pay

## 2022-06-20 ENCOUNTER — Inpatient Hospital Stay (HOSPITAL_BASED_OUTPATIENT_CLINIC_OR_DEPARTMENT_OTHER): Payer: Medicaid Other | Admitting: Internal Medicine

## 2022-06-20 ENCOUNTER — Inpatient Hospital Stay: Payer: Medicaid Other

## 2022-06-20 ENCOUNTER — Other Ambulatory Visit: Payer: Self-pay

## 2022-06-20 ENCOUNTER — Inpatient Hospital Stay: Payer: Medicaid Other | Attending: Internal Medicine

## 2022-06-20 VITALS — BP 121/83 | HR 66 | Temp 98.1°F | Resp 15 | Wt 158.0 lb

## 2022-06-20 DIAGNOSIS — Z9221 Personal history of antineoplastic chemotherapy: Secondary | ICD-10-CM | POA: Diagnosis not present

## 2022-06-20 DIAGNOSIS — M47812 Spondylosis without myelopathy or radiculopathy, cervical region: Secondary | ICD-10-CM | POA: Insufficient documentation

## 2022-06-20 DIAGNOSIS — Z79899 Other long term (current) drug therapy: Secondary | ICD-10-CM | POA: Diagnosis not present

## 2022-06-20 DIAGNOSIS — R748 Abnormal levels of other serum enzymes: Secondary | ICD-10-CM | POA: Diagnosis not present

## 2022-06-20 DIAGNOSIS — R42 Dizziness and giddiness: Secondary | ICD-10-CM | POA: Diagnosis not present

## 2022-06-20 DIAGNOSIS — Z923 Personal history of irradiation: Secondary | ICD-10-CM | POA: Insufficient documentation

## 2022-06-20 DIAGNOSIS — R400 Somnolence: Secondary | ICD-10-CM | POA: Insufficient documentation

## 2022-06-20 DIAGNOSIS — C712 Malignant neoplasm of temporal lobe: Secondary | ICD-10-CM | POA: Insufficient documentation

## 2022-06-20 DIAGNOSIS — C719 Malignant neoplasm of brain, unspecified: Secondary | ICD-10-CM | POA: Diagnosis not present

## 2022-06-20 DIAGNOSIS — R569 Unspecified convulsions: Secondary | ICD-10-CM | POA: Diagnosis not present

## 2022-06-20 DIAGNOSIS — Z5112 Encounter for antineoplastic immunotherapy: Secondary | ICD-10-CM | POA: Diagnosis present

## 2022-06-20 LAB — CBC WITH DIFFERENTIAL (CANCER CENTER ONLY)
Abs Immature Granulocytes: 0 10*3/uL (ref 0.00–0.07)
Basophils Absolute: 0 10*3/uL (ref 0.0–0.1)
Basophils Relative: 0 %
Eosinophils Absolute: 0.1 10*3/uL (ref 0.0–0.5)
Eosinophils Relative: 3 %
HCT: 41 % (ref 39.0–52.0)
Hemoglobin: 14.6 g/dL (ref 13.0–17.0)
Immature Granulocytes: 0 %
Lymphocytes Relative: 22 %
Lymphs Abs: 0.6 10*3/uL — ABNORMAL LOW (ref 0.7–4.0)
MCH: 31.7 pg (ref 26.0–34.0)
MCHC: 35.6 g/dL (ref 30.0–36.0)
MCV: 88.9 fL (ref 80.0–100.0)
Monocytes Absolute: 0.4 10*3/uL (ref 0.1–1.0)
Monocytes Relative: 14 %
Neutro Abs: 1.6 10*3/uL — ABNORMAL LOW (ref 1.7–7.7)
Neutrophils Relative %: 61 %
Platelet Count: 172 10*3/uL (ref 150–400)
RBC: 4.61 MIL/uL (ref 4.22–5.81)
RDW: 12.7 % (ref 11.5–15.5)
WBC Count: 2.6 10*3/uL — ABNORMAL LOW (ref 4.0–10.5)
nRBC: 0 % (ref 0.0–0.2)

## 2022-06-20 LAB — CMP (CANCER CENTER ONLY)
ALT: 231 U/L — ABNORMAL HIGH (ref 0–44)
AST: 114 U/L — ABNORMAL HIGH (ref 15–41)
Albumin: 4.4 g/dL (ref 3.5–5.0)
Alkaline Phosphatase: 235 U/L — ABNORMAL HIGH (ref 38–126)
Anion gap: 6 (ref 5–15)
BUN: 11 mg/dL (ref 8–23)
CO2: 28 mmol/L (ref 22–32)
Calcium: 9.5 mg/dL (ref 8.9–10.3)
Chloride: 102 mmol/L (ref 98–111)
Creatinine: 0.8 mg/dL (ref 0.61–1.24)
GFR, Estimated: >60 mL/min (ref >60)
Glucose, Bld: 100 mg/dL — ABNORMAL HIGH (ref 70–99)
Potassium: 4.4 mmol/L (ref 3.5–5.1)
Sodium: 136 mmol/L (ref 135–145)
Total Bilirubin: 1 mg/dL (ref 0.3–1.2)
Total Protein: 7.2 g/dL (ref 6.5–8.1)

## 2022-06-20 LAB — TOTAL PROTEIN, URINE DIPSTICK: Protein, ur: NEGATIVE mg/dL

## 2022-06-20 MED ORDER — SODIUM CHLORIDE 0.9 % IV SOLN: Freq: Once | INTRAVENOUS | Status: AC

## 2022-06-20 MED ORDER — SODIUM CHLORIDE 0.9 % IV SOLN
10.0000 mg/kg | Freq: Once | INTRAVENOUS | Status: AC
  Administered 2022-06-20: 700 mg via INTRAVENOUS
  Filled 2022-06-20: qty 16

## 2022-06-20 NOTE — Progress Notes (Signed)
[facility] at [place], [place] [address] [phone]   Interval Evaluation  Date of Service: 06/20/22 [name] Patient MRN: [id number] Patient DOB: [birth year] Provider: Ventura Sellers, MD  Identifying Statement:  Hunter Terry is a 61 y.o. male with left temporal glioblastoma   Oncologic History: Oncology History  Glioblastoma, IDH-wildtype ([place])  06/15/2020 Surgery   Stereotactic biopsy, L temporal Hunter Terry).  Path demonstrates glioblastoma IDH-1 wt   08/02/2020 Surgery   Debulking craniotomy with Dr. Marcello Terry   09/05/2020 - 10/17/2020 Radiation Therapy   IMRT with concurrent Temozolomide Hunter Terry)   11/19/2020 -  Chemotherapy   Initiates cycle #1 adjuvant Temozolomide       07/17/2021 - 04/01/2022 Chemotherapy   Patient is on Treatment Plan : BRAIN Lomustine q42d     07/26/2021 -  Chemotherapy   Patient is on Treatment Plan : BRAIN GBM Bevacizumab 14d x 6 cycles     05/16/2022 -  Chemotherapy   Patient is on Treatment Plan : BRAIN Lomustine q42d       Biomarkers:  MGMT Unknown.  IDH 1/2 Wild type.  EGFR Unknown  TERT "Mutated   Interval History:  Hunter Terry presents today for follow up for avastin infusion.  He dosed CCNU pills on 11/2 without issue, aside from some cramping.  No clinical changes today otherwise. No balance issues, motor dysfunction.  Continues on Keppra 771m BID, no further seizures.  H+P (07/24/20) Patient presented to medical attention in late October, 2021 with new onset seizure.  Event was decribed as loss of consciousness, sudden, without clarity on further details aside from altered mental status upon awakening.  CNS imaging demonsrated non-enhancing mass within left temporal lobe c/w likely glioma; he underwent stereotactic biopsy with Dr. TMarcello Mooreson 06/15/20.  There was significant delay in finalizing path, explaining delay in follow up and evaluation.  He denies any  seizures since discharge from hospital, on 4 anti-seizure drugs.  He does complain of dizziness and drowsiness with the medications, however.  Also describes impaired short term memory.  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    Medications: Current Outpatient Medications on File Prior to Visit  Medication Sig Dispense Refill   levETIRAcetam (KEPPRA) 750 MG tablet Take 1 tablet (750 mg total) by mouth 2 (two) times daily. 60 tablet 3   [DISCONTINUED] lacosamide (VIMPAT) 200 MG TABS tablet Take 0.5 tablets (100 mg total) by mouth 2 (two) times daily. 30 tablet 0   No current facility-administered medications on file prior to visit.    Allergies: No Known Allergies Past Medical History:  Past Medical History:  Diagnosis Date   Cancer ([place]    BRAIN TUMOR   Headache    Seizure ([facility]    Past Surgical History:  Past Surgical History:  Procedure Laterality Date   APPLICATION OF CRANIAL NAVIGATION N/A 06/15/2020   Procedure: APPLICATION OF CRANIAL NAVIGATION;  Surgeon: TVallarie Mare MD;  Location: [place]  Service: Neurosurgery;  Laterality: N/A;   APPLICATION OF CRANIAL NAVIGATION N/A 08/02/2020   Procedure: APPLICATION OF CRANIAL NAVIGATION;  Surgeon: TVallarie Mare MD;  [place]  Service: Neurosurgery;  Laterality: N/A;   CRANIOTOMY N/A 08/02/2020   Procedure: CRANIOTOMY LEFT TEMPORAL LOBECTOMY FOR TUMOR EXCISION;  Surgeon: TVallarie Mare MD;  [place]  Service: Neurosurgery;  Laterality: N/A;   FRAMELESS  BIOPSY WITH BRAINLAB Left 06/15/2020   Procedure: Left temporal lobe  stereotactic brain biopsy with brainlab;  Surgeon: [name], MD;  Location: [place];  Service: Neurosurgery;  Laterality: Left;   Social History:  Social History   Socioeconomic History   Marital status: Married    Spouse name: Not on file   Number of children: Not on file   Years of education: Not on file   Highest education level: Not on file  Occupational  History   Not on file  Tobacco Use   Smoking status: Never   Smokeless tobacco: Never  Substance and Sexual Activity   Alcohol use: Never   Drug use: Not on file   Sexual activity: Yes    Partners: Female  Other Topics Concern   Not on file  Social History Narrative   Not on file   Social Determinants of Health   Financial Resource Strain: Not on file  Food Insecurity: Not on file  Transportation Needs: Not on file  Physical Activity: Not on file  Stress: Not on file  Social Connections: Not on file  Intimate Partner Violence: Not on file   Family History:  Family History  Problem Relation Age of Onset   Cancer Neg Hx     Review of Systems: Constitutional: Doesn't report fevers, chills or abnormal weight loss Eyes: Doesn't report blurriness of vision Ears, nose, mouth, throat, and face: Doesn't report sore throat Respiratory: Doesn't report cough, dyspnea or wheezes Cardiovascular: Doesn't report palpitation, chest discomfort  Gastrointestinal:  Doesn't report nausea, constipation, diarrhea GU: Doesn't report incontinence Skin: Doesn't report skin rashes Neurological: Per HPI Musculoskeletal: Doesn't report joint pain Behavioral/Psych: Doesn't report anxiety  Physical Exam: Vitals:   06/20/22 1005  BP: 121/83  Pulse: 66  Resp: 15  Temp: 98.1 F (36.7 C)  SpO2: 99%   KPS: 90. General: Alert, cooperative, pleasant, in no acute distress Head: Normal EENT: No conjunctival injection or scleral icterus.  Lungs: Resp effort normal Cardiac: Regular rate Abdomen: Non-distended abdomen Skin: No rashes cyanosis or petechiae. Extremities: No clubbing or edema  Neurologic Exam: Mental Status: Awake, alert, attentive to examiner. Oriented to self and environment. Language is fluent with intact comprehension.  Cranial Nerves: Visual acuity is grossly normal. Visual fields are full. Extra-ocular movements intact. No ptosis. Face is symmetric Motor: Tone and bulk are  normal. Power is full in both arms and legs. Reflexes are symmetric, no pathologic reflexes present.  Sensory: Intact to light touch Gait: Normal.   Labs: I have reviewed the data as listed    Component Value Date/Time   NA 132 (L) 05/30/2022 1128   K 4.5 05/30/2022 1128   CL 99 05/30/2022 1128   CO2 28 05/30/2022 1128   GLUCOSE 105 (H) 05/30/2022 1128   BUN 9 05/30/2022 1128   CREATININE 0.83 05/30/2022 1128   CALCIUM 9.4 05/30/2022 1128   PROT 7.1 05/30/2022 1128   ALBUMIN 4.4 05/30/2022 1128   AST 74 (H) 05/30/2022 1128   ALT 102 (H) 05/30/2022 1128   ALKPHOS 208 (H) 05/30/2022 1128   BILITOT 0.9 05/30/2022 1128   GFRNONAA >60 05/30/2022 1128   Lab Results  Component Value Date   WBC 2.6 (L) 06/20/2022   NEUTROABS 1.6 (L) 06/20/2022   HGB 14.6 06/20/2022   HCT 41.0 06/20/2022   MCV 88.9 06/20/2022   PLT 172 06/20/2022     Assessment/Plan Glioblastoma, IDH-wildtype ([place]) [C71.9]  Jibril Hoadley is clinically stable today, now day 8/42 cycle #8 of CCNU/Avastin.  No new or progressive deficits. Elevated liver  enzymes are secondary to chemotherapy.  We recommended continuation of second line therapy with cycle #8 CCNU 62m/m2 PO q6 weeks and Avastin 136mkg IV, now q3 weeks.    Chemotherapy should be held for the following:  ANC less than 1,000  Platelets less than 100,000  LFT or creatinine greater than 2x ULN  If clinical concerns/contraindications develop  Avastin should be held for the following:  ANC less than 500  Platelets less than 50,000  LFT or creatinine greater than 2x ULN  If clinical concerns/contraindications develop  Keppra will stay at 75030mID.  We ask that EucJohnnell Liouturn to clinic in 3 weeks for cycle #8 CCNU and avastin.  MRI will follow cycle 8.  We appreciate the opportunity to participate in the care of Euc[place]  All questions were answered. The patient knows to call the clinic with any problems, questions or  concerns. No barriers to learning were detected.  The total time spent in the encounter was 30 minutes and more than 50% was on counseling and review of test results   ZacVentura SellersD Medical Director of Neuro-Oncology [facility]/09/23 10:01 AM

## 2022-06-20 NOTE — Patient Instructions (Signed)
Instrucciones al darle de alta: Discharge Instructions Gracias por elegir al [facility] de Cncer de [place] para brindarle atencin mdica de oncologa y Music therapist.   Si usted tiene una cita de laboratorio con [place], por favor vaya directamente [place] y regstrese en el rea de Control and instrumentation engineer.   Use ropa cmoda y [place] para tener fcil acceso a las vas del Portacath (acceso venoso de Engineer, site duracin) o la lnea PICC (catter central colocado por va perifrica).   Nos esforzamos por ofrecerle tiempo de calidad con su proveedor. Es posible que tenga que volver a programar su cita si llega tarde (15 minutos o ms).  El llegar tarde le afecta a usted y a otros pacientes cuyas citas son posteriores a Merchandiser, retail.  Adems, si usted falta a tres o ms citas sin avisar a la oficina, puede ser retirado(a) de la clnica a discrecin del proveedor.      Para las solicitudes de renovacin de recetas, pida a su farmacia que se ponga en contacto con nuestra oficina y deje que transcurran 37 horas para que se complete el proceso de las renovaciones.    Hoy usted recibi los siguientes agentes de quimioterapia e/o inmunoterapia: Gratis.       Para ayudar a prevenir las nuseas y los vmitos despus de su tratamiento, le recomendamos que tome su medicamento para las nuseas segn las indicaciones.  LOS SNTOMAS QUE DEBEN COMUNICARSE INMEDIATAMENTE SE INDICAN A CONTINUACIN: *FIEBRE SUPERIOR A 100.4 F (38 C) O MS *ESCALOFROS O SUDORACIN *NUSEAS Y VMITOS QUE NO SE CONTROLAN CON EL MEDICAMENTO PARA LAS NUSEAS *DIFICULTAD INUSUAL PARA RESPIRAR  *MORETONES O HEMORRAGIAS NO HABITUALES *PROBLEMAS URINARIOS (dolor o ardor al Garment/textile technologist o frecuencia para Garment/textile technologist) *PROBLEMAS INTESTINALES (diarrea inusual, estreimiento, dolor cerca del ano) SENSIBILIDAD EN LA BOCA Y EN LA GARGANTA CON O SIN LA PRESENCIA DE LCERAS (dolor de garganta, llagas en la boca o dolor de muelas/dientes) ERUPCIN,  HINCHAZN O DOLORES INUSUALES FLUJO VAGINAL INUSUAL O PICAZN/RASQUIA    Los puntos marcados con un asterisco ( *) indican una posible emergencia y debe hacer un seguimiento tan pronto como le sea posible o vaya al Departamento de Emergencias si se le presenta algn problema.  Por favor, muestre la [place] DE ADVERTENCIA DE Windy Canny DE ADVERTENCIA DE Benay Spice al registrarse en [address] de Emergencias y a la enfermera de triaje.  Si tiene preguntas despus de su visita o necesita cancelar o volver a programar su cita, por favor pngase en contacto con [facility]  Dept: [phone] y [place]. Las horas de oficina son de 8:00 a.m. a 4:30 p.m. de lunes a viernes. Por favor, tenga en cuenta que los mensajes de voz que se dejan despus de las 4:00 p.m. posiblemente no se devolvern hasta el siguiente da de [place].  Cerramos los fines de semana y [employer]. En todo momento tiene acceso a una enfermera para preguntas urgentes. Por favor, llame al nmero principal de la clnica Dept: [phone] y [place] instrucciones.   Para cualquier pregunta que no sea de carcter urgente, tambin puede ponerse en contacto con su proveedor [employer] Ahora ofrecemos visitas electrnicas para cualquier persona mayor de 18 aos que solicite atencin mdica en lnea para los sntomas que no sean urgentes. Para ms detalles vaya a [email or web address].   Tambin puede bajar la aplicacin de MyChart! Vaya a la tienda de aplicaciones, busque "MyChart", abra la aplicacin,  seleccione [place], e ingrese con su nombre de usuario y la contrasea de Pharmacist, community.  Las mscaras son opcionales en los centros de Hotel manager. Si desea que su equipo de cuidados mdicos use una [employer] atienden, por favor hgaselo saber al personal. Bethann Berkshire una persona de apoyo que tenga por lo menos 16 aos para que le acompae a sus  citas.

## 2022-06-21 ENCOUNTER — Other Ambulatory Visit: Payer: Self-pay

## 2022-06-25 ENCOUNTER — Other Ambulatory Visit: Payer: Self-pay | Admitting: Radiation Therapy

## 2022-06-26 ENCOUNTER — Other Ambulatory Visit: Payer: Self-pay

## 2022-07-05 ENCOUNTER — Ambulatory Visit (HOSPITAL_COMMUNITY)
Admission: RE | Admit: 2022-07-05 | Discharge: 2022-07-05 | Disposition: A | Discharge status: Home / Self Care | Payer: Medicaid Other | Source: Ambulatory Visit | Attending: Internal Medicine | Admitting: Internal Medicine

## 2022-07-05 DIAGNOSIS — C719 Malignant neoplasm of brain, unspecified: Secondary | ICD-10-CM | POA: Diagnosis present

## 2022-07-05 MED ORDER — GADOBUTROL 1 MMOL/ML IV SOLN
7.0000 mL | Freq: Once | INTRAVENOUS | Status: AC | PRN
  Administered 2022-07-05: 7 mL via INTRAVENOUS

## 2022-07-11 ENCOUNTER — Inpatient Hospital Stay: Payer: Medicaid Other

## 2022-07-11 ENCOUNTER — Inpatient Hospital Stay (HOSPITAL_BASED_OUTPATIENT_CLINIC_OR_DEPARTMENT_OTHER): Payer: Medicaid Other | Admitting: Internal Medicine

## 2022-07-11 ENCOUNTER — Other Ambulatory Visit: Payer: Self-pay

## 2022-07-11 ENCOUNTER — Other Ambulatory Visit: Payer: Self-pay | Admitting: *Deleted

## 2022-07-11 VITALS — BP 130/71 | HR 70 | Temp 97.9°F | Resp 14 | Wt 155.7 lb

## 2022-07-11 DIAGNOSIS — C719 Malignant neoplasm of brain, unspecified: Secondary | ICD-10-CM

## 2022-07-11 DIAGNOSIS — R569 Unspecified convulsions: Secondary | ICD-10-CM | POA: Diagnosis not present

## 2022-07-11 DIAGNOSIS — Z5112 Encounter for antineoplastic immunotherapy: Secondary | ICD-10-CM | POA: Diagnosis not present

## 2022-07-11 LAB — CBC WITH DIFFERENTIAL (CANCER CENTER ONLY)
Abs Immature Granulocytes: 0.01 10*3/uL (ref 0.00–0.07)
Basophils Absolute: 0 10*3/uL (ref 0.0–0.1)
Basophils Relative: 0 %
Eosinophils Absolute: 0 10*3/uL (ref 0.0–0.5)
Eosinophils Relative: 1 %
HCT: 42.8 % (ref 39.0–52.0)
Hemoglobin: 14.9 g/dL (ref 13.0–17.0)
Immature Granulocytes: 0 %
Lymphocytes Relative: 24 %
Lymphs Abs: 0.7 10*3/uL (ref 0.7–4.0)
MCH: 31.5 pg (ref 26.0–34.0)
MCHC: 34.8 g/dL (ref 30.0–36.0)
MCV: 90.5 fL (ref 80.0–100.0)
Monocytes Absolute: 0.5 10*3/uL (ref 0.1–1.0)
Monocytes Relative: 18 %
Neutro Abs: 1.6 10*3/uL — ABNORMAL LOW (ref 1.7–7.7)
Neutrophils Relative %: 57 %
Platelet Count: 126 10*3/uL — ABNORMAL LOW (ref 150–400)
RBC: 4.73 MIL/uL (ref 4.22–5.81)
RDW: 13.2 % (ref 11.5–15.5)
WBC Count: 2.9 10*3/uL — ABNORMAL LOW (ref 4.0–10.5)
nRBC: 0 % (ref 0.0–0.2)

## 2022-07-11 LAB — CMP (CANCER CENTER ONLY)
ALT: 173 U/L — ABNORMAL HIGH (ref 0–44)
AST: 93 U/L — ABNORMAL HIGH (ref 15–41)
Albumin: 4.6 g/dL (ref 3.5–5.0)
Alkaline Phosphatase: 288 U/L — ABNORMAL HIGH (ref 38–126)
Anion gap: 6 (ref 5–15)
BUN: 11 mg/dL (ref 8–23)
CO2: 28 mmol/L (ref 22–32)
Calcium: 9.8 mg/dL (ref 8.9–10.3)
Chloride: 103 mmol/L (ref 98–111)
Creatinine: 0.76 mg/dL (ref 0.61–1.24)
GFR, Estimated: >60 mL/min (ref >60)
Glucose, Bld: 107 mg/dL — ABNORMAL HIGH (ref 70–99)
Potassium: 4.2 mmol/L (ref 3.5–5.1)
Sodium: 137 mmol/L (ref 135–145)
Total Bilirubin: 0.8 mg/dL (ref 0.3–1.2)
Total Protein: 7.5 g/dL (ref 6.5–8.1)

## 2022-07-11 LAB — TOTAL PROTEIN, URINE DIPSTICK: Protein, ur: NEGATIVE mg/dL

## 2022-07-11 NOTE — Progress Notes (Signed)
[facility] [place], [place] [address] [phone]   Interval Evaluation  Date of Service: 07/11/22 [name] Patient MRN: [id number] Patient DOB: August 18, 1960 Provider: Ventura Sellers, MD  Identifying Statement:  Hunter Terry is a 61 y.o. male with left temporal glioblastoma   Oncologic History: Oncology History  Glioblastoma, IDH-wildtype ([place])  06/15/2020 Surgery   Stereotactic biopsy, L temporal Hunter Terry).  Path demonstrates glioblastoma IDH-1 wt   08/02/2020 Surgery   Debulking craniotomy with Dr. Marcello Terry   09/05/2020 - 10/17/2020 Radiation Therapy   IMRT with concurrent Temozolomide [name])   11/19/2020 -  Chemotherapy   Initiates cycle #1 adjuvant Temozolomide       07/17/2021 - 04/01/2022 Chemotherapy   Patient is on Treatment Plan : BRAIN Lomustine q42d     07/26/2021 -  Chemotherapy   Patient is on Treatment Plan : BRAIN GBM Bevacizumab 14d x 6 cycles     05/16/2022 -  Chemotherapy   Patient is on Treatment Plan : BRAIN Lomustine q42d       Biomarkers:  MGMT Unknown.  IDH 1/2 Wild type.  EGFR Unknown  TERT "Mutated   Interval History:  Hunter Terry presents today for follow up after recent MRI brain.  He has now completed 8 full cycles of CCNU/avastin.  No clinical changes today otherwise. No balance issues, motor dysfunction.  Continues on Keppra 767m BID, no further seizures.  H+P (07/24/20) Patient presented to medical attention in late October, 2021 with new onset seizure.  Event was decribed as loss of consciousness, sudden, without clarity on further details aside from altered mental status upon awakening.  CNS imaging demonsrated non-enhancing mass within left temporal lobe c/w likely glioma; he underwent stereotactic biopsy with Dr. TMarcello Mooreson 06/15/20.  There was significant delay in finalizing path, explaining delay in follow up and evaluation.  He denies any seizures since  discharge from hospital, on 4 anti-seizure drugs.  He does complain of dizziness and drowsiness with the medications, however.  Also describes impaired short term memory.  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    Medications: Current Outpatient Medications on File Prior to Visit  Medication Sig Dispense Refill   levETIRAcetam (KEPPRA) 750 MG tablet Take 1 tablet (750 mg total) by mouth 2 (two) times daily. 60 tablet 3   [DISCONTINUED] lacosamide (VIMPAT) 200 MG TABS tablet Take 0.5 tablets (100 mg total) by mouth 2 (two) times daily. 30 tablet 0   No current facility-administered medications on file prior to visit.    Allergies: No Known Allergies Past Medical History:  Past Medical History:  Diagnosis Date   Cancer ([place]    BRAIN TUMOR   Headache    Seizure ([facility]    Past Surgical History:  Past Surgical History:  Procedure Laterality Date   APPLICATION OF CRANIAL NAVIGATION N/A 06/15/2020   Procedure: APPLICATION OF CRANIAL NAVIGATION;  Surgeon: TVallarie Mare MD;  [facility]  Service: Neurosurgery;  Laterality: N/A;   APPLICATION OF CRANIAL NAVIGATION N/A 08/02/2020   Procedure: APPLICATION OF CRANIAL NAVIGATION;  Surgeon: TVallarie Mare MD;  [facility]  Service: Neurosurgery;  Laterality: N/A;   CRANIOTOMY N/A 08/02/2020   Procedure: CRANIOTOMY LEFT TEMPORAL LOBECTOMY FOR TUMOR EXCISION;  Surgeon: TVallarie Mare MD;  [facility]  Service: Neurosurgery;  Laterality: N/A;   FRAMELESS  BIOPSY WITH BRAINLAB Left 06/15/2020   Procedure: Left temporal lobe stereotactic brain  biopsy with brainlab;  Surgeon: [name], MD;  Location[facility];  Service: Neurosurgery;  Laterality: Left;   Social History:  Social History   Socioeconomic History   Marital status: Married    Spouse name: Not on file   Number of children: Not on file   Years of education: Not on file   Highest education level: Not on file  Occupational History   Not on  file  Tobacco Use   Smoking status: Never   Smokeless tobacco: Never  Substance and Sexual Activity   Alcohol use: Never   Drug use: Not on file   Sexual activity: Yes    Partners: Female  Other Topics Concern   Not on file  Social History Narrative   Not on file   Social Determinants of Health   Financial Resource Strain: Not on file  Food Insecurity: Not on file  Transportation Needs: Not on file  Physical Activity: Not on file  Stress: Not on file  Social Connections: Not on file  Intimate Partner Violence: Not on file   Family History:  Family History  Problem Relation Age of Onset   Cancer Neg Hx     Review of Systems: Constitutional: Doesn't report fevers, chills or abnormal weight loss Eyes: Doesn't report blurriness of vision Ears, nose, mouth, throat, and face: Doesn't report sore throat Respiratory: Doesn't report cough, dyspnea or wheezes Cardiovascular: Doesn't report palpitation, chest discomfort  Gastrointestinal:  Doesn't report nausea, constipation, diarrhea GU: Doesn't report incontinence Skin: Doesn't report skin rashes Neurological: Per HPI Musculoskeletal: Doesn't report joint pain Behavioral/Psych: Doesn't report anxiety  Physical Exam: Vitals:   07/11/22 0939  BP: 130/71  Pulse: 70  Resp: 14  Temp: 97.9 F (36.6 C)  SpO2: 99%    KPS: 90. General: Alert, cooperative, pleasant, in no acute distress Head: Normal EENT: No conjunctival injection or scleral icterus.  Lungs: Resp effort normal Cardiac: Regular rate Abdomen: Non-distended abdomen Skin: No rashes cyanosis or petechiae. Extremities: No clubbing or edema  Neurologic Exam: Mental Status: Awake, alert, attentive to examiner. Oriented to self and environment. Language is fluent with intact comprehension.  Cranial Nerves: Visual acuity is grossly normal. Visual fields are full. Extra-ocular movements intact. No ptosis. Face is symmetric Motor: Tone and bulk are normal. Power  is full in both arms and legs. Reflexes are symmetric, no pathologic reflexes present.  Sensory: Intact to light touch Gait: Normal.   Labs: I have reviewed the data as listed    Component Value Date/Time   NA 136 06/20/2022 0938   K 4.4 06/20/2022 0938   CL 102 06/20/2022 0938   CO2 28 06/20/2022 0938   GLUCOSE 100 (H) 06/20/2022 0938   BUN 11 06/20/2022 0938   CREATININE 0.80 06/20/2022 0938   CALCIUM 9.5 06/20/2022 0938   PROT 7.2 06/20/2022 0938   ALBUMIN 4.4 06/20/2022 0938   AST 114 (H) 06/20/2022 0938   ALT 231 (H) 06/20/2022 0938   ALKPHOS 235 (H) 06/20/2022 0938   BILITOT 1.0 06/20/2022 0938   GFRNONAA >60 06/20/2022 0938   Lab Results  Component Value Date   WBC 2.9 (L) 07/11/2022   NEUTROABS 1.6 (L) 07/11/2022   HGB 14.9 07/11/2022   HCT 42.8 07/11/2022   MCV 90.5 07/11/2022   PLT 126 (L) 07/11/2022    Imaging:  [place] Clinician Interpretation: I have personally reviewed the CNS images as listed.  My interpretation, in the context of the patient's clinical presentation, is progressive disease  MR BRAIN W WO CONTRAST  Result Date: 07/07/2022 CLINICAL DATA:  Brain/CNS neoplasm, assess treatment response. IDH wild type glioblastoma. No new symptoms. EXAM: MRI HEAD WITHOUT AND WITH CONTRAST TECHNIQUE: Multiplanar, multiecho pulse sequences of the brain and surrounding structures were obtained without and with intravenous contrast. CONTRAST:  40m GADAVIST GADOBUTROL 1 MMOL/ML IV SOLN COMPARISON:  MR head without and with contrast 05/08/2022 and 02/02/2022 FINDINGS: Brain: Enhancement along the superior aspect of the resection cavity demonstrate significant progression since the prior exam, now measuring 2.1 x 2.9 x 2.0 cm compared with previous measurements of 1.2 x 2.6 x 1.0 cm. A component of the progressive enhancement is peripheral with restricted diffusion in the central aspect of the lesion. Increased T2 signal is also noted superior to the resection cavity. A more  cystic component posterior to the resection cavity is stable. Cystic changes at the temporal tip related to the resection are stable. No distal enhancement is present. More superior T2 changes are stable, likely related to treatment are stable. The ventricles are of normal size. No significant extraaxial fluid collection is present. The internal auditory canals are within normal limits. The brainstem and cerebellum are within normal limits. Vascular: Flow is present in the major intracranial arteries. Skull and upper cervical spine: Mild degenerative changes are present the upper cervical spine, stable. Craniocervical junction is normal. Midline structures are otherwise unremarkable. Sinuses/Orbits: The paranasal sinuses and mastoid air cells are clear. The globes and orbits are within normal limits. IMPRESSION: 1. Significant progression of enhancement along the superior aspect of the resection cavity, now measuring 2.1 x 2.9 x 2.0 cm compared with previous measurements of 1.2 x 2.6 x 1.0 cm is concerning for progressive disease. 2. Increased T2 signal superior to the resection cavity is also concerning for progression of disease. 3. More superior T2 signal changes are stable, likely related to treatment are stable. Electronically Signed   By: CSan MorelleM.D.   On: 07/07/2022 07:55     Assessment/Plan Glioblastoma, IDH-wildtype ([place] [C71.9]  Dontre RSawinis clinically stable today, now s/p cycle #8 of CCNU/Avastin.  No new or progressive deficits. Elevated liver enzymes are secondary to CCNU chemotherapy.  MRI brain unfortunately demonstrates nodular progression of disease within the left temporal lobe.  CCNU avastin will be stopped.  We reviewed treatment plan pathways, including third line chemotherapy, re-resection.  He is agreeable with evaluation with Dr. TMarcello Mooresfor re-resection.  Dr. TMarcello Mooresis requesting a 6-week washout interval, which would occur 08/01/22.  Will need a surgical  planning MRI prior to that, we will defer to his office on setting this up.  If surgery is not available, we will consider carboplatin AUC 4 IV q 4weeks, with or without avastin.  Keppra will stay at 7573mBID.  We [facility] that [facility] for further instructions from Dr. ThMarcello Mooreswe will con't to be in touch.   We appreciate the opportunity to participate in the care of [place]   All questions were answered. The patient knows to call the clinic with any problems, questions or concerns. No barriers to learning were detected.  The total time spent in the encounter was 40 minutes and more than 50% was on counseling and review of test results   ZaVentura SellersMD Medical Director of Neuro-Oncology [facility] WeTunica Resorts1/30/23 9:31 AM

## 2022-07-13 ENCOUNTER — Other Ambulatory Visit: Payer: Self-pay

## 2022-07-15 ENCOUNTER — Other Ambulatory Visit: Payer: Self-pay | Admitting: Internal Medicine

## 2022-07-15 ENCOUNTER — Inpatient Hospital Stay: Payer: Medicaid Other

## 2022-07-15 ENCOUNTER — Other Ambulatory Visit: Payer: Self-pay

## 2022-07-15 DIAGNOSIS — C719 Malignant neoplasm of brain, unspecified: Secondary | ICD-10-CM

## 2022-07-18 ENCOUNTER — Telehealth: Payer: Self-pay | Admitting: *Deleted

## 2022-07-18 NOTE — Telephone Encounter (Signed)
Prepared letters for family members to request medical visa for travel to [place]

## 2022-07-31 ENCOUNTER — Other Ambulatory Visit: Payer: Self-pay | Admitting: Radiation Therapy

## 2022-07-31 ENCOUNTER — Ambulatory Visit (HOSPITAL_COMMUNITY)
Admission: RE | Admit: 2022-07-31 | Discharge: 2022-07-31 | Disposition: A | Discharge status: Home / Self Care | Payer: Medicaid Other | Source: Ambulatory Visit | Attending: Internal Medicine | Admitting: Internal Medicine

## 2022-07-31 DIAGNOSIS — C719 Malignant neoplasm of brain, unspecified: Secondary | ICD-10-CM | POA: Insufficient documentation

## 2022-07-31 MED ORDER — GADOBUTROL 1 MMOL/ML IV SOLN
6.0000 mL | Freq: Once | INTRAVENOUS | Status: AC | PRN
  Administered 2022-07-31: 6 mL via INTRAVENOUS

## 2022-08-01 ENCOUNTER — Other Ambulatory Visit: Payer: Self-pay

## 2022-08-01 ENCOUNTER — Inpatient Hospital Stay: Payer: Medicaid Other | Attending: Internal Medicine

## 2022-08-01 ENCOUNTER — Inpatient Hospital Stay (HOSPITAL_BASED_OUTPATIENT_CLINIC_OR_DEPARTMENT_OTHER): Payer: Medicaid Other | Admitting: Internal Medicine

## 2022-08-01 VITALS — BP 131/66 | HR 67 | Temp 97.8°F | Resp 15 | Ht 65.0 in | Wt 153.1 lb

## 2022-08-01 DIAGNOSIS — C719 Malignant neoplasm of brain, unspecified: Secondary | ICD-10-CM | POA: Diagnosis not present

## 2022-08-01 DIAGNOSIS — R569 Unspecified convulsions: Secondary | ICD-10-CM | POA: Diagnosis not present

## 2022-08-01 DIAGNOSIS — C712 Malignant neoplasm of temporal lobe: Secondary | ICD-10-CM | POA: Diagnosis present

## 2022-08-01 LAB — CBC WITH DIFFERENTIAL (CANCER CENTER ONLY)
Abs Immature Granulocytes: 0.01 10*3/uL (ref 0.00–0.07)
Basophils Absolute: 0 10*3/uL (ref 0.0–0.1)
Basophils Relative: 0 %
Eosinophils Absolute: 0.1 10*3/uL (ref 0.0–0.5)
Eosinophils Relative: 2 %
HCT: 41.6 % (ref 39.0–52.0)
Hemoglobin: 14.7 g/dL (ref 13.0–17.0)
Immature Granulocytes: 0 %
Lymphocytes Relative: 18 %
Lymphs Abs: 0.7 10*3/uL (ref 0.7–4.0)
MCH: 32 pg (ref 26.0–34.0)
MCHC: 35.3 g/dL (ref 30.0–36.0)
MCV: 90.4 fL (ref 80.0–100.0)
Monocytes Absolute: 0.5 10*3/uL (ref 0.1–1.0)
Monocytes Relative: 12 %
Neutro Abs: 2.9 10*3/uL (ref 1.7–7.7)
Neutrophils Relative %: 68 %
Platelet Count: 157 10*3/uL (ref 150–400)
RBC: 4.6 MIL/uL (ref 4.22–5.81)
RDW: 13.2 % (ref 11.5–15.5)
WBC Count: 4.2 10*3/uL (ref 4.0–10.5)
nRBC: 0 % (ref 0.0–0.2)

## 2022-08-01 LAB — CMP (CANCER CENTER ONLY)
ALT: 133 U/L — ABNORMAL HIGH (ref 0–44)
AST: 86 U/L — ABNORMAL HIGH (ref 15–41)
Albumin: 4.5 g/dL (ref 3.5–5.0)
Alkaline Phosphatase: 322 U/L — ABNORMAL HIGH (ref 38–126)
Anion gap: 4 — ABNORMAL LOW (ref 5–15)
BUN: 11 mg/dL (ref 8–23)
CO2: 32 mmol/L (ref 22–32)
Calcium: 9.8 mg/dL (ref 8.9–10.3)
Chloride: 99 mmol/L (ref 98–111)
Creatinine: 0.85 mg/dL (ref 0.61–1.24)
GFR, Estimated: >60 mL/min (ref >60)
Glucose, Bld: 98 mg/dL (ref 70–99)
Potassium: 4.7 mmol/L (ref 3.5–5.1)
Sodium: 135 mmol/L (ref 135–145)
Total Bilirubin: 0.6 mg/dL (ref 0.3–1.2)
Total Protein: 7.4 g/dL (ref 6.5–8.1)

## 2022-08-01 LAB — TOTAL PROTEIN, URINE DIPSTICK: Protein, ur: <30 mg/dL — AB

## 2022-08-01 MED ORDER — LEVETIRACETAM 750 MG PO TABS: 750.0000 mg | ORAL_TABLET | Freq: Two times a day (BID) | ORAL | 3 refills | Status: DC

## 2022-08-01 NOTE — Progress Notes (Signed)
[facility] at [place],  [address] [phone]   Interval Evaluation  Date of Service: 08/01/22 [name] Patient MRN: [id number] Patient DOB: 19-May-1961 Provider: Ventura Sellers, MD  Identifying Statement:  Hunter Terry is a 61 y.o. male with left temporal glioblastoma   Oncologic History: Oncology History  Glioblastoma, IDH-wildtype ([place])  06/15/2020 Surgery   Stereotactic biopsy, L temporal [name]).  Path demonstrates glioblastoma IDH-1 wt   08/02/2020 Surgery   Debulking craniotomy with Dr. [name]   09/05/2020 - 10/17/2020 Radiation Therapy   IMRT with concurrent Temozolomide [name])   11/19/2020 -  Chemotherapy   Initiates cycle #1 adjuvant Temozolomide       07/17/2021 - 04/01/2022 Chemotherapy   Patient is on Treatment Plan : BRAIN Lomustine q42d     07/26/2021 -  Chemotherapy   Patient is on Treatment Plan : BRAIN GBM Bevacizumab 14d x 6 cycles     05/16/2022 -  Chemotherapy   Patient is on Treatment Plan : BRAIN Lomustine q42d       Biomarkers:  MGMT Unknown.  IDH 1/2 Wild type.  EGFR Unknown  TERT "Mutated   Interval History:  [name] presents today for follow up after recent MRI brain.  No issues since avastin was withdrawn, was able to obtain surgical planning MRI study.  No clinical changes today otherwise. No balance issues, motor dysfunction.  Continues on Keppra 776m BID, no further seizures.  H+P (07/24/20) Patient presented to medical attention in late October, 2021 with new onset seizure.  Event was decribed as loss of consciousness, sudden, without clarity on further details aside from altered mental status upon awakening.  CNS imaging demonsrated non-enhancing mass within left temporal lobe c/w likely glioma; he underwent stereotactic biopsy with Dr. TMarcello Mooreson 06/15/20.  There was significant delay in finalizing path, explaining delay in follow up and  evaluation.  He denies any seizures since discharge from hospital, on 4 anti-seizure drugs.  He does complain of dizziness and drowsiness with the medications, however.  Also describes impaired short term memory.  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    Medications: Current Outpatient Medications on File Prior to Visit  Medication Sig Dispense Refill   levETIRAcetam (KEPPRA) 750 MG tablet Take 1 tablet (750 mg total) by mouth 2 (two) times daily. 60 tablet 3   [DISCONTINUED] lacosamide (VIMPAT) 200 MG TABS tablet Take 0.5 tablets (100 mg total) by mouth 2 (two) times daily. 30 tablet 0   No current facility-administered medications on file prior to visit.    Allergies: No Known Allergies Past Medical History:  Past Medical History:  Diagnosis Date   Cancer ([facility]    BRAIN TUMOR   Headache    Seizure ([facility]    Past Surgical History:  Past Surgical History:  Procedure Laterality Date   APPLICATION OF CRANIAL NAVIGATION N/A 06/15/2020   Procedure: APPLICATION OF CRANIAL NAVIGATION;  Surgeon: TVallarie Mare MD;  [place]  Service: Neurosurgery;  Laterality: N/A;   APPLICATION OF CRANIAL NAVIGATION N/A 08/02/2020   Procedure: APPLICATION OF CRANIAL NAVIGATION;  Surgeon: TVallarie Mare MD;  [place]  Service: Neurosurgery;  Laterality: N/A;   CRANIOTOMY N/A 08/02/2020   Procedure: CRANIOTOMY LEFT TEMPORAL LOBECTOMY FOR TUMOR EXCISION;  Surgeon: TVallarie Mare MD;  [place] [facility]  Service: Neurosurgery;  Laterality: N/A;   FRAMELESS  BIOPSY WITH BRAINLAB Left 06/15/2020   Procedure:  Left temporal lobe stereotactic brain biopsy with brainlab;  Surgeon: [name], MD;  Location: [place];  Service: Neurosurgery;  Laterality: Left;   Social History:  Social History   Socioeconomic History   Marital status: Married    Spouse name: Not on file   Number of children: Not on file   Years of education: Not on file   Highest education level: Not  on file  Occupational History   Not on file  Tobacco Use   Smoking status: Never   Smokeless tobacco: Never  Substance and Sexual Activity   Alcohol use: Never   Drug use: Not on file   Sexual activity: Yes    Partners: Female  Other Topics Concern   Not on file  Social History Narrative   Not on file   Social Determinants of Health   Financial Resource Strain: Not on file  Food Insecurity: Not on file  Transportation Needs: Not on file  Physical Activity: Not on file  Stress: Not on file  Social Connections: Not on file  Intimate Partner Violence: Not on file   Family History:  Family History  Problem Relation Age of Onset   Cancer Neg Hx     Review of Systems: Constitutional: Doesn't report fevers, chills or abnormal weight loss Eyes: Doesn't report blurriness of vision Ears, nose, mouth, throat, and face: Doesn't report sore throat Respiratory: Doesn't report cough, dyspnea or wheezes Cardiovascular: Doesn't report palpitation, chest discomfort  Gastrointestinal:  Doesn't report nausea, constipation, diarrhea GU: Doesn't report incontinence Skin: Doesn't report skin rashes Neurological: Per HPI Musculoskeletal: Doesn't report joint pain Behavioral/Psych: Doesn't report anxiety  Physical Exam: Vitals:   08/01/22 1041  BP: 131/66  Pulse: 67  Resp: 15  Temp: 97.8 F (36.6 C)  SpO2: 99%    KPS: 90. General: Alert, cooperative, pleasant, in no acute distress Head: Normal EENT: No conjunctival injection or scleral icterus.  Lungs: Resp effort normal Cardiac: Regular rate Abdomen: Non-distended abdomen Skin: No rashes cyanosis or petechiae. Extremities: No clubbing or edema  Neurologic Exam: Mental Status: Awake, alert, attentive to examiner. Oriented to self and environment. Language is fluent with intact comprehension.  Cranial Nerves: Visual acuity is grossly normal. Visual fields are full. Extra-ocular movements intact. No ptosis. Face is  symmetric Motor: Tone and bulk are normal. Power is full in both arms and legs. Reflexes are symmetric, no pathologic reflexes present.  Sensory: Intact to light touch Gait: Normal.   Labs: I have reviewed the data as listed    Component Value Date/Time   NA 137 07/11/2022 0900   K 4.2 07/11/2022 0900   CL 103 07/11/2022 0900   CO2 28 07/11/2022 0900   GLUCOSE 107 (H) 07/11/2022 0900   BUN 11 07/11/2022 0900   CREATININE 0.76 07/11/2022 0900   CALCIUM 9.8 07/11/2022 0900   PROT 7.5 07/11/2022 0900   ALBUMIN 4.6 07/11/2022 0900   AST 93 (H) 07/11/2022 0900   ALT 173 (H) 07/11/2022 0900   ALKPHOS 288 (H) 07/11/2022 0900   BILITOT 0.8 07/11/2022 0900   GFRNONAA >60 07/11/2022 0900   Lab Results  Component Value Date   WBC 4.2 08/01/2022   NEUTROABS 2.9 08/01/2022   HGB 14.7 08/01/2022   HCT 41.6 08/01/2022   MCV 90.4 08/01/2022   PLT 157 08/01/2022    Imaging:  [facility] Clinician Interpretation: I have personally reviewed the CNS images as listed.  My interpretation, in the context of the patient's clinical presentation, is progressive  disease  MR BRAIN W WO CONTRAST  Result Date: 07/07/2022 CLINICAL DATA:  Brain/CNS neoplasm, assess treatment response. IDH wild type glioblastoma. No new symptoms. EXAM: MRI HEAD WITHOUT AND WITH CONTRAST TECHNIQUE: Multiplanar, multiecho pulse sequences of the brain and surrounding structures were obtained without and with intravenous contrast. CONTRAST:  35m GADAVIST GADOBUTROL 1 MMOL/ML IV SOLN COMPARISON:  MR head without and with contrast 05/08/2022 and 02/02/2022 FINDINGS: Brain: Enhancement along the superior aspect of the resection cavity demonstrate significant progression since the prior exam, now measuring 2.1 x 2.9 x 2.0 cm compared with previous measurements of 1.2 x 2.6 x 1.0 cm. A component of the progressive enhancement is peripheral with restricted diffusion in the central aspect of the lesion. Increased T2 signal is also noted  superior to the resection cavity. A more cystic component posterior to the resection cavity is stable. Cystic changes at the temporal tip related to the resection are stable. No distal enhancement is present. More superior T2 changes are stable, likely related to treatment are stable. The ventricles are of normal size. No significant extraaxial fluid collection is present. The internal auditory canals are within normal limits. The brainstem and cerebellum are within normal limits. Vascular: Flow is present in the major intracranial arteries. Skull and upper cervical spine: Mild degenerative changes are present the upper cervical spine, stable. Craniocervical junction is normal. Midline structures are otherwise unremarkable. Sinuses/Orbits: The paranasal sinuses and mastoid air cells are clear. The globes and orbits are within normal limits. IMPRESSION: 1. Significant progression of enhancement along the superior aspect of the resection cavity, now measuring 2.1 x 2.9 x 2.0 cm compared with previous measurements of 1.2 x 2.6 x 1.0 cm is concerning for progressive disease. 2. Increased T2 signal superior to the resection cavity is also concerning for progression of disease. 3. More superior T2 signal changes are stable, likely related to treatment are stable. Electronically Signed   By: CSan MorelleM.D.   On: 07/07/2022 07:55     Assessment/Plan Glioblastoma, IDH-wildtype ([place] [C71.9]  Zaeden RRaineriis clinically stable today, now having completed surgical planning MRI study.  No new or progressive deficits.   MRI brain is pending official report, but shows no significant change in tumor burden within left temporal lobe.  He is agreeable with evaluation with Dr. TMarcello Mooresfor re-resection.  Avastin is now completely washed out.  We will plan to follow with metronomic TMZ post-craniotomy.  Keppra will stay at 7575mBID.  We ask that [facility] for further instructions from Dr. ThMarcello Moores we will con't to be in touch.   We appreciate the opportunity to participate in the care of EuGoltry   All questions were answered. The patient knows to call the clinic with any problems, questions or concerns. No barriers to learning were detected.  The total time spent in the encounter was 30 minutes and more than 50% was on counseling and review of test results   ZaVentura SellersMD Medical Director of Neuro-Oncology [facility] [place]2/21/23 10:56 AM

## 2022-08-13 ENCOUNTER — Telehealth: Payer: Self-pay | Admitting: *Deleted

## 2022-08-13 NOTE — Telephone Encounter (Signed)
Attempted to reach interpretor to see if patient has scheduled craniotomy as of yet.  Nothing is in the system.  Need update.  Left message.

## 2022-08-19 ENCOUNTER — Inpatient Hospital Stay: Payer: Medicaid Other

## 2022-08-22 ENCOUNTER — Other Ambulatory Visit: Payer: Self-pay | Admitting: Neurosurgery

## 2022-08-23 ENCOUNTER — Other Ambulatory Visit: Payer: Self-pay

## 2022-08-27 ENCOUNTER — Other Ambulatory Visit: Payer: Self-pay | Admitting: Neurosurgery

## 2022-08-27 ENCOUNTER — Other Ambulatory Visit: Payer: Self-pay

## 2022-09-02 NOTE — Pre-Procedure Instructions (Signed)
Surgical Instructions    Your procedure is scheduled on Thursday, January 25.  Report to [facility] Main Entrance "A" at 8:00 A.M., then check in with the Admitting office.  Call this number if you have problems the morning of surgery:  [phone]   If you have any questions prior to your surgery date call [phone]: Open Monday-Friday 8am-4pm If you experience any cold or flu symptoms such as cough, fever, chills, shortness of breath, etc. between now and your scheduled surgery, please notify us at the above number     Remember:  Do not eat or drink after midnight the night before your surgery      Take these medicines the morning of surgery with A SIP OF WATER:  levETIRAcetam (KEPPRA)  acetaminophen (TYLENOL) if needed   As of today, STOP taking any Aspirin (unless otherwise instructed by your surgeon) Aleve, Naproxen, Ibuprofen, Motrin, Advil, Goody's, BC's, all herbal medications, fish oil, and all vitamins.             [facility] is not responsible for any belongings or valuables.    Do NOT Smoke (Tobacco/Vaping)  24 hours prior to your procedure  If you use a CPAP at night, you may bring your mask for your overnight stay.   Contacts, glasses, hearing aids, dentures or partials may not be worn into surgery, please bring cases for these belongings   For patients admitted to the hospital, discharge time will be determined by your treatment team.   Patients discharged the day of surgery will not be allowed to drive home, and someone needs to stay with them for 24 hours.   SURGICAL WAITING ROOM VISITATION Patients having surgery or a procedure may have no more than 2 support people in the waiting area - these visitors may rotate.   Children under the age of 27 must have an adult with them who is not the patient. If the patient needs to stay at the hospital during part of their recovery, the visitor guidelines for inpatient rooms apply. Pre-op nurse will coordinate  an appropriate time for 1 support person to accompany patient in pre-op.  This support person may not rotate.   Please refer to [email or web address] for the visitor guidelines for Inpatients (after your surgery is over and you are in a regular room).    Special instructions:    Oral Hygiene is also important to reduce your risk of infection.  Remember - BRUSH YOUR TEETH THE MORNING OF SURGERY WITH YOUR REGULAR TOOTHPASTE   - Preparing For Surgery  Before surgery, you can play an important role. Because skin is not sterile, your skin needs to be as free of germs as possible. You can reduce the number of germs on your skin by washing with CHG (chlorahexidine gluconate) Soap before surgery.  CHG is an antiseptic cleaner which kills germs and bonds with the skin to continue killing germs even after washing.     Please do not use if you have an allergy to CHG or antibacterial soaps. If your skin becomes reddened/irritated stop using the CHG.  Do not shave (including legs and underarms) for at least 48 hours prior to first CHG shower. It is OK to shave your face.  Please follow these instructions carefully.     Shower the NIGHT BEFORE SURGERY and the MORNING OF SURGERY with CHG Soap.   If you chose to wash your hair, wash your hair first as usual with your normal shampoo. After you shampoo,  rinse your hair and body thoroughly to remove the shampoo.  Then [employer] and genitals (private parts) with your normal soap and rinse thoroughly to remove soap.  After that Use CHG Soap as you would any other liquid soap. You can apply CHG directly to the skin and wash gently with a scrungie or a clean washcloth.   Apply the CHG Soap to your body ONLY FROM THE NECK DOWN.  Do not use on open wounds or open sores. Avoid contact with your eyes, ears, mouth and genitals (private parts). Wash Face and genitals (private parts)  with your normal soap.    Wash thoroughly, paying special attention to the area where your surgery will be performed.  Thoroughly rinse your body with warm water from the neck down.  DO NOT shower/wash with your normal soap after using and rinsing off the CHG Soap.  Pat yourself dry with a CLEAN TOWEL.  Wear CLEAN PAJAMAS to bed the night before surgery  Place CLEAN SHEETS on your bed the night before your surgery  DO NOT SLEEP WITH PETS.   Day of Surgery:  Take a shower with CHG soap. Wear Clean/Comfortable clothing the morning of surgery Do not wear jewelry or makeup. Do not wear lotions, powders, perfumes/cologne or deodorant. Do not shave 48 hours prior to surgery.  Men may shave face and neck. Do not bring valuables to the hospital. Do not wear nail polish, gel polish, artificial nails, or any other type of covering on natural nails (fingers and toes) If you have artificial nails or gel coating that need to be removed by a nail salon, please have this removed prior to surgery. Artificial nails or gel coating may interfere with anesthesia's ability to adequately monitor your vital signs. Remember to brush your teeth WITH YOUR REGULAR TOOTHPASTE.    If you received a COVID test during your pre-op visit, it is requested that you wear a mask when out in public, stay away from anyone that may not be feeling well, and notify your surgeon if you develop symptoms. If you have been in contact with anyone that has tested positive in the last 10 days, please notify your surgeon.    Please read over the following fact sheets that you were given.

## 2022-09-03 ENCOUNTER — Encounter (HOSPITAL_COMMUNITY)
Admission: RE | Admit: 2022-09-03 | Discharge: 2022-09-03 | Disposition: A | Discharge status: Home / Self Care | Payer: Medicaid Other | Source: Ambulatory Visit | Attending: Neurosurgery | Admitting: Neurosurgery

## 2022-09-03 ENCOUNTER — Encounter (HOSPITAL_COMMUNITY): Payer: Self-pay

## 2022-09-03 ENCOUNTER — Other Ambulatory Visit: Payer: Self-pay

## 2022-09-03 VITALS — BP 135/80 | HR 83 | Temp 98.1°F | Resp 17 | Ht 66.0 in | Wt 150.0 lb

## 2022-09-03 DIAGNOSIS — Z01812 Encounter for preprocedural laboratory examination: Secondary | ICD-10-CM | POA: Diagnosis not present

## 2022-09-03 DIAGNOSIS — R569 Unspecified convulsions: Secondary | ICD-10-CM | POA: Diagnosis not present

## 2022-09-03 DIAGNOSIS — Z01818 Encounter for other preprocedural examination: Secondary | ICD-10-CM

## 2022-09-03 LAB — CBC
HCT: 41.8 % (ref 39.0–52.0)
Hemoglobin: 14.7 g/dL (ref 13.0–17.0)
MCH: 31.9 pg (ref 26.0–34.0)
MCHC: 35.2 g/dL (ref 30.0–36.0)
MCV: 90.7 fL (ref 80.0–100.0)
Platelets: 223 10*3/uL (ref 150–400)
RBC: 4.61 MIL/uL (ref 4.22–5.81)
RDW: 13.2 % (ref 11.5–15.5)
WBC: 3.7 10*3/uL — ABNORMAL LOW (ref 4.0–10.5)
nRBC: 0 % (ref 0.0–0.2)

## 2022-09-03 LAB — BASIC METABOLIC PANEL
Anion gap: 9 (ref 5–15)
BUN: 10 mg/dL (ref 8–23)
CO2: 26 mmol/L (ref 22–32)
Calcium: 9.2 mg/dL (ref 8.9–10.3)
Chloride: 99 mmol/L (ref 98–111)
Creatinine, Ser: 0.76 mg/dL (ref 0.61–1.24)
GFR, Estimated: >60 mL/min (ref >60)
Glucose, Bld: 106 mg/dL — ABNORMAL HIGH (ref 70–99)
Potassium: 4.1 mmol/L (ref 3.5–5.1)
Sodium: 134 mmol/L — ABNORMAL LOW (ref 135–145)

## 2022-09-03 LAB — NO BLOOD PRODUCTS

## 2022-09-03 NOTE — Progress Notes (Signed)
Notified dr. [name] of blood products refusal (albumin okay).

## 2022-09-03 NOTE — Pre-Procedure Instructions (Signed)
Surgical Instructions    Your procedure is scheduled on Thursday, January 25.  Report to [facility] Main Entrance "A" at 5:30 A.M., then check in with the Admitting office.  Call this number if you have problems the morning of surgery:  [phone]   If you have any questions prior to your surgery date call [phone]: Open Monday-Friday 8am-4pm If you experience any cold or flu symptoms such as cough, fever, chills, shortness of breath, etc. between now and your scheduled surgery, please notify us at the above number     Remember:  Do not eat or drink after midnight the night before your surgery      Take these medicines the morning of surgery with A SIP OF WATER:  levETIRAcetam (KEPPRA)  acetaminophen (TYLENOL) if needed   As of today, STOP taking any Aspirin (unless otherwise instructed by your surgeon) Aleve, Naproxen, Ibuprofen, Motrin, Advil, Goody's, BC's, all herbal medications, fish oil, and all vitamins.             [facility] [facility] is not responsible for any belongings or valuables.    Do NOT Smoke (Tobacco/Vaping)  24 hours prior to your procedure  If you use a CPAP at night, you may bring your mask for your overnight stay.   Contacts, glasses, hearing aids, dentures or partials may not be worn into surgery, please bring cases for these belongings   For patients admitted to the hospital, discharge time will be determined by your treatment team.   Patients discharged the day of surgery will not be allowed to drive home, and someone needs to stay with them for 24 hours.   SURGICAL WAITING ROOM VISITATION Patients having surgery or a procedure may have no more than 2 support people in the waiting area - these visitors may rotate.   Children under the age of 44 must have an adult with them who is not the patient. If the patient needs to stay at the hospital during part of their recovery, the visitor guidelines for inpatient rooms apply. Pre-op nurse will coordinate  an appropriate time for 1 support person to accompany patient in pre-op.  This support person may not rotate.   Please refer to [email or web address] for the visitor guidelines for Inpatients (after your surgery is over and you are in a regular room).    Special instructions:    Oral Hygiene is also important to reduce your risk of infection.  Remember - BRUSH YOUR TEETH THE MORNING OF SURGERY WITH YOUR REGULAR TOOTHPASTE   [place]- Preparing For Surgery  Before surgery, you can play an important role. Because skin is not sterile, your skin needs to be as free of germs as possible. You can reduce the number of germs on your skin by washing with CHG (chlorahexidine gluconate) Soap before surgery.  CHG is an antiseptic cleaner which kills germs and bonds with the skin to continue killing germs even after washing.     Please do not use if you have an allergy to CHG or antibacterial soaps. If your skin becomes reddened/irritated stop using the CHG.  Do not shave (including legs and underarms) for at least 48 hours prior to first CHG shower. It is OK to shave your face.  Please follow these instructions carefully.     Shower the NIGHT BEFORE SURGERY and the MORNING OF SURGERY with CHG Soap.   If you chose to wash your hair, wash your hair first as usual with your normal shampoo. After you shampoo,  rinse your hair and body thoroughly to remove the shampoo.  Then [employer] and genitals (private parts) with your normal soap and rinse thoroughly to remove soap.  After that Use CHG Soap as you would any other liquid soap. You can apply CHG directly to the skin and wash gently with a scrungie or a clean washcloth.   Apply the CHG Soap to your body ONLY FROM THE NECK DOWN.  Do not use on open wounds or open sores. Avoid contact with your eyes, ears, mouth and genitals (private parts). Wash Face and genitals (private parts)  with your normal soap.    Wash thoroughly, paying special attention to the area where your surgery will be performed.  Thoroughly rinse your body with warm water from the neck down.  DO NOT shower/wash with your normal soap after using and rinsing off the CHG Soap.  Pat yourself dry with a CLEAN TOWEL.  Wear CLEAN PAJAMAS to bed the night before surgery  Place CLEAN SHEETS on your bed the night before your surgery  DO NOT SLEEP WITH PETS.   Day of Surgery: Take a shower with CHG soap. Wear Clean/Comfortable clothing the morning of surgery Do not wear jewelry or makeup. Do not wear lotions, powders, perfumes/cologne or deodorant. Do not shave 48 hours prior to surgery.  Men may shave face and neck. Do not bring valuables to the hospital. Do not wear nail polish, gel polish, artificial nails, or any other type of covering on natural nails (fingers and toes) If you have artificial nails or gel coating that need to be removed by a nail salon, please have this removed prior to surgery. Artificial nails or gel coating may interfere with anesthesia's ability to adequately monitor your vital signs. Remember to brush your teeth WITH YOUR REGULAR TOOTHPASTE.    If you received a COVID test during your pre-op visit, it is requested that you wear a mask when out in public, stay away from anyone that may not be feeling well, and notify your surgeon if you develop symptoms. If you have been in contact with anyone that has tested positive in the last 10 days, please notify your surgeon.    Please read over the following fact sheets that you were given.

## 2022-09-03 NOTE — Progress Notes (Signed)
PCP - no pcp Cardiologist -denies   PPM/ICD - denies   Chest x-ray - 09/03/22 EKG - 08/26/22 Stress Test - denies ECHO - 08/14/22 Cardiac Cath - 08/01/22  Sleep Study - denies    ERAS Protcol -no  COVID TEST- not needed   Anesthesia review: no  Patient denies shortness of breath, fever, cough and chest pain at PAT appointment   All instructions explained to the patient, with a verbal understanding of the material. Patient agrees to go over the instructions while at home for a better understanding. Patient also instructed to self quarantine after being tested for COVID-19. The opportunity to ask questions was provided.

## 2022-09-04 NOTE — Anesthesia Preprocedure Evaluation (Signed)
Anesthesia Evaluation  Patient identified by MRN, date of birth, ID band Patient awake    Reviewed: Allergy & Precautions, NPO status , Patient's Chart, lab work & pertinent test results  Airway Mallampati: II  TM Distance: >3 FB Neck ROM: Full    Dental no notable dental hx.    Pulmonary    Pulmonary exam normal        Cardiovascular negative cardio ROS  Rhythm:Regular Rate:Normal     Neuro/Psych  Headaches, Seizures -,  Brain tumor  negative psych ROS   GI/Hepatic negative GI ROS, Neg liver ROS,,,  Endo/Other  negative endocrine ROS    Renal/GU negative Renal ROS     Musculoskeletal negative musculoskeletal ROS (+)    Abdominal Normal abdominal exam  (+)   Peds  Hematology negative hematology ROS (+)   Anesthesia Other Findings   Reproductive/Obstetrics negative OB ROS                             Anesthesia Physical Anesthesia Plan  ASA: 3  Anesthesia Plan: General   Post-op Pain Management:    Induction: Intravenous  PONV Risk Score and Plan: 2 and Ondansetron, Dexamethasone, Midazolam and Treatment may vary due to age or medical condition  Airway Management Planned: Mask and Oral ETT  Additional Equipment: Arterial line  Intra-op Plan:   Post-operative Plan: Extubation in OR  Informed Consent: I have reviewed the patients History and Physical, chart, labs and discussed the procedure including the risks, benefits and alternatives for the proposed anesthesia with the patient or authorized representative who has indicated his/her understanding and acceptance.     Dental advisory given  Plan Discussed with: CRNA  Anesthesia Plan Comments: (Lab Results      Component                Value               Date                      WBC                      3.7 (L)             09/03/2022                HGB                      14.7                09/03/2022                 HCT                      41.8                09/03/2022                MCV                      90.7                09/03/2022                PLT                      223  09/03/2022             Lab Results      Component                Value               Date                      NA                       134 (L)             09/03/2022                K                        4.1                 09/03/2022                CO2                      26                  09/03/2022                GLUCOSE                  106 (H)             09/03/2022                BUN                      10                  09/03/2022                CREATININE               0.76                09/03/2022                CALCIUM                  9.2                 09/03/2022                GFRNONAA                 >60                 09/03/2022           )       Anesthesia Quick Evaluation

## 2022-09-05 ENCOUNTER — Inpatient Hospital Stay (HOSPITAL_COMMUNITY)
Admission: RE | Disposition: A | Discharge status: Home / Self Care | Payer: Self-pay | Source: Home / Self Care | Attending: Neurosurgery

## 2022-09-05 ENCOUNTER — Other Ambulatory Visit: Payer: Self-pay

## 2022-09-05 ENCOUNTER — Inpatient Hospital Stay (HOSPITAL_COMMUNITY): Payer: Medicaid Other | Admitting: Anesthesiology

## 2022-09-05 ENCOUNTER — Inpatient Hospital Stay (HOSPITAL_COMMUNITY)
Admission: RE | Admit: 2022-09-05 | Discharge: 2022-09-06 | DRG: 027 | Disposition: A | Discharge status: Home / Self Care | Payer: Medicaid Other | Attending: Neurosurgery | Admitting: Neurosurgery

## 2022-09-05 ENCOUNTER — Encounter (HOSPITAL_COMMUNITY): Payer: Self-pay

## 2022-09-05 DIAGNOSIS — Z79899 Other long term (current) drug therapy: Secondary | ICD-10-CM | POA: Diagnosis not present

## 2022-09-05 DIAGNOSIS — R569 Unspecified convulsions: Secondary | ICD-10-CM | POA: Diagnosis present

## 2022-09-05 DIAGNOSIS — Z85841 Personal history of malignant neoplasm of brain: Secondary | ICD-10-CM | POA: Diagnosis not present

## 2022-09-05 DIAGNOSIS — C712 Malignant neoplasm of temporal lobe: Secondary | ICD-10-CM | POA: Diagnosis present

## 2022-09-05 DIAGNOSIS — C719 Malignant neoplasm of brain, unspecified: Principal | ICD-10-CM

## 2022-09-05 DIAGNOSIS — G939 Disorder of brain, unspecified: Secondary | ICD-10-CM | POA: Diagnosis present

## 2022-09-05 DIAGNOSIS — Z923 Personal history of irradiation: Secondary | ICD-10-CM

## 2022-09-05 HISTORY — PX: CRANIOTOMY: SHX93

## 2022-09-05 HISTORY — PX: APPLICATION OF CRANIAL NAVIGATION: SHX6578

## 2022-09-05 SURGERY — CRANIOTOMY TUMOR EXCISION
Anesthesia: General | Laterality: Left

## 2022-09-05 MED ORDER — THROMBIN 5000 UNITS EX SOLR
CUTANEOUS | Status: AC
  Filled 2022-09-05: qty 5000

## 2022-09-05 MED ORDER — FENTANYL CITRATE (PF) 100 MCG/2ML IJ SOLN
INTRAMUSCULAR | Status: AC
  Filled 2022-09-05: qty 2

## 2022-09-05 MED ORDER — THROMBIN 20000 UNITS EX SOLR
CUTANEOUS | Status: AC
  Filled 2022-09-05: qty 20000

## 2022-09-05 MED ORDER — HEMOSTATIC AGENTS (NO CHARGE) OPTIME
TOPICAL | Status: DC | PRN
  Administered 2022-09-05: 1 via TOPICAL

## 2022-09-05 MED ORDER — LABETALOL HCL 5 MG/ML IV SOLN
INTRAVENOUS | Status: DC | PRN
  Administered 2022-09-05 (×2): 10 mg via INTRAVENOUS

## 2022-09-05 MED ORDER — CHLORHEXIDINE GLUCONATE 0.12 % MT SOLN
15.0000 mL | Freq: Once | OROMUCOSAL | Status: AC
  Administered 2022-09-05: 15 mL via OROMUCOSAL
  Filled 2022-09-05: qty 15

## 2022-09-05 MED ORDER — ONDANSETRON HCL 4 MG/2ML IJ SOLN: 4.0000 mg | INTRAMUSCULAR | Status: DC | PRN

## 2022-09-05 MED ORDER — CHLORHEXIDINE GLUCONATE CLOTH 2 % EX PADS: 6.0000 | MEDICATED_PAD | Freq: Once | CUTANEOUS | Status: DC

## 2022-09-05 MED ORDER — ACETAMINOPHEN 10 MG/ML IV SOLN
INTRAVENOUS | Status: AC
  Administered 2022-09-05: 1000 mg via INTRAVENOUS
  Filled 2022-09-05: qty 100

## 2022-09-05 MED ORDER — ENALAPRILAT 1.25 MG/ML IV SOLN: 1.2500 mg | Freq: Four times a day (QID) | INTRAVENOUS | Status: DC | PRN

## 2022-09-05 MED ORDER — LIDOCAINE 2% (20 MG/ML) 5 ML SYRINGE
INTRAMUSCULAR | Status: AC
  Filled 2022-09-05: qty 5

## 2022-09-05 MED ORDER — DEXAMETHASONE SODIUM PHOSPHATE 10 MG/ML IJ SOLN
INTRAMUSCULAR | Status: AC
  Filled 2022-09-05: qty 1

## 2022-09-05 MED ORDER — PROPOFOL 10 MG/ML IV BOLUS
INTRAVENOUS | Status: DC | PRN
  Administered 2022-09-05: 150 mg via INTRAVENOUS

## 2022-09-05 MED ORDER — 0.9 % SODIUM CHLORIDE (POUR BTL) OPTIME
TOPICAL | Status: DC | PRN
  Administered 2022-09-05: 3000 mL

## 2022-09-05 MED ORDER — NICARDIPINE HCL IN NACL 20-0.86 MG/200ML-% IV SOLN: 3.0000 mg/h | INTRAVENOUS | Status: DC

## 2022-09-05 MED ORDER — HYDROMORPHONE HCL 1 MG/ML IJ SOLN
INTRAMUSCULAR | Status: DC | PRN
  Administered 2022-09-05 (×2): .5 mg via INTRAVENOUS

## 2022-09-05 MED ORDER — DEXAMETHASONE 0.5 MG PO TABS
2.0000 mg | ORAL_TABLET | Freq: Three times a day (TID) | ORAL | Status: DC
  Administered 2022-09-05 – 2022-09-06 (×4): 2 mg via ORAL
  Filled 2022-09-05 (×4): qty 4

## 2022-09-05 MED ORDER — PANTOPRAZOLE SODIUM 40 MG PO TBEC
40.0000 mg | DELAYED_RELEASE_TABLET | Freq: Every day | ORAL | Status: DC
  Administered 2022-09-05 – 2022-09-06 (×2): 40 mg via ORAL
  Filled 2022-09-05 (×2): qty 1

## 2022-09-05 MED ORDER — SUGAMMADEX SODIUM 200 MG/2ML IV SOLN
INTRAVENOUS | Status: DC | PRN
  Administered 2022-09-05 (×2): 200 mg via INTRAVENOUS

## 2022-09-05 MED ORDER — LABETALOL HCL 5 MG/ML IV SOLN: 10.0000 mg | INTRAVENOUS | Status: DC | PRN

## 2022-09-05 MED ORDER — HYDROMORPHONE HCL 1 MG/ML IJ SOLN
INTRAMUSCULAR | Status: AC
  Filled 2022-09-05: qty 0.5

## 2022-09-05 MED ORDER — THROMBIN 5000 UNITS EX SOLR: OROMUCOSAL | Status: DC | PRN

## 2022-09-05 MED ORDER — FENTANYL CITRATE (PF) 250 MCG/5ML IJ SOLN
INTRAMUSCULAR | Status: DC | PRN
  Administered 2022-09-05: 100 ug via INTRAVENOUS
  Administered 2022-09-05: 150 ug via INTRAVENOUS

## 2022-09-05 MED ORDER — CEFAZOLIN SODIUM-DEXTROSE 2-4 GM/100ML-% IV SOLN
2.0000 g | INTRAVENOUS | Status: AC
  Administered 2022-09-05: 2 g via INTRAVENOUS
  Filled 2022-09-05: qty 100

## 2022-09-05 MED ORDER — ROCURONIUM BROMIDE 10 MG/ML (PF) SYRINGE
PREFILLED_SYRINGE | INTRAVENOUS | Status: DC | PRN
  Administered 2022-09-05: 40 mg via INTRAVENOUS
  Administered 2022-09-05: 50 mg via INTRAVENOUS
  Administered 2022-09-05: 60 mg via INTRAVENOUS

## 2022-09-05 MED ORDER — ADHERUS DURAL SEALANT
PACK | TOPICAL | Status: DC | PRN
  Administered 2022-09-05: 1

## 2022-09-05 MED ORDER — ADULT MULTIVITAMIN W/MINERALS CH
1.0000 | ORAL_TABLET | Freq: Every day | ORAL | Status: DC
  Administered 2022-09-06: 1 via ORAL
  Filled 2022-09-05: qty 1

## 2022-09-05 MED ORDER — HYDROCODONE-ACETAMINOPHEN 5-325 MG PO TABS: 1.0000 | ORAL_TABLET | ORAL | Status: DC | PRN

## 2022-09-05 MED ORDER — DEXAMETHASONE SODIUM PHOSPHATE 10 MG/ML IJ SOLN
INTRAMUSCULAR | Status: DC | PRN
  Administered 2022-09-05: 10 mg via INTRAVENOUS

## 2022-09-05 MED ORDER — FENTANYL CITRATE (PF) 100 MCG/2ML IJ SOLN
25.0000 ug | INTRAMUSCULAR | Status: DC | PRN
  Administered 2022-09-05: 25 ug via INTRAVENOUS

## 2022-09-05 MED ORDER — ROCURONIUM BROMIDE 10 MG/ML (PF) SYRINGE
PREFILLED_SYRINGE | INTRAVENOUS | Status: AC
  Filled 2022-09-05: qty 10

## 2022-09-05 MED ORDER — CHLORHEXIDINE GLUCONATE CLOTH 2 % EX PADS
6.0000 | MEDICATED_PAD | Freq: Every day | CUTANEOUS | Status: DC
  Administered 2022-09-05 – 2022-09-06 (×2): 6 via TOPICAL

## 2022-09-05 MED ORDER — MIDAZOLAM HCL 2 MG/2ML IJ SOLN
INTRAMUSCULAR | Status: AC
  Filled 2022-09-05: qty 2

## 2022-09-05 MED ORDER — SODIUM CHLORIDE 0.9 % IV SOLN: INTRAVENOUS | Status: DC

## 2022-09-05 MED ORDER — BACITRACIN ZINC 500 UNIT/GM EX OINT
TOPICAL_OINTMENT | CUTANEOUS | Status: AC
  Filled 2022-09-05: qty 28.35

## 2022-09-05 MED ORDER — POTASSIUM CHLORIDE IN NACL 20-0.9 MEQ/L-% IV SOLN: INTRAVENOUS | Status: DC

## 2022-09-05 MED ORDER — THROMBIN 20000 UNITS EX SOLR: CUTANEOUS | Status: DC | PRN

## 2022-09-05 MED ORDER — MORPHINE SULFATE (PF) 2 MG/ML IV SOLN: 1.0000 mg | INTRAVENOUS | Status: DC | PRN

## 2022-09-05 MED ORDER — POLYETHYLENE GLYCOL 3350 17 G PO PACK: 17.0000 g | PACK | Freq: Every day | ORAL | Status: DC | PRN

## 2022-09-05 MED ORDER — SODIUM CHLORIDE 0.9 % IV SOLN
750.0000 mg | INTRAVENOUS | Status: AC
  Administered 2022-09-05: 750 mg via INTRAVENOUS
  Filled 2022-09-05 (×3): qty 7.5

## 2022-09-05 MED ORDER — ONDANSETRON HCL 4 MG/2ML IJ SOLN
INTRAMUSCULAR | Status: AC
  Filled 2022-09-05: qty 2

## 2022-09-05 MED ORDER — ORAL CARE MOUTH RINSE: 15.0000 mL | OROMUCOSAL | Status: DC | PRN

## 2022-09-05 MED ORDER — FLEET ENEMA 7-19 GM/118ML RE ENEM: 1.0000 | ENEMA | Freq: Once | RECTAL | Status: DC | PRN

## 2022-09-05 MED ORDER — LIDOCAINE 2% (20 MG/ML) 5 ML SYRINGE
INTRAMUSCULAR | Status: DC | PRN
  Administered 2022-09-05: 60 mg via INTRAVENOUS

## 2022-09-05 MED ORDER — ACETAMINOPHEN 325 MG PO TABS: 650.0000 mg | ORAL_TABLET | ORAL | Status: DC | PRN

## 2022-09-05 MED ORDER — LIDOCAINE-EPINEPHRINE 1 %-1:100000 IJ SOLN
INTRAMUSCULAR | Status: AC
  Filled 2022-09-05: qty 1

## 2022-09-05 MED ORDER — ESMOLOL HCL 100 MG/10ML IV SOLN
INTRAVENOUS | Status: DC | PRN
  Administered 2022-09-05 (×2): 20 mg via INTRAVENOUS
  Administered 2022-09-05: 30 mg via INTRAVENOUS
  Administered 2022-09-05: 10 mg via INTRAVENOUS

## 2022-09-05 MED ORDER — LIDOCAINE-EPINEPHRINE 1 %-1:100000 IJ SOLN
INTRAMUSCULAR | Status: DC | PRN
  Administered 2022-09-05: 6 mL

## 2022-09-05 MED ORDER — ONDANSETRON HCL 4 MG PO TABS: 4.0000 mg | ORAL_TABLET | ORAL | Status: DC | PRN

## 2022-09-05 MED ORDER — PROPOFOL 10 MG/ML IV BOLUS
INTRAVENOUS | Status: AC
  Filled 2022-09-05: qty 20

## 2022-09-05 MED ORDER — MIDAZOLAM HCL 2 MG/2ML IJ SOLN
INTRAMUSCULAR | Status: DC | PRN
  Administered 2022-09-05: 2 mg via INTRAVENOUS

## 2022-09-05 MED ORDER — ONDANSETRON HCL 4 MG/2ML IJ SOLN
INTRAMUSCULAR | Status: DC | PRN
  Administered 2022-09-05: 4 mg via INTRAVENOUS

## 2022-09-05 MED ORDER — ACETAMINOPHEN 650 MG RE SUPP: 650.0000 mg | RECTAL | Status: DC | PRN

## 2022-09-05 MED ORDER — LEVETIRACETAM 750 MG PO TABS
750.0000 mg | ORAL_TABLET | Freq: Two times a day (BID) | ORAL | Status: DC
  Administered 2022-09-05 – 2022-09-06 (×2): 750 mg via ORAL
  Filled 2022-09-05 (×2): qty 1

## 2022-09-05 MED ORDER — PROMETHAZINE HCL 25 MG PO TABS: 12.5000 mg | ORAL_TABLET | ORAL | Status: DC | PRN

## 2022-09-05 MED ORDER — FENTANYL CITRATE (PF) 250 MCG/5ML IJ SOLN
INTRAMUSCULAR | Status: AC
  Filled 2022-09-05: qty 5

## 2022-09-05 MED ORDER — CEFAZOLIN SODIUM-DEXTROSE 1-4 GM/50ML-% IV SOLN
1.0000 g | Freq: Three times a day (TID) | INTRAVENOUS | Status: AC
  Administered 2022-09-05 – 2022-09-06 (×3): 1 g via INTRAVENOUS
  Filled 2022-09-05 (×3): qty 50

## 2022-09-05 MED ORDER — PHENYLEPHRINE 80 MCG/ML (10ML) SYRINGE FOR IV PUSH (FOR BLOOD PRESSURE SUPPORT)
PREFILLED_SYRINGE | INTRAVENOUS | Status: DC | PRN
  Administered 2022-09-05: 200 ug via INTRAVENOUS
  Administered 2022-09-05 (×2): 80 ug via INTRAVENOUS

## 2022-09-05 MED ORDER — BACITRACIN ZINC 500 UNIT/GM EX OINT
TOPICAL_OINTMENT | CUTANEOUS | Status: DC | PRN
  Administered 2022-09-05: 1 via TOPICAL

## 2022-09-05 MED ORDER — ACETAMINOPHEN 10 MG/ML IV SOLN: 1000.0000 mg | Freq: Once | INTRAVENOUS | Status: DC | PRN

## 2022-09-05 MED ORDER — DOCUSATE SODIUM 100 MG PO CAPS
100.0000 mg | ORAL_CAPSULE | Freq: Two times a day (BID) | ORAL | Status: DC
  Filled 2022-09-05: qty 1

## 2022-09-05 MED ORDER — PHENYLEPHRINE HCL-NACL 20-0.9 MG/250ML-% IV SOLN
INTRAVENOUS | Status: DC | PRN
  Administered 2022-09-05: 25 ug/min via INTRAVENOUS

## 2022-09-05 MED ORDER — ORAL CARE MOUTH RINSE: 15.0000 mL | Freq: Once | OROMUCOSAL | Status: AC

## 2022-09-05 SURGICAL SUPPLY — 90 items
BAG COUNTER SPONGE SURGICOUNT (BAG) ×1 IMPLANT
BAND RUBBER #18 3X1/16 STRL (MISCELLANEOUS) IMPLANT
BLADE CLIPPER SURG (BLADE) ×1 IMPLANT
BLADE SURG 11 STRL SS (BLADE) ×1 IMPLANT
BUR ACORN 9.0 PRECISION (BURR) ×1 IMPLANT
BUR ROUND FLUTED 4 SOFT TCH (BURR) IMPLANT
BUR SPIRAL ROUTER 2.3 (BUR) ×1 IMPLANT
CANISTER SUCT 3000ML PPV (MISCELLANEOUS) ×2 IMPLANT
CATH VENTRIC 35X38 W/TROCAR LG (CATHETERS) IMPLANT
CLIP VESOCCLUDE MED 6/CT (CLIP) IMPLANT
CNTNR URN SCR LID CUP LEK RST (MISCELLANEOUS) ×1 IMPLANT
CONT SPEC 4OZ STRL OR WHT (MISCELLANEOUS) ×1
COVERAGE SUPPORT O-ARM STEALTH (MISCELLANEOUS) ×1 IMPLANT
DRAIN SUBARACHNOID (WOUND CARE) IMPLANT
DRAPE 3/4 80X56 (DRAPES) ×1 IMPLANT
DRAPE HALF SHEET 40X57 (DRAPES) ×1 IMPLANT
DRAPE MICROSCOPE SLANT 54X150 (MISCELLANEOUS) IMPLANT
DRAPE NEUROLOGICAL W/INCISE (DRAPES) ×1 IMPLANT
DRAPE SURG 17X23 STRL (DRAPES) IMPLANT
DRAPE WARM FLUID 44X44 (DRAPES) ×1 IMPLANT
DRSG ADAPTIC 3X8 NADH LF (GAUZE/BANDAGES/DRESSINGS) IMPLANT
DRSG AQUACEL AG ADV 3.5X 6 (GAUZE/BANDAGES/DRESSINGS) IMPLANT
DRSG AQUACEL AG ADV 3.5X10 (GAUZE/BANDAGES/DRESSINGS) IMPLANT
DRSG TELFA 3X8 NADH STRL (GAUZE/BANDAGES/DRESSINGS) IMPLANT
DURAPREP 6ML APPLICATOR 50/CS (WOUND CARE) ×1 IMPLANT
ELECT COATED BLADE 2.86 ST (ELECTRODE) ×1 IMPLANT
ELECT REM PT RETURN 9FT ADLT (ELECTROSURGICAL) ×1
ELECTRODE REM PT RTRN 9FT ADLT (ELECTROSURGICAL) ×1 IMPLANT
EVACUATOR 1/8 PVC DRAIN (DRAIN) IMPLANT
EVACUATOR SILICONE 100CC (DRAIN) IMPLANT
FEE COVERAGE SUPPORT O-ARM (MISCELLANEOUS) ×1 IMPLANT
FORCEPS BIPO MALIS IRRIG 9X1.5 (NEUROSURGERY SUPPLIES) ×1 IMPLANT
GAUZE 4X4 16PLY ~~LOC~~+RFID DBL (SPONGE) IMPLANT
GAUZE SPONGE 4X4 12PLY STRL (GAUZE/BANDAGES/DRESSINGS) IMPLANT
GAUZE XEROFORM 1X8 LF (GAUZE/BANDAGES/DRESSINGS) IMPLANT
GLOVE BIO SURGEON STRL SZ7.5 (GLOVE) ×1 IMPLANT
GLOVE BIOGEL PI IND STRL 7.5 (GLOVE) ×1 IMPLANT
GLOVE EXAM NITRILE LRG STRL (GLOVE) IMPLANT
GLOVE EXAM NITRILE XL STR (GLOVE) IMPLANT
GLOVE SURG ENC MOIS LTX SZ8 (GLOVE) ×1 IMPLANT
GOWN STRL REUS W/ TWL LRG LVL3 (GOWN DISPOSABLE) ×2 IMPLANT
GOWN STRL REUS W/ TWL XL LVL3 (GOWN DISPOSABLE) ×1 IMPLANT
GOWN STRL REUS W/TWL 2XL LVL3 (GOWN DISPOSABLE) IMPLANT
GOWN STRL REUS W/TWL LRG LVL3 (GOWN DISPOSABLE) ×2
GOWN STRL REUS W/TWL XL LVL3 (GOWN DISPOSABLE) ×1
GRAFT DURAGEN MATRIX 3WX3L (Graft) ×1 IMPLANT
GRAFT DURAGEN MATRIX 3X3 SNGL (Graft) IMPLANT
HEMOSTAT POWDER KIT SURGIFOAM (HEMOSTASIS) ×1 IMPLANT
HEMOSTAT SURGICEL 2X14 (HEMOSTASIS) IMPLANT
HEMOSTAT SURGICEL 2X4 FIBR (HEMOSTASIS) IMPLANT
HOOK DURA 1/2IN (MISCELLANEOUS) ×1 IMPLANT
HOOK RETRACTION 12 ELAST STAY (MISCELLANEOUS) IMPLANT
IV NS 1000ML (IV SOLUTION) ×1
IV NS 1000ML BAXH (IV SOLUTION) ×1 IMPLANT
KIT BASIN OR (CUSTOM PROCEDURE TRAY) ×1 IMPLANT
KIT DRAIN CSF ACCUDRAIN (MISCELLANEOUS) IMPLANT
KIT TURNOVER KIT B (KITS) ×1 IMPLANT
MARKER SPHERE PSV REFLC NDI (MISCELLANEOUS) ×3 IMPLANT
NDL SPNL 18GX3.5 QUINCKE PK (NEEDLE) IMPLANT
NEEDLE HYPO 22GX1.5 SAFETY (NEEDLE) ×1 IMPLANT
NEEDLE SPNL 18GX3.5 QUINCKE PK (NEEDLE) IMPLANT
NS IRRIG 1000ML POUR BTL (IV SOLUTION) ×3 IMPLANT
PACK CRANIOTOMY CUSTOM (CUSTOM PROCEDURE TRAY) ×1 IMPLANT
PATTIES SURGICAL .25X.25 (GAUZE/BANDAGES/DRESSINGS) IMPLANT
PATTIES SURGICAL .5 X.5 (GAUZE/BANDAGES/DRESSINGS) IMPLANT
PATTIES SURGICAL .5 X3 (DISPOSABLE) IMPLANT
PATTIES SURGICAL 1/4 X 3 (GAUZE/BANDAGES/DRESSINGS) IMPLANT
PATTIES SURGICAL 1X1 (DISPOSABLE) IMPLANT
PIN MAYFIELD SKULL DISP (PIN) ×1 IMPLANT
SCREW UNIII AXS SD 1.5X4 (Screw) IMPLANT
SEALANT ADHERUS EXTEND TIP (MISCELLANEOUS) IMPLANT
SET TUBING IRRIGATION DISP (TUBING) ×1 IMPLANT
SPIKE FLUID TRANSFER (MISCELLANEOUS) ×1 IMPLANT
SPONGE NEURO XRAY DETECT 1X3 (DISPOSABLE) IMPLANT
SPONGE SURGIFOAM ABS GEL 100 (HEMOSTASIS) ×1 IMPLANT
STAPLER VISISTAT 35W (STAPLE) ×1 IMPLANT
STOCKINETTE STERILE 6X72 (MISCELLANEOUS) IMPLANT
SUT ETHILON 3 0 FSL (SUTURE) IMPLANT
SUT ETHILON 3 0 PS 1 (SUTURE) IMPLANT
SUT MNCRL AB 3-0 PS2 18 (SUTURE) IMPLANT
SUT NURALON 4 0 TR CR/8 (SUTURE) ×3 IMPLANT
SUT SILK 0 TIES 10X30 (SUTURE) IMPLANT
SUT VIC AB 2-0 CP2 18 (SUTURE) ×1 IMPLANT
SUT VICRYL RAPIDE 3 0 (SUTURE) IMPLANT
TOWEL GREEN STERILE (TOWEL DISPOSABLE) ×1 IMPLANT
TOWEL GREEN STERILE FF (TOWEL DISPOSABLE) ×1 IMPLANT
TRAY FOLEY MTR SLVR 16FR STAT (SET/KITS/TRAYS/PACK) ×1 IMPLANT
TUBING FEATHERFLOW (TUBING) IMPLANT
UNDERPAD 30X36 HEAVY ABSORB (UNDERPADS AND DIAPERS) ×1 IMPLANT
WATER STERILE IRR 1000ML POUR (IV SOLUTION) ×1 IMPLANT

## 2022-09-05 NOTE — Transfer of Care (Signed)
Immediate Anesthesia Transfer of Care Note  Patient: Hunter Terry  Procedure(s) Performed: CRANIOTOMY FOR RESECTION OF TEMPORAL LOBE TUMOR (Left) APPLICATION OF CRANIAL NAVIGATION (Left)  Patient Location: PACU  Anesthesia Type:General  Level of Consciousness: drowsy and patient cooperative  Airway & Oxygen Therapy: Patient Spontanous Breathing  Post-op Assessment: Report given to RN and Post -op Vital signs reviewed and stable  Post vital signs: Reviewed and stable  Last Vitals:  Vitals Value Taken Time  BP 120/76 09/05/22 1147  Temp    Pulse 82 09/05/22 1151  Resp 12 09/05/22 1151  SpO2 96 % 09/05/22 1151  Vitals shown include unvalidated device data.  Last Pain:  Vitals:   09/05/22 0639  TempSrc:   PainSc: 0-No pain         Complications: No notable events documented.

## 2022-09-05 NOTE — Op Note (Signed)
Procedure(s): CRANIOTOMY FOR RESECTION OF TEMPORAL LOBE TUMOR APPLICATION OF CRANIAL NAVIGATION Procedure Note  Geovany Trudo adult 62 y.o. 09/05/2022  Procedure(s) and Anesthesia Type:    * CRANIOTOMY FOR RESECTION OF TEMPORAL LOBE TUMOR - General    * APPLICATION OF CRANIAL NAVIGATION - General  Surgeon(s) and Role:    [name], Dorcas Carrow, MD - Primary    * Dawley, Theodoro Doing, DO - Assisting   Indications:  This is a 62 year old man with left temporal glioblastoma status post polypectomy who developed an area suspicious of radiographic recurrence which was progressively enlarging on serial imaging.  After interdisciplinary discussion, surgical resection was recommended.  Risk, benefits, terms, and expected convalescence were discussed with him.  Risk discussed included, but were not limited to, bleeding, pain, infection, scar, seizure, stroke, neurologic deficit, recurrence, and death.  Informed consent was obtained.     Surgeon: [name]   Assistants: Elwin Sleight, DO.  Please note there were no qualified trainees available to assist with the procedure.  Dr. Reatha Armour provided assistance with the resection and wound closure.  Anesthesia: General endotracheal anesthesia  ASA Class: 2   Procedure Detail  Redo left CRANIOTOMY FOR RESECTION OF TEMPORAL LOBE TUMOR,  2. APPLICATION OF CRANIAL NAVIGATION 3.  Use of intraoperative microscope for microdissection   The patient was brought to the operating room.  A timeout was performed.  General anesthesia was induced and patient was intubated by the anesthesia service.  After appropriate lines and monitors were placed, patient's head was placed in a [place] head clamp and affixed to the bed.  Using a preoperative thin cut MRI, a 3D reconstruction of the patient's head was created and the surface match registration with the Farley system was performed.  This allowed for intraoperative neuronavigation.  The scalp was s  clipped, preprepped with alcohol and cleansing solution and prepped and draped in sterile fashion.  1% lidocaine with epinephrine injected into the planned incision.  Previous question mark shaped incision was reopened with a 10 blade and monopolar electrocautery was used to open the galea and incised the temporalis fascia.  The periosteum and temporalis muscle was dissected off the skull and the myocutaneous flap was flapped forward and held in place with fishhooks.  Previous cranial plate screws were removed and there was evidence of some healing across the bone flap so a high-speed drill with footplate was used to perform craniotomy.  Previous bur holes were used to dissect the dura from the inner table of the skull.  .  Meticulous epidural hemostasis obtained.  Neuronavigation was used to plan a customized dural opening for the enhancing area of recurrence in the superior temporal gyrus.  The dura was opened with 15 blade and micro dissection instruments were used to dissected the brain from the inner layer of the dura.  The dura was then flapped inferiorly and held in place with stitches.  The microscope was then introduced into the field to allow for intraoperative microdissection.  Abnormal grayish tissue with slight purple tinge was encountered and sent for frozen and permanent section.  Abnormal tissue was then removed down to the lateral portion of the temporal horn as well as to the large lobectomy cavity anteriorly and inferiorly.  Subfield resection was used to remove superior temporal gyrus to the sylvian fissure and posteriorly to the temporal stem.  When only gliotic and not hypervascular tissue remained, neuronavigation confirmed good resection of enhancing lesional tissue.  Meticulous hemostasis was obtained.  A piece of Surgicel was placed over a small opening in the temporal horn.  The dura was closed with 4-0 Nurolon in a running fashion with a DuraGen onlay placed on top of that.  The bone flap  was replaced with Stryker plating system.  The wound is irrigated thoroughly .  A medium Hemovac drain was placed in subgaleal space and tunneled the skin secured with a stitch.  Temporalis muscle was reapproximated 2-0 Vicryl stitches.  The galea was closed with 2-0 Vicryl sutures in buried fashion.  Skin was closed with staples.  Sterile dressing was then placed on the incision.  Patient was then extubated by the anesthesia service moving all extremities.  All counts were correct at the end of surgery.  No complications were noted.  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         Condition: stable

## 2022-09-05 NOTE — Anesthesia Postprocedure Evaluation (Signed)
Anesthesia Post Note  Patient: Hunter Terry  Procedure(s) Performed: CRANIOTOMY FOR RESECTION OF TEMPORAL LOBE TUMOR (Left) APPLICATION OF CRANIAL NAVIGATION (Left)     Patient location during evaluation: PACU Anesthesia Type: General Level of consciousness: awake and alert Pain management: pain level controlled Vital Signs Assessment: post-procedure vital signs reviewed and stable Respiratory status: spontaneous breathing, nonlabored ventilation, respiratory function stable and patient connected to nasal cannula oxygen Cardiovascular status: blood pressure returned to baseline and stable Postop Assessment: no apparent nausea or vomiting Anesthetic complications: no   No notable events documented.  Last Vitals:  Vitals:   09/05/22 1415 09/05/22 1500  BP: 128/84 122/81  Pulse:  77  Resp: 13 (!) 22  Temp:    SpO2:  99%    Last Pain:  Vitals:   09/05/22 1315  TempSrc:   PainSc: Asleep                 Belenda Cruise P [name]

## 2022-09-05 NOTE — Anesthesia Procedure Notes (Signed)
Procedure Name: Intubation Date/Time: 09/05/2022 8:18 AM  Performed by: Lance Coon, CRNAPre-anesthesia Checklist: Patient identified, Suction available, Emergency Drugs available, Patient being monitored and Timeout performed Patient Re-evaluated:Patient Re-evaluated prior to induction Oxygen Delivery Method: Circle system utilized Preoxygenation: Pre-oxygenation with 100% oxygen Induction Type: IV induction Ventilation: Mask ventilation without difficulty Laryngoscope Size: Miller and 3 Grade View: Grade II Tube type: Oral Tube size: 7.5 mm Number of attempts: 1 Airway Equipment and Method: Stylet Placement Confirmation: ETT inserted through vocal cords under direct vision, positive ETCO2 and breath sounds checked- equal and bilateral Secured at: 20 cm Tube secured with: Tape Dental Injury: Teeth and Oropharynx as per pre-operative assessment

## 2022-09-05 NOTE — Anesthesia Procedure Notes (Signed)
Arterial Line Insertion Start/End1/25/2024 7:05 AM, 09/05/2022 7:15 AM Performed by: Lance Coon, CRNA, CRNA  Patient location: OR. Preanesthetic checklist: patient identified, IV checked, site marked, risks and benefits discussed, surgical consent, monitors and equipment checked, pre-op evaluation, timeout performed and anesthesia consent Lidocaine 1% used for infiltration Right, radial was placed Catheter size: 20 G Hand hygiene performed  and maximum sterile barriers used   Attempts: 1 Procedure performed without using ultrasound guided technique. Following insertion, dressing applied and Biopatch. Post procedure assessment: normal and unchanged  Patient tolerated the procedure well with no immediate complications.

## 2022-09-05 NOTE — H&P (Signed)
CC: Brain lesion  HPI:     Patient is a 62 y.o. adult  with hx of seizures, left temporal GBM s/p craniotomy 2 years ago and s/p XRT, temodar, avastin developed radiographic changes concerning for progression.  After interdisciplinary discussion, craniotomy for resection was recommended.  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   Focal seizures ([place]) 07/24/2020   Hyponatremia 06/10/2020   Past Medical History:  Diagnosis Date   Cancer ([place])    BRAIN TUMOR   Headache    Seizure [facility])     Past Surgical History:  Procedure Laterality Date   APPLICATION OF CRANIAL NAVIGATION N/A 06/15/2020   Procedure: APPLICATION OF CRANIAL NAVIGATION;  Surgeon: [name], MD;  Location: [place];  Service: Neurosurgery;  Laterality: N/A;   APPLICATION OF CRANIAL NAVIGATION N/A 08/02/2020   Procedure: APPLICATION OF CRANIAL NAVIGATION;  Surgeon: [name], MD;  Location: [place];  Service: Neurosurgery;  Laterality: N/A;   CRANIOTOMY N/A 08/02/2020   Procedure: CRANIOTOMY LEFT TEMPORAL LOBECTOMY FOR TUMOR EXCISION;  Surgeon: [name], MD;  Location: [place];  Service: Neurosurgery;  Laterality: N/A;   FRAMELESS  BIOPSY WITH BRAINLAB Left 06/15/2020   Procedure: Left temporal lobe stereotactic brain biopsy with brainlab;  Surgeon: [name], MD;  Location: [place];  Service: Neurosurgery;  Laterality: Left;    Medications Prior to Admission  Medication Sig Dispense Refill Last Dose   acetaminophen (TYLENOL) 500 MG tablet Take 500 mg by mouth every 6 (six) hours as needed for moderate pain.   Past Week   levETIRAcetam (KEPPRA) 750 MG tablet Take 1 tablet (750 mg total) by mouth 2 (two) times daily. 60 tablet 3 Past Week   Multiple Vitamin (MULTIVITAMIN WITH MINERALS) TABS tablet Take 1 tablet by mouth daily.   Past Week   No Known Allergies  Social History   Tobacco Use   Smoking  status: Never   Smokeless tobacco: Never  Substance Use Topics   Alcohol use: Yes    Comment: 1 beer a month    Family History  Problem Relation Age of Onset   Cancer Neg Hx      Review of Systems See HPI  Objective:   Patient Vitals for the past 8 hrs:  BP Temp Temp src Pulse Resp SpO2 Height Weight  09/05/22 0546 130/80 98.2 F (36.8 C) Oral 69 18 98 % '5\' 6"'$  (1.676 m) 68 kg   No intake/output data recorded. No intake/output data recorded.    General : Alert, cooperative, no distress, appears stated age   Head:  Normocephalic/atraumatic. Well healed incision   Eyes: PERRL, conjunctiva/corneas clear, EOM's intact. Fundi could not be visualized Neck: Supple Chest:  Respirations unlabored Chest wall: no tenderness or deformity Heart: Regular rate and rhythm Abdomen: Soft, nontender and nondistended Extremities: warm and well-perfused Skin: normal turgor, color and texture Neurologic:  Alert, oriented x 3.  Eyes open spontaneously. PERRL, EOMI, VFC, no facial droop. V1-3 intact.  No dysarthria, tongue protrusion symmetric.  CNII-XII intact. Normal strength, sensation and reflexes throughout.  No pronator drift, full strength in legs       Data Review  See clinic note for detail  Assessment:   Active Problems:   * No active hospital problems. 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  Informed consent obtained.

## 2022-09-06 ENCOUNTER — Inpatient Hospital Stay (HOSPITAL_COMMUNITY): Payer: Medicaid Other

## 2022-09-06 ENCOUNTER — Encounter (HOSPITAL_COMMUNITY): Payer: Self-pay | Admitting: Neurosurgery

## 2022-09-06 LAB — CBC WITH DIFFERENTIAL/PLATELET
Abs Immature Granulocytes: 0.04 10*3/uL (ref 0.00–0.07)
Basophils Absolute: 0 10*3/uL (ref 0.0–0.1)
Basophils Relative: 0 %
Eosinophils Absolute: 0 10*3/uL (ref 0.0–0.5)
Eosinophils Relative: 0 %
HCT: 36 % — ABNORMAL LOW (ref 39.0–52.0)
Hemoglobin: 12.8 g/dL — ABNORMAL LOW (ref 13.0–17.0)
Immature Granulocytes: 1 %
Lymphocytes Relative: 6 %
Lymphs Abs: 0.5 10*3/uL — ABNORMAL LOW (ref 0.7–4.0)
MCH: 31.9 pg (ref 26.0–34.0)
MCHC: 35.6 g/dL (ref 30.0–36.0)
MCV: 89.8 fL (ref 80.0–100.0)
Monocytes Absolute: 0.3 10*3/uL (ref 0.1–1.0)
Monocytes Relative: 4 %
Neutro Abs: 6.6 10*3/uL (ref 1.7–7.7)
Neutrophils Relative %: 89 %
Platelets: 194 10*3/uL (ref 150–400)
RBC: 4.01 MIL/uL — ABNORMAL LOW (ref 4.22–5.81)
RDW: 13 % (ref 11.5–15.5)
WBC: 7.4 10*3/uL (ref 4.0–10.5)
nRBC: 0 % (ref 0.0–0.2)

## 2022-09-06 LAB — BASIC METABOLIC PANEL
Anion gap: 6 (ref 5–15)
BUN: 9 mg/dL (ref 8–23)
CO2: 25 mmol/L (ref 22–32)
Calcium: 8.6 mg/dL — ABNORMAL LOW (ref 8.9–10.3)
Chloride: 103 mmol/L (ref 98–111)
Creatinine, Ser: 0.73 mg/dL (ref 0.61–1.24)
GFR, Estimated: >60 mL/min (ref >60)
Glucose, Bld: 141 mg/dL — ABNORMAL HIGH (ref 70–99)
Potassium: 4.1 mmol/L (ref 3.5–5.1)
Sodium: 134 mmol/L — ABNORMAL LOW (ref 135–145)

## 2022-09-06 LAB — SURGICAL PATHOLOGY

## 2022-09-06 MED ORDER — GADOBUTROL 1 MMOL/ML IV SOLN
6.0000 mL | Freq: Once | INTRAVENOUS | Status: AC | PRN
  Administered 2022-09-06: 6 mL via INTRAVENOUS

## 2022-09-06 MED FILL — Thrombin For Soln 5000 Unit: CUTANEOUS | Qty: 5000 | Status: AC

## 2022-09-06 NOTE — Discharge Instructions (Signed)
Remove dressing 1/28 Ok to shower with baby shampoo 1/28 Avoid strenuous activity

## 2022-09-06 NOTE — Progress Notes (Signed)
D/C education given to Pt and Pt's wife, all questions answered. No printed prescriptions to give or equipment to deliver. IV's removed. Pt taken to car by volunteers with all belongings.

## 2022-09-06 NOTE — Discharge Summary (Signed)
  Physician Discharge Summary  Patient ID: Hunter Terry MRN: [id number] DOB/AGE: February 24, 1961 62 y.o.  Admit date: 09/05/2022 Discharge date: 09/06/2022  Admission Diagnoses:  glioblastoma  Discharge Diagnoses:  Same Principal Problem:   GBM (glioblastoma multiforme) ([place]) Active Problems:   Glioblastoma [facility])   Discharged Condition: Stable  Hospital Course:  Hunter Terry is a 62 y.o. male with hx of GBM and left temporal lobectomy who developed area of progression in L STG concerning for recurrence.  He underwent elective resection and was admitted to the neuroICU postop.  POD#1, he was doing well with no pain, seizures and was mobilizing well.  MRI showed no untoward complications.  He was deemed ready for discharge home  Treatments: Surgery - left craniotomy for resection of temporal lobe mass  Discharge Exam: Blood pressure 119/63, pulse 88, temperature 98.2 F (36.8 C), temperature source Oral, resp. rate 16, height '5\' 6"'$  (1.676 m), weight 68 kg, SpO2 94 %. Awake, alert, oriented Speech fluent, appropriate CN grossly intact 5/5 BUE/BLE Wound c/d/i  Disposition: Discharge disposition: 01-Home or Self Care        Allergies as of 09/06/2022   No Known Allergies      Medication List     TAKE these medications    acetaminophen 500 MG tablet Commonly known as: TYLENOL Take 500 mg by mouth every 6 (six) hours as needed for moderate pain.   levETIRAcetam 750 MG tablet Commonly known as: KEPPRA Take 1 tablet (750 mg total) by mouth 2 (two) times daily.   multivitamin with minerals Tabs tablet Take 1 tablet by mouth daily.        Follow-up Information     [name], MD. Schedule an appointment as soon as possible for a visit.   Specialty: Neurosurgery Contact information: [address] [address] [phone]                 Signed: [name] 09/06/2022, 1:14 PM

## 2022-09-09 ENCOUNTER — Inpatient Hospital Stay: Payer: Medicaid Other | Attending: Internal Medicine

## 2022-09-11 ENCOUNTER — Other Ambulatory Visit: Payer: Self-pay

## 2022-09-19 ENCOUNTER — Telehealth: Payer: Self-pay | Admitting: Pharmacist

## 2022-09-19 ENCOUNTER — Other Ambulatory Visit (HOSPITAL_COMMUNITY): Payer: Self-pay

## 2022-09-19 ENCOUNTER — Telehealth: Payer: Self-pay | Admitting: Pharmacy Technician

## 2022-09-19 ENCOUNTER — Encounter: Payer: Self-pay | Admitting: Internal Medicine

## 2022-09-19 ENCOUNTER — Inpatient Hospital Stay: Payer: Medicaid Other | Attending: Internal Medicine | Admitting: Internal Medicine

## 2022-09-19 ENCOUNTER — Other Ambulatory Visit: Payer: Self-pay

## 2022-09-19 VITALS — BP 121/78 | HR 76 | Temp 98.2°F | Resp 16 | Wt 149.3 lb

## 2022-09-19 DIAGNOSIS — R569 Unspecified convulsions: Secondary | ICD-10-CM

## 2022-09-19 DIAGNOSIS — C712 Malignant neoplasm of temporal lobe: Secondary | ICD-10-CM | POA: Insufficient documentation

## 2022-09-19 DIAGNOSIS — Z79899 Other long term (current) drug therapy: Secondary | ICD-10-CM | POA: Diagnosis not present

## 2022-09-19 DIAGNOSIS — Z9221 Personal history of antineoplastic chemotherapy: Secondary | ICD-10-CM | POA: Diagnosis not present

## 2022-09-19 DIAGNOSIS — C719 Malignant neoplasm of brain, unspecified: Secondary | ICD-10-CM

## 2022-09-19 DIAGNOSIS — Z923 Personal history of irradiation: Secondary | ICD-10-CM | POA: Insufficient documentation

## 2022-09-19 MED ORDER — ONDANSETRON HCL 8 MG PO TABS
8.0000 mg | ORAL_TABLET | Freq: Three times a day (TID) | ORAL | 1 refills | Status: DC | PRN
  Filled 2022-09-19: qty 30, 10d supply, fill #0
  Filled 2022-10-25: qty 30, 10d supply, fill #1

## 2022-09-19 MED ORDER — TEMOZOLOMIDE 20 MG PO CAPS
80.0000 mg | ORAL_CAPSULE | Freq: Every day | ORAL | 0 refills | Status: AC
  Filled 2022-09-19 – 2022-09-20 (×3): qty 112, 28d supply, fill #0

## 2022-09-19 MED ORDER — LEVETIRACETAM 750 MG PO TABS: 750.0000 mg | ORAL_TABLET | Freq: Two times a day (BID) | ORAL | 3 refills | Status: DC

## 2022-09-19 NOTE — Progress Notes (Signed)
[facility] [place], [place] [address] [phone]   Interval Evaluation  Date of Service: 09/19/22 [name] Patient MRN: [id number] Patient DOB: 10/10/1960 Provider: Ventura Sellers, MD  Identifying Statement:  Hunter Terry is a 62 y.o. male with left temporal glioblastoma   Oncologic History: Oncology History  Glioblastoma, IDH-wildtype ([place])  06/15/2020 Surgery   Stereotactic biopsy, L temporal [name]).  Path demonstrates glioblastoma IDH-1 wt   08/02/2020 Surgery   Debulking craniotomy with Dr. [name]   09/05/2020 - 10/17/2020 Radiation Therapy   IMRT with concurrent Temozolomide [name])   11/19/2020 - 06/18/2021 Chemotherapy   Completes #7 cycles adjuvant 5-day Temozolomide       07/17/2021 Progression   Progression of disease #1   07/18/2021 - 07/10/2022 Chemotherapy   Completes 8 cycles of CCNU '90mg'$ /m2 PO q6 weeks and Avastin '10mg'$ /kg IV q2 weeks   07/11/2022 Progression   Progression of disease #2   09/05/2022 Surgery   Repeat left temporal craniotomy with Dr. [name]; path is Glioblastoma IDHwt   09/19/2022 -  Chemotherapy   Initiates metronomic Temozolomide, '50mg'$ /m2 PO daily     Biomarkers:  MGMT Unknown.  IDH 1/2 Wild type.  EGFR Unknown  TERT "Mutated   Interval History:  Hunter Terry presents today for follow up after completing craniotomy last month with Dr. [name].  Tolerated surgey well overall, no significant issues. No clinical changes today otherwise. No balance issues, motor dysfunction.  Continues on Keppra '750mg'$  BID, no further seizures.  H+P (07/24/20) Patient presented to medical attention in late October, 2021 with new onset seizure.  Event was decribed as loss of consciousness, sudden, without clarity on further details aside from altered mental status upon awakening.  CNS imaging demonsrated non-enhancing mass within left temporal lobe c/w likely glioma; he  underwent stereotactic biopsy with Dr. [name] on 06/15/20.  There was significant delay in finalizing path, explaining delay in follow up and evaluation.  He denies any seizures since discharge from hospital, on 4 anti-seizure drugs.  He does complain of dizziness and drowsiness with the medications, however.  Also describes impaired short term memory.  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    Medications: Current Outpatient Medications on File Prior to Visit  Medication Sig Dispense Refill   acetaminophen (TYLENOL) 500 MG tablet Take 500 mg by mouth every 6 (six) hours as needed for moderate pain.     levETIRAcetam (KEPPRA) 750 MG tablet Take 1 tablet (750 mg total) by mouth 2 (two) times daily. 60 tablet 3   Multiple Vitamin (MULTIVITAMIN WITH MINERALS) TABS tablet Take 1 tablet by mouth daily.     [DISCONTINUED] lacosamide (VIMPAT) 200 MG TABS tablet Take 0.5 tablets (100 mg total) by mouth 2 (two) times daily. 30 tablet 0   No current facility-administered medications on file prior to visit.    Allergies: No Known Allergies Past Medical History:  Past Medical History:  Diagnosis Date   Cancer ([place])    BRAIN TUMOR   Headache    Seizure [facility])    Past Surgical History:  Past Surgical History:  Procedure Laterality Date   APPLICATION OF CRANIAL NAVIGATION N/A 06/15/2020   Procedure: APPLICATION OF CRANIAL NAVIGATION;  Surgeon: [name], MD;  Location: [place];  Service: Neurosurgery;  Laterality: N/A;   APPLICATION OF CRANIAL NAVIGATION N/A 08/02/2020   Procedure: APPLICATION OF CRANIAL NAVIGATION;  Surgeon: [name], MD;  [facility];  Service: Neurosurgery;  Laterality: N/A;   APPLICATION OF CRANIAL NAVIGATION Left 09/05/2022   Procedure: APPLICATION OF CRANIAL NAVIGATION;  Surgeon: [name], MD;  Location: [place];  Service: Neurosurgery;  Laterality: Left;   CRANIOTOMY N/A 08/02/2020   Procedure: CRANIOTOMY LEFT TEMPORAL LOBECTOMY FOR TUMOR  EXCISION;  Surgeon: [name], MD;  Location: [place];  Service: Neurosurgery;  Laterality: N/A;   CRANIOTOMY Left 09/05/2022   Procedure: CRANIOTOMY FOR RESECTION OF TEMPORAL LOBE TUMOR;  Surgeon: [name], MD;  Location: [place];  Service: Neurosurgery;  Laterality: Left;   FRAMELESS  BIOPSY WITH BRAINLAB Left 06/15/2020   Procedure: Left temporal lobe stereotactic brain biopsy with brainlab;  Surgeon: [name], MD;  Location: [place];  Service: Neurosurgery;  Laterality: Left;   Social History:  Social History   Socioeconomic History   Marital status: Married    Spouse name: Not on file   Number of children: Not on file   Years of education: Not on file   Highest education level: Not on file  Occupational History   Not on file  Tobacco Use   Smoking status: Never   Smokeless tobacco: Never  Vaping Use   Vaping Use: Never used  Substance and Sexual Activity   Alcohol use: Yes    Comment: 1 beer a month   Drug use: Never   Sexual activity: Yes    Partners: Female  Other Topics Concern   Not on file  Social History Narrative   Not on file   Social Determinants of Health   Financial Resource Strain: Not on file  Food Insecurity: Not on file  Transportation Needs: Not on file  Physical Activity: Not on file  Stress: Not on file  Social Connections: Not on file  Intimate Partner Violence: Not on file   Family History:  Family History  Problem Relation Age of Onset   Cancer Neg Hx     Review of Systems: Constitutional: Doesn't report fevers, chills or abnormal weight loss Eyes: Doesn't report blurriness of vision Ears, nose, mouth, throat, and face: Doesn't report sore throat Respiratory: Doesn't report cough, dyspnea or wheezes Cardiovascular: Doesn't report palpitation, chest discomfort  Gastrointestinal:  Doesn't report nausea, constipation, diarrhea GU: Doesn't report incontinence Skin: Doesn't report skin rashes Neurological: Per  HPI Musculoskeletal: Doesn't report joint pain Behavioral/Psych: Doesn't report anxiety  Physical Exam: There were no vitals filed for this visit.   KPS: 90. General: Alert, cooperative, pleasant, in no acute distress Head: Normal EENT: No conjunctival injection or scleral icterus.  Lungs: Resp effort normal Cardiac: Regular rate Abdomen: Non-distended abdomen Skin: No rashes cyanosis or petechiae. Extremities: No clubbing or edema  Neurologic Exam: Mental Status: Awake, alert, attentive to examiner. Oriented to self and environment. Language is fluent with intact comprehension.  Cranial Nerves: Visual acuity is grossly normal. Visual fields are full. Extra-ocular movements intact. No ptosis. Face is symmetric Motor: Tone and bulk are normal. Power is full in both arms and legs. Reflexes are symmetric, no pathologic reflexes present.  Sensory: Intact to light touch Gait: Normal.   Labs: I have reviewed the data as listed    Component Value Date/Time   NA 134 (L) 09/06/2022 0522   K 4.1 09/06/2022 0522   CL 103 09/06/2022 0522   CO2 25 09/06/2022 0522   GLUCOSE 141 (H) 09/06/2022 0522   BUN 9 09/06/2022 0522   CREATININE 0.73 09/06/2022 0522   CREATININE 0.85 08/01/2022 1027  CALCIUM 8.6 (L) 09/06/2022 0522   PROT 7.4 08/01/2022 1027   ALBUMIN 4.5 08/01/2022 1027   AST 86 (H) 08/01/2022 1027   ALT 133 (H) 08/01/2022 1027   ALKPHOS 322 (H) 08/01/2022 1027   BILITOT 0.6 08/01/2022 1027   GFRNONAA >60 09/06/2022 0522   GFRNONAA >60 08/01/2022 1027   Lab Results  Component Value Date   WBC 7.4 09/06/2022   NEUTROABS 6.6 09/06/2022   HGB 12.8 (L) 09/06/2022   HCT 36.0 (L) 09/06/2022   MCV 89.8 09/06/2022   PLT 194 09/06/2022    Imaging:  [place] Clinician Interpretation: I have personally reviewed the CNS images as listed.  My interpretation, in the context of the patient's clinical presentation, is progressive disease  MR BRAIN W WO CONTRAST  Result Date:  09/06/2022 CLINICAL DATA:  History of left temporal GBM status post craniotomy 2 years ago, chemoradiation, with concern for progression MRI. Now status post repeat craniotomy and resection. EXAM: MRI HEAD WITHOUT AND WITH CONTRAST TECHNIQUE: Multiplanar, multiecho pulse sequences of the brain and surrounding structures were obtained without and with intravenous contrast. CONTRAST:  91m GADAVIST GADOBUTROL 1 MMOL/ML IV SOLN COMPARISON:  Brain MRI most recently 08/10/2022 FINDINGS: Brain: The patient is status post repeat left temporal craniotomy for mass resection. There is expected postoperative fluid, air, and blood products at the resection site which measures approximately 2.9 cm x 2.1 cm (5-13). There is no significant diffusion restriction in the parenchyma surrounding the resection site. There are curvilinear intrinsically T1 hyperintense blood products at the periphery of the resection cavity (8-21, 8-18). There is a minimal if any enhancement at the periphery of the resection site. There is trace blood and air in the left lateral ventricle (7-49, 7-61). FLAIR signal abnormality in the left temporal lobe surrounding the resection cavity is overall increased compared to the most recent prior study with more confluent signal abnormality in the perisylvian region (6-19) and slightly increased extent in the peritrigonal white matter (6-23). There is no evidence of acute infarct. There is no mass effect or midline shift. The ventricles are normal in size. The postoperative fluid collection in the left middle cranial fossa anterior to the temporal lobe is unchanged. The small foci of nonspecific FLAIR signal abnormality in the right centrum semiovale and adjacent to the lentiform nucleus are unchanged. Vascular: Normal flow voids. Skull and upper cervical spine: Postsurgical changes reflecting left craniotomy are again noted. There is no suspicious marrow signal abnormality. Sinuses/Orbits: The paranasal sinuses  are clear. The globes and orbits are unremarkable. Other: None. IMPRESSION: 1. Baseline postoperative study following repeat left temporal GBM resection with no complicating feature or significant enhancement at the resection site. 2. Surrounding FLAIR signal abnormality has slightly increased since the prior study from 07/31/2022, possibly reflecting postoperative edema. Recommend attention on follow-up. 3. Trace blood and air in the left lateral ventricle without hydrocephalus. Electronically Signed   By: PValetta MoleM.D.   On: 09/06/2022 10:43     Assessment/Plan Glioblastoma, IDH-wildtype ([place] [C71.9]  Hunter RSelleyis clinically stable today, now having completed re-resection of progressive left temporal glioblastoma.  No new or progressive deficits.  He is fully recovered from surgery.  We will plan to follow with metronomic TMZ post-craniotomy, as discussed previously.  We recommended initiating treatment with Temozolomide '50mg'$ /m2 given daily. The patient will have a complete blood count performed on days 21 and 28 of each cycle, and a comprehensive metabolic panel performed on day 28 of each cycle. Labs  may need to be performed more often. Zofran will prescribed for home use for nausea/vomiting.   Informed consent was obtained verbally at bedside to proceed with oral chemotherapy.  Chemotherapy should be held for the following:  ANC less than 1,000  Platelets less than 100,000  LFT or creatinine greater than 2x ULN  If clinical concerns/contraindications develop  Keppra will stay at '750mg'$  BID.  We appreciate the opportunity to participate in the care of Hunter Terry.    We ask that Hunter Terry return to clinic in 1 months with labs for review prior to cycle #2, or sooner as needed.  All questions were answered. The patient knows to call the clinic with any problems, questions or concerns. No barriers to learning were detected.  The total time spent in the encounter was  30 minutes and more than 50% was on counseling and review of test results   Ventura Sellers, MD Medical Director of Neuro-Oncology [facility] [place] 09/19/22 11:58 AM

## 2022-09-19 NOTE — Progress Notes (Signed)
DISCONTINUE ON PATHWAY REGIMEN - Neuro     A cycle is every 42 days:     Lomustine   **Always confirm dose/schedule in your pharmacy ordering system**  REASON: Disease Progression PRIOR TREATMENT: [id number]: Lomustine 110 mg/m2 D1 q42 Days TREATMENT RESPONSE: Progressive Disease (PD)  START ON PATHWAY REGIMEN - Neuro     A cycle is every 28 days:     Temozolomide      Temozolomide   **Always confirm dose/schedule in your pharmacy ordering system**  Patient Characteristics: Glioma, Glioblastoma, IDH-wildtype, Recurrent or Progressive, Nonsurgical Candidate, Systemic Therapy Candidate, BRAF V600E Mutation Negative/Unknown and NTRK Fusion Negative/Unknown Disease Classification: Glioma Disease Classification: Glioblastoma, IDH-wildtype Disease Status: Recurrent or Progressive Treatment Classification: Nonsurgical Candidate Treatment (Nonsurgical/Adjuvant): Systemic Therapy Candidate NTRK Gene Fusion Status: Negative BRAF V600E Mutation Status: Negative Intent of Therapy: Non-Curative / Palliative Intent, Discussed with Patient 

## 2022-09-19 NOTE — Telephone Encounter (Signed)
Oral Oncology Pharmacist Encounter  Received new prescription for Temodar (temozolomide) for the treatment of glioblastoma, planned duration until disease progression or unacceptable drug toxicity.  CBC w/ Diff and BMP from 09/06/22 assessed, no baseline dose adjustment required. Prescription dose and frequency assessed for appropriateness. Appropriate for therapy initiation.   Current medication list in Epic reviewed, no relevant/significant DDIs with Temodar identified.  Evaluated chart and no patient barriers to medication adherence noted.   Patient agreement for treatment documented in MD note on 09/19/22.  Prescription has been e-scribed to the [facility] for benefits analysis and approval.  Oral Oncology Clinic will continue to follow for insurance authorization, copayment issues, initial counseling and start date.  Leron Croak, PharmD, BCPS, BCOP Hematology/Oncology Clinical Pharmacist [facility] [place] [phone] 09/19/2022 1:16 PM

## 2022-09-19 NOTE — Telephone Encounter (Signed)
Oral Oncology Patient Advocate Encounter   Was successful in securing patient an $1,700 grant from Patient [facility]) to provide copayment coverage for Temozolomide.  This will keep the out of pocket expense at $0.     I have spoken with the patient.    The billing information is as follows and has been shared with [place].   Member ID: [id number] Group ID: [id number] RxBin: [id number] Dates of Eligibility: 09/19/22 through 09/19/23  Fund:  [place], [place] Patient [place] Direct Number: [phone]  Fax: [phone]

## 2022-09-19 NOTE — Telephone Encounter (Signed)
Oral Oncology Patient Advocate Encounter  After completing a benefits investigation, prior authorization for Temozolomide is not required at this time through [facility] [employer].  Patient's copay is $4.     [name], CPhT-Adv Oncology Pharmacy Patient [place] Direct Number: [phone]  Fax: [phone]

## 2022-09-20 ENCOUNTER — Other Ambulatory Visit: Payer: Self-pay

## 2022-09-20 ENCOUNTER — Other Ambulatory Visit (HOSPITAL_COMMUNITY): Payer: Self-pay

## 2022-09-23 ENCOUNTER — Other Ambulatory Visit (HOSPITAL_COMMUNITY): Payer: Self-pay

## 2022-09-23 NOTE — Telephone Encounter (Addendum)
Oral Chemotherapy Pharmacy Student Encounter  Interpreter used [phone]): [place] [phone])  Patient Education I spoke with patient for overview of new oral chemotherapy medication: Temodar (temozolomide) for the treatment of glioblastoma, planned duration until disease progression or unacceptable toxicity.   Expected start date: 09/23/2022  Pt is doing well. Counseled patient on administration, dosing, side effects, monitoring, drug-food interactions, safe handling, and disposal. Patient will take temozolomide 20 mg 4 capsules (80 mg) by mouth once daily on an empty stomach at least 2 hours after dinner. Discussed with patient and his wife that he will be taking 4 capsules at once and that an 80 mg single capsule is not available; Mr. and Mrs. Kanhai voiced their understanding of this fact.  Side effects include but not limited to:  Nausea (use of Zofran reviewed -- if experiencing nausea after a dose, take a dose of Zofran 30 to 60 minutes prior to next dose of temozolomide) Headache (use of Tylenol reviewed) Rash (let Dr. Renda Rolls office know if this develops) Decreased blood counts (periodic labs)   Reviewed with patient importance of keeping a medication schedule and plan for any missed doses.  After discussion with patient, no patient barriers to medication adherence identified.   Mr. Lojewski voiced understanding and appreciation. All questions answered. Medication handout provided.  Provided patient with [facility] phone number. Patient knows to call the office with questions or concerns.  [employer] PharmD Candidate [id number] [phone] 09/23/2022 11:02 AM

## 2022-10-02 ENCOUNTER — Other Ambulatory Visit (HOSPITAL_COMMUNITY): Payer: Self-pay

## 2022-10-08 ENCOUNTER — Telehealth: Payer: Self-pay | Admitting: Internal Medicine

## 2022-10-08 NOTE — Telephone Encounter (Signed)
Called patient regarding upcoming March appointments, patient is notified.

## 2022-10-09 ENCOUNTER — Other Ambulatory Visit (HOSPITAL_COMMUNITY): Payer: Self-pay

## 2022-10-10 ENCOUNTER — Other Ambulatory Visit (HOSPITAL_COMMUNITY): Payer: Self-pay

## 2022-10-16 ENCOUNTER — Other Ambulatory Visit (HOSPITAL_COMMUNITY): Payer: Self-pay

## 2022-10-22 ENCOUNTER — Other Ambulatory Visit: Payer: Self-pay | Admitting: Internal Medicine

## 2022-10-24 ENCOUNTER — Other Ambulatory Visit: Payer: Self-pay

## 2022-10-24 ENCOUNTER — Inpatient Hospital Stay (HOSPITAL_BASED_OUTPATIENT_CLINIC_OR_DEPARTMENT_OTHER): Payer: Medicaid Other | Admitting: Internal Medicine

## 2022-10-24 ENCOUNTER — Other Ambulatory Visit (HOSPITAL_COMMUNITY): Payer: Self-pay

## 2022-10-24 ENCOUNTER — Inpatient Hospital Stay: Payer: Medicaid Other | Attending: Internal Medicine

## 2022-10-24 VITALS — BP 129/83 | HR 72 | Temp 98.1°F | Resp 16 | Wt 149.9 lb

## 2022-10-24 DIAGNOSIS — C719 Malignant neoplasm of brain, unspecified: Secondary | ICD-10-CM

## 2022-10-24 DIAGNOSIS — C712 Malignant neoplasm of temporal lobe: Secondary | ICD-10-CM | POA: Diagnosis present

## 2022-10-24 DIAGNOSIS — Z7952 Long term (current) use of systemic steroids: Secondary | ICD-10-CM | POA: Diagnosis not present

## 2022-10-24 DIAGNOSIS — R112 Nausea with vomiting, unspecified: Secondary | ICD-10-CM | POA: Diagnosis not present

## 2022-10-24 DIAGNOSIS — R569 Unspecified convulsions: Secondary | ICD-10-CM

## 2022-10-24 DIAGNOSIS — Z5111 Encounter for antineoplastic chemotherapy: Secondary | ICD-10-CM | POA: Diagnosis not present

## 2022-10-24 DIAGNOSIS — Z79630 Long term (current) use of alkylating agent: Secondary | ICD-10-CM | POA: Diagnosis not present

## 2022-10-24 DIAGNOSIS — Z9221 Personal history of antineoplastic chemotherapy: Secondary | ICD-10-CM | POA: Insufficient documentation

## 2022-10-24 DIAGNOSIS — Z923 Personal history of irradiation: Secondary | ICD-10-CM | POA: Insufficient documentation

## 2022-10-24 DIAGNOSIS — R42 Dizziness and giddiness: Secondary | ICD-10-CM | POA: Diagnosis not present

## 2022-10-24 DIAGNOSIS — R400 Somnolence: Secondary | ICD-10-CM | POA: Diagnosis not present

## 2022-10-24 DIAGNOSIS — Z79899 Other long term (current) drug therapy: Secondary | ICD-10-CM | POA: Diagnosis not present

## 2022-10-24 LAB — CBC WITH DIFFERENTIAL (CANCER CENTER ONLY)
Abs Immature Granulocytes: 0 10*3/uL (ref 0.00–0.07)
Basophils Absolute: 0 10*3/uL (ref 0.0–0.1)
Basophils Relative: 1 %
Eosinophils Absolute: 0 10*3/uL (ref 0.0–0.5)
Eosinophils Relative: 1 %
HCT: 39.9 % (ref 39.0–52.0)
Hemoglobin: 14.1 g/dL (ref 13.0–17.0)
Immature Granulocytes: 0 %
Lymphocytes Relative: 22 %
Lymphs Abs: 0.8 10*3/uL (ref 0.7–4.0)
MCH: 32.1 pg (ref 26.0–34.0)
MCHC: 35.3 g/dL (ref 30.0–36.0)
MCV: 90.9 fL (ref 80.0–100.0)
Monocytes Absolute: 0.5 10*3/uL (ref 0.1–1.0)
Monocytes Relative: 14 %
Neutro Abs: 2.2 10*3/uL (ref 1.7–7.7)
Neutrophils Relative %: 62 %
Platelet Count: 196 10*3/uL (ref 150–400)
RBC: 4.39 MIL/uL (ref 4.22–5.81)
RDW: 12.8 % (ref 11.5–15.5)
WBC Count: 3.5 10*3/uL — ABNORMAL LOW (ref 4.0–10.5)
nRBC: 0 % (ref 0.0–0.2)

## 2022-10-24 LAB — CMP (CANCER CENTER ONLY)
ALT: 64 U/L — ABNORMAL HIGH (ref 0–44)
AST: 36 U/L (ref 15–41)
Albumin: 4.5 g/dL (ref 3.5–5.0)
Alkaline Phosphatase: 178 U/L — ABNORMAL HIGH (ref 38–126)
Anion gap: 6 (ref 5–15)
BUN: 7 mg/dL — ABNORMAL LOW (ref 8–23)
CO2: 29 mmol/L (ref 22–32)
Calcium: 9.6 mg/dL (ref 8.9–10.3)
Chloride: 99 mmol/L (ref 98–111)
Creatinine: 0.8 mg/dL (ref 0.61–1.24)
GFR, Estimated: >60 mL/min (ref >60)
Glucose, Bld: 114 mg/dL — ABNORMAL HIGH (ref 70–99)
Potassium: 4.4 mmol/L (ref 3.5–5.1)
Sodium: 134 mmol/L — ABNORMAL LOW (ref 135–145)
Total Bilirubin: 0.6 mg/dL (ref 0.3–1.2)
Total Protein: 7.2 g/dL (ref 6.5–8.1)

## 2022-10-24 LAB — TOTAL PROTEIN, URINE DIPSTICK: Protein, ur: NEGATIVE mg/dL

## 2022-10-24 MED ORDER — TEMOZOLOMIDE 20 MG PO CAPS
80.0000 mg | ORAL_CAPSULE | Freq: Every day | ORAL | 0 refills | Status: DC
  Filled 2022-10-24 – 2022-10-25 (×2): qty 112, 28d supply, fill #0

## 2022-10-24 NOTE — Progress Notes (Signed)
[facility] [place], [place] [address] [phone]   Interval Evaluation  Date of Service: 10/24/22 Patient Name: Dorean Wolak Patient MRN: [id number] Patient DOB: 09-Aug-1961 Provider: Ventura Sellers, MD  Identifying Statement:  Montana Ottmar is a 62 y.o. male with left temporal glioblastoma   Oncologic History: Oncology History  Glioblastoma, IDH-wildtype ([place])  06/15/2020 Surgery   Stereotactic biopsy, L temporal [name]).  Path demonstrates glioblastoma IDH-1 wt   08/02/2020 Surgery   Debulking craniotomy with Dr. [name]   09/05/2020 - 10/17/2020 Radiation Therapy   IMRT with concurrent Temozolomide [name])   11/19/2020 - 06/18/2021 Chemotherapy   Completes #7 cycles adjuvant 5-day Temozolomide       07/17/2021 Progression   Progression of disease #1   07/18/2021 - 07/10/2022 Chemotherapy   Completes 8 cycles of CCNU '90mg'$ /m2 PO q6 weeks and Avastin '10mg'$ /kg IV q2 weeks   07/11/2022 Progression   Progression of disease #2   09/05/2022 Surgery   Repeat left temporal craniotomy with Dr. [name]; path is Glioblastoma IDHwt   09/19/2022 -  Chemotherapy   Initiates metronomic Temozolomide, '50mg'$ /m2 PO daily   09/23/2022 -  Chemotherapy   Patient is on Treatment Plan : BRAIN Low Grade Glioma Grade II, Glioblastoma,  Astrocytoma, Oligodendroma, Recurrent or Progressive / Temozolomide D1-5 Q28 Days       Biomarkers:  MGMT Unknown.  IDH 1/2 Wild type.  EGFR Unknown  TERT "Mutated   Interval History:  Race Hider presents today for follow up after completing craniotomy last month with Dr. [name].  Tolerated surgey well overall, no significant issues. No clinical changes today otherwise. No balance issues, motor dysfunction.  Continues on Keppra '750mg'$  BID, no further seizures.  H+P (07/24/20) Patient presented to medical attention in late October, 2021 with new onset seizure.  Event was decribed as loss of  consciousness, sudden, without clarity on further details aside from altered mental status upon awakening.  CNS imaging demonsrated non-enhancing mass within left temporal lobe c/w likely glioma; he underwent stereotactic biopsy with Dr. [name] on 06/15/20.  There was significant delay in finalizing path, explaining delay in follow up and evaluation.  He denies any seizures since discharge from hospital, on 4 anti-seizure drugs.  He does complain of dizziness and drowsiness with the medications, however.  Also describes impaired short term memory.  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    Medications: Current Outpatient Medications on File Prior to Visit  Medication Sig Dispense Refill   acetaminophen (TYLENOL) 500 MG tablet Take 500 mg by mouth every 6 (six) hours as needed for moderate pain.     levETIRAcetam (KEPPRA) 750 MG tablet Take 1 tablet (750 mg total) by mouth 2 (two) times daily. 60 tablet 3   Multiple Vitamin (MULTIVITAMIN WITH MINERALS) TABS tablet Take 1 tablet by mouth daily.     ondansetron (ZOFRAN) 8 MG tablet Take 1 tablet (8 mg total) by mouth every 8 (eight) hours as needed for nausea or vomiting. May take 30-60 minutes prior to Temodar administration if nausea/vomiting occurs as needed. 30 tablet 1   [DISCONTINUED] lacosamide (VIMPAT) 200 MG TABS tablet Take 0.5 tablets (100 mg total) by mouth 2 (two) times daily. 30 tablet 0   No current facility-administered medications on file prior to visit.    Allergies: No Known Allergies Past Medical History:  Past Medical History:  Diagnosis Date   Cancer ([place])    BRAIN TUMOR  Headache    Seizure [facility])    Past Surgical History:  Past Surgical History:  Procedure Laterality Date   APPLICATION OF CRANIAL NAVIGATION N/A 06/15/2020   Procedure: APPLICATION OF CRANIAL NAVIGATION;  Surgeon: [name], MD;  Location: [place];  Service: Neurosurgery;  Laterality: N/A;   APPLICATION OF CRANIAL NAVIGATION N/A  08/02/2020   Procedure: APPLICATION OF CRANIAL NAVIGATION;  Surgeon: [name], MD;  Location: [place];  Service: Neurosurgery;  Laterality: N/A;   APPLICATION OF CRANIAL NAVIGATION Left 09/05/2022   Procedure: APPLICATION OF CRANIAL NAVIGATION;  Surgeon: [name], MD;  Location: [place];  Service: Neurosurgery;  Laterality: Left;   CRANIOTOMY N/A 08/02/2020   Procedure: CRANIOTOMY LEFT TEMPORAL LOBECTOMY FOR TUMOR EXCISION;  Surgeon: [name], MD;  Location: [place];  Service: Neurosurgery;  Laterality: N/A;   CRANIOTOMY Left 09/05/2022   Procedure: CRANIOTOMY FOR RESECTION OF TEMPORAL LOBE TUMOR;  Surgeon: [name], MD;  Location: [place];  Service: Neurosurgery;  Laterality: Left;   FRAMELESS  BIOPSY WITH BRAINLAB Left 06/15/2020   Procedure: Left temporal lobe stereotactic brain biopsy with brainlab;  Surgeon: [name], MD;  Location: [place];  Service: Neurosurgery;  Laterality: Left;   Social History:  Social History   Socioeconomic History   Marital status: Married    Spouse name: Not on file   Number of children: Not on file   Years of education: Not on file   Highest education level: Not on file  Occupational History   Not on file  Tobacco Use   Smoking status: Never   Smokeless tobacco: Never  Vaping Use   Vaping Use: Never used  Substance and Sexual Activity   Alcohol use: Yes    Comment: 1 beer a month   Drug use: Never   Sexual activity: Yes    Partners: Female  Other Topics Concern   Not on file  Social History Narrative   Not on file   Social Determinants of Health   Financial Resource Strain: Not on file  Food Insecurity: Not on file  Transportation Needs: Not on file  Physical Activity: Not on file  Stress: Not on file  Social Connections: Not on file  Intimate Partner Violence: Not on file   Family History:  Family History  Problem Relation Age of Onset   Cancer Neg Hx     Review of Systems: Constitutional:  Doesn't report fevers, chills or abnormal weight loss Eyes: Doesn't report blurriness of vision Ears, nose, mouth, throat, and face: Doesn't report sore throat Respiratory: Doesn't report cough, dyspnea or wheezes Cardiovascular: Doesn't report palpitation, chest discomfort  Gastrointestinal:  Doesn't report nausea, constipation, diarrhea GU: Doesn't report incontinence Skin: Doesn't report skin rashes Neurological: Per HPI Musculoskeletal: Doesn't report joint pain Behavioral/Psych: Doesn't report anxiety  Physical Exam: Vitals:   10/24/22 1034  BP: 129/83  Pulse: 72  Resp: 16  Temp: 98.1 F (36.7 C)  SpO2: 100%     KPS: 90. General: Alert, cooperative, pleasant, in no acute distress Head: Normal EENT: No conjunctival injection or scleral icterus.  Lungs: Resp effort normal Cardiac: Regular rate Abdomen: Non-distended abdomen Skin: No rashes cyanosis or petechiae. Extremities: No clubbing or edema  Neurologic Exam: Mental Status: Awake, alert, attentive to examiner. Oriented to self and environment. Language is fluent with intact comprehension.  Cranial Nerves: Visual acuity is grossly normal. Visual fields are full. Extra-ocular movements intact. No ptosis. Face is symmetric Motor: Tone and bulk  are normal. Power is full in both arms and legs. Reflexes are symmetric, no pathologic reflexes present.  Sensory: Intact to light touch Gait: Normal.   Labs: I have reviewed the data as listed    Component Value Date/Time   NA 134 (L) 10/24/2022 1010   K 4.4 10/24/2022 1010   CL 99 10/24/2022 1010   CO2 29 10/24/2022 1010   GLUCOSE 114 (H) 10/24/2022 1010   BUN 7 (L) 10/24/2022 1010   CREATININE 0.80 10/24/2022 1010   CALCIUM 9.6 10/24/2022 1010   PROT 7.2 10/24/2022 1010   ALBUMIN 4.5 10/24/2022 1010   AST 36 10/24/2022 1010   ALT 64 (H) 10/24/2022 1010   ALKPHOS 178 (H) 10/24/2022 1010   BILITOT 0.6 10/24/2022 1010   GFRNONAA >60 10/24/2022 1010   Lab Results   Component Value Date   WBC 3.5 (L) 10/24/2022   NEUTROABS 2.2 10/24/2022   HGB 14.1 10/24/2022   HCT 39.9 10/24/2022   MCV 90.9 10/24/2022   PLT 196 10/24/2022     Assessment/Plan Glioblastoma, IDH-wildtype ([place]) [C71.9]  Jovoni Stolley is clinically stable today, now having completed cycle #1 metronomic Temodar.  We recommended continuing treatment with cycle #2 Temozolomide '50mg'$ /m2 given daily. The patient will have a complete blood count performed on days 21 and 28 of each cycle, and a comprehensive metabolic panel performed on day 28 of each cycle. Labs may need to be performed more often. Zofran will prescribed for home use for nausea/vomiting.   Chemotherapy should be held for the following:  ANC less than 1,000  Platelets less than 100,000  LFT or creatinine greater than 2x ULN  If clinical concerns/contraindications develop  Keppra will stay at '750mg'$  BID.  We appreciate the opportunity to participate in the care of Lorraine.    We ask that Mikaiah Calahan return to clinic in 1 months MRI brain for review prior to cycle #3, or sooner as needed.  All questions were answered. The patient knows to call the clinic with any problems, questions or concerns. No barriers to learning were detected.  The total time spent in the encounter was 30 minutes and more than 50% was on counseling and review of test results   Ventura Sellers, MD Medical Director of Neuro-Oncology [facility] at [place] 10/24/22 4:29 PM

## 2022-10-25 ENCOUNTER — Other Ambulatory Visit: Payer: Self-pay

## 2022-10-25 ENCOUNTER — Other Ambulatory Visit (HOSPITAL_COMMUNITY): Payer: Self-pay

## 2022-10-25 ENCOUNTER — Telehealth: Payer: Self-pay | Admitting: *Deleted

## 2022-10-25 NOTE — Telephone Encounter (Signed)
Information received from [place] ([place] interpreter): Patient/wife contacted her. His wife asked if Dr. Mickeal Skinner would call her, she wants to talk with him. Patient is very dizzy. It started yesterday evening and has become worse over night. He has to hold on to walls and furniture to walk about in house. He is also very nauseated. She said this happened since they saw the doctor yesterday.  Dr. Mickeal Skinner informed of patient/wife's message.  Dr. Renda Rolls response: He should go to ED for stroke eval  Contacted patient with Dr. Renda Rolls response: Both patient and wife on phone. Mr. Grube kept telling this nurse he felt better now after he had eaten and he did not want to go to ED. His wife said he was better too. I repeated Dr. Renda Rolls direction and said that he could go to ED at anytime, but especially if he started feeling the same as earlier, he should go. They both said they understood and that if anything changed that he should go to ED.  Dr. Mickeal Skinner informed of patient's response

## 2022-10-27 ENCOUNTER — Emergency Department (HOSPITAL_COMMUNITY)
Admission: EM | Admit: 2022-10-27 | Discharge: 2022-10-27 | Disposition: A | Discharge status: Home / Self Care | Payer: Medicaid Other | Attending: Emergency Medicine | Admitting: Emergency Medicine

## 2022-10-27 ENCOUNTER — Emergency Department (HOSPITAL_COMMUNITY): Payer: Medicaid Other

## 2022-10-27 ENCOUNTER — Encounter (HOSPITAL_COMMUNITY): Payer: Self-pay

## 2022-10-27 ENCOUNTER — Other Ambulatory Visit: Payer: Self-pay

## 2022-10-27 DIAGNOSIS — R519 Headache, unspecified: Secondary | ICD-10-CM | POA: Diagnosis present

## 2022-10-27 DIAGNOSIS — R7401 Elevation of levels of liver transaminase levels: Secondary | ICD-10-CM | POA: Insufficient documentation

## 2022-10-27 DIAGNOSIS — Z85841 Personal history of malignant neoplasm of brain: Secondary | ICD-10-CM | POA: Diagnosis not present

## 2022-10-27 DIAGNOSIS — C719 Malignant neoplasm of brain, unspecified: Secondary | ICD-10-CM

## 2022-10-27 DIAGNOSIS — E871 Hypo-osmolality and hyponatremia: Secondary | ICD-10-CM | POA: Insufficient documentation

## 2022-10-27 LAB — CBC WITH DIFFERENTIAL/PLATELET
Abs Immature Granulocytes: 0.01 10*3/uL (ref 0.00–0.07)
Basophils Absolute: 0 10*3/uL (ref 0.0–0.1)
Basophils Relative: 0 %
Eosinophils Absolute: 0 10*3/uL (ref 0.0–0.5)
Eosinophils Relative: 1 %
HCT: 38.4 % — ABNORMAL LOW (ref 39.0–52.0)
Hemoglobin: 13.5 g/dL (ref 13.0–17.0)
Immature Granulocytes: 0 %
Lymphocytes Relative: 13 %
Lymphs Abs: 0.6 10*3/uL — ABNORMAL LOW (ref 0.7–4.0)
MCH: 31.5 pg (ref 26.0–34.0)
MCHC: 35.2 g/dL (ref 30.0–36.0)
MCV: 89.7 fL (ref 80.0–100.0)
Monocytes Absolute: 0.5 10*3/uL (ref 0.1–1.0)
Monocytes Relative: 13 %
Neutro Abs: 3.1 10*3/uL (ref 1.7–7.7)
Neutrophils Relative %: 73 %
Platelets: 218 10*3/uL (ref 150–400)
RBC: 4.28 MIL/uL (ref 4.22–5.81)
RDW: 12.4 % (ref 11.5–15.5)
WBC: 4.2 10*3/uL (ref 4.0–10.5)
nRBC: 0 % (ref 0.0–0.2)

## 2022-10-27 LAB — COMPREHENSIVE METABOLIC PANEL
ALT: 63 U/L — ABNORMAL HIGH (ref 0–44)
AST: 41 U/L (ref 15–41)
Albumin: 3.9 g/dL (ref 3.5–5.0)
Alkaline Phosphatase: 138 U/L — ABNORMAL HIGH (ref 38–126)
Anion gap: 10 (ref 5–15)
BUN: 7 mg/dL — ABNORMAL LOW (ref 8–23)
CO2: 25 mmol/L (ref 22–32)
Calcium: 9 mg/dL (ref 8.9–10.3)
Chloride: 94 mmol/L — ABNORMAL LOW (ref 98–111)
Creatinine, Ser: 0.7 mg/dL (ref 0.61–1.24)
GFR, Estimated: >60 mL/min (ref >60)
Glucose, Bld: 139 mg/dL — ABNORMAL HIGH (ref 70–99)
Potassium: 3.7 mmol/L (ref 3.5–5.1)
Sodium: 129 mmol/L — ABNORMAL LOW (ref 135–145)
Total Bilirubin: 0.8 mg/dL (ref 0.3–1.2)
Total Protein: 6.3 g/dL — ABNORMAL LOW (ref 6.5–8.1)

## 2022-10-27 LAB — LACTIC ACID, PLASMA: Lactic Acid, Venous: 1.4 mmol/L (ref 0.5–1.9)

## 2022-10-27 MED ORDER — SODIUM CHLORIDE 0.9 % IV BOLUS
1000.0000 mL | Freq: Once | INTRAVENOUS | Status: AC
  Administered 2022-10-27: 1000 mL via INTRAVENOUS

## 2022-10-27 MED ORDER — PROCHLORPERAZINE EDISYLATE 10 MG/2ML IJ SOLN
10.0000 mg | Freq: Once | INTRAMUSCULAR | Status: AC
  Administered 2022-10-27: 10 mg via INTRAVENOUS
  Filled 2022-10-27: qty 2

## 2022-10-27 MED ORDER — DEXAMETHASONE SODIUM PHOSPHATE 10 MG/ML IJ SOLN
10.0000 mg | Freq: Once | INTRAMUSCULAR | Status: AC
  Administered 2022-10-27: 10 mg via INTRAVENOUS
  Filled 2022-10-27: qty 1

## 2022-10-27 MED ORDER — DEXAMETHASONE 4 MG PO TABS: 8.0000 mg | ORAL_TABLET | Freq: Every day | ORAL | 0 refills | Status: DC

## 2022-10-27 MED ORDER — KETOROLAC TROMETHAMINE 15 MG/ML IJ SOLN
15.0000 mg | Freq: Once | INTRAMUSCULAR | Status: AC
  Administered 2022-10-27: 15 mg via INTRAVENOUS
  Filled 2022-10-27: qty 1

## 2022-10-27 MED ORDER — ACETAMINOPHEN 325 MG PO TABS
650.0000 mg | ORAL_TABLET | Freq: Once | ORAL | Status: AC
  Administered 2022-10-27: 650 mg via ORAL
  Filled 2022-10-27: qty 2

## 2022-10-27 MED ORDER — DIPHENHYDRAMINE HCL 50 MG/ML IJ SOLN
25.0000 mg | Freq: Once | INTRAMUSCULAR | Status: AC
  Administered 2022-10-27: 25 mg via INTRAVENOUS
  Filled 2022-10-27: qty 1

## 2022-10-27 MED ORDER — GADOBUTROL 1 MMOL/ML IV SOLN
7.5000 mL | Freq: Once | INTRAVENOUS | Status: AC | PRN
  Administered 2022-10-27: 7.5 mL via INTRAVENOUS

## 2022-10-27 NOTE — ED Notes (Signed)
Patient transported to MRI 

## 2022-10-27 NOTE — Discharge Instructions (Addendum)
  Llame al Dr. Mickeal Skinner para hacer un seguimiento esta semana para analizar su rgimen de esteroides y tratamientos adicionales para el glioblastoma. Regrese al departamento de emergencias si sus dolores de cabeza [place], mareos y no puede caminar, pararse o Chiropractor. por tu cuenta sin ayuda.

## 2022-10-27 NOTE — ED Triage Notes (Signed)
Patient c/o headache x 12 days. Patient was  recommended by oncologist to come and get check out . Wife also concern about more memory loss, patient had brain surgery on January 2024.

## 2022-10-27 NOTE — ED Provider Notes (Addendum)
[facility] Provider Note   CSN: [id number] Arrival date & time: 10/27/22  [id number]     History  Chief Complaint  Patient presents with   Headache    Hunter Terry is a 62 y.o. male with past medical history significant for focal seizures, glioblastoma, currently undergoing treatment with Temodar, he is status post resection, craniotomy in 2021 with another surgery in January for repeat resection who presents with 12 days of headache, intermittent dizziness, headache worse with standing, improved with laying.  His wife reports that his confusion is worse than baseline overall but that he has periods of lucidity intermittently followed by return of some confusion.  He reports some pain in the top of the neck.  He denies any nausea, vomiting, he cannot differentiate whether his dizziness feels more like the room spinning or that he is going to pass out.  He denies any fever, chills, chest pain, shortness of breath.   Headache      Home Medications Prior to Admission medications   Medication Sig Start Date End Date Taking? Authorizing Provider  dexamethasone (DECADRON) 4 MG tablet Take 2 tablets (8 mg total) by mouth daily. 10/27/22  Yes Isabellamarie Randa H, PA-C  acetaminophen (TYLENOL) 500 MG tablet Take 500 mg by mouth every 6 (six) hours as needed for moderate pain.    [provider]  levETIRAcetam (KEPPRA) 750 MG tablet Take 1 tablet (750 mg total) by mouth 2 (two) times daily. 09/19/22   Ventura Sellers, MD  Multiple Vitamin (MULTIVITAMIN WITH MINERALS) TABS tablet Take 1 tablet by mouth daily.    [provider]  ondansetron (ZOFRAN) 8 MG tablet Take 1 tablet (8 mg total) by mouth every 8 (eight) hours as needed for nausea or vomiting. May take 30-60 minutes prior to Temodar administration if nausea/vomiting occurs as needed. 09/19/22   Ventura Sellers, MD  temozolomide (TEMODAR) 20 MG capsule Take 4 capsules (80 mg  total) by mouth daily for 28 days. May take on an empty stomach to decrease nausea & vomiting. 10/24/22 11/22/22  Ventura Sellers, MD  lacosamide (VIMPAT) 200 MG TABS tablet Take 0.5 tablets (100 mg total) by mouth 2 (two) times daily. 07/24/20 08/14/20  Ventura Sellers, MD      Allergies    Patient has no known allergies.    Review of Systems   Review of Systems  Neurological:  Positive for headaches.  All other systems reviewed and are negative.   Physical Exam Updated Vital Signs BP 108/86   Pulse 74   Temp 98.2 F (36.8 C) (Oral)   Resp 17   Ht 5\' 6"  (1.676 m)   Wt 67.9 kg   SpO2 98%   BMI 24.16 kg/m  Physical Exam Vitals and nursing note reviewed.  Constitutional:      General: He is not in acute distress.    Appearance: Normal appearance.  HENT:     Head: Normocephalic and atraumatic.  Eyes:     General:        Right eye: No discharge.        Left eye: No discharge.  Cardiovascular:     Rate and Rhythm: Normal rate and regular rhythm.     Heart sounds: No murmur heard.    No friction rub. No gallop.  Pulmonary:     Effort: Pulmonary effort is normal.     Breath sounds: Normal breath sounds.  Abdominal:  General: Bowel sounds are normal.     Palpations: Abdomen is soft.  Skin:    General: Skin is warm and dry.     Capillary Refill: Capillary refill takes less than 2 seconds.  Neurological:     Mental Status: He is alert. He is disoriented.     Comments: Moves all 4 limbs spontaneously, dizziness on standing, CN II through XII grossly intact.   Psychiatric:        Mood and Affect: Mood normal.        Behavior: Behavior normal.     ED Results / Procedures / Treatments   Labs (all labs ordered are listed, but only abnormal results are displayed) Labs Reviewed  CBC WITH DIFFERENTIAL/PLATELET - Abnormal; Notable for the following components:      Result Value   HCT 38.4 (*)    Lymphs Abs 0.6 (*)    All other components within normal limits   COMPREHENSIVE METABOLIC PANEL - Abnormal; Notable for the following components:   Sodium 129 (*)    Chloride 94 (*)    Glucose, Bld 139 (*)    BUN 7 (*)    Total Protein 6.3 (*)    ALT 63 (*)    Alkaline Phosphatase 138 (*)    All other components within normal limits  LACTIC ACID, PLASMA    EKG EKG Interpretation  Date/Time:  Sunday October 27 2022 06:39:04 EDT Ventricular Rate:  65 PR Interval:  133 QRS Duration: 93 QT Interval:  421 QTC Calculation: 438 R Axis:   74 Text Interpretation: Sinus rhythm RSR' in V1 or V2, probably normal variant No significant change since last tracing Confirmed by Ripley Fraise [phone]) on 10/27/2022 6:43:11 AM  Radiology MR Brain W and Wo Contrast  Result Date: 10/27/2022 CLINICAL DATA:  Provided history: Head/neck cancer, staging. Additional history provided: Brain surgery 08/31/2022. Additional history obtained from prior radiology records: History of temporal GBM status post craniotomy, chemoradiation, and repeat craniotomy/resection due to concern for progression. Most recent pathology consistent with GBM. EXAM: MRI HEAD WITHOUT AND WITH CONTRAST TECHNIQUE: Multiplanar, multiecho pulse sequences of the brain and surrounding structures were obtained without and with intravenous contrast. CONTRAST:  7.21mL GADAVIST GADOBUTROL 1 MMOL/ML IV SOLN COMPARISON:  Brain MRI 09/06/2022. FINDINGS: Brain: No age advanced or lobar predominant parenchymal atrophy. Interval evolution of postoperative changes from prior repeat craniotomy and left temporal lobe mass resection. There has been marked interval progression of irregular enhancement surrounding the resection cavity within the left temporal lobe, along the left insula and extending to the margin of the left lateral ventricle. This likely reflects tumor progression. There are small foci of diffusion-weighted signal abnormality along the margins of the resection cavity, which may reflect tumor and/or artifact  from chronic blood products. There has also been marked interval progression of T2 FLAIR hyperintense signal abnormality surrounding the resection cavity within the left temporal lobe and left insula, and extending more posteriorly and superiorly within the thalamus and left cerebral white matter. This is compatible with edema, and possible nonenhancing infiltrative tumor. New mass effect. There is now partial effacement of the left lateral and third ventricles, and 4 mm rightward midline shift measured at the level of the septum pellucidum. 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 Vascular: Maintained flow voids within the proximal large arterial vessels. Skull and upper cervical spine: Left-sided cranioplasty. No focal suspicious marrow lesion.  Sinuses/Orbits: No mass or acute finding within the imaged orbits. Trace mucosal thickening within the bilateral ethmoid and maxillary sinuses. IMPRESSION: Marked interval progression of irregular enhancement surrounding the left temporal resection cavity, also extending along the left insula and to the margin of the left lateral ventricle. This likely reflects tumor progression. There has also been marked interval progression of T2 FLAIR hyperintense signal abnormality surrounding the resection cavity within the left temporal lobe and left insula, and extending more posteriorly and superiorly within the left thalamus and cerebral white matter. This is compatible with edema, and possible non-enhancing infiltrative tumor. New mass effect with partial effacement of the left lateral and third ventricles, and 4 mm rightward midline shift measured at the level of the septum pellucidum. Electronically Signed   By: Kellie Simmering D.O.   On: 10/27/2022 11:18    Procedures .Critical Care  Performed by: Anselmo Pickler, PA-C Authorized by:  Anselmo Pickler, PA-C   Critical care provider statement:    Critical care time (minutes):  30   Critical care was necessary to treat or prevent imminent or life-threatening deterioration of the following conditions:  CNS failure or compromise (worsening glioblastoma with cerebral edema, mass effect)   Critical care was time spent personally by me on the following activities:  Development of treatment plan with patient or surrogate, discussions with consultants, evaluation of patient's response to treatment, examination of patient, ordering and review of laboratory studies, ordering and review of radiographic studies, ordering and performing treatments and interventions, pulse oximetry, re-evaluation of patient's condition and review of old charts     Medications Ordered in ED Medications  acetaminophen (TYLENOL) tablet 650 mg (650 mg Oral Given 10/27/22 0701)  sodium chloride 0.9 % bolus 1,000 mL (0 mLs Intravenous Stopped 10/27/22 0948)  ketorolac (TORADOL) 15 MG/ML injection 15 mg (15 mg Intravenous Given 10/27/22 0947)  prochlorperazine (COMPAZINE) injection 10 mg (10 mg Intravenous Given 10/27/22 0948)  diphenhydrAMINE (BENADRYL) injection 25 mg (25 mg Intravenous Given 10/27/22 0950)  gadobutrol (GADAVIST) 1 MMOL/ML injection 7.5 mL (7.5 mLs Intravenous Contrast Given 10/27/22 1045)  dexamethasone (DECADRON) injection 10 mg (10 mg Intravenous Given 10/27/22 1222)    ED Course/ Medical Decision Making/ A&P Clinical Course as of 10/27/22 1418  Sun Oct 27, 2022  0650 Pain worse with standing, improved with laying [CP]    Clinical Course User Index [CP] Anselmo Pickler, PA-C                             Medical Decision Making Amount and/or Complexity of Data Reviewed Labs: ordered. Radiology: ordered.  Risk OTC drugs. Prescription drug management.   This patient is a 62 y.o. male who presents to the ED for concern of headache, dizziness, worsening confusion, this  involves an extensive number of treatment options, and is a complaint that carries with it a high risk of complications and morbidity. The emergent differential diagnosis prior to evaluation includes, but is not limited to,  Stroke, increased ICP, meningitis, CVA, intracranial tumor, venous sinus thrombosis, migraine, cluster headache, hypertension, drug related, head injury, tension headache, sinusitis, dental abscess, otitis media, TMJ, temporal arteritis, glaucoma, trigeminal neuralgia, most suspected however is worsening of known glioblastoma, as most recent surgery was greater than 2 months ago low clinical suspicion overall for any infectious etiology.  . This is not an exhaustive differential.   Past Medical History / Co-morbidities / Social History: Previous history of hyponatremia,  seizures, known glioblastoma, last resection in January of this year, previous resection in 2021, he is not currently taking steroids, he is still taking Temodar for therapy  Additional history: Chart reviewed. Pertinent results include: Reviewed lab work, imaging, outpatient neurosurgery evaluation  Physical Exam: Physical exam performed. The pertinent findings include: Patient with some generalized confusion, but no focal neurodeficits noted, he is somewhat ataxic with gait, generalized weakness, dizziness, but no unilateral weakness, numbness  Lab Tests: I ordered, and personally interpreted labs.  The pertinent results include: CMP hyponatremia, we will fluid replete, elevation of ALT, alk phos, no significant change from baseline.  Mild elevated glucose at 139.  CBC unremarkable.  Initial lactic acid unremarkable.   Imaging Studies: I ordered imaging studies including MRI brain with and without contrast. I independently visualized and interpreted imaging which showed  Marked interval progression of irregular enhancement surrounding the  left temporal resection cavity, also extending along the left insula   and to the margin of the left lateral ventricle. This likely  reflects tumor progression. There has also been marked interval  progression of T2 FLAIR hyperintense signal abnormality surrounding  the resection cavity within the left temporal lobe and left insula,  and extending more posteriorly and superiorly within the left  thalamus and cerebral white matter. This is compatible with edema,  and possible non-enhancing infiltrative tumor. New mass effect with  partial effacement of the left lateral and third ventricles, and 4  mm rightward midline shift measured at the level of the septum  pellucidum.  . I agree with the radiologist interpretation.   Cardiac Monitoring:  The patient was maintained on a cardiac monitor.  My attending physician Dr. Maylon Peppers viewed and interpreted the cardiac monitored which showed an underlying rhythm of: NSR. I agree with this interpretation.   Medications: I ordered medication including fluids, Benadryl, Compazine, Toradol, Tylenol for headache, Decadron for cerebral edema. Reevaluation of the patient after these medicines showed that the patient improved. I have reviewed the patients home medicines and have made adjustments as needed.  Consultations Obtained: I requested consultation with the neurosurgeon, spoke with Dr. Parks Neptune,  and discussed lab and imaging findings as well as pertinent plan - they recommend: Decadron, and close neurosurgical follow-up, did not recommend admission at this time despite worsening mass effect, patient with known cause, aggressive glioblastoma, preference for outpatient management if possible with further recommendations to be made by his neurosurgeon.   Disposition: After consideration of the diagnostic results and the patients response to treatment, I feel that patient is ambulatory with assistance, and stable for discharge at this time, encourage close follow-up with his neurosurgeon, PCP to manage his hyponatremia and  aggressive glioblastoma.   emergency department workup does not suggest an emergent condition requiring admission or immediate intervention beyond what has been performed at this time. The plan is: as above. The patient is safe for discharge and has been instructed to return immediately for worsening symptoms, change in symptoms or any other concerns.  I discussed this case with my attending physician Dr. Maylon Peppers who cosigned this note including patient's presenting symptoms, physical exam, and planned diagnostics and interventions. Attending physician stated agreement with plan or made changes to plan which were implemented.    Final Clinical Impression(s) / ED Diagnoses Final diagnoses:  Glioblastoma ([place])  Bad headache  Hyponatremia    Rx / DC Orders ED Discharge Orders          Ordered    dexamethasone (DECADRON) 4 MG  tablet  Daily        10/27/22 1234              Myran Arcia, [place] H, PA-C 10/27/22 1416    Anselmo Pickler, PA-C 10/27/22 1418    Kemper Durie, DO 10/27/22 1510

## 2022-10-28 ENCOUNTER — Telehealth: Payer: Self-pay | Admitting: Internal Medicine

## 2022-10-28 ENCOUNTER — Other Ambulatory Visit: Payer: Self-pay

## 2022-10-28 ENCOUNTER — Other Ambulatory Visit (HOSPITAL_COMMUNITY): Payer: Self-pay

## 2022-10-28 NOTE — Telephone Encounter (Signed)
Per 3/18 IB reached out to patients wife to schedule follow up, patients wife aware of time and date of appointment.

## 2022-10-29 ENCOUNTER — Encounter: Payer: Self-pay | Admitting: Internal Medicine

## 2022-10-29 ENCOUNTER — Other Ambulatory Visit (HOSPITAL_COMMUNITY): Payer: Self-pay

## 2022-10-29 ENCOUNTER — Inpatient Hospital Stay (HOSPITAL_BASED_OUTPATIENT_CLINIC_OR_DEPARTMENT_OTHER): Payer: Medicaid Other | Admitting: Internal Medicine

## 2022-10-29 VITALS — BP 112/69 | HR 71 | Temp 97.7°F | Resp 15 | Wt 147.7 lb

## 2022-10-29 DIAGNOSIS — C719 Malignant neoplasm of brain, unspecified: Secondary | ICD-10-CM | POA: Diagnosis not present

## 2022-10-29 DIAGNOSIS — C712 Malignant neoplasm of temporal lobe: Secondary | ICD-10-CM | POA: Diagnosis not present

## 2022-10-29 DIAGNOSIS — R569 Unspecified convulsions: Secondary | ICD-10-CM | POA: Diagnosis not present

## 2022-10-29 DIAGNOSIS — Z5111 Encounter for antineoplastic chemotherapy: Secondary | ICD-10-CM | POA: Diagnosis not present

## 2022-10-29 NOTE — Progress Notes (Signed)
[facility] [place], [place] [address] [phone]   Interval Evaluation  Date of Service: 10/29/22 [name] Patient MRN: [id number] Patient DOB: [birth year] Provider: Ventura Sellers, MD  Identifying Statement:  Hunter Terry is a 62 y.o. male with left temporal glioblastoma   Oncologic History: Oncology History  Glioblastoma, IDH-wildtype ([place])  06/15/2020 Surgery   Stereotactic biopsy, L temporal Hunter Terry).  Path demonstrates glioblastoma IDH-1 wt   08/02/2020 Surgery   Debulking craniotomy with Dr. Marcello Terry   09/05/2020 - 10/17/2020 Radiation Therapy   IMRT with concurrent Temozolomide Hunter Terry)   11/19/2020 - 06/18/2021 Chemotherapy   Completes #7 cycles adjuvant 5-day Temozolomide       07/17/2021 Progression   Progression of disease #1   07/18/2021 - 07/10/2022 Chemotherapy   Completes 8 cycles of CCNU 90mg /m2 PO q6 weeks and Avastin 10mg /kg IV q2 weeks   07/11/2022 Progression   Progression of disease #2   09/05/2022 Surgery   Repeat left temporal craniotomy with Dr. Marcello Terry; path is Glioblastoma IDHwt   09/19/2022 -  Chemotherapy   Initiates metronomic Temozolomide, 50mg /m2 PO daily   09/23/2022 -  Chemotherapy   Patient is on Treatment Plan : BRAIN Low Grade Glioma Grade II, Glioblastoma,  Astrocytoma, Oligodendroma, Recurrent or Progressive / Temozolomide D1-5 Q28 Days       Biomarkers:  MGMT Unknown.  IDH 1/2 Wild type.  EGFR Unknown  TERT "Mutated   Interval History:  Hunter Terry presents today for follow up after recent ED visit and MRI brain.  He and his wife describe some increased difficulty with expressive language.  There was an episode over the weekend of "garbled speech followed by confusion" which prompted ED evaluation.No balance issues, motor dysfunction.  Continues on Keppra 750mg  BID, no headaches.  H+P (07/24/20) Patient presented to medical attention in late October,  2021 with new onset seizure.  Event was decribed as loss of consciousness, sudden, without clarity on further details aside from altered mental status upon awakening.  CNS imaging demonsrated non-enhancing mass within left temporal lobe c/w likely glioma; he underwent stereotactic biopsy with Dr. Marcello Terry on 06/15/20.  There was significant delay in finalizing path, explaining delay in follow up and evaluation.  He denies any seizures since discharge from hospital, on 4 anti-seizure drugs.  He does complain of dizziness and drowsiness with the medications, however.  Also describes impaired short term memory.  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    Medications: Current Outpatient Medications on File Prior to Visit  Medication Sig Dispense Refill   acetaminophen (TYLENOL) 500 MG tablet Take 500 mg by mouth every 6 (six) hours as needed for moderate pain.     dexamethasone (DECADRON) 4 MG tablet Take 2 tablets (8 mg total) by mouth daily. 28 tablet 0   levETIRAcetam (KEPPRA) 750 MG tablet Take 1 tablet (750 mg total) by mouth 2 (two) times daily. 60 tablet 3   Multiple Vitamin (MULTIVITAMIN WITH MINERALS) TABS tablet Take 1 tablet by mouth daily.     ondansetron (ZOFRAN) 8 MG tablet Take 1 tablet (8 mg total) by mouth every 8 (eight) hours as needed for nausea or vomiting. May take 30-60 minutes prior to Temodar administration if nausea/vomiting occurs as needed. 30 tablet 1   temozolomide (TEMODAR) 20 MG capsule Take 4 capsules (80 mg total) by mouth daily for 28 days. May take on an empty stomach to decrease nausea & vomiting. (  Patient not taking: Reported on 10/29/2022) 112 capsule 0   [DISCONTINUED] lacosamide (VIMPAT) 200 MG TABS tablet Take 0.5 tablets (100 mg total) by mouth 2 (two) times daily. 30 tablet 0   No current facility-administered medications on file prior to visit.    Allergies: No Known Allergies Past Medical History:  Past Medical History:  Diagnosis Date   Cancer  ([place])    BRAIN TUMOR   Headache    Seizure [facility])    Past Surgical History:  Past Surgical History:  Procedure Laterality Date   APPLICATION OF CRANIAL NAVIGATION N/A 06/15/2020   Procedure: APPLICATION OF CRANIAL NAVIGATION;  Surgeon: Hunter Mare, MD;  Location: [place];  Service: Neurosurgery;  Laterality: N/A;   APPLICATION OF CRANIAL NAVIGATION N/A 08/02/2020   Procedure: APPLICATION OF CRANIAL NAVIGATION;  Surgeon: Hunter Mare, MD;  Location: [facility];  Service: Neurosurgery;  Laterality: N/A;   APPLICATION OF CRANIAL NAVIGATION Left 09/05/2022   Procedure: APPLICATION OF CRANIAL NAVIGATION;  Surgeon: Hunter Mare, MD;  Location: [place];  Service: Neurosurgery;  Laterality: Left;   CRANIOTOMY N/A 08/02/2020   Procedure: CRANIOTOMY LEFT TEMPORAL LOBECTOMY FOR TUMOR EXCISION;  Surgeon: Hunter Mare, MD;  Location: [place] [facility];  Service: Neurosurgery;  Laterality: N/A;   CRANIOTOMY Left 09/05/2022   Procedure: CRANIOTOMY FOR RESECTION OF TEMPORAL LOBE TUMOR;  Surgeon: Hunter Mare, MD;  Location: [facility];  Service: Neurosurgery;  Laterality: Left;   FRAMELESS  BIOPSY WITH BRAINLAB Left 06/15/2020   Procedure: Left temporal lobe stereotactic brain biopsy with brainlab;  Surgeon: Hunter Mare, MD;  Location: [facility];  Service: Neurosurgery;  Laterality: Left;   Social History:  Social History   Socioeconomic History   Marital status: Married    Spouse name: Not on file   Number of children: Not on file   Years of education: Not on file   Highest education level: Not on file  Occupational History   Not on file  Tobacco Use   Smoking status: Never   Smokeless tobacco: Never  Vaping Use   Vaping Use: Never used  Substance and Sexual Activity   Alcohol use: Yes    Comment: 1 beer a month   Drug use: Never   Sexual activity: Yes    Partners: Female  Other Topics Concern   Not on file  Social History Narrative   Not on file   Social Determinants of Health    Financial Resource Strain: Not on file  Food Insecurity: Not on file  Transportation Needs: Not on file  Physical Activity: Not on file  Stress: Not on file  Social Connections: Not on file  Intimate Partner Violence: Not on file   Family History:  Family History  Problem Relation Age of Onset   Cancer Neg Hx     Review of Systems: Constitutional: Doesn't report fevers, chills or abnormal weight loss Eyes: Doesn't report blurriness of vision Ears, nose, mouth, throat, and face: Doesn't report sore throat Respiratory: Doesn't report cough, dyspnea or wheezes Cardiovascular: Doesn't report palpitation, chest discomfort  Gastrointestinal:  Doesn't report nausea, constipation, diarrhea GU: Doesn't report incontinence Skin: Doesn't report skin rashes Neurological: Per HPI Musculoskeletal: Doesn't report joint pain Behavioral/Psych: Doesn't report anxiety  Physical Exam: Vitals:   10/29/22 1227  BP: 112/69  Pulse: 71  Resp: 15  Temp: 97.7 F (36.5 C)  SpO2: 100%     KPS: 80. General: Alert, cooperative, pleasant, in no acute distress Head: Normal EENT: No  conjunctival injection or scleral icterus.  Lungs: Resp effort normal Cardiac: Regular rate Abdomen: Non-distended abdomen Skin: No rashes cyanosis or petechiae. Extremities: No clubbing or edema  Neurologic Exam: Mental Status: Awake, alert, attentive to examiner. Oriented to self and environment. Language has mildly impaired fluency with intact comprehension.  Cranial Nerves: Visual acuity is grossly normal. Visual fields are full. Extra-ocular movements intact. No ptosis. Face is symmetric Motor: Tone and bulk are normal. Power is full in both arms and legs. Reflexes are symmetric, no pathologic reflexes present.  Sensory: Intact to light touch Gait: Normal.   Labs: I have reviewed the data as listed    Component Value Date/Time   NA 129 (L) 10/27/2022 0648   K 3.7 10/27/2022 0648   CL 94 (L)  10/27/2022 0648   CO2 25 10/27/2022 0648   GLUCOSE 139 (H) 10/27/2022 0648   BUN 7 (L) 10/27/2022 0648   CREATININE 0.70 10/27/2022 0648   CREATININE 0.80 10/24/2022 1010   CALCIUM 9.0 10/27/2022 0648   PROT 6.3 (L) 10/27/2022 0648   ALBUMIN 3.9 10/27/2022 0648   AST 41 10/27/2022 0648   AST 36 10/24/2022 1010   ALT 63 (H) 10/27/2022 0648   ALT 64 (H) 10/24/2022 1010   ALKPHOS 138 (H) 10/27/2022 0648   BILITOT 0.8 10/27/2022 0648   BILITOT 0.6 10/24/2022 1010   GFRNONAA >60 10/27/2022 0648   GFRNONAA >60 10/24/2022 1010   Lab Results  Component Value Date   WBC 4.2 10/27/2022   NEUTROABS 3.1 10/27/2022   HGB 13.5 10/27/2022   HCT 38.4 (L) 10/27/2022   MCV 89.7 10/27/2022   PLT 218 10/27/2022   Imaging:  [facility] Clinician Interpretation: I have personally reviewed the CNS images as listed.  My interpretation, in the context of the patient's clinical presentation, is progressive disease  MR Brain W and Wo Contrast  Result Date: 10/27/2022 CLINICAL DATA:  Provided history: Head/neck cancer, staging. Additional history provided: Brain surgery 08/31/2022. Additional history obtained from prior radiology records: History of temporal GBM status post craniotomy, chemoradiation, and repeat craniotomy/resection due to concern for progression. Most recent pathology consistent with GBM. EXAM: MRI HEAD WITHOUT AND WITH CONTRAST TECHNIQUE: Multiplanar, multiecho pulse sequences of the brain and surrounding structures were obtained without and with intravenous contrast. CONTRAST:  7.89mL GADAVIST GADOBUTROL 1 MMOL/ML IV SOLN COMPARISON:  Brain MRI 09/06/2022. FINDINGS: Brain: No age advanced or lobar predominant parenchymal atrophy. Interval evolution of postoperative changes from prior repeat craniotomy and left temporal lobe mass resection. There has been marked interval progression of irregular enhancement surrounding the resection cavity within the left temporal lobe, along the left insula and  extending to the margin of the left lateral ventricle. This likely reflects tumor progression. There are small foci of diffusion-weighted signal abnormality along the margins of the resection cavity, which may reflect tumor and/or artifact from chronic blood products. There has also been marked interval progression of T2 FLAIR hyperintense signal abnormality surrounding the resection cavity within the left temporal lobe and left insula, and extending more posteriorly and superiorly within the thalamus and left cerebral white matter. This is compatible with edema, and possible nonenhancing infiltrative tumor. New mass effect. There is now partial effacement of the left lateral and third ventricles, and 4 mm rightward midline shift measured at the level of the septum pellucidum. 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 Vascular: Maintained flow voids  within the proximal large arterial vessels. Skull and upper cervical spine: Left-sided cranioplasty. No focal suspicious marrow lesion. Sinuses/Orbits: No mass or acute finding within the imaged orbits. Trace mucosal thickening within the bilateral ethmoid and maxillary sinuses. IMPRESSION: Marked interval progression of irregular enhancement surrounding the left temporal resection cavity, also extending along the left insula and to the margin of the left lateral ventricle. This likely reflects tumor progression. There has also been marked interval progression of T2 FLAIR hyperintense signal abnormality surrounding the resection cavity within the left temporal lobe and left insula, and extending more posteriorly and superiorly within the left thalamus and cerebral white matter. This is compatible with edema, and possible non-enhancing infiltrative tumor. New mass effect with partial effacement of the left lateral and third ventricles,  and 4 mm rightward midline shift measured at the level of the septum pellucidum. Electronically Signed   By: Hunter Terry D.O.   On: 10/27/2022 11:18    Assessment/Plan Glioblastoma, IDH-wildtype ([place]) [C71.9]  Hunter Terry is clinically progressive today, with increase in language expression deficits.  He may have experienced a breakthrough seizure as well.  MRI brain unfortunately demonstrates progression of enhancing tumor surrounding left temporal resection cavity.  We recommended discontinuing Temodar due to disease progression.  Will recommend transition to Irinotecan 125mg /m2 IV q2 weeks, with concurrent Avastin 10mg /kg IV q2 weeks.  We reviewed side effects, including diarrhea.  Avastin has been previously well tolerated.     Chemotherapy should be held for the following:  ANC less than 1,000  Platelets less than 100,000  LFT or creatinine greater than 2x ULN  If clinical concerns/contraindications develop  Avastin should be held for the following:  ANC less than 500  Platelets less than 50,000  LFT or creatinine greater than 2x ULN  If clinical concerns/contraindications develop  Keppra will stay at 750mg  BID for now.  Decadron should decrease to 4mg  daily until avastin is initiated.  We appreciate the opportunity to participate in the care of Hunter Terry.     We ask that Hunter Terry return to clinic for initiation of CPT-11 and avastin, or sooner as needed.  All questions were answered. The patient knows to call the clinic with any problems, questions or concerns. No barriers to learning were detected.  The total time spent in the encounter was 40 minutes and more than 50% was on counseling and review of test results   Ventura Sellers, MD Medical Director of Neuro-Oncology [facility] at [place] 10/29/22 3:09 PM

## 2022-10-30 ENCOUNTER — Telehealth: Payer: Self-pay | Admitting: Internal Medicine

## 2022-10-30 NOTE — Telephone Encounter (Signed)
Per WQ reached out to patient to setup appointments. Patients wife aware of time and dates of appointments.

## 2022-11-01 MED FILL — Dexamethasone Sodium Phosphate Inj 100 MG/10ML: INTRAMUSCULAR | Qty: 1 | Status: AC

## 2022-11-04 ENCOUNTER — Inpatient Hospital Stay: Payer: Medicaid Other

## 2022-11-04 ENCOUNTER — Other Ambulatory Visit: Payer: Self-pay

## 2022-11-04 ENCOUNTER — Inpatient Hospital Stay (HOSPITAL_BASED_OUTPATIENT_CLINIC_OR_DEPARTMENT_OTHER): Payer: Medicaid Other | Admitting: Internal Medicine

## 2022-11-04 VITALS — BP 125/63 | HR 71 | Temp 97.7°F | Resp 20 | Wt 149.0 lb

## 2022-11-04 DIAGNOSIS — Z5111 Encounter for antineoplastic chemotherapy: Secondary | ICD-10-CM | POA: Diagnosis not present

## 2022-11-04 DIAGNOSIS — R569 Unspecified convulsions: Secondary | ICD-10-CM | POA: Diagnosis not present

## 2022-11-04 DIAGNOSIS — C719 Malignant neoplasm of brain, unspecified: Secondary | ICD-10-CM

## 2022-11-04 DIAGNOSIS — C712 Malignant neoplasm of temporal lobe: Secondary | ICD-10-CM | POA: Diagnosis not present

## 2022-11-04 LAB — CBC WITH DIFFERENTIAL (CANCER CENTER ONLY)
Abs Immature Granulocytes: 0.04 10*3/uL (ref 0.00–0.07)
Basophils Absolute: 0 10*3/uL (ref 0.0–0.1)
Basophils Relative: 0 %
Eosinophils Absolute: 0 10*3/uL (ref 0.0–0.5)
Eosinophils Relative: 1 %
HCT: 40.6 % (ref 39.0–52.0)
Hemoglobin: 14.5 g/dL (ref 13.0–17.0)
Immature Granulocytes: 1 %
Lymphocytes Relative: 9 %
Lymphs Abs: 0.6 10*3/uL — ABNORMAL LOW (ref 0.7–4.0)
MCH: 32.2 pg (ref 26.0–34.0)
MCHC: 35.7 g/dL (ref 30.0–36.0)
MCV: 90 fL (ref 80.0–100.0)
Monocytes Absolute: 0.7 10*3/uL (ref 0.1–1.0)
Monocytes Relative: 10 %
Neutro Abs: 5.1 10*3/uL (ref 1.7–7.7)
Neutrophils Relative %: 79 %
Platelet Count: 205 10*3/uL (ref 150–400)
RBC: 4.51 MIL/uL (ref 4.22–5.81)
RDW: 13.3 % (ref 11.5–15.5)
WBC Count: 6.5 10*3/uL (ref 4.0–10.5)
nRBC: 0 % (ref 0.0–0.2)

## 2022-11-04 LAB — CMP (CANCER CENTER ONLY)
ALT: 180 U/L — ABNORMAL HIGH (ref 0–44)
AST: 74 U/L — ABNORMAL HIGH (ref 15–41)
Albumin: 4.3 g/dL (ref 3.5–5.0)
Alkaline Phosphatase: 151 U/L — ABNORMAL HIGH (ref 38–126)
Anion gap: 7 (ref 5–15)
BUN: 12 mg/dL (ref 8–23)
CO2: 29 mmol/L (ref 22–32)
Calcium: 9.5 mg/dL (ref 8.9–10.3)
Chloride: 102 mmol/L (ref 98–111)
Creatinine: 0.72 mg/dL (ref 0.61–1.24)
GFR, Estimated: >60 mL/min (ref >60)
Glucose, Bld: 124 mg/dL — ABNORMAL HIGH (ref 70–99)
Potassium: 3.8 mmol/L (ref 3.5–5.1)
Sodium: 138 mmol/L (ref 135–145)
Total Bilirubin: 0.6 mg/dL (ref 0.3–1.2)
Total Protein: 6.6 g/dL (ref 6.5–8.1)

## 2022-11-04 LAB — TOTAL PROTEIN, URINE DIPSTICK: Protein, ur: NEGATIVE mg/dL

## 2022-11-04 MED ORDER — SODIUM CHLORIDE 0.9 % IV SOLN
10.0000 mg | Freq: Once | INTRAVENOUS | Status: AC
  Administered 2022-11-04: 10 mg via INTRAVENOUS
  Filled 2022-11-04: qty 10

## 2022-11-04 MED ORDER — PALONOSETRON HCL INJECTION 0.25 MG/5ML
0.2500 mg | Freq: Once | INTRAVENOUS | Status: AC
  Administered 2022-11-04: 0.25 mg via INTRAVENOUS
  Filled 2022-11-04: qty 5

## 2022-11-04 MED ORDER — DEXAMETHASONE 1 MG PO TABS: ORAL_TABLET | ORAL | 0 refills | Status: AC

## 2022-11-04 MED ORDER — SODIUM CHLORIDE 0.9 % IV SOLN
125.0000 mg/m2 | Freq: Once | INTRAVENOUS | Status: AC
  Administered 2022-11-04: 220 mg via INTRAVENOUS
  Filled 2022-11-04: qty 11

## 2022-11-04 MED ORDER — SODIUM CHLORIDE 0.9 % IV SOLN
10.0000 mg/kg | Freq: Once | INTRAVENOUS | Status: AC
  Administered 2022-11-04: 700 mg via INTRAVENOUS
  Filled 2022-11-04: qty 16

## 2022-11-04 MED ORDER — ATROPINE SULFATE 1 MG/ML IV SOLN: 0.5000 mg | Freq: Once | INTRAVENOUS | Status: DC | PRN

## 2022-11-04 MED ORDER — SODIUM CHLORIDE 0.9 % IV SOLN: Freq: Once | INTRAVENOUS | Status: AC

## 2022-11-04 MED ORDER — ONDANSETRON HCL 8 MG PO TABS: 8.0000 mg | ORAL_TABLET | Freq: Three times a day (TID) | ORAL | 1 refills | Status: DC | PRN

## 2022-11-04 MED ORDER — LOPERAMIDE HCL 2 MG PO CAPS: ORAL_CAPSULE | ORAL | 1 refills | Status: DC

## 2022-11-04 NOTE — Progress Notes (Signed)
[facility] at [place], [place] [address] [phone]   Interval Evaluation  Date of Service: 11/04/22 [name] Patient MRN: [id number] Patient DOB: [birth year] Provider: Ventura Sellers, MD  Identifying Statement:  Hunter Terry is a 62 y.o. male with left temporal glioblastoma   Oncologic History: Oncology History  Glioblastoma, IDH-wildtype ([place])  06/15/2020 Surgery   Stereotactic biopsy, L temporal [name]).  Path demonstrates glioblastoma IDH-1 wt   08/02/2020 Surgery   Debulking craniotomy with Dr. [name]   09/05/2020 - 10/17/2020 Radiation Therapy   IMRT with concurrent Temozolomide [name])   11/19/2020 - 06/18/2021 Chemotherapy   Completes #7 cycles adjuvant 5-day Temozolomide       07/17/2021 Progression   Progression of disease #1   07/18/2021 - 07/10/2022 Chemotherapy   Completes 8 cycles of CCNU 90mg /m2 PO q6 weeks and Avastin 10mg /kg IV q2 weeks   07/11/2022 Progression   Progression of disease #2   09/05/2022 Surgery   Repeat left temporal craniotomy with Dr. [name]; path is Glioblastoma IDHwt   09/19/2022 -  Chemotherapy   Initiates metronomic Temozolomide, 50mg /m2 PO daily   09/23/2022 - 09/23/2022 Chemotherapy   Patient is on Treatment Plan : BRAIN Low Grade Glioma Grade II, Glioblastoma,  Astrocytoma, Oligodendroma, Recurrent or Progressive / Temozolomide D1-5 Q28 Days     11/04/2022 -  Chemotherapy   Patient is on Treatment Plan : BRAIN GBM [facility] Irinotecan D1,15 + Bevacizumab D1,15 q 28d       Biomarkers:  MGMT Unknown.  IDH 1/2 Wild type.  EGFR Unknown  TERT "Mutated   Interval History:  Hunter Terry presents today for initiation of IV chemotherapy.  No further confusion episodes since prior visit.  No balance issues, motor dysfunction.  Continues on Keppra 750mg  BID, no headaches.  H+P (07/24/20) Patient presented to medical attention in late October, 2021 with new onset  seizure.  Event was decribed as loss of consciousness, sudden, without clarity on further details aside from altered mental status upon awakening.  CNS imaging demonsrated non-enhancing mass within left temporal lobe c/w likely glioma; he underwent stereotactic biopsy with Dr. [name] on 06/15/20.  There was significant delay in finalizing path, explaining delay in follow up and evaluation.  He denies any seizures since discharge from hospital, on 4 anti-seizure drugs.  He does complain of dizziness and drowsiness with the medications, however.  Also describes impaired short term memory.  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    Medications: Current Outpatient Medications on File Prior to Visit  Medication Sig Dispense Refill   acetaminophen (TYLENOL) 500 MG tablet Take 500 mg by mouth every 6 (six) hours as needed for moderate pain.     dexamethasone (DECADRON) 4 MG tablet Take 2 tablets (8 mg total) by mouth daily. 28 tablet 0   levETIRAcetam (KEPPRA) 750 MG tablet Take 1 tablet (750 mg total) by mouth 2 (two) times daily. 60 tablet 3   Multiple Vitamin (MULTIVITAMIN WITH MINERALS) TABS tablet Take 1 tablet by mouth daily.     [DISCONTINUED] lacosamide (VIMPAT) 200 MG TABS tablet Take 0.5 tablets (100 mg total) by mouth 2 (two) times daily. 30 tablet 0   No current facility-administered medications on file prior to visit.    Allergies: No Known Allergies Past Medical History:  Past Medical History:  Diagnosis Date   Cancer ([place])    BRAIN TUMOR   Headache    Seizure ([place])  Past Surgical History:  Past Surgical History:  Procedure Laterality Date   APPLICATION OF CRANIAL NAVIGATION N/A 06/15/2020   Procedure: APPLICATION OF CRANIAL NAVIGATION;  Surgeon: [name], MD;  Location: [facility];  Service: Neurosurgery;  Laterality: N/A;   APPLICATION OF CRANIAL NAVIGATION N/A 08/02/2020   Procedure: APPLICATION OF CRANIAL NAVIGATION;  Surgeon: [name], MD;   Location: [facility];  Service: Neurosurgery;  Laterality: N/A;   APPLICATION OF CRANIAL NAVIGATION Left 09/05/2022   Procedure: APPLICATION OF CRANIAL NAVIGATION;  Surgeon: [name], MD;  Location: [facility];  Service: Neurosurgery;  Laterality: Left;   CRANIOTOMY N/A 08/02/2020   Procedure: CRANIOTOMY LEFT TEMPORAL LOBECTOMY FOR TUMOR EXCISION;  Surgeon: [name], MD;  Location: [facility];  Service: Neurosurgery;  Laterality: N/A;   CRANIOTOMY Left 09/05/2022   Procedure: CRANIOTOMY FOR RESECTION OF TEMPORAL LOBE TUMOR;  Surgeon: [name], MD;  Location: [facility];  Service: Neurosurgery;  Laterality: Left;   FRAMELESS  BIOPSY WITH BRAINLAB Left 06/15/2020   Procedure: Left temporal lobe stereotactic brain biopsy with brainlab;  Surgeon: [name], MD;  Location: [facility];  Service: Neurosurgery;  Laterality: Left;   Social History:  Social History   Socioeconomic History   Marital status: Married    Spouse name: Not on file   Number of children: Not on file   Years of education: Not on file   Highest education level: Not on file  Occupational History   Not on file  Tobacco Use   Smoking status: Never   Smokeless tobacco: Never  Vaping Use   Vaping Use: Never used  Substance and Sexual Activity   Alcohol use: Yes    Comment: 1 beer a month   Drug use: Never   Sexual activity: Yes    Partners: Female  Other Topics Concern   Not on file  Social History Narrative   Not on file   Social Determinants of Health   Financial Resource Strain: Not on file  Food Insecurity: Not on file  Transportation Needs: Not on file  Physical Activity: Not on file  Stress: Not on file  Social Connections: Not on file  Intimate Partner Violence: Not on file   Family History:  Family History  Problem Relation Age of Onset   Cancer Neg Hx     Review of Systems: Constitutional: Doesn't report fevers, chills or abnormal weight loss Eyes: Doesn't report blurriness of  vision Ears, nose, mouth, throat, and face: Doesn't report sore throat Respiratory: Doesn't report cough, dyspnea or wheezes Cardiovascular: Doesn't report palpitation, chest discomfort  Gastrointestinal:  Doesn't report nausea, constipation, diarrhea GU: Doesn't report incontinence Skin: Doesn't report skin rashes Neurological: Per HPI Musculoskeletal: Doesn't report joint pain Behavioral/Psych: Doesn't report anxiety  Physical Exam: Vitals:   11/04/22 1010  BP: 125/63  Pulse: 71  Resp: 20  Temp: 97.7 F (36.5 C)  SpO2: 100%   KPS: 80. General: Alert, cooperative, pleasant, in no acute distress Head: Normal EENT: No conjunctival injection or scleral icterus.  Lungs: Resp effort normal Cardiac: Regular rate Abdomen: Non-distended abdomen Skin: No rashes cyanosis or petechiae. Extremities: No clubbing or edema  Neurologic Exam: Mental Status: Awake, alert, attentive to examiner. Oriented to self and environment. Language has mildly impaired fluency with intact comprehension.  Cranial Nerves: Visual acuity is grossly normal. Visual fields are full. Extra-ocular movements intact. No ptosis. Face is symmetric Motor: Tone and bulk are normal. Power is full in both arms and  legs. Reflexes are symmetric, no pathologic reflexes present.  Sensory: Intact to light touch Gait: Normal.   Labs: I have reviewed the data as listed    Component Value Date/Time   NA 129 (L) 10/27/2022 0648   K 3.7 10/27/2022 0648   CL 94 (L) 10/27/2022 0648   CO2 25 10/27/2022 0648   GLUCOSE 139 (H) 10/27/2022 0648   BUN 7 (L) 10/27/2022 0648   CREATININE 0.70 10/27/2022 0648   CREATININE 0.80 10/24/2022 1010   CALCIUM 9.0 10/27/2022 0648   PROT 6.3 (L) 10/27/2022 0648   ALBUMIN 3.9 10/27/2022 0648   AST 41 10/27/2022 0648   AST 36 10/24/2022 1010   ALT 63 (H) 10/27/2022 0648   ALT 64 (H) 10/24/2022 1010   ALKPHOS 138 (H) 10/27/2022 0648   BILITOT 0.8 10/27/2022 0648   BILITOT 0.6  10/24/2022 1010   GFRNONAA >60 10/27/2022 0648   GFRNONAA >60 10/24/2022 1010   Lab Results  Component Value Date   WBC 4.2 10/27/2022   NEUTROABS 3.1 10/27/2022   HGB 13.5 10/27/2022   HCT 38.4 (L) 10/27/2022   MCV 89.7 10/27/2022   PLT 218 10/27/2022   Imaging:  [facility] Clinician Interpretation: I have personally reviewed the CNS images as listed.  My interpretation, in the context of the patient's clinical presentation, is progressive disease  MR Brain W and Wo Contrast  Result Date: 10/27/2022 CLINICAL DATA:  Provided history: Head/neck cancer, staging. Additional history provided: Brain surgery 08/31/2022. Additional history obtained from prior radiology records: History of temporal GBM status post craniotomy, chemoradiation, and repeat craniotomy/resection due to concern for progression. Most recent pathology consistent with GBM. EXAM: MRI HEAD WITHOUT AND WITH CONTRAST TECHNIQUE: Multiplanar, multiecho pulse sequences of the brain and surrounding structures were obtained without and with intravenous contrast. CONTRAST:  7.25mL GADAVIST GADOBUTROL 1 MMOL/ML IV SOLN COMPARISON:  Brain MRI 09/06/2022. FINDINGS: Brain: No age advanced or lobar predominant parenchymal atrophy. Interval evolution of postoperative changes from prior repeat craniotomy and left temporal lobe mass resection. There has been marked interval progression of irregular enhancement surrounding the resection cavity within the left temporal lobe, along the left insula and extending to the margin of the left lateral ventricle. This likely reflects tumor progression. There are small foci of diffusion-weighted signal abnormality along the margins of the resection cavity, which may reflect tumor and/or artifact from chronic blood products. There has also been marked interval progression of T2 FLAIR hyperintense signal abnormality surrounding the resection cavity within the left temporal lobe and left insula, and extending more  posteriorly and superiorly within the thalamus and left cerebral white matter. This is compatible with edema, and possible nonenhancing infiltrative tumor. New mass effect. There is now partial effacement of the left lateral and third ventricles, and 4 mm rightward midline shift measured at the level of the septum pellucidum. 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 Vascular: Maintained flow voids within the proximal large arterial vessels. Skull and upper cervical spine: Left-sided cranioplasty. No focal suspicious marrow lesion. Sinuses/Orbits: No mass or acute finding within the imaged orbits. Trace mucosal thickening within the bilateral ethmoid and maxillary sinuses. IMPRESSION: Marked interval progression of irregular enhancement surrounding the left temporal resection cavity, also extending along the left insula and to the margin of the left lateral ventricle. This likely reflects tumor progression. There has also been marked interval progression of T2 FLAIR hyperintense signal  abnormality surrounding the resection cavity within the left temporal lobe and left insula, and extending more posteriorly and superiorly within the left thalamus and cerebral white matter. This is compatible with edema, and possible non-enhancing infiltrative tumor. New mass effect with partial effacement of the left lateral and third ventricles, and 4 mm rightward midline shift measured at the level of the septum pellucidum. Electronically Signed   By: Kellie Simmering D.O.   On: 10/27/2022 11:18    Assessment/Plan Focal seizures ([place]) [R56.9]  Landan Gory presents today for initiation of CPT-11 and Avastin.    Will recommend proceeding with cycle #1 Irinotecan 125mg /m2 IV q2 weeks, with concurrent Avastin 10mg /kg IV q2 weeks.  We reviewed side effects, including diarrhea.  Avastin has been  previously well tolerated.  Imodium will be provided for use at home if needed.  Chemotherapy should be held for the following:  ANC less than 1,000  Platelets less than 100,000  LFT or creatinine greater than 2x ULN  If clinical concerns/contraindications develop  Avastin should be held for the following:  ANC less than 500  Platelets less than 50,000  LFT or creatinine greater than 2x ULN  If clinical concerns/contraindications develop  Keppra will stay at 750mg  BID for now.  Decadron, now at 4mg  daily, should decrease by 1mg  each week until discontinued.  We appreciate the opportunity to participate in the care of [place].     We ask that [employer] return in 2 weeks for cycle 1, day 15 CPT-11 and avastin, or sooner as needed.  All questions were answered. The patient knows to call the clinic with any problems, questions or concerns. No barriers to learning were detected.  The total time spent in the encounter was 30 minutes and more than 50% was on counseling and review of test results   Ventura Sellers, MD Medical Director of Neuro-Oncology [facility] at [place] 11/04/22 10:07 AM

## 2022-11-04 NOTE — Progress Notes (Signed)
Pt here for first time  Bevacizumab,  Irinotecan.   Pt saw Dr. Mickeal Skinner prior to chemo infusion today.  AST 74 ,  ALT  180.   Proceed with treatment as per Dr. Mickeal Skinner.

## 2022-11-04 NOTE — Patient Instructions (Signed)
Instrucciones al darle de alta: Discharge Instructions  Gracias por elegir al [facility] de Cncer de [place] para brindarle atencin mdica de oncologa y Music therapist.   Si usted tiene una cita de laboratorio con [place], por favor vaya directamente [place] y regstrese en el rea de Control and instrumentation engineer.   Use ropa cmoda y [place] para tener fcil acceso a las vas del Portacath (acceso venoso de Engineer, site duracin) o la lnea PICC (catter central colocado por va perifrica).   Nos esforzamos por ofrecerle tiempo de calidad con su proveedor. Es posible que tenga que volver a programar su cita si llega tarde (15 minutos o ms).  El llegar tarde le afecta a usted y a otros pacientes cuyas citas son posteriores a Merchandiser, retail.  Adems, si usted falta a tres o ms citas sin avisar a la oficina, puede ser retirado(a) de la clnica a discrecin del proveedor.      Para las solicitudes de renovacin de recetas, pida a su farmacia que se ponga en contacto con nuestra oficina y deje que transcurran 73 horas para que se complete el proceso de las renovaciones.    Hoy usted recibi los siguientes agentes de quimioterapia e/o inmunoterapia  :  Bevacizumab,  Irinotecan.   Para ayudar a prevenir las nuseas y los vmitos despus de su tratamiento, le recomendamos que tome su medicamento para las nuseas segn las indicaciones.  LOS SNTOMAS QUE DEBEN COMUNICARSE INMEDIATAMENTE SE INDICAN A CONTINUACIN: *FIEBRE SUPERIOR A 100.4 F (38 C) O MS *ESCALOFROS O SUDORACIN *NUSEAS Y VMITOS QUE NO SE CONTROLAN CON EL MEDICAMENTO PARA LAS NUSEAS *DIFICULTAD INUSUAL PARA RESPIRAR  *MORETONES O HEMORRAGIAS NO HABITUALES *PROBLEMAS URINARIOS (dolor o ardor al Garment/textile technologist o frecuencia para Garment/textile technologist) *PROBLEMAS INTESTINALES (diarrea inusual, estreimiento, dolor cerca del ano) SENSIBILIDAD EN LA BOCA Y EN LA GARGANTA CON O SIN LA PRESENCIA DE LCERAS (dolor de garganta, llagas en la boca o dolor de  muelas/dientes) ERUPCIN, HINCHAZN O DOLORES INUSUALES FLUJO VAGINAL INUSUAL O PICAZN/RASQUIA    Los puntos marcados con un asterisco ( *) indican una posible emergencia y debe hacer un seguimiento tan pronto como le sea posible o vaya al Departamento de Emergencias si se le presenta algn problema.  Por favor, muestre la [place] DE ADVERTENCIA DE Windy Canny DE ADVERTENCIA DE Benay Spice al registrarse en [address] de Emergencias y a la enfermera de triaje.  Si tiene preguntas despus de su visita o necesita cancelar o volver a programar su cita, por favor pngase en contacto con [place]  Dept: [phone] y [place] instrucciones. Las horas de oficina son de 8:00 a.m. a 4:30 p.m. de lunes a viernes. Por favor, tenga en cuenta que los mensajes de voz que se dejan despus de las 4:00 p.m. posiblemente no se devolvern hasta el siguiente da de [place].  Cerramos los fines de semana y [employer]. En todo momento tiene acceso a una enfermera para preguntas urgentes. Por favor, llame al nmero principal de la clnica Dept: [phone] y [place] instrucciones.   Para cualquier pregunta que no sea de carcter urgente, tambin puede ponerse en contacto con su proveedor [employer] Ahora ofrecemos visitas electrnicas para cualquier persona mayor de 18 aos que solicite atencin mdica en lnea para los sntomas que no sean urgentes. Para ms detalles vaya a [email or web address].   Tambin puede bajar la aplicacin de MyChart! Vaya a la tienda de aplicaciones, busque "  MyChart", abra la aplicacin, seleccione [place], e ingrese con su nombre de usuario y la contrasea de Pharmacist, community.

## 2022-11-05 ENCOUNTER — Other Ambulatory Visit (HOSPITAL_COMMUNITY): Payer: Self-pay

## 2022-11-06 ENCOUNTER — Telehealth: Payer: Self-pay

## 2022-11-06 NOTE — Telephone Encounter (Signed)
-----   Message from Rafael Bihari, RN sent at 11/05/2022 11:51 AM EDT ----- Regarding: Dr Mickeal Skinner pt, first time Irinotecan and Bevacizumab Dr Mickeal Skinner pt came in 3/25 for first time Irinotecan and Bevacizumab. Tolerated infusions well. Needs call back.

## 2022-11-06 NOTE — Telephone Encounter (Signed)
Patient's spouse Elsystates that he is doing fine. He is eating, drinkin, and urinating well. They know the office number [phone] to call if they have any questions or concerns.

## 2022-11-12 ENCOUNTER — Other Ambulatory Visit: Payer: Self-pay

## 2022-11-12 ENCOUNTER — Encounter: Payer: Self-pay | Admitting: Internal Medicine

## 2022-11-14 ENCOUNTER — Telehealth: Payer: Self-pay

## 2022-11-14 ENCOUNTER — Other Ambulatory Visit (HOSPITAL_COMMUNITY): Payer: Self-pay

## 2022-11-14 NOTE — Telephone Encounter (Signed)
Hunter Terry/Interpreter received a t/c from pt's wife stating [name] has had a runny nose for 4 days but today he had bleeding from his nose and was coughing up blood.  He was also c/o being hoarse.  She was advised to take him to the [facility] ED for evaluation and she agreed to this plan

## 2022-11-15 ENCOUNTER — Other Ambulatory Visit (HOSPITAL_COMMUNITY): Payer: Self-pay

## 2022-11-18 MED FILL — Dexamethasone Sodium Phosphate Inj 100 MG/10ML: INTRAMUSCULAR | Qty: 1 | Status: AC

## 2022-11-19 ENCOUNTER — Inpatient Hospital Stay: Payer: Medicare Other | Attending: Internal Medicine

## 2022-11-19 ENCOUNTER — Other Ambulatory Visit (HOSPITAL_COMMUNITY): Payer: Self-pay

## 2022-11-19 ENCOUNTER — Encounter: Payer: Self-pay | Admitting: Internal Medicine

## 2022-11-19 ENCOUNTER — Inpatient Hospital Stay: Payer: Medicare Other

## 2022-11-19 ENCOUNTER — Telehealth: Payer: Self-pay | Admitting: Internal Medicine

## 2022-11-19 ENCOUNTER — Other Ambulatory Visit: Payer: Medicaid Other

## 2022-11-19 ENCOUNTER — Other Ambulatory Visit: Payer: Self-pay

## 2022-11-19 ENCOUNTER — Inpatient Hospital Stay (HOSPITAL_BASED_OUTPATIENT_CLINIC_OR_DEPARTMENT_OTHER): Payer: Medicare Other | Admitting: Internal Medicine

## 2022-11-19 VITALS — BP 126/72 | HR 85 | Temp 97.9°F | Resp 18 | Wt 148.1 lb

## 2022-11-19 VITALS — BP 141/87 | HR 80 | Resp 18

## 2022-11-19 DIAGNOSIS — Z7952 Long term (current) use of systemic steroids: Secondary | ICD-10-CM | POA: Diagnosis not present

## 2022-11-19 DIAGNOSIS — C712 Malignant neoplasm of temporal lobe: Secondary | ICD-10-CM | POA: Diagnosis not present

## 2022-11-19 DIAGNOSIS — C719 Malignant neoplasm of brain, unspecified: Secondary | ICD-10-CM

## 2022-11-19 DIAGNOSIS — R400 Somnolence: Secondary | ICD-10-CM | POA: Diagnosis not present

## 2022-11-19 DIAGNOSIS — Z5111 Encounter for antineoplastic chemotherapy: Secondary | ICD-10-CM | POA: Insufficient documentation

## 2022-11-19 DIAGNOSIS — Z5112 Encounter for antineoplastic immunotherapy: Secondary | ICD-10-CM | POA: Diagnosis not present

## 2022-11-19 DIAGNOSIS — R42 Dizziness and giddiness: Secondary | ICD-10-CM | POA: Insufficient documentation

## 2022-11-19 DIAGNOSIS — Z923 Personal history of irradiation: Secondary | ICD-10-CM | POA: Diagnosis not present

## 2022-11-19 DIAGNOSIS — R490 Dysphonia: Secondary | ICD-10-CM | POA: Diagnosis not present

## 2022-11-19 DIAGNOSIS — Z79899 Other long term (current) drug therapy: Secondary | ICD-10-CM | POA: Diagnosis not present

## 2022-11-19 DIAGNOSIS — R569 Unspecified convulsions: Secondary | ICD-10-CM | POA: Diagnosis not present

## 2022-11-19 LAB — CMP (CANCER CENTER ONLY)
ALT: 175 U/L — ABNORMAL HIGH (ref 0–44)
AST: 60 U/L — ABNORMAL HIGH (ref 15–41)
Albumin: 3.5 g/dL (ref 3.5–5.0)
Alkaline Phosphatase: 347 U/L — ABNORMAL HIGH (ref 38–126)
Anion gap: 7 (ref 5–15)
BUN: 16 mg/dL (ref 8–23)
CO2: 27 mmol/L (ref 22–32)
Calcium: 9.5 mg/dL (ref 8.9–10.3)
Chloride: 101 mmol/L (ref 98–111)
Creatinine: 0.74 mg/dL (ref 0.61–1.24)
GFR, Estimated: >60 mL/min (ref >60)
Glucose, Bld: 139 mg/dL — ABNORMAL HIGH (ref 70–99)
Potassium: 4.4 mmol/L (ref 3.5–5.1)
Sodium: 135 mmol/L (ref 135–145)
Total Bilirubin: 0.8 mg/dL (ref 0.3–1.2)
Total Protein: 7.1 g/dL (ref 6.5–8.1)

## 2022-11-19 LAB — CBC WITH DIFFERENTIAL (CANCER CENTER ONLY)
Abs Immature Granulocytes: 0.26 10*3/uL — ABNORMAL HIGH (ref 0.00–0.07)
Basophils Absolute: 0.1 10*3/uL (ref 0.0–0.1)
Basophils Relative: 1 %
Eosinophils Absolute: 0 10*3/uL (ref 0.0–0.5)
Eosinophils Relative: 1 %
HCT: 38.9 % — ABNORMAL LOW (ref 39.0–52.0)
Hemoglobin: 13.3 g/dL (ref 13.0–17.0)
Immature Granulocytes: 8 %
Lymphocytes Relative: 20 %
Lymphs Abs: 0.7 10*3/uL (ref 0.7–4.0)
MCH: 31.1 pg (ref 26.0–34.0)
MCHC: 34.2 g/dL (ref 30.0–36.0)
MCV: 90.9 fL (ref 80.0–100.0)
Monocytes Absolute: 0.5 10*3/uL (ref 0.1–1.0)
Monocytes Relative: 15 %
Neutro Abs: 1.9 10*3/uL (ref 1.7–7.7)
Neutrophils Relative %: 55 %
Platelet Count: 181 10*3/uL (ref 150–400)
RBC: 4.28 MIL/uL (ref 4.22–5.81)
RDW: 14.2 % (ref 11.5–15.5)
Smear Review: NORMAL
WBC Count: 3.5 10*3/uL — ABNORMAL LOW (ref 4.0–10.5)
nRBC: 0 % (ref 0.0–0.2)

## 2022-11-19 LAB — TOTAL PROTEIN, URINE DIPSTICK: Protein, ur: NEGATIVE mg/dL

## 2022-11-19 MED ORDER — SODIUM CHLORIDE 0.9% FLUSH: 10.0000 mL | INTRAVENOUS | Status: DC | PRN

## 2022-11-19 MED ORDER — ATROPINE SULFATE 1 MG/ML IV SOLN: 0.5000 mg | Freq: Once | INTRAVENOUS | Status: DC

## 2022-11-19 MED ORDER — PALONOSETRON HCL INJECTION 0.25 MG/5ML
0.2500 mg | Freq: Once | INTRAVENOUS | Status: AC
  Administered 2022-11-19: 0.25 mg via INTRAVENOUS
  Filled 2022-11-19: qty 5

## 2022-11-19 MED ORDER — ATROPINE SULFATE 1 MG/ML IV SOLN
0.5000 mg | Freq: Once | INTRAVENOUS | Status: AC | PRN
  Administered 2022-11-19: 0.5 mg via INTRAVENOUS
  Filled 2022-11-19: qty 1

## 2022-11-19 MED ORDER — SODIUM CHLORIDE 0.9 % IV SOLN: Freq: Once | INTRAVENOUS | Status: AC

## 2022-11-19 MED ORDER — HEPARIN SOD (PORK) LOCK FLUSH 100 UNIT/ML IV SOLN: 500.0000 [IU] | Freq: Once | INTRAVENOUS | Status: DC | PRN

## 2022-11-19 MED ORDER — SODIUM CHLORIDE 0.9 % IV SOLN
10.0000 mg/kg | Freq: Once | INTRAVENOUS | Status: AC
  Administered 2022-11-19: 700 mg via INTRAVENOUS
  Filled 2022-11-19: qty 16

## 2022-11-19 MED ORDER — SODIUM CHLORIDE 0.9 % IV SOLN
10.0000 mg | Freq: Once | INTRAVENOUS | Status: AC
  Administered 2022-11-19: 10 mg via INTRAVENOUS
  Filled 2022-11-19: qty 10

## 2022-11-19 MED ORDER — IRINOTECAN HCL CHEMO INJECTION 100 MG/5ML
125.0000 mg/m2 | Freq: Once | INTRAVENOUS | Status: AC
  Administered 2022-11-19: 220 mg via INTRAVENOUS
  Filled 2022-11-19: qty 11

## 2022-11-19 NOTE — Progress Notes (Signed)
[facility] [address]  [place], [place] [address] [phone]   Interval Evaluation  Date of Service: 11/19/22 [name] Patient MRN: [id number] Patient DOB: [birth year] Provider: Henreitta Leber, MD  Identifying Statement:  Hunter Terry is a 62 y.o. male with left temporal glioblastoma   Oncologic History: Oncology History  Glioblastoma, IDH-wildtype  06/15/2020 Surgery   Stereotactic biopsy, L temporal Maisie Fus).  Path demonstrates glioblastoma IDH-1 wt   08/02/2020 Surgery   Debulking craniotomy with Dr. Maisie Fus   09/05/2020 - 10/17/2020 Radiation Therapy   IMRT with concurrent Temozolomide [name])   11/19/2020 - 06/18/2021 Chemotherapy   Completes #7 cycles adjuvant 5-day Temozolomide       07/17/2021 Progression   Progression of disease #1   07/18/2021 - 07/10/2022 Chemotherapy   Completes 8 cycles of CCNU 90mg /m2 PO q6 weeks and Avastin 10mg /kg IV q2 weeks   07/11/2022 Progression   Progression of disease #2   09/05/2022 Surgery   Repeat left temporal craniotomy with Dr. Maisie Fus; path is Glioblastoma IDHwt   09/19/2022 -  Chemotherapy   Initiates metronomic Temozolomide, 50mg /m2 PO daily   09/23/2022 - 09/23/2022 Chemotherapy   Patient is on Treatment Plan : BRAIN Low Grade Glioma Grade II, Glioblastoma,  Astrocytoma, Oligodendroma, Recurrent or Progressive / Temozolomide D1-5 Q28 Days     11/04/2022 -  Chemotherapy   Patient is on Treatment Plan : BRAIN GBM [facility] Irinotecan D1,15 + Bevacizumab D1,15 q 28d       Biomarkers:  MGMT Unknown.  IDH 1/2 Wild type.  EGFR Unknown  TERT "Mutated   Interval History:  Hunter Terry presents today for next infusion of irinotecan and avastin.  No new or progressive changes today.  He does describe hoarseness and dryness in mucosa, with some nose bleeding at night.  No balance issues, motor dysfunction.  Continues on Keppra 750mg  BID, no headaches.  H+P  (07/24/20) Patient presented to medical attention in late October, 2021 with new onset seizure.  Event was decribed as loss of consciousness, sudden, without clarity on further details aside from altered mental status upon awakening.  CNS imaging demonsrated non-enhancing mass within left temporal lobe c/w likely glioma; he underwent stereotactic biopsy with Dr. Maisie Fus on 06/15/20.  There was significant delay in finalizing path, explaining delay in follow up and evaluation.  He denies any seizures since discharge from hospital, on 4 anti-seizure drugs.  He does complain of dizziness and drowsiness with the medications, however.  Also describes impaired short term memory.  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    Medications: Current Outpatient Medications on File Prior to Visit  Medication Sig Dispense Refill   acetaminophen (TYLENOL) 500 MG tablet Take 500 mg by mouth every 6 (six) hours as needed for moderate pain.     dexamethasone (DECADRON) 1 MG tablet Take 3 tablets (3 mg total) by mouth daily with breakfast for 7 days, THEN 2 tablets (2 mg total) daily with breakfast for 7 days, THEN 1 tablet (1 mg total) daily with breakfast for 7 days. 42 tablet 0   levETIRAcetam (KEPPRA) 750 MG tablet Take 1 tablet (750 mg total) by mouth 2 (two) times daily. 60 tablet 3   loperamide (IMODIUM) 2 MG capsule Take 2 tabs by mouth with first loose stool, then 1 tab with each additional loose stool as needed. Do not exceed 8 tabs in a 24-hour period 30 capsule 1   Multiple Vitamin (MULTIVITAMIN  WITH MINERALS) TABS tablet Take 1 tablet by mouth daily.     ondansetron (ZOFRAN) 8 MG tablet Take 1 tablet (8 mg total) by mouth every 8 (eight) hours as needed for nausea, vomiting or refractory nausea / vomiting. Start on the third day after chemotherapy. 30 tablet 1   [DISCONTINUED] lacosamide (VIMPAT) 200 MG TABS tablet Take 0.5 tablets (100 mg total) by mouth 2 (two) times daily. 30 tablet 0   No current  facility-administered medications on file prior to visit.    Allergies: No Known Allergies Past Medical History:  Past Medical History:  Diagnosis Date   Cancer (HCC)    BRAIN TUMOR   Headache    Seizure [facility])    Past Surgical History:  Past Surgical History:  Procedure Laterality Date   APPLICATION OF CRANIAL NAVIGATION N/A 06/15/2020   Procedure: APPLICATION OF CRANIAL NAVIGATION;  Surgeon: Bedelia Person, MD;  Location: [facility] OR;  Service: Neurosurgery;  Laterality: N/A;   APPLICATION OF CRANIAL NAVIGATION N/A 08/02/2020   Procedure: APPLICATION OF CRANIAL NAVIGATION;  Surgeon: Bedelia Person, MD;  Location: [facility] OR;  Service: Neurosurgery;  Laterality: N/A;   APPLICATION OF CRANIAL NAVIGATION Left 09/05/2022   Procedure: APPLICATION OF CRANIAL NAVIGATION;  Surgeon: Bedelia Person, MD;  Location: [facility] OR;  Service: Neurosurgery;  Laterality: Left;   CRANIOTOMY N/A 08/02/2020   Procedure: CRANIOTOMY LEFT TEMPORAL LOBECTOMY FOR TUMOR EXCISION;  Surgeon: Bedelia Person, MD;  Location: [facility] OR;  Service: Neurosurgery;  Laterality: N/A;   CRANIOTOMY Left 09/05/2022   Procedure: CRANIOTOMY FOR RESECTION OF TEMPORAL LOBE TUMOR;  Surgeon: Bedelia Person, MD;  Location: [facility] OR;  Service: Neurosurgery;  Laterality: Left;   FRAMELESS  BIOPSY WITH BRAINLAB Left 06/15/2020   Procedure: Left temporal lobe stereotactic brain biopsy with brainlab;  Surgeon: Bedelia Person, MD;  Location: [facility] OR;  Service: Neurosurgery;  Laterality: Left;   Social History:  Social History   Socioeconomic History   Marital status: Married    Spouse name: Not on file   Number of children: Not on file   Years of education: Not on file   Highest education level: Not on file  Occupational History   Not on file  Tobacco Use   Smoking status: Never   Smokeless tobacco: Never  Vaping Use   Vaping Use: Never used  Substance and Sexual Activity   Alcohol use: Yes    Comment: 1 beer a month   Drug use:  Never   Sexual activity: Yes    Partners: Female  Other Topics Concern   Not on file  Social History Narrative   Not on file   Social Determinants of Health   Financial Resource Strain: Not on file  Food Insecurity: Not on file  Transportation Needs: Not on file  Physical Activity: Not on file  Stress: Not on file  Social Connections: Not on file  Intimate Partner Violence: Not on file   Family History:  Family History  Problem Relation Age of Onset   Cancer Neg Hx     Review of Systems: Constitutional: Doesn't report fevers, chills or abnormal weight loss Eyes: Doesn't report blurriness of vision Ears, nose, mouth, throat, and face: Doesn't report sore throat Respiratory: Doesn't report cough, dyspnea or wheezes Cardiovascular: Doesn't report palpitation, chest discomfort  Gastrointestinal:  Doesn't report nausea, constipation, diarrhea GU: Doesn't report incontinence Skin: Doesn't report skin rashes Neurological: Per HPI Musculoskeletal: Doesn't report joint pain Behavioral/Psych: Doesn't report anxiety  Physical  Exam: There were no vitals filed for this visit.  KPS: 80. General: Alert, cooperative, pleasant, in no acute distress Head: Normal EENT: No conjunctival injection or scleral icterus.  Lungs: Resp effort normal Cardiac: Regular rate Abdomen: Non-distended abdomen Skin: No rashes cyanosis or petechiae. Extremities: No clubbing or edema  Neurologic Exam: Mental Status: Awake, alert, attentive to examiner. Oriented to self and environment. Language has mildly impaired fluency with intact comprehension.  Cranial Nerves: Visual acuity is grossly normal. Visual fields are full. Extra-ocular movements intact. No ptosis. Face is symmetric Motor: Tone and bulk are normal. Power is full in both arms and legs. Reflexes are symmetric, no pathologic reflexes present.  Sensory: Intact to light touch Gait: Normal.   Labs: I have reviewed the data as listed     Component Value Date/Time   NA 138 11/04/2022 0945   K 3.8 11/04/2022 0945   CL 102 11/04/2022 0945   CO2 29 11/04/2022 0945   GLUCOSE 124 (H) 11/04/2022 0945   BUN 12 11/04/2022 0945   CREATININE 0.72 11/04/2022 0945   CALCIUM 9.5 11/04/2022 0945   PROT 6.6 11/04/2022 0945   ALBUMIN 4.3 11/04/2022 0945   AST 74 (H) 11/04/2022 0945   ALT 180 (H) 11/04/2022 0945   ALKPHOS 151 (H) 11/04/2022 0945   BILITOT 0.6 11/04/2022 0945   GFRNONAA >60 11/04/2022 0945   Lab Results  Component Value Date   WBC 3.5 (L) 11/19/2022   NEUTROABS PENDING 11/19/2022   HGB 13.3 11/19/2022   HCT 38.9 (L) 11/19/2022   MCV 90.9 11/19/2022   PLT 181 11/19/2022   Imaging:  [facility] Clinician Interpretation: I have personally reviewed the CNS images as listed.  My interpretation, in the context of the patient's clinical presentation, is progressive disease  MR Brain W and Wo Contrast  Result Date: 10/27/2022 CLINICAL DATA:  Provided history: Head/neck cancer, staging. Additional history provided: Brain surgery 08/31/2022. Additional history obtained from prior radiology records: History of temporal GBM status post craniotomy, chemoradiation, and repeat craniotomy/resection due to concern for progression. Most recent pathology consistent with GBM. EXAM: MRI HEAD WITHOUT AND WITH CONTRAST TECHNIQUE: Multiplanar, multiecho pulse sequences of the brain and surrounding structures were obtained without and with intravenous contrast. CONTRAST:  7.46mL GADAVIST GADOBUTROL 1 MMOL/ML IV SOLN COMPARISON:  Brain MRI 09/06/2022. FINDINGS: Brain: No age advanced or lobar predominant parenchymal atrophy. Interval evolution of postoperative changes from prior repeat craniotomy and left temporal lobe mass resection. There has been marked interval progression of irregular enhancement surrounding the resection cavity within the left temporal lobe, along the left insula and extending to the margin of the left lateral ventricle. This  likely reflects tumor progression. There are small foci of diffusion-weighted signal abnormality along the margins of the resection cavity, which may reflect tumor and/or artifact from chronic blood products. There has also been marked interval progression of T2 FLAIR hyperintense signal abnormality surrounding the resection cavity within the left temporal lobe and left insula, and extending more posteriorly and superiorly within the thalamus and left cerebral white matter. This is compatible with edema, and possible nonenhancing infiltrative tumor. New mass effect. There is now partial effacement of the left lateral and third ventricles, and 4 mm rightward midline shift measured at the level of the septum pellucidum. 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 Vascular: Maintained flow voids within the proximal large arterial vessels. Skull and upper  cervical spine: Left-sided cranioplasty. No focal suspicious marrow lesion. Sinuses/Orbits: No mass or acute finding within the imaged orbits. Trace mucosal thickening within the bilateral ethmoid and maxillary sinuses. IMPRESSION: Marked interval progression of irregular enhancement surrounding the left temporal resection cavity, also extending along the left insula and to the margin of the left lateral ventricle. This likely reflects tumor progression. There has also been marked interval progression of T2 FLAIR hyperintense signal abnormality surrounding the resection cavity within the left temporal lobe and left insula, and extending more posteriorly and superiorly within the left thalamus and cerebral white matter. This is compatible with edema, and possible non-enhancing infiltrative tumor. New mass effect with partial effacement of the left lateral and third ventricles, and 4 mm rightward midline shift measured at the level of the  septum pellucidum. Electronically Signed   By: Jackey LogeKyle  Golden D.O.   On: 10/27/2022 11:18    Assessment/Plan Focal seizures [R56.9]  Audrie Gallusuclides Cona presents today for CPT-11 and Avastin.  Labs demonstrates modest increase in Alk Phos, c/w with prior treatments.  Recommended nocturnal humidification for nasopharyngeal mucosal irritation from avastin.  Will recommend proceeding with cycle #1, day 15 of Irinotecan 125mg /m2 IV q2 weeks, with concurrent Avastin 10mg /kg IV q2 weeks.  We reviewed side effects, including diarrhea.  Avastin has been previously well tolerated.  Imodium will be provided for use at home if needed.  Chemotherapy should be held for the following:  ANC less than 1,000  Platelets less than 100,000  LFT or creatinine greater than 2x ULN  If clinical concerns/contraindications develop  Avastin should be held for the following:  ANC less than 500  Platelets less than 50,000  LFT or creatinine greater than 2x ULN  If clinical concerns/contraindications develop  Keppra will stay at 750mg  BID for now.  Decadron may be discontinued.  We appreciate the opportunity to participate in the care of Mantaj WildomarReyes.     We ask that Lubrizol CorporationEuclides Mcfall return in 2 weeks for cycle 1, day 15 CPT-11 and avastin, or sooner as needed.  All questions were answered. The patient knows to call the clinic with any problems, questions or concerns. No barriers to learning were detected.  The total time spent in the encounter was 30 minutes and more than 50% was on counseling and review of test results   Henreitta LeberZachary K Alexei Ey, MD Medical Director of Neuro-Oncology [facility] 11/19/22 10:41 AM

## 2022-11-19 NOTE — Progress Notes (Signed)
Toward the end of Irinotecan infusion, pt began c/o stomach cramping. 0.5mg  Atropine given. Stomach pain resolved and infusion was completed. Following completion of infusion, pt began complaining of stomach cramping again. MD notified. Pt offered 0.5 mg Atropine per MD. Pt declined, saying stomach pain had resolved. VSS at time of discharge.

## 2022-11-19 NOTE — Patient Instructions (Signed)
Instrucciones al darle de alta: Discharge Instructions  Gracias por elegir al [facility] [place] para brindarle atencin mdica de oncologa y hematologa.   Si usted tiene una cita de laboratorio con el [facility], por favor vaya directamente al [facility] y regstrese en el rea de registro.   Use ropa cmoda y adecuada para tener fcil acceso a las vas del Portacath (acceso venoso de larga duracin) o la lnea PICC (catter central colocado por va perifrica).   Nos esforzamos por ofrecerle tiempo de calidad con su proveedor. Es posible que tenga que volver a programar su cita si llega tarde (15 minutos o ms).  El llegar tarde le afecta a usted y a otros pacientes cuyas citas son posteriores a la suya.  Adems, si usted falta a tres o ms citas sin avisar a la oficina, puede ser retirado(a) de la clnica a discrecin del proveedor.      Para las solicitudes de renovacin de recetas, pida a su farmacia que se ponga en contacto con nuestra oficina y deje que transcurran 72 horas para que se complete el proceso de las renovaciones.    Hoy usted recibi los siguientes agentes de quimioterapia e/o inmunoterapia  :  Bevacizumab,  Irinotecan.   Para ayudar a prevenir las nuseas y los vmitos despus de su tratamiento, le recomendamos que tome su medicamento para las nuseas segn las indicaciones.  LOS SNTOMAS QUE DEBEN COMUNICARSE INMEDIATAMENTE SE INDICAN A CONTINUACIN: *FIEBRE SUPERIOR A 100.4 F (38 C) O MS *ESCALOFROS O SUDORACIN *NUSEAS Y VMITOS QUE NO SE CONTROLAN CON EL MEDICAMENTO PARA LAS NUSEAS *DIFICULTAD INUSUAL PARA RESPIRAR  *MORETONES O HEMORRAGIAS NO HABITUALES *PROBLEMAS URINARIOS (dolor o ardor al orinar o frecuencia para orinar) *PROBLEMAS INTESTINALES (diarrea inusual, estreimiento, dolor cerca del ano) SENSIBILIDAD EN LA BOCA Y EN LA GARGANTA CON O SIN LA PRESENCIA DE LCERAS (dolor de garganta, llagas en la boca o dolor de  muelas/dientes) ERUPCIN, HINCHAZN O DOLORES INUSUALES FLUJO VAGINAL INUSUAL O PICAZN/RASQUIA    Los puntos marcados con un asterisco ( *) indican una posible emergencia y debe hacer un seguimiento tan pronto como le sea posible o vaya al Departamento de Emergencias si se le presenta algn problema.  Por favor, muestre la TARJETA DE ADVERTENCIA DE QUIMIOTERAPIA O LA TARJETA DE ADVERTENCIA DE INMUNOTERAPIA al registrarse en el Departamento de Emergencias y a la enfermera de triaje.  Si tiene preguntas despus de su visita o necesita cancelar o volver a programar su cita, por favor pngase en contacto con [facility]  Dept: [phone] y siga las instrucciones. Las horas de oficina son de 8:00 a.m. a 4:30 p.m. de lunes a viernes. Por favor, tenga en cuenta que los mensajes de voz que se dejan despus de las 4:00 p.m. posiblemente no se devolvern hasta el siguiente da de trabajo.  Cerramos los fines de semana y los das festivos importantes. En todo momento tiene acceso a una enfermera para preguntas urgentes. Por favor, llame al nmero principal de la clnica Dept: [phone] y siga las instrucciones.   Para cualquier pregunta que no sea de carcter urgente, tambin puede ponerse en contacto con su proveedor utilizando MyChart. Ahora ofrecemos visitas electrnicas para cualquier persona mayor de 18 aos que solicite atencin mdica en lnea para los sntomas que no sean urgentes. Para ms detalles vaya a [email or web address].   Tambin puede bajar la aplicacin de MyChart! Vaya a la tienda de aplicaciones, busque "  MyChart", abra la aplicacin, seleccione [place], e ingrese con su nombre de usuario y la contrasea de MyChart.  

## 2022-11-19 NOTE — Telephone Encounter (Signed)
reached out to patient to schedule per WQ left voicemail.

## 2022-11-20 ENCOUNTER — Other Ambulatory Visit: Payer: Self-pay

## 2022-11-20 ENCOUNTER — Other Ambulatory Visit (HOSPITAL_COMMUNITY): Payer: Self-pay

## 2022-11-21 ENCOUNTER — Ambulatory Visit: Payer: Medicaid Other | Admitting: Internal Medicine

## 2022-11-21 ENCOUNTER — Other Ambulatory Visit: Payer: Medicaid Other

## 2022-11-22 ENCOUNTER — Other Ambulatory Visit (HOSPITAL_COMMUNITY): Payer: Self-pay

## 2022-11-22 ENCOUNTER — Other Ambulatory Visit: Payer: Self-pay

## 2022-11-23 ENCOUNTER — Emergency Department (HOSPITAL_COMMUNITY)
Admission: EM | Admit: 2022-11-23 | Discharge: 2022-11-23 | Disposition: A | Discharge status: Home / Self Care | Payer: Medicare Other | Attending: Emergency Medicine | Admitting: Emergency Medicine

## 2022-11-23 ENCOUNTER — Encounter (HOSPITAL_COMMUNITY): Payer: Self-pay | Admitting: Emergency Medicine

## 2022-11-23 ENCOUNTER — Other Ambulatory Visit: Payer: Self-pay

## 2022-11-23 DIAGNOSIS — R0981 Nasal congestion: Secondary | ICD-10-CM

## 2022-11-23 DIAGNOSIS — H6121 Impacted cerumen, right ear: Secondary | ICD-10-CM | POA: Diagnosis not present

## 2022-11-23 DIAGNOSIS — J029 Acute pharyngitis, unspecified: Secondary | ICD-10-CM | POA: Diagnosis present

## 2022-11-23 DIAGNOSIS — R0982 Postnasal drip: Secondary | ICD-10-CM | POA: Insufficient documentation

## 2022-11-23 DIAGNOSIS — R040 Epistaxis: Secondary | ICD-10-CM | POA: Insufficient documentation

## 2022-11-23 DIAGNOSIS — Z1152 Encounter for screening for COVID-19: Secondary | ICD-10-CM | POA: Insufficient documentation

## 2022-11-23 DIAGNOSIS — J020 Streptococcal pharyngitis: Secondary | ICD-10-CM | POA: Insufficient documentation

## 2022-11-23 LAB — SARS CORONAVIRUS 2 BY RT PCR: SARS Coronavirus 2 by RT PCR: NEGATIVE

## 2022-11-23 LAB — GROUP A STREP BY PCR: Group A Strep by PCR: DETECTED — AB

## 2022-11-23 MED ORDER — AMOXICILLIN 500 MG PO CAPS: 500.0000 mg | ORAL_CAPSULE | Freq: Two times a day (BID) | ORAL | 0 refills | Status: DC

## 2022-11-23 NOTE — ED Triage Notes (Signed)
Pt reports two weeks of sore throat and nasal congestion. In the mornings when he blows his nose he sometimes has a nosebleed. Cancer patient currently receiving immunotherapy.

## 2022-11-23 NOTE — Discharge Instructions (Signed)
Gracias por permitirme ser parte de su cuidado hoy.  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 Por favor comience a tomar esto lo antes posible.  Recomiendo seguir usando su humidificador y [employer] nasales salinos para ayudar con la sequedad. Abstente de sonarte la nariz ya que esto puede provocar que [place].  Deje de tomar aspirina y use Tylenol en su lugar. La aspirina puede aumentar el riesgo de hemorragia.  Recomiendo probar un antihistamnico de venta libre como Claritin, Zyrtec o Allegra para la congestin y la secrecin nasal.  Programe una cita de seguimiento con su mdico de atencin primaria y contine viendo a su onclogo segn lo programado. Haga un seguimiento con oncologa si sus sntomas empeoran.  He proporcionado informacin del consultorio de otorrinolaringologa (odo, nariz y Advertising copywriter). Puede programar una cita si contina teniendo sntomas a pesar de [employer].

## 2022-11-23 NOTE — ED Provider Notes (Signed)
[place] EMERGENCY DEPARTMENT AT [facility] Provider Note   CSN: [id number] Arrival date & time: 11/23/22  1337     History  Chief Complaint  Patient presents with   Sore Throat    Hunter Terry is a 63 y.o. male with past medical history significant for glioblastoma, IDH-wildtype currently undergoing treatment, seizures, headache presents to the ED complaining of sore throat, rhinorrhea and congestion for the past 2 weeks.  Patient states that he also has frequent small nosebleeds, especially in the morning when he blows his nose.  He has started using a humidifier and is taking OTC cold and flu medicines as well as aspirin.  Patient is currently undergoing treatment with Avastin and Irinotecan.  Denies fever, chills, shortness of breath, and cough.         Home Medications Prior to Admission medications   Medication Sig Start Date End Date Taking? Authorizing Provider  amoxicillin (AMOXIL) 500 MG capsule Take 1 capsule (500 mg total) by mouth 2 (two) times daily. 11/23/22  Yes Mckinzie Saksa R, PA-C  acetaminophen (TYLENOL) 500 MG tablet Take 500 mg by mouth every 6 (six) hours as needed for moderate pain.    [provider]  dexamethasone (DECADRON) 1 MG tablet Take 3 tablets (3 mg total) by mouth daily with breakfast for 7 days, THEN 2 tablets (2 mg total) daily with breakfast for 7 days, THEN 1 tablet (1 mg total) daily with breakfast for 7 days. 11/04/22 11/24/22  Henreitta Leber, MD  levETIRAcetam (KEPPRA) 750 MG tablet Take 1 tablet (750 mg total) by mouth 2 (two) times daily. 09/19/22   Henreitta Leber, MD  loperamide (IMODIUM) 2 MG capsule Take 2 tabs by mouth with first loose stool, then 1 tab with each additional loose stool as needed. Do not exceed 8 tabs in a 24-hour period 11/04/22   Henreitta Leber, MD  Multiple Vitamin (MULTIVITAMIN WITH MINERALS) TABS tablet Take 1 tablet by mouth daily.    [provider]  ondansetron (ZOFRAN) 8 MG  tablet Take 1 tablet (8 mg total) by mouth every 8 (eight) hours as needed for nausea, vomiting or refractory nausea / vomiting. Start on the third day after chemotherapy. 11/04/22   Henreitta Leber, MD  lacosamide (VIMPAT) 200 MG TABS tablet Take 0.5 tablets (100 mg total) by mouth 2 (two) times daily. 07/24/20 08/14/20  Henreitta Leber, MD      Allergies    Patient has no known allergies.    Review of Systems   Review of Systems  Constitutional:  Negative for chills and fever.  HENT:  Positive for congestion, ear pain (right ear), nosebleeds, rhinorrhea (clear with blood) and sore throat. Negative for ear discharge and trouble swallowing.     Physical Exam Updated Vital Signs BP 133/83 (BP Location: Right Arm)   Pulse 85   Temp 98 F (36.7 C)   Resp 18   SpO2 99%  Physical Exam Vitals and nursing note reviewed.  Constitutional:      General: He is not in acute distress.    Appearance: Normal appearance. He is not ill-appearing or diaphoretic.  HENT:     Head: Normocephalic.     Right Ear: Tympanic membrane and ear canal normal. No tenderness. There is impacted cerumen.     Left Ear: Tympanic membrane and ear canal normal. No tenderness. There is no impacted cerumen.     Nose: Congestion and rhinorrhea present. Rhinorrhea is clear and bloody.  Comments: There is a minimal amount of dried blood to both nares.     Mouth/Throat:     Mouth: Mucous membranes are moist.     Pharynx: Oropharynx is clear. Uvula midline. No pharyngeal swelling, oropharyngeal exudate or posterior oropharyngeal erythema.     Tonsils: No tonsillar exudate.     Comments: Clear post-nasal drip appreciated in the posterior oropharynx.  Eyes:     Conjunctiva/sclera: Conjunctivae normal.     Pupils: Pupils are equal, round, and reactive to light.  Cardiovascular:     Rate and Rhythm: Normal rate and regular rhythm.  Pulmonary:     Effort: Pulmonary effort is normal.  Neurological:     Mental Status:  He is alert. Mental status is at baseline.  Psychiatric:        Mood and Affect: Mood normal.        Behavior: Behavior normal.     ED Results / Procedures / Treatments   Labs (all labs ordered are listed, but only abnormal results are displayed) Labs Reviewed  GROUP A STREP BY PCR - Abnormal; Notable for the following components:      Result Value   Group A Strep by PCR DETECTED (*)    All other components within normal limits  SARS CORONAVIRUS 2 BY RT PCR    EKG None  Radiology No results found.  Procedures Procedures    Medications Ordered in ED Medications - No data to display  ED Course/ Medical Decision Making/ A&P                             Medical Decision Making Risk Prescription drug management.   This patient presents to the ED with chief complaint(s) of congestion, sore throat, epistaxis with pertinent past medical history of glioblastoma, IDH wild-type currently undergoing treatment.  The complaint involves an extensive differential diagnosis and also carries with it a high risk of complications and morbidity.    The differential diagnosis includes strep pharyngitis, COVID, influenza, RSV, seasonal/environmental allergies, adverse effects of immunotherapy/chemotherapeutics  The initial plan is to obtain COVID and strep swabs  Additional history obtained: Additional history obtained from family Records reviewed  oncology records.  Patient is currently undergoing treatment with Avastin and Irinotecan.  Patient was advised by oncology that Avastin can lead to nosebleeds or other bleeding.  He was recommended to use a humidifier and saline nasal sprays to help keep mucous membranes moist.  Reviewed outside ED records were patient was seen for nosebleed on 11/17/2022.  Patient was able to stop the bleeding at home.  Initial Assessment:   On exam, patient is resting comfortably in bed and is not in acute distress.  He does have congestion with clear  rhinorrhea and a minimal amount of dried blood to both nares.  Posterior oropharynx is mildly erythematous with clear postnasal drip.  No exudate or swelling appreciated.  Patient is able to speak in full sentences and swallow without difficulty.  Independent ECG/labs interpretation:  The following labs were independently interpreted:  Group A strep positive, COVID-negative.  Disposition:   Will send antibiotic to the pharmacy to treat group A strep pharyngitis.  Patient does not have any evidence of submandibular, peritonsillar, or pharyngeal swelling to suggest deep space infection.  I do not feel that imaging is required at this time.  Also discussed with patient use of saline nasal sprays, continue use of humidifier, and adding over-the-counter antihistamine to help  with nasal congestion and postnasal drip.  Discussed with patient he is on medication that can increase bleeding and to also avoid rigorous nose blowing.  Patient has been taking aspirin, recommend to discontinue use of aspirin and instead use Tylenol for sore throat or other pain symptoms.  Will provide ENT referral information should patient's symptoms not improve with treatment and time.  Patient verbalized his understanding.  The patient has been appropriately medically screened and/or stabilized in the ED. I have low suspicion for any other emergent medical condition which would require further screening, evaluation or treatment in the ED or require inpatient management. At time of discharge the patient is hemodynamically stable and in no acute distress. I have discussed work-up results and diagnosis with patient and answered all questions. Patient is agreeable with discharge plan. We discussed strict return precautions for returning to the emergency department and they verbalized understanding.            Final Clinical Impression(s) / ED Diagnoses Final diagnoses:  Strep pharyngitis  Post-nasal drip  Epistaxis  Sinus  congestion    Rx / DC Orders ED Discharge Orders          Ordered    amoxicillin (AMOXIL) 500 MG capsule  2 times daily        11/23/22 1603              Lenard Simmer, PA-C 11/23/22 1723    Ernie Avena, MD 11/23/22 1934

## 2022-11-29 MED FILL — Dexamethasone Sodium Phosphate Inj 100 MG/10ML: INTRAMUSCULAR | Qty: 1 | Status: AC

## 2022-12-02 ENCOUNTER — Ambulatory Visit: Payer: Medicare Other

## 2022-12-02 ENCOUNTER — Ambulatory Visit: Payer: Medicare Other | Admitting: Internal Medicine

## 2022-12-02 ENCOUNTER — Other Ambulatory Visit: Payer: Medicare Other

## 2022-12-02 ENCOUNTER — Inpatient Hospital Stay: Payer: Medicare Other

## 2022-12-02 ENCOUNTER — Inpatient Hospital Stay (HOSPITAL_BASED_OUTPATIENT_CLINIC_OR_DEPARTMENT_OTHER): Payer: Medicare Other | Admitting: Internal Medicine

## 2022-12-02 VITALS — BP 119/68 | HR 72 | Temp 97.7°F | Resp 20 | Wt 148.0 lb

## 2022-12-02 DIAGNOSIS — R569 Unspecified convulsions: Secondary | ICD-10-CM

## 2022-12-02 DIAGNOSIS — C719 Malignant neoplasm of brain, unspecified: Secondary | ICD-10-CM

## 2022-12-02 DIAGNOSIS — Z5112 Encounter for antineoplastic immunotherapy: Secondary | ICD-10-CM | POA: Diagnosis not present

## 2022-12-02 LAB — CBC WITH DIFFERENTIAL (CANCER CENTER ONLY)
Abs Immature Granulocytes: 0.03 10*3/uL (ref 0.00–0.07)
Basophils Absolute: 0 10*3/uL (ref 0.0–0.1)
Basophils Relative: 1 %
Eosinophils Absolute: 0 10*3/uL (ref 0.0–0.5)
Eosinophils Relative: 1 %
HCT: 36.7 % — ABNORMAL LOW (ref 39.0–52.0)
Hemoglobin: 13 g/dL (ref 13.0–17.0)
Immature Granulocytes: 2 %
Lymphocytes Relative: 37 %
Lymphs Abs: 0.6 10*3/uL — ABNORMAL LOW (ref 0.7–4.0)
MCH: 31.9 pg (ref 26.0–34.0)
MCHC: 35.4 g/dL (ref 30.0–36.0)
MCV: 90.2 fL (ref 80.0–100.0)
Monocytes Absolute: 0.3 10*3/uL (ref 0.1–1.0)
Monocytes Relative: 16 %
Neutro Abs: 0.8 10*3/uL — ABNORMAL LOW (ref 1.7–7.7)
Neutrophils Relative %: 43 %
Platelet Count: 245 10*3/uL (ref 150–400)
RBC: 4.07 MIL/uL — ABNORMAL LOW (ref 4.22–5.81)
RDW: 13.9 % (ref 11.5–15.5)
Smear Review: NORMAL
WBC Count: 1.7 10*3/uL — ABNORMAL LOW (ref 4.0–10.5)
nRBC: 0 % (ref 0.0–0.2)

## 2022-12-02 LAB — CMP (CANCER CENTER ONLY)
ALT: 76 U/L — ABNORMAL HIGH (ref 0–44)
AST: 50 U/L — ABNORMAL HIGH (ref 15–41)
Albumin: 3.7 g/dL (ref 3.5–5.0)
Alkaline Phosphatase: 429 U/L — ABNORMAL HIGH (ref 38–126)
Anion gap: 5 (ref 5–15)
BUN: 11 mg/dL (ref 8–23)
CO2: 30 mmol/L (ref 22–32)
Calcium: 9.5 mg/dL (ref 8.9–10.3)
Chloride: 102 mmol/L (ref 98–111)
Creatinine: 0.69 mg/dL (ref 0.61–1.24)
GFR, Estimated: >60 mL/min (ref >60)
Glucose, Bld: 125 mg/dL — ABNORMAL HIGH (ref 70–99)
Potassium: 4.4 mmol/L (ref 3.5–5.1)
Sodium: 137 mmol/L (ref 135–145)
Total Bilirubin: 0.5 mg/dL (ref 0.3–1.2)
Total Protein: 7.3 g/dL (ref 6.5–8.1)

## 2022-12-02 LAB — TOTAL PROTEIN, URINE DIPSTICK: Protein, ur: NEGATIVE mg/dL

## 2022-12-02 NOTE — Progress Notes (Signed)
[facility] at [facility] [address]  [place], [place] [address] [phone]   Interval Evaluation  Date of Service: 12/02/22 [name] Patient MRN: [id number] Patient DOB: 1960/12/17 Provider: Henreitta Leber, MD  Identifying Statement:  Hunter Terry is a 62 y.o. male with left temporal glioblastoma   Oncologic History: Oncology History  Glioblastoma, IDH-wildtype  06/15/2020 Surgery   Stereotactic biopsy, L temporal Maisie Fus).  Path demonstrates glioblastoma IDH-1 wt   08/02/2020 Surgery   Debulking craniotomy with Dr. Maisie Fus   09/05/2020 - 10/17/2020 Radiation Therapy   IMRT with concurrent Temozolomide [name])   11/19/2020 - 06/18/2021 Chemotherapy   Completes #7 cycles adjuvant 5-day Temozolomide       07/17/2021 Progression   Progression of disease #1   07/18/2021 - 07/10/2022 Chemotherapy   Completes 8 cycles of CCNU /m2 PO q6 weeks and Avastin /kg IV q2 weeks   07/11/2022 Progression   Progression of disease #2   09/05/2022 Surgery   Repeat left temporal craniotomy with Dr. Maisie Fus; path is Glioblastoma IDHwt   09/19/2022 -  Chemotherapy   Initiates metronomic Temozolomide, /m2 PO daily   09/23/2022 - 09/23/2022 Chemotherapy   Patient is on Treatment Plan : BRAIN Low Grade Glioma Grade II, Glioblastoma,  Astrocytoma, Oligodendroma, Recurrent or Progressive / Temozolomide D1-5 Q28 Days     11/04/2022 -  Chemotherapy   Patient is on Treatment Plan : BRAIN GBM [facility] Irinotecan D1,15 + Bevacizumab D1,15 q 28d       Biomarkers:  MGMT Unknown.  IDH 1/2 Wild type.  EGFR Unknown  TERT "Mutated   Interval History:  Hunter Terry presents today for next infusion of irinotecan and avastin.  He is currently one week into antiobiotic therapy for strep throat infection.  Symptoms are much improved.  No new or progressive neurologic changes today.  No balance issues, motor dysfunction.  Continues on Keppra   BID, no headaches.  H+P (07/24/20) Patient presented to medical attention in late October, 2021 with new onset seizure.  Event was decribed as loss of consciousness, sudden, without clarity on further details aside from altered mental status upon awakening.  CNS imaging demonsrated non-enhancing mass within left temporal lobe c/w likely glioma; he underwent stereotactic biopsy with Dr. Maisie Fus on 06/15/20.  There was significant delay in finalizing path, explaining delay in follow up and evaluation.  He denies any seizures since discharge from hospital, on 4 anti-seizure drugs.  He does complain of dizziness and drowsiness with the medications, however.  Also describes impaired short term memory.  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    Medications: Current Outpatient Medications on File Prior to Visit  Medication Sig Dispense Refill   acetaminophen (TYLENOL) 500 MG tablet Take 500 mg by mouth every 6 (six) hours as needed for moderate pain.     amoxicillin (AMOXIL) 500 MG capsule Take 1 capsule (500 mg total) by mouth 2 (two) times daily. 20 capsule 0   levETIRAcetam (KEPPRA) 750 MG tablet Take 1 tablet (750 mg total) by mouth 2 (two) times daily. 60 tablet 3   loperamide (IMODIUM) 2 MG capsule Take 2 tabs by mouth with first loose stool, then 1 tab with each additional loose stool as needed. Do not exceed 8 tabs in a 24-hour period 30 capsule 1   Multiple Vitamin (MULTIVITAMIN WITH MINERALS) TABS tablet Take 1 tablet by mouth daily.     ondansetron (ZOFRAN) 8 MG tablet Take 1 tablet (  8 mg total) by mouth every 8 (eight) hours as needed for nausea, vomiting or refractory nausea / vomiting. Start on the third day after chemotherapy. 30 tablet 1   [DISCONTINUED] lacosamide (VIMPAT) 200 MG TABS tablet Take 0.5 tablets (100 mg total) by mouth 2 (two) times daily. 30 tablet 0   No current facility-administered medications on file prior to visit.    Allergies: No Known Allergies Past  Medical History:  Past Medical History:  Diagnosis Date   Cancer    BRAIN TUMOR   Headache    Seizure    Past Surgical History:  Past Surgical History:  Procedure Laterality Date   APPLICATION OF CRANIAL NAVIGATION N/A 06/15/2020   Procedure: APPLICATION OF CRANIAL NAVIGATION;  Surgeon: Bedelia Person, MD;  Location: [facility] OR;  Service: Neurosurgery;  Laterality: N/A;   APPLICATION OF CRANIAL NAVIGATION N/A 08/02/2020   Procedure: APPLICATION OF CRANIAL NAVIGATION;  Surgeon: Bedelia Person, MD;  Location: [facility] OR;  Service: Neurosurgery;  Laterality: N/A;   APPLICATION OF CRANIAL NAVIGATION Left 09/05/2022   Procedure: APPLICATION OF CRANIAL NAVIGATION;  Surgeon: Bedelia Person, MD;  Location: [facility] OR;  Service: Neurosurgery;  Laterality: Left;   CRANIOTOMY N/A 08/02/2020   Procedure: CRANIOTOMY LEFT TEMPORAL LOBECTOMY FOR TUMOR EXCISION;  Surgeon: Bedelia Person, MD;  Location: [facility] OR;  Service: Neurosurgery;  Laterality: N/A;   CRANIOTOMY Left 09/05/2022   Procedure: CRANIOTOMY FOR RESECTION OF TEMPORAL LOBE TUMOR;  Surgeon: Bedelia Person, MD;  Location: [facility] OR;  Service: Neurosurgery;  Laterality: Left;   FRAMELESS  BIOPSY WITH BRAINLAB Left 06/15/2020   Procedure: Left temporal lobe stereotactic brain biopsy with brainlab;  Surgeon: Bedelia Person, MD;  Location: [facility] OR;  Service: Neurosurgery;  Laterality: Left;   Social History:  Social History   Socioeconomic History   Marital status: Married    Spouse name: Not on file   Number of children: Not on file   Years of education: Not on file   Highest education level: Not on file  Occupational History   Not on file  Tobacco Use   Smoking status: Never   Smokeless tobacco: Never  Vaping Use   Vaping Use: Never used  Substance and Sexual Activity   Alcohol use: Yes    Comment: 1 beer a month   Drug use: Never   Sexual activity: Yes    Partners: Female  Other Topics Concern   Not on file  Social History  Narrative   Not on file   Social Determinants of Health   Financial Resource Strain: Not on file  Food Insecurity: Not on file  Transportation Needs: Not on file  Physical Activity: Not on file  Stress: Not on file  Social Connections: Not on file  Intimate Partner Violence: Not on file   Family History:  Family History  Problem Relation Age of Onset   Cancer Neg Hx     Review of Systems: Constitutional: Doesn't report fevers, chills or abnormal weight loss Eyes: Doesn't report blurriness of vision Ears, nose, mouth, throat, and face: Doesn't report sore throat Respiratory: Doesn't report cough, dyspnea or wheezes Cardiovascular: Doesn't report palpitation, chest discomfort  Gastrointestinal:  Doesn't report nausea, constipation, diarrhea GU: Doesn't report incontinence Skin: Doesn't report skin rashes Neurological: Per HPI Musculoskeletal: Doesn't report joint pain Behavioral/Psych: Doesn't report anxiety  Physical Exam: Vitals:   12/02/22 1135  BP: 119/68  Pulse: 72  Resp: 20  Temp: 97.7 F (36.5 C)  SpO2: 98%  KPS: 80. General: Alert, cooperative, pleasant, in no acute distress Head: Normal EENT: No conjunctival injection or scleral icterus.  Lungs: Resp effort normal Cardiac: Regular rate Abdomen: Non-distended abdomen Skin: No rashes cyanosis or petechiae. Extremities: No clubbing or edema  Neurologic Exam: Mental Status: Awake, alert, attentive to examiner. Oriented to self and environment. Language has mildly impaired fluency with intact comprehension.  Cranial Nerves: Visual acuity is grossly normal. Visual fields are full. Extra-ocular movements intact. No ptosis. Face is symmetric Motor: Tone and bulk are normal. Power is full in both arms and legs. Reflexes are symmetric, no pathologic reflexes present.  Sensory: Intact to light touch Gait: Normal.   Labs: I have reviewed the data as listed    Component Value Date/Time   NA 135 11/19/2022  1016   K 4.4 11/19/2022 1016   CL 101 11/19/2022 1016   CO2 27 11/19/2022 1016   GLUCOSE 139 (H) 11/19/2022 1016   BUN 16 11/19/2022 1016   CREATININE 0.74 11/19/2022 1016   CALCIUM 9.5 11/19/2022 1016   PROT 7.1 11/19/2022 1016   ALBUMIN 3.5 11/19/2022 1016   AST 60 (H) 11/19/2022 1016   ALT 175 (H) 11/19/2022 1016   ALKPHOS 347 (H) 11/19/2022 1016   BILITOT 0.8 11/19/2022 1016   GFRNONAA >60 11/19/2022 1016   Lab Results  Component Value Date   WBC 3.5 (L) 11/19/2022   NEUTROABS 1.9 11/19/2022   HGB 13.3 11/19/2022   HCT 38.9 (L) 11/19/2022   MCV 90.9 11/19/2022   PLT 181 11/19/2022    Assessment/Plan Focal seizures [R56.9]  Hunter Terry presents today for CPT-11 and Avastin.  He is still under active antibiotics for strep A pharyngitis, swab confirmed.  Labs demonstrates leukopenia, neutrophil count is pending.    Will recommend HOLDING cycle #2, day 1 of Irinotecan /m2 IV q2 weeks, with concurrent Avastin /kg IV q2 weeks.  We reviewed side effects, including diarrhea.  Avastin has been previously well tolerated.  Imodium will be provided for use at home if needed.  Chemotherapy should be held for the following:  ANC less than 1,000  Platelets less than 100,000  LFT or creatinine greater than 2x ULN  If clinical concerns/contraindications develop  Avastin should be held for the following:  ANC less than 500  Platelets less than 50,000  LFT or creatinine greater than 2x ULN  If clinical concerns/contraindications develop  We can bring him back in 2 weeks for repeat labs and planned resumption of CPT-11/Avastin. Keppra will stay at  BID for now.  Recommended nocturnal humidification for nasopharyngeal mucosal irritation from avastin.  We appreciate the opportunity to participate in the care of Hunter Terry.     We ask that Hunter Terry return in 2 weeks for cycle 2, day 1 CPT-11 and avastin, or sooner as needed.  All questions were  answered. The patient knows to call the clinic with any problems, questions or concerns. No barriers to learning were detected.  The total time spent in the encounter was 30 minutes and more than 50% was on counseling and review of test results   Henreitta Leber, MD Medical Director of Neuro-Oncology [facility] at [facility] 12/02/22 11:31 AM

## 2022-12-04 ENCOUNTER — Telehealth: Payer: Self-pay | Admitting: Internal Medicine

## 2022-12-04 NOTE — Telephone Encounter (Signed)
Scheduled per 04/22 los, patient has been called and notified of upcoming appointments. 

## 2022-12-13 MED FILL — Dexamethasone Sodium Phosphate Inj 100 MG/10ML: INTRAMUSCULAR | Qty: 1 | Status: AC

## 2022-12-16 ENCOUNTER — Inpatient Hospital Stay: Payer: Medicare Other | Attending: Internal Medicine

## 2022-12-16 ENCOUNTER — Inpatient Hospital Stay: Payer: Medicare Other

## 2022-12-16 ENCOUNTER — Other Ambulatory Visit: Payer: Self-pay

## 2022-12-16 ENCOUNTER — Inpatient Hospital Stay (HOSPITAL_BASED_OUTPATIENT_CLINIC_OR_DEPARTMENT_OTHER): Payer: Medicare Other | Admitting: Internal Medicine

## 2022-12-16 VITALS — BP 108/68 | HR 60 | Resp 16

## 2022-12-16 VITALS — BP 112/64 | HR 70 | Temp 98.4°F | Resp 18 | Wt 149.6 lb

## 2022-12-16 DIAGNOSIS — C719 Malignant neoplasm of brain, unspecified: Secondary | ICD-10-CM

## 2022-12-16 DIAGNOSIS — Z79899 Other long term (current) drug therapy: Secondary | ICD-10-CM | POA: Diagnosis not present

## 2022-12-16 DIAGNOSIS — R569 Unspecified convulsions: Secondary | ICD-10-CM

## 2022-12-16 DIAGNOSIS — Z923 Personal history of irradiation: Secondary | ICD-10-CM | POA: Insufficient documentation

## 2022-12-16 DIAGNOSIS — C712 Malignant neoplasm of temporal lobe: Secondary | ICD-10-CM | POA: Insufficient documentation

## 2022-12-16 DIAGNOSIS — Z5111 Encounter for antineoplastic chemotherapy: Secondary | ICD-10-CM | POA: Diagnosis present

## 2022-12-16 LAB — CBC WITH DIFFERENTIAL (CANCER CENTER ONLY)
Abs Immature Granulocytes: 0.02 10*3/uL (ref 0.00–0.07)
Basophils Absolute: 0 10*3/uL (ref 0.0–0.1)
Basophils Relative: 1 %
Eosinophils Absolute: 0.1 10*3/uL (ref 0.0–0.5)
Eosinophils Relative: 1 %
HCT: 40.2 % (ref 39.0–52.0)
Hemoglobin: 14.1 g/dL (ref 13.0–17.0)
Immature Granulocytes: 1 %
Lymphocytes Relative: 28 %
Lymphs Abs: 1.1 10*3/uL (ref 0.7–4.0)
MCH: 31.8 pg (ref 26.0–34.0)
MCHC: 35.1 g/dL (ref 30.0–36.0)
MCV: 90.7 fL (ref 80.0–100.0)
Monocytes Absolute: 0.6 10*3/uL (ref 0.1–1.0)
Monocytes Relative: 15 %
Neutro Abs: 2.1 10*3/uL (ref 1.7–7.7)
Neutrophils Relative %: 54 %
Platelet Count: 229 10*3/uL (ref 150–400)
RBC: 4.43 MIL/uL (ref 4.22–5.81)
RDW: 14.6 % (ref 11.5–15.5)
WBC Count: 3.9 10*3/uL — ABNORMAL LOW (ref 4.0–10.5)
nRBC: 0 % (ref 0.0–0.2)

## 2022-12-16 LAB — CMP (CANCER CENTER ONLY)
ALT: 30 U/L (ref 0–44)
AST: 27 U/L (ref 15–41)
Albumin: 4 g/dL (ref 3.5–5.0)
Alkaline Phosphatase: 188 U/L — ABNORMAL HIGH (ref 38–126)
Anion gap: 6 (ref 5–15)
BUN: 10 mg/dL (ref 8–23)
CO2: 28 mmol/L (ref 22–32)
Calcium: 9.2 mg/dL (ref 8.9–10.3)
Chloride: 103 mmol/L (ref 98–111)
Creatinine: 0.78 mg/dL (ref 0.61–1.24)
GFR, Estimated: >60 mL/min (ref >60)
Glucose, Bld: 104 mg/dL — ABNORMAL HIGH (ref 70–99)
Potassium: 4.2 mmol/L (ref 3.5–5.1)
Sodium: 137 mmol/L (ref 135–145)
Total Bilirubin: 0.5 mg/dL (ref 0.3–1.2)
Total Protein: 7.4 g/dL (ref 6.5–8.1)

## 2022-12-16 LAB — TOTAL PROTEIN, URINE DIPSTICK: Protein, ur: NEGATIVE mg/dL

## 2022-12-16 MED ORDER — PALONOSETRON HCL INJECTION 0.25 MG/5ML
0.2500 mg | Freq: Once | INTRAVENOUS | Status: AC
  Administered 2022-12-16: 0.25 mg via INTRAVENOUS
  Filled 2022-12-16: qty 5

## 2022-12-16 MED ORDER — SODIUM CHLORIDE 0.9 % IV SOLN
125.0000 mg/m2 | Freq: Once | INTRAVENOUS | Status: AC
  Administered 2022-12-16: 220 mg via INTRAVENOUS
  Filled 2022-12-16: qty 11

## 2022-12-16 MED ORDER — ATROPINE SULFATE 1 MG/ML IV SOLN
0.5000 mg | Freq: Once | INTRAVENOUS | Status: AC | PRN
  Administered 2022-12-16: 0.5 mg via INTRAVENOUS
  Filled 2022-12-16: qty 1

## 2022-12-16 MED ORDER — SODIUM CHLORIDE 0.9 % IV SOLN: Freq: Once | INTRAVENOUS | Status: AC

## 2022-12-16 MED ORDER — SODIUM CHLORIDE 0.9 % IV SOLN
10.0000 mg | Freq: Once | INTRAVENOUS | Status: AC
  Administered 2022-12-16: 10 mg via INTRAVENOUS
  Filled 2022-12-16: qty 10

## 2022-12-16 MED ORDER — SODIUM CHLORIDE 0.9 % IV SOLN
10.0000 mg/kg | Freq: Once | INTRAVENOUS | Status: AC
  Administered 2022-12-16: 700 mg via INTRAVENOUS
  Filled 2022-12-16: qty 16

## 2022-12-16 NOTE — Patient Instructions (Signed)
Instrucciones al darle de alta: Discharge Instructions Gracias por elegir al [facility] de Cncer de [place] para brindarle atencin mdica de oncologa y Teacher, English as a foreign language.   Si usted tiene una cita de laboratorio con [employer] de [place], por favor vaya directamente al [employer] de Cncer y regstrese en el rea de Engineer, maintenance (IT).   Use ropa cmoda y [place] para tener fcil acceso a las vas del Portacath (acceso venoso de Set designer duracin) o la lnea PICC (catter central colocado por va perifrica).   Nos esforzamos por ofrecerle tiempo de calidad con su proveedor. Es posible que tenga que volver a programar su cita si llega tarde (15 minutos o ms).  El llegar tarde le afecta a usted y a otros pacientes cuyas citas son posteriores a Armed forces operational officer.  Adems, si usted falta a tres o ms citas sin avisar a la oficina, puede ser retirado(a) de la clnica a discrecin del proveedor.      Para las solicitudes de renovacin de recetas, pida a su farmacia que se ponga en contacto con nuestra oficina y deje que transcurran 72 horas para que se complete el proceso de las renovaciones.    Hoy usted recibi los siguientes agentes de quimioterapia e/o inmunoterapia: Bevacizumab/Irinotecan     Para ayudar a prevenir las nuseas y los vmitos despus de su tratamiento, le recomendamos que tome su medicamento para las nuseas segn las indicaciones.  LOS SNTOMAS QUE DEBEN COMUNICARSE INMEDIATAMENTE SE INDICAN A CONTINUACIN: *FIEBRE SUPERIOR A 100.4 F (38 C) O MS *ESCALOFROS O SUDORACIN *NUSEAS Y VMITOS QUE NO SE CONTROLAN CON EL MEDICAMENTO PARA LAS NUSEAS *DIFICULTAD INUSUAL PARA RESPIRAR  *MORETONES O HEMORRAGIAS NO HABITUALES *PROBLEMAS URINARIOS (dolor o ardor al Geographical information systems officer o frecuencia para Geographical information systems officer) *PROBLEMAS INTESTINALES (diarrea inusual, estreimiento, dolor cerca del ano) SENSIBILIDAD EN LA BOCA Y EN LA GARGANTA CON O SIN LA PRESENCIA DE LCERAS (dolor de garganta, llagas en la boca o dolor de  muelas/dientes) ERUPCIN, HINCHAZN O DOLORES INUSUALES FLUJO VAGINAL INUSUAL O PICAZN/RASQUIA    Los puntos marcados con un asterisco ( *) indican una posible emergencia y debe hacer un seguimiento tan pronto como le sea posible o vaya al Departamento de Emergencias si se le presenta algn problema.  Por favor, muestre la [place] DE ADVERTENCIA DE Marc Morgans DE ADVERTENCIA DE Gard[place] Fanti al registrarse en [address] de Emergencias y a la enfermera de triaje.  Si tiene preguntas despus de su visita o necesita cancelar o volver a programar su cita, por favor pngase en contacto con [facility]  Dept: [phone] y siga las instrucciones. Las horas de oficina son de 8:00 a.m. a 4:30 p.m. de lunes a viernes. Por favor, tenga en cuenta que los mensajes de voz que se dejan despus de las 4:00 p.m. posiblemente no se devolvern hasta el siguiente da de [place].  Cerramos los fines de semana y [employer]. En todo momento tiene acceso a una enfermera para preguntas urgentes. Por favor, llame al nmero principal de la clnica Dept: [phone] y siga las instrucciones.   Para cualquier pregunta que no sea de carcter urgente, tambin puede ponerse en contacto con su proveedor [employer]. Ahora ofrecemos visitas electrnicas para cualquier persona mayor de 18 aos que solicite atencin mdica en lnea para los sntomas que no sean urgentes. Para ms detalles vaya a [email or web address].   Tambin puede bajar la aplicacin de MyChart! Vaya a la tienda de aplicaciones, busque "MyChart", abra la aplicacin,  seleccione [place], e ingrese con su nombre de usuario y la contrasea de Pharmacist, community.

## 2022-12-16 NOTE — Progress Notes (Signed)
[facility] at [facility] [address]  [place], [place] [address] [phone]   Interval Evaluation  Date of Service: 12/16/22 [name] Patient MRN: [id number] Patient DOB: [birth year] Provider: Henreitta Leber, MD  Identifying Statement:  Hunter Terry is a 62 y.o. male with left temporal glioblastoma   Oncologic History: Oncology History  Glioblastoma, IDH-wildtype (HCC)  06/15/2020 Surgery   Stereotactic biopsy, L temporal Hunter Terry).  Path demonstrates glioblastoma IDH-1 wt   08/02/2020 Surgery   Debulking craniotomy with Dr. [name]   09/05/2020 - 10/17/2020 Radiation Therapy   IMRT with concurrent Temozolomide Hunter Terry)   11/19/2020 - 06/18/2021 Chemotherapy   Completes #7 cycles adjuvant 5-day Temozolomide       07/17/2021 Progression   Progression of disease #1   07/18/2021 - 07/10/2022 Chemotherapy   Completes 8 cycles of CCNU 90mg /m2 PO q6 weeks and Avastin 10mg /kg IV q2 weeks   07/11/2022 Progression   Progression of disease #2   09/05/2022 Surgery   Repeat left temporal craniotomy with Dr. [name]; path is Glioblastoma IDHwt   09/19/2022 -  Chemotherapy   Initiates metronomic Temozolomide, 50mg /m2 PO daily   09/23/2022 - 09/23/2022 Chemotherapy   Patient is on Treatment Plan : BRAIN Low Grade Glioma Grade II, Glioblastoma,  Astrocytoma, Oligodendroma, Recurrent or Progressive / Temozolomide D1-5 Q28 Days     11/04/2022 -  Chemotherapy   Patient is on Treatment Plan : BRAIN GBM [facility] Irinotecan D1,15 + Bevacizumab D1,15 q 28d       Biomarkers:  MGMT Unknown.  IDH 1/2 Wild type.  EGFR Unknown  TERT "Mutated   Interval History:  Hunter Terry presents today for next infusion of irinotecan and avastin.  He has now completed antibiotics for strep throat infection.  Symptoms remain improved.  No new or progressive neurologic changes today.  No balance issues, motor dysfunction.  Continues on Keppra 750mg  BID, no  headaches.  H+P (07/24/20) Patient presented to medical attention in late October, 2021 with new onset seizure.  Event was decribed as loss of consciousness, sudden, without clarity on further details aside from altered mental status upon awakening.  CNS imaging demonsrated non-enhancing mass within left temporal lobe c/w likely glioma; he underwent stereotactic biopsy with Dr. [name] on 06/15/20.  There was significant delay in finalizing path, explaining delay in follow up and evaluation.  He denies any seizures since discharge from hospital, on 4 anti-seizure drugs.  He does complain of dizziness and drowsiness with the medications, however.  Also describes impaired short term memory.  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    Medications: Current Outpatient Medications on File Prior to Visit  Medication Sig Dispense Refill   acetaminophen (TYLENOL) 500 MG tablet Take 500 mg by mouth every 6 (six) hours as needed for moderate pain. (Patient not taking: Reported on 12/02/2022)     amoxicillin (AMOXIL) 500 MG capsule Take 1 capsule (500 mg total) by mouth 2 (two) times daily. 20 capsule 0   levETIRAcetam (KEPPRA) 750 MG tablet Take 1 tablet (750 mg total) by mouth 2 (two) times daily. 60 tablet 3   loperamide (IMODIUM) 2 MG capsule Take 2 tabs by mouth with first loose stool, then 1 tab with each additional loose stool as needed. Do not exceed 8 tabs in a 24-hour period (Patient not taking: Reported on 12/02/2022) 30 capsule 1   Multiple Vitamin (MULTIVITAMIN WITH MINERALS) TABS tablet Take 1 tablet by mouth daily.  ondansetron (ZOFRAN) 8 MG tablet Take 1 tablet (8 mg total) by mouth every 8 (eight) hours as needed for nausea, vomiting or refractory nausea / vomiting. Start on the third day after chemotherapy. (Patient not taking: Reported on 12/02/2022) 30 tablet 1   [DISCONTINUED] lacosamide (VIMPAT) 200 MG TABS tablet Take 0.5 tablets (100 mg total) by mouth 2 (two) times daily. 30 tablet  0   No current facility-administered medications on file prior to visit.    Allergies: No Known Allergies Past Medical History:  Past Medical History:  Diagnosis Date   Cancer (HCC)    BRAIN TUMOR   Headache    Seizure [facility])    Past Surgical History:  Past Surgical History:  Procedure Laterality Date   APPLICATION OF CRANIAL NAVIGATION N/A 06/15/2020   Procedure: APPLICATION OF CRANIAL NAVIGATION;  Surgeon: Bedelia Person, MD;  Location: [facility] OR;  Service: Neurosurgery;  Laterality: N/A;   APPLICATION OF CRANIAL NAVIGATION N/A 08/02/2020   Procedure: APPLICATION OF CRANIAL NAVIGATION;  Surgeon: Bedelia Person, MD;  Location: [facility] OR;  Service: Neurosurgery;  Laterality: N/A;   APPLICATION OF CRANIAL NAVIGATION Left 09/05/2022   Procedure: APPLICATION OF CRANIAL NAVIGATION;  Surgeon: Bedelia Person, MD;  Location[facility] OR;  Service: Neurosurgery;  Laterality: Left;   CRANIOTOMY N/A 08/02/2020   Procedure: CRANIOTOMY LEFT TEMPORAL LOBECTOMY FOR TUMOR EXCISION;  Surgeon: Bedelia Person, MD;  Location: [facility] OR;  Service: Neurosurgery;  Laterality: N/A;   CRANIOTOMY Left 09/05/2022   Procedure: CRANIOTOMY FOR RESECTION OF TEMPORAL LOBE TUMOR;  Surgeon: Bedelia Person, MD;  Location: [facility] OR;  Service: Neurosurgery;  Laterality: Left;   FRAMELESS  BIOPSY WITH BRAINLAB Left 06/15/2020   Procedure: Left temporal lobe stereotactic brain biopsy with brainlab;  Surgeon: Bedelia Person, MD;  Location: [facility] OR;  Service: Neurosurgery;  Laterality: Left;   Social History:  Social History   Socioeconomic History   Marital status: Married    Spouse name: Not on file   Number of children: Not on file   Years of education: Not on file   Highest education level: Not on file  Occupational History   Not on file  Tobacco Use   Smoking status: Never   Smokeless tobacco: Never  Vaping Use   Vaping Use: Never used  Substance and Sexual Activity   Alcohol use: Yes    Comment: 1 beer a  month   Drug use: Never   Sexual activity: Yes    Partners: Female  Other Topics Concern   Not on file  Social History Narrative   Not on file   Social Determinants of Health   Financial Resource Strain: Not on file  Food Insecurity: Not on file  Transportation Needs: Not on file  Physical Activity: Not on file  Stress: Not on file  Social Connections: Not on file  Intimate Partner Violence: Not on file   Family History:  Family History  Problem Relation Age of Onset   Cancer Neg Hx     Review of Systems: Constitutional: Doesn't report fevers, chills or abnormal weight loss Eyes: Doesn't report blurriness of vision Ears, nose, mouth, throat, and face: Doesn't report sore throat Respiratory: Doesn't report cough, dyspnea or wheezes Cardiovascular: Doesn't report palpitation, chest discomfort  Gastrointestinal:  Doesn't report nausea, constipation, diarrhea GU: Doesn't report incontinence Skin: Doesn't report skin rashes Neurological: Per HPI Musculoskeletal: Doesn't report joint pain Behavioral/Psych: Doesn't report anxiety  Physical Exam: Vitals:   12/16/22 1056  BP:  112/64  Pulse: 70  Resp: 18  Temp: 98.4 F (36.9 C)  SpO2: 100%   KPS: 80. General: Alert, cooperative, pleasant, in no acute distress Head: Normal EENT: No conjunctival injection or scleral icterus.  Lungs: Resp effort normal Cardiac: Regular rate Abdomen: Non-distended abdomen Skin: No rashes cyanosis or petechiae. Extremities: No clubbing or edema  Neurologic Exam: Mental Status: Awake, alert, attentive to examiner. Oriented to self and environment. Language has mildly impaired fluency with intact comprehension.  Cranial Nerves: Visual acuity is grossly normal. Visual fields are full. Extra-ocular movements intact. No ptosis. Face is symmetric Motor: Tone and bulk are normal. Power is full in both arms and legs. Reflexes are symmetric, no pathologic reflexes present.  Sensory: Intact to  light touch Gait: Normal.   Labs: I have reviewed the data as listed    Component Value Date/Time   NA 137 12/02/2022 1113   K 4.4 12/02/2022 1113   CL 102 12/02/2022 1113   CO2 30 12/02/2022 1113   GLUCOSE 125 (H) 12/02/2022 1113   BUN 11 12/02/2022 1113   CREATININE 0.69 12/02/2022 1113   CALCIUM 9.5 12/02/2022 1113   PROT 7.3 12/02/2022 1113   ALBUMIN 3.7 12/02/2022 1113   AST 50 (H) 12/02/2022 1113   ALT 76 (H) 12/02/2022 1113   ALKPHOS 429 (H) 12/02/2022 1113   BILITOT 0.5 12/02/2022 1113   GFRNONAA >60 12/02/2022 1113   Lab Results  Component Value Date   WBC 3.9 (L) 12/16/2022   NEUTROABS 2.1 12/16/2022   HGB 14.1 12/16/2022   HCT 40.2 12/16/2022   MCV 90.7 12/16/2022   PLT 229 12/16/2022    Assessment/Plan 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  Labs are improved today.  Will recommend proceeding with cycle #2, day 1 of Irinotecan 125mg /m2 IV q2 weeks, with concurrent Avastin 10mg /kg IV q2 weeks.  We reviewed side effects, including diarrhea.  Avastin has been previously well tolerated.  Imodium will be provided for use at home if needed.  Chemotherapy should be held for the following:  ANC less than 1,000  Platelets less than 100,000  LFT or creatinine greater than 2x ULN  If clinical concerns/contraindications develop  Avastin should be held for the following:  ANC less than 500  Platelets less than 50,000  LFT or creatinine greater than 2x ULN  If clinical concerns/contraindications develop  Keppra will stay at 750mg  BID for now.  Recommended nocturnal humidification for nasopharyngeal mucosal irritation from avastin.  We appreciate the opportunity to participate in the care of [facility].     We ask that Huntlee Ovitt return in 2 weeks with MRI brain for evaluation prior to cycle 2, day 15 CPT-11 and avastin, or sooner as needed.  All questions were answered. The patient knows to call  the clinic with any problems, questions or concerns. No barriers to learning were detected.  The total time spent in the encounter was 30 minutes and more than 50% was on counseling and review of test results   Henreitta Leber, MD Medical Director of Neuro-Oncology [facility] at [facility] 12/16/22 10:53 AM

## 2022-12-18 ENCOUNTER — Other Ambulatory Visit: Payer: Self-pay

## 2022-12-20 ENCOUNTER — Telehealth: Payer: Self-pay | Admitting: Internal Medicine

## 2022-12-20 NOTE — Telephone Encounter (Signed)
Scheduled per 05/06 los, patient has been called and notified. 

## 2022-12-24 ENCOUNTER — Other Ambulatory Visit: Payer: Self-pay

## 2022-12-26 ENCOUNTER — Ambulatory Visit (HOSPITAL_COMMUNITY)
Admission: RE | Admit: 2022-12-26 | Discharge: 2022-12-26 | Disposition: A | Discharge status: Home / Self Care | Payer: Medicare Other | Source: Ambulatory Visit | Attending: Internal Medicine | Admitting: Internal Medicine

## 2022-12-26 DIAGNOSIS — C719 Malignant neoplasm of brain, unspecified: Secondary | ICD-10-CM | POA: Insufficient documentation

## 2022-12-26 MED ORDER — GADOBUTROL 1 MMOL/ML IV SOLN
7.0000 mL | Freq: Once | INTRAVENOUS | Status: AC | PRN
  Administered 2022-12-26: 7 mL via INTRAVENOUS

## 2022-12-27 ENCOUNTER — Other Ambulatory Visit: Payer: Self-pay | Admitting: *Deleted

## 2022-12-27 DIAGNOSIS — C719 Malignant neoplasm of brain, unspecified: Secondary | ICD-10-CM

## 2022-12-27 DIAGNOSIS — R569 Unspecified convulsions: Secondary | ICD-10-CM

## 2022-12-30 ENCOUNTER — Inpatient Hospital Stay: Payer: Medicare Other

## 2022-12-30 ENCOUNTER — Inpatient Hospital Stay (HOSPITAL_BASED_OUTPATIENT_CLINIC_OR_DEPARTMENT_OTHER): Payer: Medicare Other | Admitting: Internal Medicine

## 2022-12-30 ENCOUNTER — Other Ambulatory Visit: Payer: Self-pay

## 2022-12-30 VITALS — BP 117/64 | HR 56

## 2022-12-30 VITALS — BP 116/72 | HR 59 | Temp 98.3°F | Resp 17 | Ht 66.0 in | Wt 150.9 lb

## 2022-12-30 DIAGNOSIS — R569 Unspecified convulsions: Secondary | ICD-10-CM

## 2022-12-30 DIAGNOSIS — C719 Malignant neoplasm of brain, unspecified: Secondary | ICD-10-CM

## 2022-12-30 DIAGNOSIS — Z5111 Encounter for antineoplastic chemotherapy: Secondary | ICD-10-CM | POA: Diagnosis not present

## 2022-12-30 LAB — CBC WITH DIFFERENTIAL (CANCER CENTER ONLY)
Abs Immature Granulocytes: 0.01 10*3/uL (ref 0.00–0.07)
Basophils Absolute: 0 10*3/uL (ref 0.0–0.1)
Basophils Relative: 0 %
Eosinophils Absolute: 0.1 10*3/uL (ref 0.0–0.5)
Eosinophils Relative: 3 %
HCT: 40.3 % (ref 39.0–52.0)
Hemoglobin: 13.5 g/dL (ref 13.0–17.0)
Immature Granulocytes: 0 %
Lymphocytes Relative: 27 %
Lymphs Abs: 1 10*3/uL (ref 0.7–4.0)
MCH: 31.1 pg (ref 26.0–34.0)
MCHC: 33.5 g/dL (ref 30.0–36.0)
MCV: 92.9 fL (ref 80.0–100.0)
Monocytes Absolute: 0.5 10*3/uL (ref 0.1–1.0)
Monocytes Relative: 12 %
Neutro Abs: 2.2 10*3/uL (ref 1.7–7.7)
Neutrophils Relative %: 58 %
Platelet Count: 199 10*3/uL (ref 150–400)
RBC: 4.34 MIL/uL (ref 4.22–5.81)
RDW: 14.7 % (ref 11.5–15.5)
WBC Count: 3.8 10*3/uL — ABNORMAL LOW (ref 4.0–10.5)
nRBC: 0 % (ref 0.0–0.2)

## 2022-12-30 LAB — CMP (CANCER CENTER ONLY)
ALT: 28 U/L (ref 0–44)
AST: 24 U/L (ref 15–41)
Albumin: 4.1 g/dL (ref 3.5–5.0)
Alkaline Phosphatase: 179 U/L — ABNORMAL HIGH (ref 38–126)
Anion gap: 5 (ref 5–15)
BUN: 10 mg/dL (ref 8–23)
CO2: 29 mmol/L (ref 22–32)
Calcium: 9.2 mg/dL (ref 8.9–10.3)
Chloride: 104 mmol/L (ref 98–111)
Creatinine: 0.75 mg/dL (ref 0.61–1.24)
GFR, Estimated: >60 mL/min (ref >60)
Glucose, Bld: 85 mg/dL (ref 70–99)
Potassium: 4.3 mmol/L (ref 3.5–5.1)
Sodium: 138 mmol/L (ref 135–145)
Total Bilirubin: 0.6 mg/dL (ref 0.3–1.2)
Total Protein: 7.2 g/dL (ref 6.5–8.1)

## 2022-12-30 LAB — TOTAL PROTEIN, URINE DIPSTICK: Protein, ur: NEGATIVE mg/dL

## 2022-12-30 MED ORDER — SODIUM CHLORIDE 0.9 % IV SOLN
10.0000 mg | Freq: Once | INTRAVENOUS | Status: AC
  Administered 2022-12-30: 10 mg via INTRAVENOUS
  Filled 2022-12-30: qty 10

## 2022-12-30 MED ORDER — LEVETIRACETAM 750 MG PO TABS: 750.0000 mg | ORAL_TABLET | Freq: Two times a day (BID) | ORAL | 3 refills | Status: DC

## 2022-12-30 MED ORDER — SODIUM CHLORIDE 0.9 % IV SOLN
10.0000 mg/kg | Freq: Once | INTRAVENOUS | Status: AC
  Administered 2022-12-30: 700 mg via INTRAVENOUS
  Filled 2022-12-30: qty 16

## 2022-12-30 MED ORDER — IRINOTECAN HCL CHEMO INJECTION 100 MG/5ML
125.0000 mg/m2 | Freq: Once | INTRAVENOUS | Status: AC
  Administered 2022-12-30: 220 mg via INTRAVENOUS
  Filled 2022-12-30: qty 11

## 2022-12-30 MED ORDER — SODIUM CHLORIDE 0.9 % IV SOLN: Freq: Once | INTRAVENOUS | Status: AC

## 2022-12-30 MED ORDER — PALONOSETRON HCL INJECTION 0.25 MG/5ML
0.2500 mg | Freq: Once | INTRAVENOUS | Status: AC
  Administered 2022-12-30: 0.25 mg via INTRAVENOUS
  Filled 2022-12-30: qty 5

## 2022-12-30 MED ORDER — ATROPINE SULFATE 1 MG/ML IV SOLN
0.5000 mg | Freq: Once | INTRAVENOUS | Status: AC | PRN
  Administered 2022-12-30: 0.5 mg via INTRAVENOUS
  Filled 2022-12-30: qty 1

## 2022-12-30 NOTE — Progress Notes (Signed)
[facility] [address]  [place], [place] [address] [phone]   Interval Evaluation  Date of Service: 12/30/22 [name] Patient MRN: [id number] Patient DOB: 1960/12/15 Provider: Henreitta Leber, MD  Identifying Statement:  Hunter Terry is a 62 y.o. male with left temporal glioblastoma   Oncologic History: Oncology History  Glioblastoma, IDH-wildtype (HCC)  06/15/2020 Surgery   Stereotactic biopsy, L temporal Maisie Fus).  Path demonstrates glioblastoma IDH-1 wt   08/02/2020 Surgery   Debulking craniotomy with Dr. Maisie Fus   09/05/2020 - 10/17/2020 Radiation Therapy   IMRT with concurrent Temozolomide [name])   11/19/2020 - 06/18/2021 Chemotherapy   Completes #7 cycles adjuvant 5-day Temozolomide       07/17/2021 Progression   Progression of disease #1   07/18/2021 - 07/10/2022 Chemotherapy   Completes 8 cycles of CCNU 90mg /m2 PO q6 weeks and Avastin 10mg /kg IV q2 weeks   07/11/2022 Progression   Progression of disease #2   09/05/2022 Surgery   Repeat left temporal craniotomy with Dr. Maisie Fus; path is Glioblastoma IDHwt   09/19/2022 -  Chemotherapy   Initiates metronomic Temozolomide, 50mg /m2 PO daily   09/23/2022 - 09/23/2022 Chemotherapy   Patient is on Treatment Plan : BRAIN Low Grade Glioma Grade II, Glioblastoma,  Astrocytoma, Oligodendroma, Recurrent or Progressive / Temozolomide D1-5 Q28 Days     11/04/2022 -  Chemotherapy   Patient is on Treatment Plan : BRAIN GBM [facility] Irinotecan D1,15 + Bevacizumab D1,15 q 28d       Biomarkers:  MGMT Unknown.  IDH 1/2 Wild type.  EGFR Unknown  TERT "Mutated   Interval History: Hunter Terry presents today following recent MRI brain.  He feels well today.  No new or progressive neurologic changes today.  No balance issues, motor dysfunction.  Continues on Keppra 750mg  BID, no headaches.  H+P (07/24/20) Patient presented to medical attention in late October, 2021  with new onset seizure.  Event was decribed as loss of consciousness, sudden, without clarity on further details aside from altered mental status upon awakening.  CNS imaging demonsrated non-enhancing mass within left temporal lobe c/w likely glioma; he underwent stereotactic biopsy with Dr. Maisie Fus on 06/15/20.  There was significant delay in finalizing path, explaining delay in follow up and evaluation.  He denies any seizures since discharge from hospital, on 4 anti-seizure drugs.  He does complain of dizziness and drowsiness with the medications, however.  Also describes impaired short term memory.  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    Medications: Current Outpatient Medications on File Prior to Visit  Medication Sig Dispense Refill   levETIRAcetam (KEPPRA) 750 MG tablet Take 1 tablet (750 mg total) by mouth 2 (two) times daily. 60 tablet 3   Multiple Vitamin (MULTIVITAMIN WITH MINERALS) TABS tablet Take 1 tablet by mouth daily.     acetaminophen (TYLENOL) 500 MG tablet Take 500 mg by mouth every 6 (six) hours as needed for moderate pain. (Patient not taking: Reported on 12/02/2022)     loperamide (IMODIUM) 2 MG capsule Take 2 tabs by mouth with first loose stool, then 1 tab with each additional loose stool as needed. Do not exceed 8 tabs in a 24-hour period (Patient not taking: Reported on 12/02/2022) 30 capsule 1   ondansetron (ZOFRAN) 8 MG tablet Take 1 tablet (8 mg total) by mouth every 8 (eight) hours as needed for nausea, vomiting or refractory nausea / vomiting. Start on the third day after chemotherapy. (  Patient not taking: Reported on 12/02/2022) 30 tablet 1   [DISCONTINUED] lacosamide (VIMPAT) 200 MG TABS tablet Take 0.5 tablets (100 mg total) by mouth 2 (two) times daily. 30 tablet 0   No current facility-administered medications on file prior to visit.    Allergies: No Known Allergies Past Medical History:  Past Medical History:  Diagnosis Date   Cancer (HCC)    BRAIN  TUMOR   Headache    Seizure [facility])    Past Surgical History:  Past Surgical History:  Procedure Laterality Date   APPLICATION OF CRANIAL NAVIGATION N/A 06/15/2020   Procedure: APPLICATION OF CRANIAL NAVIGATION;  Surgeon: Bedelia Person, MD;  [facility] OR;  Service: Neurosurgery;  Laterality: N/A;   APPLICATION OF CRANIAL NAVIGATION N/A 08/02/2020   Procedure: APPLICATION OF CRANIAL NAVIGATION;  Surgeon: Bedelia Person, MD;  Location: [facility] OR;  Service: Neurosurgery;  Laterality: N/A;   APPLICATION OF CRANIAL NAVIGATION Left 09/05/2022   Procedure: APPLICATION OF CRANIAL NAVIGATION;  Surgeon: Bedelia Person, MD;  Location: [facility] OR;  Service: Neurosurgery;  Laterality: Left;   CRANIOTOMY N/A 08/02/2020   Procedure: CRANIOTOMY LEFT TEMPORAL LOBECTOMY FOR TUMOR EXCISION;  Surgeon: Bedelia Person, MD;  Location: [facility] OR;  Service: Neurosurgery;  Laterality: N/A;   CRANIOTOMY Left 09/05/2022   Procedure: CRANIOTOMY FOR RESECTION OF TEMPORAL LOBE TUMOR;  Surgeon: Bedelia Person, MD;  Location: [facility] OR;  Service: Neurosurgery;  Laterality: Left;   FRAMELESS  BIOPSY WITH BRAINLAB Left 06/15/2020   Procedure: Left temporal lobe stereotactic brain biopsy with brainlab;  Surgeon: Bedelia Person, MD;  Location: [facility] OR;  Service: Neurosurgery;  Laterality: Left;   Social History:  Social History   Socioeconomic History   Marital status: Married    Spouse name: Not on file   Number of children: Not on file   Years of education: Not on file   Highest education level: Not on file  Occupational History   Not on file  Tobacco Use   Smoking status: Never   Smokeless tobacco: Never  Vaping Use   Vaping Use: Never used  Substance and Sexual Activity   Alcohol use: Yes    Comment: 1 beer a month   Drug use: Never   Sexual activity: Yes    Partners: Female  Other Topics Concern   Not on file  Social History Narrative   Not on file   Social Determinants of Health   Financial  Resource Strain: Not on file  Food Insecurity: Not on file  Transportation Needs: Not on file  Physical Activity: Not on file  Stress: Not on file  Social Connections: Not on file  Intimate Partner Violence: Not on file   Family History:  Family History  Problem Relation Age of Onset   Cancer Neg Hx     Review of Systems: Constitutional: Doesn't report fevers, chills or abnormal weight loss Eyes: Doesn't report blurriness of vision Ears, nose, mouth, throat, and face: Doesn't report sore throat Respiratory: Doesn't report cough, dyspnea or wheezes Cardiovascular: Doesn't report palpitation, chest discomfort  Gastrointestinal:  Doesn't report nausea, constipation, diarrhea GU: Doesn't report incontinence Skin: Doesn't report skin rashes Neurological: Per HPI Musculoskeletal: Doesn't report joint pain Behavioral/Psych: Doesn't report anxiety  Physical Exam: Vitals:   12/30/22 1103  BP: 116/72  Pulse: (!) 59  Resp: 17  Temp: 98.3 F (36.8 C)  SpO2: 100%   KPS: 80. General: Alert, cooperative, pleasant, in no acute distress Head: Normal EENT: No conjunctival  injection or scleral icterus.  Lungs: Resp effort normal Cardiac: Regular rate Abdomen: Non-distended abdomen Skin: No rashes cyanosis or petechiae. Extremities: No clubbing or edema  Neurologic Exam: Mental Status: Awake, alert, attentive to examiner. Oriented to self and environment. Language has mildly impaired fluency with intact comprehension.  Cranial Nerves: Visual acuity is grossly normal. Visual fields are full. Extra-ocular movements intact. No ptosis. Face is symmetric Motor: Tone and bulk are normal. Power is full in both arms and legs. Reflexes are symmetric, no pathologic reflexes present.  Sensory: Intact to light touch Gait: Normal.   Labs: I have reviewed the data as listed    Component Value Date/Time   NA 138 12/30/2022 1014   K 4.3 12/30/2022 1014   CL 104 12/30/2022 1014   CO2 29  12/30/2022 1014   GLUCOSE 85 12/30/2022 1014   BUN 10 12/30/2022 1014   CREATININE 0.75 12/30/2022 1014   CALCIUM 9.2 12/30/2022 1014   PROT 7.2 12/30/2022 1014   ALBUMIN 4.1 12/30/2022 1014   AST 24 12/30/2022 1014   ALT 28 12/30/2022 1014   ALKPHOS 179 (H) 12/30/2022 1014   BILITOT 0.6 12/30/2022 1014   GFRNONAA >60 12/30/2022 1014   Lab Results  Component Value Date   WBC 3.8 (L) 12/30/2022   NEUTROABS 2.2 12/30/2022   HGB 13.5 12/30/2022   HCT 40.3 12/30/2022   MCV 92.9 12/30/2022   PLT 199 12/30/2022   Imaging:  [facility] Clinician Interpretation: I have personally reviewed the CNS images as listed.  My interpretation, in the context of the patient's clinical presentation, is stable disease pending official read  No results found.  Assessment/Plan Glioblastoma, IDH-wildtype (HCC) [C71.9]  Hunter Terry presents today for CPT-11 and Avastin.  MRI brain demonstrates overall stable findings, with decreased burden of enhancement and FLAIR signal abnormality compared to previous study.  Will recommend proceeding with cycle #3, day 1 of Irinotecan 125mg /m2 IV q2 weeks, with concurrent Avastin 10mg /kg IV q2 weeks.  We reviewed side effects, including diarrhea.  Avastin has been previously well tolerated.  Imodium will be provided for use at home if needed.  Chemotherapy should be held for the following:  ANC less than 1,000  Platelets less than 100,000  LFT or creatinine greater than 2x ULN  If clinical concerns/contraindications develop  Avastin should be held for the following:  ANC less than 500  Platelets less than 50,000  LFT or creatinine greater than 2x ULN  If clinical concerns/contraindications develop  Keppra will stay at 750mg  BID for now.  Recommended nocturnal humidification for nasopharyngeal mucosal irritation from avastin.  We appreciate the opportunity to participate in the care of Hunter Terry.     We ask that Hunter Terry return in 2 weeks  with labs for evaluation prior to cycle 3, day 15 CPT-11 and avastin, or sooner as needed.  All questions were answered. The patient knows to call the clinic with any problems, questions or concerns. No barriers to learning were detected.  The total time spent in the encounter was 30 minutes and more than 50% was on counseling and review of test results   Henreitta Leber, MD Medical Director of Neuro-Oncology [facility] at [place] [facility] 12/30/22 11:06 AM

## 2022-12-30 NOTE — Patient Instructions (Signed)
Instrucciones al darle de alta: Discharge Instructions Gracias por elegir al [facility] de Cncer de [place] para brindarle atencin mdica de oncologa y Teacher, English as a foreign language.   Si usted tiene una cita de laboratorio con [employer] de [place], por favor vaya directamente al [employer] de Cncer y regstrese en el rea de Engineer, maintenance (IT).   Use ropa cmoda y [place] para tener fcil acceso a las vas del Portacath (acceso venoso de Set designer duracin) o la lnea PICC (catter central colocado por va perifrica).   Nos esforzamos por ofrecerle tiempo de calidad con su proveedor. Es posible que tenga que volver a programar su cita si llega tarde (15 minutos o ms).  El llegar tarde le afecta a usted y a otros pacientes cuyas citas son posteriores a Armed forces operational officer.  Adems, si usted falta a tres o ms citas sin avisar a la oficina, puede ser retirado(a) de la clnica a discrecin del proveedor.      Para las solicitudes de renovacin de recetas, pida a su farmacia que se ponga en contacto con nuestra oficina y deje que transcurran 72 horas para que se complete el proceso de las renovaciones.    Hoy usted recibi los siguientes agentes de quimioterapia e/o inmunoterapia: Civil Service fast streamer, Camptosar      Para ayudar a prevenir las nuseas y los vmitos despus de su tratamiento, le recomendamos que tome su medicamento para las nuseas segn las indicaciones.  LOS SNTOMAS QUE DEBEN COMUNICARSE INMEDIATAMENTE SE INDICAN A CONTINUACIN: *FIEBRE SUPERIOR A 100.4 F (38 C) O MS *ESCALOFROS O SUDORACIN *NUSEAS Y VMITOS QUE NO SE CONTROLAN CON EL MEDICAMENTO PARA LAS NUSEAS *DIFICULTAD INUSUAL PARA RESPIRAR  *MORETONES O HEMORRAGIAS NO HABITUALES *PROBLEMAS URINARIOS (dolor o ardor al Geographical information systems officer o frecuencia para Geographical information systems officer) *PROBLEMAS INTESTINALES (diarrea inusual, estreimiento, dolor cerca del ano) SENSIBILIDAD EN LA BOCA Y EN LA GARGANTA CON O SIN LA PRESENCIA DE LCERAS (dolor de garganta, llagas en la boca o dolor de  muelas/dientes) ERUPCIN, HINCHAZN O DOLORES INUSUALES FLUJO VAGINAL INUSUAL O PICAZN/RASQUIA    Los puntos marcados con un asterisco ( *) indican una posible emergencia y debe hacer un seguimiento tan pronto como le sea posible o vaya al Departamento de Emergencias si se le presenta algn problema.  Por favor, muestre la [place] DE ADVERTENCIA DE Marc Morgans DE ADVERTENCIA DE Gardiner Fanti al registrarse en [address] de Emergencias y a la enfermera de triaje.  Si tiene preguntas despus de su visita o necesita cancelar o volver a programar su cita, por favor pngase en contacto con [place]  Dept: [phone] y siga las instrucciones. Las horas de oficina son de 8:00 a.m. a 4:30 p.m. de lunes a viernes. Por favor, tenga en cuenta que los mensajes de voz que se dejan despus de las 4:00 p.m. posiblemente no se devolvern hasta el siguiente da de [place].  Cerramos los fines de semana y [employer]. En todo momento tiene acceso a una enfermera para preguntas urgentes. Por favor, llame al nmero principal de la clnica Dept: [phone] y siga las instrucciones.   Para cualquier pregunta que no sea de carcter urgente, tambin puede ponerse en contacto con su proveedor [employer]. Ahora ofrecemos visitas electrnicas para cualquier persona mayor de 18 aos que solicite atencin mdica en lnea para los sntomas que no sean urgentes. Para ms detalles vaya a [email or web address].   Tambin puede bajar la aplicacin de MyChart! Vaya a la tienda de aplicaciones, busque "MyChart", abra  la aplicacin, seleccione [employer], e ingrese con su nombre de usuario y la contrasea de Clinical cytogeneticist.

## 2022-12-31 ENCOUNTER — Other Ambulatory Visit: Payer: Self-pay

## 2022-12-31 ENCOUNTER — Telehealth: Payer: Self-pay | Admitting: Internal Medicine

## 2022-12-31 NOTE — Telephone Encounter (Signed)
Scheduled per 05/20 los, patient has been called and notified. 

## 2023-01-01 ENCOUNTER — Other Ambulatory Visit: Payer: Self-pay

## 2023-01-10 MED FILL — Dexamethasone Sodium Phosphate Inj 100 MG/10ML: INTRAMUSCULAR | Qty: 1 | Status: AC

## 2023-01-11 ENCOUNTER — Encounter: Payer: Self-pay | Admitting: Internal Medicine

## 2023-01-13 ENCOUNTER — Inpatient Hospital Stay: Payer: Medicare Other | Attending: Internal Medicine

## 2023-01-13 ENCOUNTER — Other Ambulatory Visit: Payer: Self-pay

## 2023-01-13 ENCOUNTER — Inpatient Hospital Stay: Payer: Medicare Other

## 2023-01-13 ENCOUNTER — Inpatient Hospital Stay (HOSPITAL_BASED_OUTPATIENT_CLINIC_OR_DEPARTMENT_OTHER): Payer: Medicare Other | Admitting: Internal Medicine

## 2023-01-13 VITALS — BP 110/63 | HR 56 | Resp 18

## 2023-01-13 VITALS — BP 108/56 | HR 67 | Temp 97.7°F | Resp 16 | Wt 154.7 lb

## 2023-01-13 DIAGNOSIS — J00 Acute nasopharyngitis [common cold]: Secondary | ICD-10-CM | POA: Insufficient documentation

## 2023-01-13 DIAGNOSIS — C712 Malignant neoplasm of temporal lobe: Secondary | ICD-10-CM | POA: Insufficient documentation

## 2023-01-13 DIAGNOSIS — Z5111 Encounter for antineoplastic chemotherapy: Secondary | ICD-10-CM | POA: Diagnosis present

## 2023-01-13 DIAGNOSIS — C719 Malignant neoplasm of brain, unspecified: Secondary | ICD-10-CM

## 2023-01-13 DIAGNOSIS — R569 Unspecified convulsions: Secondary | ICD-10-CM | POA: Diagnosis not present

## 2023-01-13 DIAGNOSIS — D709 Neutropenia, unspecified: Secondary | ICD-10-CM | POA: Insufficient documentation

## 2023-01-13 DIAGNOSIS — Z9221 Personal history of antineoplastic chemotherapy: Secondary | ICD-10-CM | POA: Insufficient documentation

## 2023-01-13 DIAGNOSIS — Z923 Personal history of irradiation: Secondary | ICD-10-CM | POA: Diagnosis not present

## 2023-01-13 DIAGNOSIS — R42 Dizziness and giddiness: Secondary | ICD-10-CM | POA: Diagnosis not present

## 2023-01-13 DIAGNOSIS — Z79899 Other long term (current) drug therapy: Secondary | ICD-10-CM | POA: Insufficient documentation

## 2023-01-13 LAB — CMP (CANCER CENTER ONLY)
ALT: 51 U/L — ABNORMAL HIGH (ref 0–44)
AST: 37 U/L (ref 15–41)
Albumin: 4.1 g/dL (ref 3.5–5.0)
Alkaline Phosphatase: 200 U/L — ABNORMAL HIGH (ref 38–126)
Anion gap: 6 (ref 5–15)
BUN: 11 mg/dL (ref 8–23)
CO2: 30 mmol/L (ref 22–32)
Calcium: 9.3 mg/dL (ref 8.9–10.3)
Chloride: 104 mmol/L (ref 98–111)
Creatinine: 0.72 mg/dL (ref 0.61–1.24)
GFR, Estimated: >60 mL/min (ref >60)
Glucose, Bld: 93 mg/dL (ref 70–99)
Potassium: 3.9 mmol/L (ref 3.5–5.1)
Sodium: 140 mmol/L (ref 135–145)
Total Bilirubin: 0.6 mg/dL (ref 0.3–1.2)
Total Protein: 7.1 g/dL (ref 6.5–8.1)

## 2023-01-13 LAB — CBC WITH DIFFERENTIAL (CANCER CENTER ONLY)
Abs Immature Granulocytes: 0.01 10*3/uL (ref 0.00–0.07)
Basophils Absolute: 0 10*3/uL (ref 0.0–0.1)
Basophils Relative: 0 %
Eosinophils Absolute: 0.1 10*3/uL (ref 0.0–0.5)
Eosinophils Relative: 3 %
HCT: 38.5 % — ABNORMAL LOW (ref 39.0–52.0)
Hemoglobin: 13.1 g/dL (ref 13.0–17.0)
Immature Granulocytes: 0 %
Lymphocytes Relative: 32 %
Lymphs Abs: 0.8 10*3/uL (ref 0.7–4.0)
MCH: 31.3 pg (ref 26.0–34.0)
MCHC: 34 g/dL (ref 30.0–36.0)
MCV: 91.9 fL (ref 80.0–100.0)
Monocytes Absolute: 0.3 10*3/uL (ref 0.1–1.0)
Monocytes Relative: 12 %
Neutro Abs: 1.4 10*3/uL — ABNORMAL LOW (ref 1.7–7.7)
Neutrophils Relative %: 53 %
Platelet Count: 190 10*3/uL (ref 150–400)
RBC: 4.19 MIL/uL — ABNORMAL LOW (ref 4.22–5.81)
RDW: 14.6 % (ref 11.5–15.5)
WBC Count: 2.6 10*3/uL — ABNORMAL LOW (ref 4.0–10.5)
nRBC: 0 % (ref 0.0–0.2)

## 2023-01-13 LAB — TOTAL PROTEIN, URINE DIPSTICK: Protein, ur: NEGATIVE mg/dL

## 2023-01-13 MED ORDER — ATROPINE SULFATE 1 MG/ML IV SOLN
0.5000 mg | Freq: Once | INTRAVENOUS | Status: AC | PRN
  Administered 2023-01-13: 0.5 mg via INTRAVENOUS
  Filled 2023-01-13: qty 1

## 2023-01-13 MED ORDER — SODIUM CHLORIDE 0.9 % IV SOLN
10.0000 mg/kg | Freq: Once | INTRAVENOUS | Status: AC
  Administered 2023-01-13: 700 mg via INTRAVENOUS
  Filled 2023-01-13: qty 16

## 2023-01-13 MED ORDER — SODIUM CHLORIDE 0.9 % IV SOLN: Freq: Once | INTRAVENOUS | Status: AC

## 2023-01-13 MED ORDER — SODIUM CHLORIDE 0.9 % IV SOLN
125.0000 mg/m2 | Freq: Once | INTRAVENOUS | Status: AC
  Administered 2023-01-13: 220 mg via INTRAVENOUS
  Filled 2023-01-13: qty 11

## 2023-01-13 MED ORDER — PALONOSETRON HCL INJECTION 0.25 MG/5ML
0.2500 mg | Freq: Once | INTRAVENOUS | Status: AC
  Administered 2023-01-13: 0.25 mg via INTRAVENOUS
  Filled 2023-01-13: qty 5

## 2023-01-13 MED ORDER — SODIUM CHLORIDE 0.9 % IV SOLN
10.0000 mg | Freq: Once | INTRAVENOUS | Status: AC
  Administered 2023-01-13: 10 mg via INTRAVENOUS
  Filled 2023-01-13: qty 10

## 2023-01-13 NOTE — Progress Notes (Signed)
Ok to treat with ANC 1.4 K/uL per Dr Barbaraann Cao

## 2023-01-13 NOTE — Progress Notes (Signed)
[facility] [address]  [place], [place] [address] [phone]   Interval Evaluation  Date of Service: 01/13/23 [name] Patient MRN: [id number] Patient DOB: [birth year] Provider: Henreitta Leber, MD  Identifying Statement:  Hunter Terry is a 62 y.o. male with left temporal glioblastoma   Oncologic History: Oncology History  Glioblastoma, IDH-wildtype (HCC)  06/15/2020 Surgery   Stereotactic biopsy, L temporal Maisie Fus).  Path demonstrates glioblastoma IDH-1 wt   08/02/2020 Surgery   Debulking craniotomy with Dr. Maisie Fus   09/05/2020 - 10/17/2020 Radiation Therapy   IMRT with concurrent Temozolomide [name])   11/19/2020 - 06/18/2021 Chemotherapy   Completes #7 cycles adjuvant 5-day Temozolomide       07/17/2021 Progression   Progression of disease #1   07/18/2021 - 07/10/2022 Chemotherapy   Completes 8 cycles of CCNU 90mg /m2 PO q6 weeks and Avastin 10mg /kg IV q2 weeks   07/11/2022 Progression   Progression of disease #2   09/05/2022 Surgery   Repeat left temporal craniotomy with Dr. Maisie Fus; path is Glioblastoma IDHwt   09/19/2022 -  Chemotherapy   Initiates metronomic Temozolomide, 50mg /m2 PO daily   09/23/2022 - 09/23/2022 Chemotherapy   Patient is on Treatment Plan : BRAIN Low Grade Glioma Grade II, Glioblastoma,  Astrocytoma, Oligodendroma, Recurrent or Progressive / Temozolomide D1-5 Q28 Days     11/04/2022 -  Chemotherapy   Patient is on Treatment Plan : BRAIN GBM [facility] Irinotecan D1,15 + Bevacizumab D1,15 q 28d       Biomarkers:  MGMT Unknown.  IDH 1/2 Wild type.  EGFR Unknown  TERT "Mutated   Interval History: Hunter Terry presents today for avastin and irinotecan infusion.  He feels well today.  Denies new or progressive changes.  No balance issues, motor dysfunction.  Continues on Keppra 750mg  BID, no headaches.  H+P (07/24/20) Patient presented to medical attention in late October, 2021 with  new onset seizure.  Event was decribed as loss of consciousness, sudden, without clarity on further details aside from altered mental status upon awakening.  CNS imaging demonsrated non-enhancing mass within left temporal lobe c/w likely glioma; he underwent stereotactic biopsy with Dr. Maisie Fus on 06/15/20.  There was significant delay in finalizing path, explaining delay in follow up and evaluation.  He denies any seizures since discharge from hospital, on 4 anti-seizure drugs.  He does complain of dizziness and drowsiness with the medications, however.  Also describes impaired short term memory.  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    Medications: Current Outpatient Medications on File Prior to Visit  Medication Sig Dispense Refill   acetaminophen (TYLENOL) 500 MG tablet Take 500 mg by mouth every 6 (six) hours as needed for moderate pain. (Patient not taking: Reported on 12/02/2022)     levETIRAcetam (KEPPRA) 750 MG tablet Take 1 tablet (750 mg total) by mouth 2 (two) times daily. 60 tablet 3   loperamide (IMODIUM) 2 MG capsule Take 2 tabs by mouth with first loose stool, then 1 tab with each additional loose stool as needed. Do not exceed 8 tabs in a 24-hour period (Patient not taking: Reported on 12/02/2022) 30 capsule 1   Multiple Vitamin (MULTIVITAMIN WITH MINERALS) TABS tablet Take 1 tablet by mouth daily.     ondansetron (ZOFRAN) 8 MG tablet Take 1 tablet (8 mg total) by mouth every 8 (eight) hours as needed for nausea, vomiting or refractory nausea / vomiting. Start on the third day after chemotherapy. (Patient  not taking: Reported on 12/02/2022) 30 tablet 1   [DISCONTINUED] lacosamide (VIMPAT) 200 MG TABS tablet Take 0.5 tablets (100 mg total) by mouth 2 (two) times daily. 30 tablet 0   No current facility-administered medications on file prior to visit.    Allergies: No Known Allergies Past Medical History:  Past Medical History:  Diagnosis Date   Cancer (HCC)    BRAIN TUMOR    Headache    Seizure [facility])    Past Surgical History:  Past Surgical History:  Procedure Laterality Date   APPLICATION OF CRANIAL NAVIGATION N/A 06/15/2020   Procedure: APPLICATION OF CRANIAL NAVIGATION;  Surgeon: Bedelia Person, MD;  [facility];  Service: Neurosurgery;  Laterality: N/A;   APPLICATION OF CRANIAL NAVIGATION N/A 08/02/2020   Procedure: APPLICATION OF CRANIAL NAVIGATION;  Surgeon: Bedelia Person, MD;  Location: [facility] OR;  Service: Neurosurgery;  Laterality: N/A;   APPLICATION OF CRANIAL NAVIGATION Left 09/05/2022   Procedure: APPLICATION OF CRANIAL NAVIGATION;  Surgeon: Bedelia Person, MD;  Location: [facility] OR;  Service: Neurosurgery;  Laterality: Left;   CRANIOTOMY N/A 08/02/2020   Procedure: CRANIOTOMY LEFT TEMPORAL LOBECTOMY FOR TUMOR EXCISION;  Surgeon: Bedelia Person, MD;  Location: [facility] OR;  Service: Neurosurgery;  Laterality: N/A;   CRANIOTOMY Left 09/05/2022   Procedure: CRANIOTOMY FOR RESECTION OF TEMPORAL LOBE TUMOR;  Surgeon: Bedelia Person, MD;  Location: [facility] OR;  Service: Neurosurgery;  Laterality: Left;   FRAMELESS  BIOPSY WITH BRAINLAB Left 06/15/2020   Procedure: Left temporal lobe stereotactic brain biopsy with brainlab;  Surgeon: Bedelia Person, MD;  Location: [facility] OR;  Service: Neurosurgery;  Laterality: Left;   Social History:  Social History   Socioeconomic History   Marital status: Married    Spouse name: Not on file   Number of children: Not on file   Years of education: Not on file   Highest education level: Not on file  Occupational History   Not on file  Tobacco Use   Smoking status: Never   Smokeless tobacco: Never  Vaping Use   Vaping Use: Never used  Substance and Sexual Activity   Alcohol use: Yes    Comment: 1 beer a month   Drug use: Never   Sexual activity: Yes    Partners: Female  Other Topics Concern   Not on file  Social History Narrative   Not on file   Social Determinants of Health   Financial Resource  Strain: Not on file  Food Insecurity: Not on file  Transportation Needs: Not on file  Physical Activity: Not on file  Stress: Not on file  Social Connections: Not on file  Intimate Partner Violence: Not on file   Family History:  Family History  Problem Relation Age of Onset   Cancer Neg Hx     Review of Systems: Constitutional: Doesn't report fevers, chills or abnormal weight loss Eyes: Doesn't report blurriness of vision Ears, nose, mouth, throat, and face: Doesn't report sore throat Respiratory: Doesn't report cough, dyspnea or wheezes Cardiovascular: Doesn't report palpitation, chest discomfort  Gastrointestinal:  Doesn't report nausea, constipation, diarrhea GU: Doesn't report incontinence Skin: Doesn't report skin rashes Neurological: Per HPI Musculoskeletal: Doesn't report joint pain Behavioral/Psych: Doesn't report anxiety  Physical Exam: Vitals:   01/13/23 1212  BP: (!) 108/56  Pulse: 67  Resp: 16  Temp: 97.7 F (36.5 C)  SpO2: 97%   KPS: 80. General: Alert, cooperative, pleasant, in no acute distress Head: Normal EENT: No conjunctival injection  or scleral icterus.  Lungs: Resp effort normal Cardiac: Regular rate Abdomen: Non-distended abdomen Skin: No rashes cyanosis or petechiae. Extremities: No clubbing or edema  Neurologic Exam: Mental Status: Awake, alert, attentive to examiner. Oriented to self and environment. Language has mildly impaired fluency with intact comprehension.  Cranial Nerves: Visual acuity is grossly normal. Visual fields are full. Extra-ocular movements intact. No ptosis. Face is symmetric Motor: Tone and bulk are normal. Power is full in both arms and legs. Reflexes are symmetric, no pathologic reflexes present.  Sensory: Intact to light touch Gait: Normal.   Labs: I have reviewed the data as listed    Component Value Date/Time   NA 138 12/30/2022 1014   K 4.3 12/30/2022 1014   CL 104 12/30/2022 1014   CO2 29 12/30/2022  1014   GLUCOSE 85 12/30/2022 1014   BUN 10 12/30/2022 1014   CREATININE 0.75 12/30/2022 1014   CALCIUM 9.2 12/30/2022 1014   PROT 7.2 12/30/2022 1014   ALBUMIN 4.1 12/30/2022 1014   AST 24 12/30/2022 1014   ALT 28 12/30/2022 1014   ALKPHOS 179 (H) 12/30/2022 1014   BILITOT 0.6 12/30/2022 1014   GFRNONAA >60 12/30/2022 1014   Lab Results  Component Value Date   WBC 2.6 (L) 01/13/2023   NEUTROABS 1.4 (L) 01/13/2023   HGB 13.1 01/13/2023   HCT 38.5 (L) 01/13/2023   MCV 91.9 01/13/2023   PLT 190 01/13/2023    Assessment/Plan 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  No new or progressive changes today.  Ok to treat despite drop in neutrophil count, still above 1.0.  Will recommend proceeding with cycle #3, day 1 of Irinotecan 125mg /m2 IV q2 weeks, with concurrent Avastin 10mg /kg IV q2 weeks.  We reviewed side effects, including diarrhea.  Avastin has been previously well tolerated.  Imodium will be provided for use at home if needed.  Chemotherapy should be held for the following:  ANC less than 1,000  Platelets less than 100,000  LFT or creatinine greater than 2x ULN  If clinical concerns/contraindications develop  Avastin should be held for the following:  ANC less than 500  Platelets less than 50,000  LFT or creatinine greater than 2x ULN  If clinical concerns/contraindications develop  Keppra will stay at 750mg  BID for now.  We appreciate the opportunity to participate in the care of Hunter Terry.     We ask that Hunter Terry return in 2 weeks with labs for evaluation prior to cycle 3, day 15 CPT-11 and avastin, or sooner as needed.  MRI brain will be scheduled for early July.  All questions were answered. The patient knows to call the clinic with any problems, questions or concerns. No barriers to learning were detected.  The total time spent in the encounter was 30 minutes and more than 50% was on counseling  and review of test results   Henreitta Leber, MD Medical Director of Neuro-Oncology [facility] 01/13/23 12:20 PM

## 2023-01-13 NOTE — Patient Instructions (Signed)
Instrucciones al darle de alta: Discharge Instructions Gracias por elegir al [facility] de Cncer de [place] para brindarle atencin mdica de oncologa y Teacher, English as a foreign language.   Si usted tiene una cita de laboratorio con [employer] de [place], por favor vaya directamente al [employer] de Cncer y regstrese en el rea de Engineer, maintenance (IT).   Use ropa cmoda y [place] para tener fcil acceso a las vas del Portacath (acceso venoso de Set designer duracin) o la lnea PICC (catter central colocado por va perifrica).   Nos esforzamos por ofrecerle tiempo de calidad con su proveedor. Es posible que tenga que volver a programar su cita si llega tarde (15 minutos o ms).  El llegar tarde le afecta a usted y a otros pacientes cuyas citas son posteriores a Armed forces operational officer.  Adems, si usted falta a tres o ms citas sin avisar a la oficina, puede ser retirado(a) de la clnica a discrecin del proveedor.      Para las solicitudes de renovacin de recetas, pida a su farmacia que se ponga en contacto con nuestra oficina y deje que transcurran 72 horas para que se complete el proceso de las renovaciones.    Hoy usted recibi los siguientes agentes de quimioterapia e/o inmunoterapia: Bevacizumab and Irinotecan      Para ayudar a prevenir las nuseas y los vmitos despus de su tratamiento, le recomendamos que tome su medicamento para las nuseas segn las indicaciones.  LOS SNTOMAS QUE DEBEN COMUNICARSE INMEDIATAMENTE SE INDICAN A CONTINUACIN: *FIEBRE SUPERIOR A 100.4 F (38 C) O MS *ESCALOFROS O SUDORACIN *NUSEAS Y VMITOS QUE NO SE CONTROLAN CON EL MEDICAMENTO PARA LAS NUSEAS *DIFICULTAD INUSUAL PARA RESPIRAR  *MORETONES O HEMORRAGIAS NO HABITUALES *PROBLEMAS URINARIOS (dolor o ardor al Geographical information systems officer o frecuencia para Geographical information systems officer) *PROBLEMAS INTESTINALES (diarrea inusual, estreimiento, dolor cerca del ano) SENSIBILIDAD EN LA BOCA Y EN LA GARGANTA CON O SIN LA PRESENCIA DE LCERAS (dolor de garganta, llagas en la boca o dolor de  muelas/dientes) ERUPCIN, HINCHAZN O DOLORES INUSUALES FLUJO VAGINAL INUSUAL O PICAZN/RASQUIA    Los puntos marcados con un asterisco ( *) indican una posible emergencia y debe hacer un seguimiento tan pronto como le sea posible o vaya al Departamento de Emergencias si se le presenta algn problema.  Por favor, muestre la [place] DE ADVERTENCIA DE Marc Morgans DE ADVERTENCIA DE Gardiner Fanti al registrarse en [address] de Emergencias y a la enfermera de triaje.  Si tiene preguntas despus de su visita o necesita cancelar o volver a programar su cita, por favor pngase en contacto con [place] [facility]  Dept: [phone] y siga las instrucciones. Las horas de oficina son de 8:00 a.m. a 4:30 p.m. de lunes a viernes. Por favor, tenga en cuenta que los mensajes de voz que se dejan despus de las 4:00 p.m. posiblemente no se devolvern hasta el siguiente da de [place].  Cerramos los fines de semana y [employer]. En todo momento tiene acceso a una enfermera para preguntas urgentes. Por favor, llame al nmero principal de la clnica Dept: [phone] y siga las instrucciones.   Para cualquier pregunta que no sea de carcter urgente, tambin puede ponerse en contacto con su proveedor [employer]. Ahora ofrecemos visitas electrnicas para cualquier persona mayor de 18 aos que solicite atencin mdica en lnea para los sntomas que no sean urgentes. Para ms detalles vaya a [email or web address].   Tambin puede bajar la aplicacin de MyChart! Vaya a la tienda de aplicaciones, busque "MyChart",  abra la aplicacin, seleccione [place], e ingrese con su nombre de usuario y la contrasea de Clinical cytogeneticist.

## 2023-01-15 ENCOUNTER — Other Ambulatory Visit: Payer: Self-pay

## 2023-01-16 ENCOUNTER — Telehealth: Payer: Self-pay | Admitting: Internal Medicine

## 2023-01-16 NOTE — Telephone Encounter (Signed)
Scheduled per 06/03 los, patient has been called and notified. 

## 2023-01-24 MED FILL — Dexamethasone Sodium Phosphate Inj 100 MG/10ML: INTRAMUSCULAR | Qty: 1 | Status: AC

## 2023-01-27 ENCOUNTER — Inpatient Hospital Stay: Payer: Medicare Other

## 2023-01-27 ENCOUNTER — Other Ambulatory Visit: Payer: Self-pay

## 2023-01-27 ENCOUNTER — Inpatient Hospital Stay (HOSPITAL_BASED_OUTPATIENT_CLINIC_OR_DEPARTMENT_OTHER): Payer: Medicare Other | Admitting: Internal Medicine

## 2023-01-27 VITALS — BP 115/71 | HR 63 | Temp 98.1°F | Resp 17 | Ht 66.0 in | Wt 154.1 lb

## 2023-01-27 DIAGNOSIS — C712 Malignant neoplasm of temporal lobe: Secondary | ICD-10-CM | POA: Diagnosis not present

## 2023-01-27 DIAGNOSIS — C719 Malignant neoplasm of brain, unspecified: Secondary | ICD-10-CM | POA: Diagnosis not present

## 2023-01-27 LAB — CBC WITH DIFFERENTIAL (CANCER CENTER ONLY)
Abs Immature Granulocytes: 0.01 10*3/uL (ref 0.00–0.07)
Basophils Absolute: 0 10*3/uL (ref 0.0–0.1)
Basophils Relative: 1 %
Eosinophils Absolute: 0.1 10*3/uL (ref 0.0–0.5)
Eosinophils Relative: 3 %
HCT: 37.5 % — ABNORMAL LOW (ref 39.0–52.0)
Hemoglobin: 12.8 g/dL — ABNORMAL LOW (ref 13.0–17.0)
Immature Granulocytes: 1 %
Lymphocytes Relative: 34 %
Lymphs Abs: 0.7 10*3/uL (ref 0.7–4.0)
MCH: 31.8 pg (ref 26.0–34.0)
MCHC: 34.1 g/dL (ref 30.0–36.0)
MCV: 93.1 fL (ref 80.0–100.0)
Monocytes Absolute: 0.3 10*3/uL (ref 0.1–1.0)
Monocytes Relative: 16 %
Neutro Abs: 1 10*3/uL — ABNORMAL LOW (ref 1.7–7.7)
Neutrophils Relative %: 45 %
Platelet Count: 209 10*3/uL (ref 150–400)
RBC: 4.03 MIL/uL — ABNORMAL LOW (ref 4.22–5.81)
RDW: 14 % (ref 11.5–15.5)
WBC Count: 2.1 10*3/uL — ABNORMAL LOW (ref 4.0–10.5)
nRBC: 0 % (ref 0.0–0.2)

## 2023-01-27 LAB — CMP (CANCER CENTER ONLY)
ALT: 44 U/L (ref 0–44)
AST: 26 U/L (ref 15–41)
Albumin: 3.9 g/dL (ref 3.5–5.0)
Alkaline Phosphatase: 194 U/L — ABNORMAL HIGH (ref 38–126)
Anion gap: 6 (ref 5–15)
BUN: 10 mg/dL (ref 8–23)
CO2: 29 mmol/L (ref 22–32)
Calcium: 9.3 mg/dL (ref 8.9–10.3)
Chloride: 105 mmol/L (ref 98–111)
Creatinine: 0.7 mg/dL (ref 0.61–1.24)
GFR, Estimated: >60 mL/min (ref >60)
Glucose, Bld: 106 mg/dL — ABNORMAL HIGH (ref 70–99)
Potassium: 3.9 mmol/L (ref 3.5–5.1)
Sodium: 140 mmol/L (ref 135–145)
Total Bilirubin: 0.6 mg/dL (ref 0.3–1.2)
Total Protein: 6.7 g/dL (ref 6.5–8.1)

## 2023-01-27 LAB — TOTAL PROTEIN, URINE DIPSTICK: Protein, ur: NEGATIVE mg/dL

## 2023-01-27 MED ORDER — LEVETIRACETAM 750 MG PO TABS: 750.0000 mg | ORAL_TABLET | Freq: Two times a day (BID) | ORAL | 3 refills | Status: DC

## 2023-01-27 NOTE — Progress Notes (Signed)
[facility] at [facility] [address]  [place], [place] [address] [phone]   Interval Evaluation  Date of Service: 01/27/23 Patient Name: Schneider Gavins Patient MRN: [id number] Patient DOB: [birth year] Provider: Henreitta Leber, MD  Identifying Statement:  Melrose Sandgren is a 62 y.o. male with left temporal glioblastoma   Oncologic History: Oncology History  Glioblastoma, IDH-wildtype (HCC)  06/15/2020 Surgery   Stereotactic biopsy, L temporal Maisie Fus).  Path demonstrates glioblastoma IDH-1 wt   08/02/2020 Surgery   Debulking craniotomy with Dr. Maisie Fus   09/05/2020 - 10/17/2020 Radiation Therapy   IMRT with concurrent Temozolomide [name])   11/19/2020 - 06/18/2021 Chemotherapy   Completes #7 cycles adjuvant 5-day Temozolomide       07/17/2021 Progression   Progression of disease #1   07/18/2021 - 07/10/2022 Chemotherapy   Completes 8 cycles of CCNU 90mg /m2 PO q6 weeks and Avastin 10mg /kg IV q2 weeks   07/11/2022 Progression   Progression of disease #2   09/05/2022 Surgery   Repeat left temporal craniotomy with Dr. Maisie Fus; path is Glioblastoma IDHwt   09/19/2022 -  Chemotherapy   Initiates metronomic Temozolomide, 50mg /m2 PO daily   09/23/2022 - 09/23/2022 Chemotherapy   Patient is on Treatment Plan : BRAIN Low Grade Glioma Grade II, Glioblastoma,  Astrocytoma, Oligodendroma, Recurrent or Progressive / Temozolomide D1-5 Q28 Days     11/04/2022 -  Chemotherapy   Patient is on Treatment Plan : BRAIN GBM [facility] Irinotecan D1,15 + Bevacizumab D1,15 q 28d       Biomarkers:  MGMT Unknown.  IDH 1/2 Wild type.  EGFR Unknown  TERT "Mutated   Interval History: Tayyab Rapp presents today for avastin and irinotecan infusion.  He feels well today.  Mild cough/cold symptoms, improving.  No balance issues, motor dysfunction.  Continues on Keppra 750mg  BID, no headaches.  H+P (07/24/20) Patient presented to medical attention in late October, 2021  with new onset seizure.  Event was decribed as loss of consciousness, sudden, without clarity on further details aside from altered mental status upon awakening.  CNS imaging demonsrated non-enhancing mass within left temporal lobe c/w likely glioma; he underwent stereotactic biopsy with Dr. Maisie Fus on 06/15/20.  There was significant delay in finalizing path, explaining delay in follow up and evaluation.  He denies any seizures since discharge from hospital, on 4 anti-seizure drugs.  He does complain of dizziness and drowsiness with the medications, however.  Also describes impaired short term memory.  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    Medications: Current Outpatient Medications on File Prior to Visit  Medication Sig Dispense Refill   acetaminophen (TYLENOL) 500 MG tablet Take 500 mg by mouth every 6 (six) hours as needed for moderate pain. (Patient not taking: Reported on 12/02/2022)     levETIRAcetam (KEPPRA) 750 MG tablet Take 1 tablet (750 mg total) by mouth 2 (two) times daily. 60 tablet 3   loperamide (IMODIUM) 2 MG capsule Take 2 tabs by mouth with first loose stool, then 1 tab with each additional loose stool as needed. Do not exceed 8 tabs in a 24-hour period (Patient not taking: Reported on 12/02/2022) 30 capsule 1   Multiple Vitamin (MULTIVITAMIN WITH MINERALS) TABS tablet Take 1 tablet by mouth daily.     ondansetron (ZOFRAN) 8 MG tablet Take 1 tablet (8 mg total) by mouth every 8 (eight) hours as needed for nausea, vomiting or refractory nausea / vomiting. Start on the third day after chemotherapy. (Patient not  taking: Reported on 12/02/2022) 30 tablet 1   [DISCONTINUED] lacosamide (VIMPAT) 200 MG TABS tablet Take 0.5 tablets (100 mg total) by mouth 2 (two) times daily. 30 tablet 0   No current facility-administered medications on file prior to visit.    Allergies: No Known Allergies Past Medical History:  Past Medical History:  Diagnosis Date   Cancer (HCC)    BRAIN  TUMOR   Headache    Seizure [facility])    Past Surgical History:  Past Surgical History:  Procedure Laterality Date   APPLICATION OF CRANIAL NAVIGATION N/A 06/15/2020   Procedure: APPLICATION OF CRANIAL NAVIGATION;  Surgeon: Bedelia Person, MD;  Location: [facility] OR;  Service: Neurosurgery;  Laterality: N/A;   APPLICATION OF CRANIAL NAVIGATION N/A 08/02/2020   Procedure: APPLICATION OF CRANIAL NAVIGATION;  Surgeon: Bedelia Person, MD;  Location: [facility] OR;  Service: Neurosurgery;  Laterality: N/A;   APPLICATION OF CRANIAL NAVIGATION Left 09/05/2022   Procedure: APPLICATION OF CRANIAL NAVIGATION;  Surgeon: Bedelia Person, MD;  Location: [facility] OR;  Service: Neurosurgery;  Laterality: Left;   CRANIOTOMY N/A 08/02/2020   Procedure: CRANIOTOMY LEFT TEMPORAL LOBECTOMY FOR TUMOR EXCISION;  Surgeon: Bedelia Person, MD;  Location: [facility] OR;  Service: Neurosurgery;  Laterality: N/A;   CRANIOTOMY Left 09/05/2022   Procedure: CRANIOTOMY FOR RESECTION OF TEMPORAL LOBE TUMOR;  Surgeon: Bedelia Person, MD;  Location: [facility] OR;  Service: Neurosurgery;  Laterality: Left;   FRAMELESS  BIOPSY WITH BRAINLAB Left 06/15/2020   Procedure: Left temporal lobe stereotactic brain biopsy with brainlab;  Surgeon: Bedelia Person, MD;  Location: [facility] OR;  Service: Neurosurgery;  Laterality: Left;   Social History:  Social History   Socioeconomic History   Marital status: Married    Spouse name: Not on file   Number of children: Not on file   Years of education: Not on file   Highest education level: Not on file  Occupational History   Not on file  Tobacco Use   Smoking status: Never   Smokeless tobacco: Never  Vaping Use   Vaping Use: Never used  Substance and Sexual Activity   Alcohol use: Yes    Comment: 1 beer a month   Drug use: Never   Sexual activity: Yes    Partners: Female  Other Topics Concern   Not on file  Social History Narrative   Not on file   Social Determinants of Health   Financial  Resource Strain: Not on file  Food Insecurity: Not on file  Transportation Needs: Not on file  Physical Activity: Not on file  Stress: Not on file  Social Connections: Not on file  Intimate Partner Violence: Not on file   Family History:  Family History  Problem Relation Age of Onset   Cancer Neg Hx     Review of Systems: Constitutional: Doesn't report fevers, chills or abnormal weight loss Eyes: Doesn't report blurriness of vision Ears, nose, mouth, throat, and face: Doesn't report sore throat Respiratory: Doesn't report cough, dyspnea or wheezes Cardiovascular: Doesn't report palpitation, chest discomfort  Gastrointestinal:  Doesn't report nausea, constipation, diarrhea GU: Doesn't report incontinence Skin: Doesn't report skin rashes Neurological: Per HPI Musculoskeletal: Doesn't report joint pain Behavioral/Psych: Doesn't report anxiety  Physical Exam: Vitals:   01/27/23 0848  BP: 115/71  Pulse: 63  Resp: 17  Temp: 98.1 F (36.7 C)  SpO2: 99%    KPS: 80. General: Alert, cooperative, pleasant, in no acute distress Head: Normal EENT: No conjunctival injection or  scleral icterus.  Lungs: Resp effort normal Cardiac: Regular rate Abdomen: Non-distended abdomen Skin: No rashes cyanosis or petechiae. Extremities: No clubbing or edema  Neurologic Exam: Mental Status: Awake, alert, attentive to examiner. Oriented to self and environment. Language has mildly impaired fluency with intact comprehension.  Cranial Nerves: Visual acuity is grossly normal. Visual fields are full. Extra-ocular movements intact. No ptosis. Face is symmetric Motor: Tone and bulk are normal. Power is full in both arms and legs. Reflexes are symmetric, no pathologic reflexes present.  Sensory: Intact to light touch Gait: Normal.   Labs: I have reviewed the data as listed    Component Value Date/Time   NA 140 01/13/2023 1141   K 3.9 01/13/2023 1141   CL 104 01/13/2023 1141   CO2 30  01/13/2023 1141   GLUCOSE 93 01/13/2023 1141   BUN 11 01/13/2023 1141   CREATININE 0.72 01/13/2023 1141   CALCIUM 9.3 01/13/2023 1141   PROT 7.1 01/13/2023 1141   ALBUMIN 4.1 01/13/2023 1141   AST 37 01/13/2023 1141   ALT 51 (H) 01/13/2023 1141   ALKPHOS 200 (H) 01/13/2023 1141   BILITOT 0.6 01/13/2023 1141   GFRNONAA >60 01/13/2023 1141   Lab Results  Component Value Date   WBC 2.1 (L) 01/27/2023   NEUTROABS 1.0 (L) 01/27/2023   HGB 12.8 (L) 01/27/2023   HCT 37.5 (L) 01/27/2023   MCV 93.1 01/27/2023   PLT 209 01/27/2023    Assessment/Plan Glioblastoma, IDH-wildtype (HCC) - Plan: Clinic Appointment Request  Jamez Levee presents today for CPT-11 and Avastin.  No new or progressive changes today.  Ok to treat despite drop in neutrophil count, still above 1.0.  Will recommend proceeding with cycle #4, day 1 of Irinotecan 125mg /m2 IV q2 weeks, with concurrent Avastin 10mg /kg IV q2 weeks.  We reviewed side effects, including diarrhea.  Avastin has been previously well tolerated.  Imodium will be provided for use at home if needed.  Treatment will be held today due to neutropenia.  We will have him return in 1 week and recheck labs.  Pending MRI findings, will then move to q3 week schedule.  Chemotherapy should be held for the following:  ANC less than 1,000  Platelets less than 100,000  LFT or creatinine greater than 2x ULN  If clinical concerns/contraindications develop  Avastin should be held for the following:  ANC less than 500  Platelets less than 50,000  LFT or creatinine greater than 2x ULN  If clinical concerns/contraindications develop  Keppra will stay at 750mg  BID for now.  We appreciate the opportunity to participate in the care of [place].     We ask that [employer] return in 1 weeks with labs for evaluation prior to cycle 4, day 1 CPT-11 and avastin, or sooner as needed.  MRI currently scheduled for 02/20/23.  All questions were  answered. The patient knows to call the clinic with any problems, questions or concerns. No barriers to learning were detected.  The total time spent in the encounter was 30 minutes and more than 50% was on counseling and review of test results   Henreitta Leber, MD Medical Director of Neuro-Oncology [facility] at [facility] 01/27/23 8:44 AM

## 2023-01-28 ENCOUNTER — Other Ambulatory Visit: Payer: Self-pay

## 2023-01-31 MED FILL — Dexamethasone Sodium Phosphate Inj 100 MG/10ML: INTRAMUSCULAR | Qty: 1 | Status: AC

## 2023-02-03 ENCOUNTER — Inpatient Hospital Stay: Payer: Medicare Other

## 2023-02-03 ENCOUNTER — Inpatient Hospital Stay (HOSPITAL_BASED_OUTPATIENT_CLINIC_OR_DEPARTMENT_OTHER): Payer: Medicare Other | Admitting: Internal Medicine

## 2023-02-03 VITALS — BP 115/71 | HR 69 | Temp 98.2°F | Resp 16 | Ht 66.0 in | Wt 156.5 lb

## 2023-02-03 VITALS — BP 117/74 | HR 57 | Resp 16

## 2023-02-03 DIAGNOSIS — C719 Malignant neoplasm of brain, unspecified: Secondary | ICD-10-CM

## 2023-02-03 DIAGNOSIS — R569 Unspecified convulsions: Secondary | ICD-10-CM

## 2023-02-03 DIAGNOSIS — C712 Malignant neoplasm of temporal lobe: Secondary | ICD-10-CM | POA: Diagnosis not present

## 2023-02-03 LAB — CMP (CANCER CENTER ONLY)
ALT: 49 U/L — ABNORMAL HIGH (ref 0–44)
AST: 39 U/L (ref 15–41)
Albumin: 4 g/dL (ref 3.5–5.0)
Alkaline Phosphatase: 165 U/L — ABNORMAL HIGH (ref 38–126)
Anion gap: 6 (ref 5–15)
BUN: 10 mg/dL (ref 8–23)
CO2: 28 mmol/L (ref 22–32)
Calcium: 9.6 mg/dL (ref 8.9–10.3)
Chloride: 105 mmol/L (ref 98–111)
Creatinine: 0.77 mg/dL (ref 0.61–1.24)
GFR, Estimated: >60 mL/min (ref >60)
Glucose, Bld: 112 mg/dL — ABNORMAL HIGH (ref 70–99)
Potassium: 4.2 mmol/L (ref 3.5–5.1)
Sodium: 139 mmol/L (ref 135–145)
Total Bilirubin: 0.5 mg/dL (ref 0.3–1.2)
Total Protein: 6.7 g/dL (ref 6.5–8.1)

## 2023-02-03 LAB — CBC WITH DIFFERENTIAL (CANCER CENTER ONLY)
Abs Immature Granulocytes: 0.01 10*3/uL (ref 0.00–0.07)
Basophils Absolute: 0 10*3/uL (ref 0.0–0.1)
Basophils Relative: 1 %
Eosinophils Absolute: 0.1 10*3/uL (ref 0.0–0.5)
Eosinophils Relative: 3 %
HCT: 38.1 % — ABNORMAL LOW (ref 39.0–52.0)
Hemoglobin: 13.3 g/dL (ref 13.0–17.0)
Immature Granulocytes: 0 %
Lymphocytes Relative: 32 %
Lymphs Abs: 0.8 10*3/uL (ref 0.7–4.0)
MCH: 31.9 pg (ref 26.0–34.0)
MCHC: 34.9 g/dL (ref 30.0–36.0)
MCV: 91.4 fL (ref 80.0–100.0)
Monocytes Absolute: 0.5 10*3/uL (ref 0.1–1.0)
Monocytes Relative: 20 %
Neutro Abs: 1 10*3/uL — ABNORMAL LOW (ref 1.7–7.7)
Neutrophils Relative %: 44 %
Platelet Count: 191 10*3/uL (ref 150–400)
RBC: 4.17 MIL/uL — ABNORMAL LOW (ref 4.22–5.81)
RDW: 14.2 % (ref 11.5–15.5)
WBC Count: 2.3 10*3/uL — ABNORMAL LOW (ref 4.0–10.5)
nRBC: 0 % (ref 0.0–0.2)

## 2023-02-03 MED ORDER — PALONOSETRON HCL INJECTION 0.25 MG/5ML
0.2500 mg | Freq: Once | INTRAVENOUS | Status: AC
  Administered 2023-02-03: 0.25 mg via INTRAVENOUS
  Filled 2023-02-03: qty 5

## 2023-02-03 MED ORDER — SODIUM CHLORIDE 0.9 % IV SOLN
10.0000 mg | Freq: Once | INTRAVENOUS | Status: AC
  Administered 2023-02-03: 10 mg via INTRAVENOUS
  Filled 2023-02-03: qty 10
  Filled 2023-02-03: qty 1

## 2023-02-03 MED ORDER — SODIUM CHLORIDE 0.9 % IV SOLN: Freq: Once | INTRAVENOUS | Status: AC

## 2023-02-03 MED ORDER — ATROPINE SULFATE 1 MG/ML IV SOLN
0.5000 mg | Freq: Once | INTRAVENOUS | Status: AC | PRN
  Administered 2023-02-03: 0.5 mg via INTRAVENOUS
  Filled 2023-02-03: qty 1

## 2023-02-03 MED ORDER — IRINOTECAN HCL CHEMO INJECTION 100 MG/5ML
125.0000 mg/m2 | Freq: Once | INTRAVENOUS | Status: AC
  Administered 2023-02-03: 220 mg via INTRAVENOUS
  Filled 2023-02-03: qty 11

## 2023-02-03 MED ORDER — SODIUM CHLORIDE 0.9 % IV SOLN
10.0000 mg/kg | Freq: Once | INTRAVENOUS | Status: AC
  Administered 2023-02-03: 700 mg via INTRAVENOUS
  Filled 2023-02-03: qty 16

## 2023-02-03 NOTE — Progress Notes (Signed)
Patient approved for Irinotecan and Avastin today with labs per Dr Barbaraann Cao.  Patient will come back tomorrow for injection (filgrastim) to boost ANC.

## 2023-02-03 NOTE — Patient Instructions (Signed)
Instrucciones al darle de alta: Discharge Instructions Gracias por elegir al [facility] de Cncer de [place] para brindarle atencin mdica de oncologa y Teacher, English as a foreign language.   Si usted tiene una cita de laboratorio con [employer] de [place], por favor vaya directamente al [employer] de Cncer y regstrese en el rea de Engineer, maintenance (IT).   Use ropa cmoda y [place] para tener fcil acceso a las vas del Portacath (acceso venoso de Set designer duracin) o la lnea PICC (catter central colocado por va perifrica).   Nos esforzamos por ofrecerle tiempo de calidad con su proveedor. Es posible que tenga que volver a programar su cita si llega tarde (15 minutos o ms).  El llegar tarde le afecta a usted y a otros pacientes cuyas citas son posteriores a Armed forces operational officer.  Adems, si usted falta a tres o ms citas sin avisar a la oficina, puede ser retirado(a) de la clnica a discrecin del proveedor.      Para las solicitudes de renovacin de recetas, pida a su farmacia que se ponga en contacto con nuestra oficina y deje que transcurran 72 horas para que se complete el proceso de las renovaciones.    Hoy usted recibi los siguientes agentes de quimioterapia e/o inmunoterapia: Bevacizumab (VEGZELMA) & Irinotecan      Para ayudar a prevenir las nuseas y los vmitos despus de su tratamiento, le recomendamos que tome su medicamento para las nuseas segn las indicaciones.  LOS SNTOMAS QUE DEBEN COMUNICARSE INMEDIATAMENTE SE INDICAN A CONTINUACIN: *FIEBRE SUPERIOR A 100.4 F (38 C) O MS *ESCALOFROS O SUDORACIN *NUSEAS Y VMITOS QUE NO SE CONTROLAN CON EL MEDICAMENTO PARA LAS NUSEAS *DIFICULTAD INUSUAL PARA RESPIRAR  *MORETONES O HEMORRAGIAS NO HABITUALES *PROBLEMAS URINARIOS (dolor o ardor al Geographical information systems officer o frecuencia para Geographical information systems officer) *PROBLEMAS INTESTINALES (diarrea inusual, estreimiento, dolor cerca del ano) SENSIBILIDAD EN LA BOCA Y EN LA GARGANTA CON O SIN LA PRESENCIA DE LCERAS (dolor de garganta, llagas en la boca o dolor de  muelas/dientes) ERUPCIN, HINCHAZN O DOLORES INUSUALES FLUJO VAGINAL INUSUAL O PICAZN/RASQUIA    Los puntos marcados con un asterisco ( *) indican una posible emergencia y debe hacer un seguimiento tan pronto como le sea posible o vaya al Departamento de Emergencias si se le presenta algn problema.  Por favor, muestre la [place] DE ADVERTENCIA DE Marc Morgans DE ADVERTENCIA DE [place] Fanti al registrarse en [address] de Emergencias y a la enfermera de triaje.  Si tiene preguntas despus de su visita o necesita cancelar o volver a programar su cita, por favor pngase en contacto con [name]  Dept: [phone] y siga las instrucciones. Las horas de oficina son de 8:00 a.m. a 4:30 p.m. de lunes a viernes. Por favor, tenga en cuenta que los mensajes de voz que se dejan despus de las 4:00 p.m. posiblemente no se devolvern hasta el siguiente da de [place].  Cerramos los fines de semana y [employer]. En todo momento tiene acceso a una enfermera para preguntas urgentes. Por favor, llame al nmero principal de la clnica Dept: [phone] y siga las instrucciones.   Para cualquier pregunta que no sea de carcter urgente, tambin puede ponerse en contacto con su proveedor [employer]. Ahora ofrecemos visitas electrnicas para cualquier persona mayor de 18 aos que solicite atencin mdica en lnea para los sntomas que no sean urgentes. Para ms detalles vaya a [email or web address].   Tambin puede bajar la aplicacin de MyChart! Vaya a la tienda de aplicaciones, busque "  MyChart", abra la aplicacin, seleccione [place], e ingrese con su nombre de usuario y la contrasea de Clinical cytogeneticist.

## 2023-02-03 NOTE — Progress Notes (Signed)
[facility] [address]  [place], [place] [address] [phone]   Interval Evaluation  Date of Service: 02/03/23 Patient Name: Iokepa Geffre Patient MRN: [id number] Patient DOB: May 01, 1961 Provider: Henreitta Leber, MD  Identifying Statement:  Stanely Sexson is a 62 y.o. male with left temporal glioblastoma   Oncologic History: Oncology History  Glioblastoma, IDH-wildtype (HCC)  06/15/2020 Surgery   Stereotactic biopsy, L temporal Maisie Fus).  Path demonstrates glioblastoma IDH-1 wt   08/02/2020 Surgery   Debulking craniotomy with Dr. Maisie Fus   09/05/2020 - 10/17/2020 Radiation Therapy   IMRT with concurrent Temozolomide [name])   11/19/2020 - 06/18/2021 Chemotherapy   Completes #7 cycles adjuvant 5-day Temozolomide       07/17/2021 Progression   Progression of disease #1   07/18/2021 - 07/10/2022 Chemotherapy   Completes 8 cycles of CCNU 90mg /m2 PO q6 weeks and Avastin 10mg /kg IV q2 weeks   07/11/2022 Progression   Progression of disease #2   09/05/2022 Surgery   Repeat left temporal craniotomy with Dr. Maisie Fus; path is Glioblastoma IDHwt   09/19/2022 -  Chemotherapy   Initiates metronomic Temozolomide, 50mg /m2 PO daily   09/23/2022 - 09/23/2022 Chemotherapy   Patient is on Treatment Plan : BRAIN Low Grade Glioma Grade II, Glioblastoma,  Astrocytoma, Oligodendroma, Recurrent or Progressive / Temozolomide D1-5 Q28 Days     11/04/2022 -  Chemotherapy   Patient is on Treatment Plan : BRAIN GBM [facility] Irinotecan D1,15 + Bevacizumab D1,15 q 28d       Biomarkers:  MGMT Unknown.  IDH 1/2 Wild type.  EGFR Unknown  TERT "Mutated   Interval History: Takumi Din presents today for avastin and irinotecan infusion.  No formal complaints today.  No balance issues, motor dysfunction.  Continues on Keppra 750mg  BID, no headaches.  H+P (07/24/20) Patient presented to medical attention in late October, 2021 with new onset seizure.  Event was  decribed as loss of consciousness, sudden, without clarity on further details aside from altered mental status upon awakening.  CNS imaging demonsrated non-enhancing mass within left temporal lobe c/w likely glioma; he underwent stereotactic biopsy with Dr. Maisie Fus on 06/15/20.  There was significant delay in finalizing path, explaining delay in follow up and evaluation.  He denies any seizures since discharge from hospital, on 4 anti-seizure drugs.  He does complain of dizziness and drowsiness with the medications, however.  Also describes impaired short term memory.  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    Medications: Current Outpatient Medications on File Prior to Visit  Medication Sig Dispense Refill   acetaminophen (TYLENOL) 500 MG tablet Take 500 mg by mouth every 6 (six) hours as needed for moderate pain. (Patient not taking: Reported on 12/02/2022)     levETIRAcetam (KEPPRA) 750 MG tablet Take 1 tablet (750 mg total) by mouth 2 (two) times daily. 60 tablet 3   loperamide (IMODIUM) 2 MG capsule Take 2 tabs by mouth with first loose stool, then 1 tab with each additional loose stool as needed. Do not exceed 8 tabs in a 24-hour period (Patient not taking: Reported on 12/02/2022) 30 capsule 1   Multiple Vitamin (MULTIVITAMIN WITH MINERALS) TABS tablet Take 1 tablet by mouth daily.     ondansetron (ZOFRAN) 8 MG tablet Take 1 tablet (8 mg total) by mouth every 8 (eight) hours as needed for nausea, vomiting or refractory nausea / vomiting. Start on the third day after chemotherapy. (Patient not taking: Reported on 12/02/2022) 30  tablet 1   [DISCONTINUED] lacosamide (VIMPAT) 200 MG TABS tablet Take 0.5 tablets (100 mg total) by mouth 2 (two) times daily. 30 tablet 0   No current facility-administered medications on file prior to visit.    Allergies: No Known Allergies Past Medical History:  Past Medical History:  Diagnosis Date   Cancer (HCC)    BRAIN TUMOR   Headache    Seizure [facility])     Past Surgical History:  Past Surgical History:  Procedure Laterality Date   APPLICATION OF CRANIAL NAVIGATION N/A 06/15/2020   Procedure: APPLICATION OF CRANIAL NAVIGATION;  Surgeon: Bedelia Person, MD;  Location[facility] OR;  Service: Neurosurgery;  Laterality: N/A;   APPLICATION OF CRANIAL NAVIGATION N/A 08/02/2020   Procedure: APPLICATION OF CRANIAL NAVIGATION;  Surgeon: Bedelia Person, MD;  Location: [facility] OR;  Service: Neurosurgery;  Laterality: N/A;   APPLICATION OF CRANIAL NAVIGATION Left 09/05/2022   Procedure: APPLICATION OF CRANIAL NAVIGATION;  Surgeon: Bedelia Person, MD;  Location: [facility] OR;  Service: Neurosurgery;  Laterality: Left;   CRANIOTOMY N/A 08/02/2020   Procedure: CRANIOTOMY LEFT TEMPORAL LOBECTOMY FOR TUMOR EXCISION;  Surgeon: Bedelia Person, MD;  Location: [facility] OR;  Service: Neurosurgery;  Laterality: N/A;   CRANIOTOMY Left 09/05/2022   Procedure: CRANIOTOMY FOR RESECTION OF TEMPORAL LOBE TUMOR;  Surgeon: Bedelia Person, MD;  Location: [facility] OR;  Service: Neurosurgery;  Laterality: Left;   FRAMELESS  BIOPSY WITH BRAINLAB Left 06/15/2020   Procedure: Left temporal lobe stereotactic brain biopsy with brainlab;  Surgeon: Bedelia Person, MD;  Location: [facility] OR;  Service: Neurosurgery;  Laterality: Left;   Social History:  Social History   Socioeconomic History   Marital status: Married    Spouse name: Not on file   Number of children: Not on file   Years of education: Not on file   Highest education level: Not on file  Occupational History   Not on file  Tobacco Use   Smoking status: Never   Smokeless tobacco: Never  Vaping Use   Vaping Use: Never used  Substance and Sexual Activity   Alcohol use: Yes    Comment: 1 beer a month   Drug use: Never   Sexual activity: Yes    Partners: Female  Other Topics Concern   Not on file  Social History Narrative   Not on file   Social Determinants of Health   Financial Resource Strain: Not on file  Food  Insecurity: Not on file  Transportation Needs: Not on file  Physical Activity: Not on file  Stress: Not on file  Social Connections: Not on file  Intimate Partner Violence: Not on file   Family History:  Family History  Problem Relation Age of Onset   Cancer Neg Hx     Review of Systems: Constitutional: Doesn't report fevers, chills or abnormal weight loss Eyes: Doesn't report blurriness of vision Ears, nose, mouth, throat, and face: Doesn't report sore throat Respiratory: Doesn't report cough, dyspnea or wheezes Cardiovascular: Doesn't report palpitation, chest discomfort  Gastrointestinal:  Doesn't report nausea, constipation, diarrhea GU: Doesn't report incontinence Skin: Doesn't report skin rashes Neurological: Per HPI Musculoskeletal: Doesn't report joint pain Behavioral/Psych: Doesn't report anxiety  Physical Exam: Vitals:   02/03/23 1040  BP: 115/71  Pulse: 69  Resp: 16  Temp: 98.2 F (36.8 C)  SpO2: 99%   KPS: 80. General: Alert, cooperative, pleasant, in no acute distress Head: Normal EENT: No conjunctival injection or scleral icterus.  Lungs: Resp effort  normal Cardiac: Regular rate Abdomen: Non-distended abdomen Skin: No rashes cyanosis or petechiae. Extremities: No clubbing or edema  Neurologic Exam: Mental Status: Awake, alert, attentive to examiner. Oriented to self and environment. Language has mildly impaired fluency with intact comprehension.  Cranial Nerves: Visual acuity is grossly normal. Visual fields are full. Extra-ocular movements intact. No ptosis. Face is symmetric Motor: Tone and bulk are normal. Power is full in both arms and legs. Reflexes are symmetric, no pathologic reflexes present.  Sensory: Intact to light touch Gait: Normal.   Labs: I have reviewed the data as listed    Component Value Date/Time   NA 140 01/27/2023 0829   K 3.9 01/27/2023 0829   CL 105 01/27/2023 0829   CO2 29 01/27/2023 0829   GLUCOSE 106 (H) 01/27/2023  0829   BUN 10 01/27/2023 0829   CREATININE 0.70 01/27/2023 0829   CALCIUM 9.3 01/27/2023 0829   PROT 6.7 01/27/2023 0829   ALBUMIN 3.9 01/27/2023 0829   AST 26 01/27/2023 0829   ALT 44 01/27/2023 0829   ALKPHOS 194 (H) 01/27/2023 0829   BILITOT 0.6 01/27/2023 0829   GFRNONAA >60 01/27/2023 0829   Lab Results  Component Value Date   WBC 2.3 (L) 02/03/2023   NEUTROABS 1.0 (L) 02/03/2023   HGB 13.3 02/03/2023   HCT 38.1 (L) 02/03/2023   MCV 91.4 02/03/2023   PLT 191 02/03/2023    Assessment/Plan Focal seizures (HCC)  Glioblastoma, IDH-wildtype (HCC) - Plan: Clinic Appointment Request  Jarrel Knoke presents today for CPT-11 and Avastin.  No new or progressive changes today.    Will recommend proceeding with cycle #4, day 1 of Irinotecan 125mg /m2 IV q2 weeks, with concurrent Avastin 10mg /kg IV q2 weeks.  We reviewed side effects, including diarrhea.  Avastin has been previously well tolerated.  Imodium will be provided for use at home if needed.  For neutropenia, will administer zarxio tomorrow once 24 hr s/p infusion.   Pending MRI findings, will then move to q3 week schedule.  Chemotherapy should be held for the following:  ANC less than 1,000  Platelets less than 100,000  LFT or creatinine greater than 2x ULN  If clinical concerns/contraindications develop  Avastin should be held for the following:  ANC less than 500  Platelets less than 50,000  LFT or creatinine greater than 2x ULN  If clinical concerns/contraindications develop  Keppra will stay at 750mg  BID for now.  We appreciate the opportunity to participate in the care of Imraan Vera Cruz.     We ask that Yakov Bergen return in 3 weeks with MRI brain for evaluation prior to cycle 4, day 15 CPT-11 and avastin, or sooner as needed.  MRI currently scheduled for 02/20/23.  All questions were answered. The patient knows to call the clinic with any problems, questions or concerns. No barriers to  learning were detected.  The total time spent in the encounter was 30 minutes and more than 50% was on counseling and review of test results   Henreitta Leber, MD Medical Director of Neuro-Oncology [facility] at [facility] 02/03/23 10:34 AM

## 2023-02-04 ENCOUNTER — Inpatient Hospital Stay: Payer: Medicare Other

## 2023-02-04 ENCOUNTER — Other Ambulatory Visit: Payer: Self-pay

## 2023-02-04 VITALS — BP 115/65 | HR 54 | Temp 98.4°F | Resp 16

## 2023-02-04 DIAGNOSIS — C719 Malignant neoplasm of brain, unspecified: Secondary | ICD-10-CM

## 2023-02-04 DIAGNOSIS — C712 Malignant neoplasm of temporal lobe: Secondary | ICD-10-CM | POA: Diagnosis not present

## 2023-02-04 MED ORDER — FILGRASTIM-SNDZ 480 MCG/0.8ML IJ SOSY
480.0000 ug | PREFILLED_SYRINGE | Freq: Once | INTRAMUSCULAR | Status: AC
  Administered 2023-02-04: 480 ug via SUBCUTANEOUS
  Filled 2023-02-04: qty 0.8

## 2023-02-04 NOTE — Patient Instructions (Signed)
Filgrastim Injection Qu es este medicamento? El FILGRASTIM disminuye el riesgo de infeccin en personas que estn recibiendo quimioterapia. Acta ayudando al cuerpo a producir ms glbulos blancos, que protegen al cuerpo contra las infecciones. Tambin podra usarse para ayudar a las personas que han estado expuestas a dosis elevadas de radiacin. Se puede utilizar para ayudar a preparar su cuerpo antes de un trasplante de clulas madre. Funciona ayudando a que la mdula sea produzca y Medical illustrator en la [place]. Este medicamento puede ser utilizado para otros usos; si tiene alguna pregunta consulte con su proveedor de atencin mdica o con su farmacutico. MARCAS COMUNES: Neupogen, Nivestym, Releuko, Zarxio [employer] debo informar a mi profesional de la salud antes de tomar este medicamento? Necesitan saber si usted presenta alguno de los [employer] o situaciones: Antecedentes de enfermedades de la sangre, como anemia falciforme Enfermedad renal Radiacin en curso o reciente Una reaccin alrgica o inusual al filgrastim, al pegfilgrastim, al ltex, al caucho, a otros medicamentos, alimentos, colorantes o conservantes Si est embarazada o buscando quedar embarazada Si est amamantando a un beb Cmo debo Chemical engineer este medicamento? Este medicamento se inyecta debajo la piel o en una vena. Por lo general lo administra su equipo de atencin en un hospital o en un entorno clnico. Podra administrarse en su casa. Si recibe [employer] en su casa, le ensearn cmo prepararlo y administrarlo. Use el medicamento exactamente como se le indique. Use el medicamento segn las instrucciones en la etiqueta a la misma hora todos [place]. Siga usndolo a menos que su equipo de atencin le indique dejar de Media planner. Es importante que deseche las agujas y las jeringas usadas en un recipiente resistente a los pinchazos. No las deseche en la basura. Si no tiene un recipiente resistente a los  pinchazos, llame a su farmacutico o a su equipo de atencin para obtenerlo. Este medicamento viene con INSTRUCCIONES DE USO. Pdale a su farmacutico que le indique cmo usar [employer]. Lea la informacin atentamente. Hable con su farmacutico o su equipo de atencin si tiene [place] pregunta. Hable con su equipo de atencin sobre el uso de este medicamento en nios. Aunque se puede recetar a nios con ciertas afecciones, existen precauciones que deben tomarse. Sobredosis: Pngase en contacto inmediatamente con un centro toxicolgico o una sala de urgencia si usted cree que haya tomado demasiado medicamento. ATENCIN: [employer] es solo para usted. No comparta este medicamento con nadie. Qu sucede si me olvido de una dosis? Es importante no olvidar ninguna dosis. Hable con su equipo de atencin sobre qu hacer si [employer] dosis. Qu puede interactuar con este medicamento? Medicamentos que podran causar una liberacin de neutrfilos, como litio Puede ser que esta lista no menciona todas las posibles interacciones. Informe a su profesional de [employer] de [employer] productos a base de hierbas, medicamentos de [place] o suplementos nutritivos que est tomando. Si usted fuma, consume bebidas alcohlicas o si utiliza drogas ilegales, indqueselo tambin a su profesional de [employer]. Algunas sustancias pueden interactuar con su medicamento. A qu debo estar atento al usar [employer]? Se supervisar su estado de salud atentamente mientras reciba este medicamento. Usted podra necesitar realizarse [employer] de sangre mientras est usando [place]. Hable con su equipo de atencin sobre su riesgo de cncer. Usted podra tener mayor riesgo de desarrollar ciertos tipos de cncer si [place] este medicamento. Qu efectos secundarios puedo tener al [employer] este medicamento? Efectos secundarios que debe informar a su equipo  de atencin tan pronto como sea posible: Reacciones  alrgicas: erupcin cutnea, comezn/picazn, urticaria, hinchazn de la cara, los labios, la lengua o la garganta [place] de fuga capilar: Theme park manager o muscular, debilidad o fatiga inusuales, sensacin de desmayo o aturdimiento, disminucin de la cantidad de [place], hinchazn de los tobillos, las manos o los pies, dificultad respiratoria Nivel elevado de glbulos blancos: fiebre, fatiga, dificultad respiratoria, sudoracin nocturna, cambio en la visin, prdida de peso Inflamacin de la aorta: fiebre, fatiga, dolor de espalda, pecho o estmago, dolor de cabeza intenso Lesin renal (glomerulonefritis): disminucin de la cantidad de [place], [place] roja o [place], orina con espuma o burbujas, hinchazn de los tobillos, las manos o los pies Falta de aire o problemas para respirar Lesin en el bazo: Engineer, mining en la parte superior izquierda del abdomen u hombro Sangrado o moretones inusuales Efectos secundarios que generalmente no requieren atencin mdica (debe informarlos a su equipo de atencin si persisten o si son molestos): Dolor de Retail buyer de huesos Fatiga Teacher, English as a foreign language Dolor de cabeza Nuseas Puede ser que esta lista no menciona todos los posibles efectos secundarios. Comunquese a su mdico por asesoramiento mdico [employer]. Usted puede informar los efectos secundarios a la FDA por telfono al 1-800-FDA-1088. Dnde debo guardar mi medicina? Mantenga fuera del alcance de nios y Neurosurgeon. Mantenga este medicamento en el envase original hasta que est listo para usarlo. Proteja de Statistician. Vea la informacin sobre Engineering geologist. Cada producto podra tener distintas instrucciones. Deseche todo el medicamento que no haya utilizado despus de la fecha de vencimiento. Para desechar los medicamentos que ya no necesite o que estn vencidos: [employer] medicamentos a un programa de recuperacin de medicamentos. Consulte con su farmacia o con una entidad  reguladora para encontrar un lugar donde llevarlo. Si no puede Government social research officer, pregntele a Film/video editor o a su equipo de atencin cmo desecharlo de IT consultant. ATENCIN: Este folleto es un resumen. Puede ser que no cubra toda la posible informacin. Si usted tiene preguntas acerca de esta medicina, consulte con su mdico, su farmacutico o su profesional de Radiographer, therapeutic.  2024 Elsevier/Gold Standard (2022-06-19 00:00:00)

## 2023-02-05 ENCOUNTER — Other Ambulatory Visit: Payer: Self-pay

## 2023-02-05 ENCOUNTER — Telehealth: Payer: Self-pay | Admitting: Internal Medicine

## 2023-02-05 NOTE — Telephone Encounter (Signed)
Called patient regarding upcoming July appointments, patient is notified. 

## 2023-02-13 ENCOUNTER — Encounter: Payer: Self-pay | Admitting: Internal Medicine

## 2023-02-20 ENCOUNTER — Ambulatory Visit (HOSPITAL_COMMUNITY)
Admission: RE | Admit: 2023-02-20 | Discharge: 2023-02-20 | Disposition: A | Discharge status: Home / Self Care | Payer: Medicare Other | Source: Ambulatory Visit | Attending: Internal Medicine | Admitting: Internal Medicine

## 2023-02-20 DIAGNOSIS — C719 Malignant neoplasm of brain, unspecified: Secondary | ICD-10-CM | POA: Insufficient documentation

## 2023-02-20 MED ORDER — GADOBUTROL 1 MMOL/ML IV SOLN
7.0000 mL | Freq: Once | INTRAVENOUS | Status: AC | PRN
  Administered 2023-02-20: 7 mL via INTRAVENOUS

## 2023-02-24 ENCOUNTER — Other Ambulatory Visit: Payer: Self-pay | Admitting: *Deleted

## 2023-02-24 DIAGNOSIS — C719 Malignant neoplasm of brain, unspecified: Secondary | ICD-10-CM

## 2023-02-25 ENCOUNTER — Inpatient Hospital Stay (HOSPITAL_BASED_OUTPATIENT_CLINIC_OR_DEPARTMENT_OTHER): Payer: Medicare Other | Admitting: Internal Medicine

## 2023-02-25 ENCOUNTER — Inpatient Hospital Stay: Payer: Medicare Other

## 2023-02-25 ENCOUNTER — Inpatient Hospital Stay: Payer: Medicare Other | Attending: Internal Medicine

## 2023-02-25 VITALS — BP 122/75 | HR 61 | Temp 97.5°F | Resp 20 | Wt 157.5 lb

## 2023-02-25 VITALS — BP 123/70 | HR 62 | Resp 18

## 2023-02-25 DIAGNOSIS — C719 Malignant neoplasm of brain, unspecified: Secondary | ICD-10-CM

## 2023-02-25 DIAGNOSIS — Z5111 Encounter for antineoplastic chemotherapy: Secondary | ICD-10-CM | POA: Insufficient documentation

## 2023-02-25 DIAGNOSIS — R400 Somnolence: Secondary | ICD-10-CM | POA: Diagnosis not present

## 2023-02-25 DIAGNOSIS — C712 Malignant neoplasm of temporal lobe: Secondary | ICD-10-CM | POA: Diagnosis not present

## 2023-02-25 DIAGNOSIS — Z9221 Personal history of antineoplastic chemotherapy: Secondary | ICD-10-CM | POA: Insufficient documentation

## 2023-02-25 DIAGNOSIS — Z923 Personal history of irradiation: Secondary | ICD-10-CM | POA: Diagnosis not present

## 2023-02-25 DIAGNOSIS — R42 Dizziness and giddiness: Secondary | ICD-10-CM | POA: Insufficient documentation

## 2023-02-25 DIAGNOSIS — Z79630 Long term (current) use of alkylating agent: Secondary | ICD-10-CM | POA: Insufficient documentation

## 2023-02-25 LAB — CBC WITH DIFFERENTIAL (CANCER CENTER ONLY)
Abs Immature Granulocytes: 0.01 10*3/uL (ref 0.00–0.07)
Basophils Absolute: 0 10*3/uL (ref 0.0–0.1)
Basophils Relative: 0 %
Eosinophils Absolute: 0.1 10*3/uL (ref 0.0–0.5)
Eosinophils Relative: 3 %
HCT: 39.2 % (ref 39.0–52.0)
Hemoglobin: 13.8 g/dL (ref 13.0–17.0)
Immature Granulocytes: 0 %
Lymphocytes Relative: 34 %
Lymphs Abs: 1 10*3/uL (ref 0.7–4.0)
MCH: 32.2 pg (ref 26.0–34.0)
MCHC: 35.2 g/dL (ref 30.0–36.0)
MCV: 91.6 fL (ref 80.0–100.0)
Monocytes Absolute: 0.7 10*3/uL (ref 0.1–1.0)
Monocytes Relative: 23 %
Neutro Abs: 1.1 10*3/uL — ABNORMAL LOW (ref 1.7–7.7)
Neutrophils Relative %: 40 %
Platelet Count: 198 10*3/uL (ref 150–400)
RBC: 4.28 MIL/uL (ref 4.22–5.81)
RDW: 13.5 % (ref 11.5–15.5)
WBC Count: 2.9 10*3/uL — ABNORMAL LOW (ref 4.0–10.5)
nRBC: 0 % (ref 0.0–0.2)

## 2023-02-25 LAB — CMP (CANCER CENTER ONLY)
ALT: 35 U/L (ref 0–44)
AST: 31 U/L (ref 15–41)
Albumin: 4.3 g/dL (ref 3.5–5.0)
Alkaline Phosphatase: 148 U/L — ABNORMAL HIGH (ref 38–126)
Anion gap: 6 (ref 5–15)
BUN: 12 mg/dL (ref 8–23)
CO2: 28 mmol/L (ref 22–32)
Calcium: 9.5 mg/dL (ref 8.9–10.3)
Chloride: 102 mmol/L (ref 98–111)
Creatinine: 0.8 mg/dL (ref 0.61–1.24)
GFR, Estimated: >60 mL/min (ref >60)
Glucose, Bld: 86 mg/dL (ref 70–99)
Potassium: 4.3 mmol/L (ref 3.5–5.1)
Sodium: 136 mmol/L (ref 135–145)
Total Bilirubin: 0.5 mg/dL (ref 0.3–1.2)
Total Protein: 7.2 g/dL (ref 6.5–8.1)

## 2023-02-25 LAB — TOTAL PROTEIN, URINE DIPSTICK: Protein, ur: NEGATIVE mg/dL

## 2023-02-25 MED ORDER — LEVETIRACETAM 750 MG PO TABS: 750.0000 mg | ORAL_TABLET | Freq: Two times a day (BID) | ORAL | 3 refills | Status: DC

## 2023-02-25 MED ORDER — ATROPINE SULFATE 1 MG/ML IV SOLN
0.5000 mg | Freq: Once | INTRAVENOUS | Status: AC | PRN
  Administered 2023-02-25: 0.5 mg via INTRAVENOUS
  Filled 2023-02-25: qty 1

## 2023-02-25 MED ORDER — PALONOSETRON HCL INJECTION 0.25 MG/5ML
0.2500 mg | Freq: Once | INTRAVENOUS | Status: AC
  Administered 2023-02-25: 0.25 mg via INTRAVENOUS
  Filled 2023-02-25: qty 5

## 2023-02-25 MED ORDER — SODIUM CHLORIDE 0.9 % IV SOLN
10.0000 mg/kg | Freq: Once | INTRAVENOUS | Status: AC
  Administered 2023-02-25: 700 mg via INTRAVENOUS
  Filled 2023-02-25: qty 16

## 2023-02-25 MED ORDER — SODIUM CHLORIDE 0.9 % IV SOLN
10.0000 mg | Freq: Once | INTRAVENOUS | Status: AC
  Administered 2023-02-25: 10 mg via INTRAVENOUS
  Filled 2023-02-25: qty 10

## 2023-02-25 MED ORDER — SODIUM CHLORIDE 0.9 % IV SOLN: Freq: Once | INTRAVENOUS | Status: AC

## 2023-02-25 MED ORDER — SODIUM CHLORIDE 0.9 % IV SOLN
125.0000 mg/m2 | Freq: Once | INTRAVENOUS | Status: AC
  Administered 2023-02-25: 220 mg via INTRAVENOUS
  Filled 2023-02-25: qty 11

## 2023-02-25 NOTE — Progress Notes (Signed)
[facility] at [facility] [address]  [place], [place] [address] [phone]   Interval Evaluation  Date of Service: 02/25/23 Patient Name: Rayson Rando Patient MRN: [id number] Patient DOB: August 29, 1960 Provider: Henreitta Leber, MD  Identifying Statement:  Ayham Word is a 62 y.o. male with left temporal glioblastoma   Oncologic History: Oncology History  Glioblastoma, IDH-wildtype (HCC)  06/15/2020 Surgery   Stereotactic biopsy, L temporal Maisie Fus).  Path demonstrates glioblastoma IDH-1 wt   08/02/2020 Surgery   Debulking craniotomy with Dr. Maisie Fus   09/05/2020 - 10/17/2020 Radiation Therapy   IMRT with concurrent Temozolomide [name])   11/19/2020 - 06/18/2021 Chemotherapy   Completes #7 cycles adjuvant 5-day Temozolomide       07/17/2021 Progression   Progression of disease #1   07/18/2021 - 07/10/2022 Chemotherapy   Completes 8 cycles of CCNU 90mg /m2 PO q6 weeks and Avastin 10mg /kg IV q2 weeks   07/11/2022 Progression   Progression of disease #2   09/05/2022 Surgery   Repeat left temporal craniotomy with Dr. Maisie Fus; path is Glioblastoma IDHwt   09/19/2022 -  Chemotherapy   Initiates metronomic Temozolomide, 50mg /m2 PO daily   09/23/2022 - 09/23/2022 Chemotherapy   Patient is on Treatment Plan : BRAIN Low Grade Glioma Grade II, Glioblastoma,  Astrocytoma, Oligodendroma, Recurrent or Progressive / Temozolomide D1-5 Q28 Days     11/04/2022 -  Chemotherapy   Patient is on Treatment Plan : BRAIN GBM [facility] Irinotecan D1,15 + Bevacizumab D1,15 q 28d       Biomarkers:  MGMT Unknown.  IDH 1/2 Wild type.  EGFR Unknown  TERT "Mutated   Interval History: Nathin Saran presents today for avastin and irinotecan infusion.  No formal complaints today.  No balance issues, motor dysfunction.  Continues on Keppra 750mg  BID, no headaches.  H+P (07/24/20) Patient presented to medical attention in late October, 2021 with new onset seizure.  Event was  decribed as loss of consciousness, sudden, without clarity on further details aside from altered mental status upon awakening.  CNS imaging demonsrated non-enhancing mass within left temporal lobe c/w likely glioma; he underwent stereotactic biopsy with Dr. Maisie Fus on 06/15/20.  There was significant delay in finalizing path, explaining delay in follow up and evaluation.  He denies any seizures since discharge from hospital, on 4 anti-seizure drugs.  He does complain of dizziness and drowsiness with the medications, however.  Also describes impaired short term memory.  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    Medications: Current Outpatient Medications on File Prior to Visit  Medication Sig Dispense Refill   acetaminophen (TYLENOL) 500 MG tablet Take 500 mg by mouth every 6 (six) hours as needed for moderate pain.     loperamide (IMODIUM) 2 MG capsule Take 2 tabs by mouth with first loose stool, then 1 tab with each additional loose stool as needed. Do not exceed 8 tabs in a 24-hour period 30 capsule 1   Multiple Vitamin (MULTIVITAMIN WITH MINERALS) TABS tablet Take 1 tablet by mouth daily.     ondansetron (ZOFRAN) 8 MG tablet Take 1 tablet (8 mg total) by mouth every 8 (eight) hours as needed for nausea, vomiting or refractory nausea / vomiting. Start on the third day after chemotherapy. 30 tablet 1   [DISCONTINUED] lacosamide (VIMPAT) 200 MG TABS tablet Take 0.5 tablets (100 mg total) by mouth 2 (two) times daily. 30 tablet 0   No current facility-administered medications on file prior to visit.  Allergies: No Known Allergies Past Medical History:  Past Medical History:  Diagnosis Date   Cancer (HCC)    BRAIN TUMOR   Headache    Seizure [facility])    Past Surgical History:  Past Surgical History:  Procedure Laterality Date   APPLICATION OF CRANIAL NAVIGATION N/A 06/15/2020   Procedure: APPLICATION OF CRANIAL NAVIGATION;  Surgeon: Bedelia Person, MD;  Location: [facility] OR;  Service:  Neurosurgery;  Laterality: N/A;   APPLICATION OF CRANIAL NAVIGATION N/A 08/02/2020   Procedure: APPLICATION OF CRANIAL NAVIGATION;  Surgeon: Bedelia Person, MD;  Location: [facility] OR;  Service: Neurosurgery;  Laterality: N/A;   APPLICATION OF CRANIAL NAVIGATION Left 09/05/2022   Procedure: APPLICATION OF CRANIAL NAVIGATION;  Surgeon: Bedelia Person, MD;  Location: [facility] OR;  Service: Neurosurgery;  Laterality: Left;   CRANIOTOMY N/A 08/02/2020   Procedure: CRANIOTOMY LEFT TEMPORAL LOBECTOMY FOR TUMOR EXCISION;  Surgeon: Bedelia Person, MD;  Location: [facility] OR;  Service: Neurosurgery;  Laterality: N/A;   CRANIOTOMY Left 09/05/2022   Procedure: CRANIOTOMY FOR RESECTION OF TEMPORAL LOBE TUMOR;  Surgeon: Bedelia Person, MD;  Location: [facility] OR;  Service: Neurosurgery;  Laterality: Left;   FRAMELESS  BIOPSY WITH BRAINLAB Left 06/15/2020   Procedure: Left temporal lobe stereotactic brain biopsy with brainlab;  Surgeon: Bedelia Person, MD;  Location: [facility] OR;  Service: Neurosurgery;  Laterality: Left;   Social History:  Social History   Socioeconomic History   Marital status: Married    Spouse name: Not on file   Number of children: Not on file   Years of education: Not on file   Highest education level: Not on file  Occupational History   Not on file  Tobacco Use   Smoking status: Never   Smokeless tobacco: Never  Vaping Use   Vaping status: Never Used  Substance and Sexual Activity   Alcohol use: Yes    Comment: 1 beer a month   Drug use: Never   Sexual activity: Yes    Partners: Female  Other Topics Concern   Not on file  Social History Narrative   Not on file   Social Determinants of Health   Financial Resource Strain: Not on file  Food Insecurity: Not on file  Transportation Needs: Not on file  Physical Activity: Not on file  Stress: Not on file  Social Connections: Unknown (12/24/2021)   [facility]   Social Network    Social Network: Not on file   Intimate Partner Violence: Unknown (11/13/2021)   Received from [facility]   HITS    Physically Hurt: Not on file    Insult or Talk Down To: Not on file    Threaten Physical Harm: Not on file    Scream or Curse: Not on file   Family History:  Family History  Problem Relation Age of Onset   Cancer Neg Hx     Review of Systems: Constitutional: Doesn't report fevers, chills or abnormal weight loss Eyes: Doesn't report blurriness of vision Ears, nose, mouth, throat, and face: Doesn't report sore throat Respiratory: Doesn't report cough, dyspnea or wheezes Cardiovascular: Doesn't report palpitation, chest discomfort  Gastrointestinal:  Doesn't report nausea, constipation, diarrhea GU: Doesn't report incontinence Skin: Doesn't report skin rashes Neurological: Per HPI Musculoskeletal: Doesn't report joint pain Behavioral/Psych: Doesn't report anxiety  Physical Exam: Vitals:   02/25/23 1105  BP: 122/75  Pulse: 61  Resp: 20  Temp: (!) 97.5 F (36.4 C)  SpO2: 99%  KPS: 80. General: Alert, cooperative, pleasant, in no acute distress Head: Normal EENT: No conjunctival injection or scleral icterus.  Lungs: Resp effort normal Cardiac: Regular rate Abdomen: Non-distended abdomen Skin: No rashes cyanosis or petechiae. Extremities: No clubbing or edema  Neurologic Exam: Mental Status: Awake, alert, attentive to examiner. Oriented to self and environment. Language has mildly impaired fluency with intact comprehension.  Cranial Nerves: Visual acuity is grossly normal. Visual fields are full. Extra-ocular movements intact. No ptosis. Face is symmetric Motor: Tone and bulk are normal. Power is full in both arms and legs. Reflexes are symmetric, no pathologic reflexes present.  Sensory: Intact to light touch Gait: Normal.   Labs: I have reviewed the data as listed    Component Value Date/Time   NA 136 02/25/2023 1053   K 4.3 02/25/2023 1053   CL 102 02/25/2023 1053   CO2  28 02/25/2023 1053   GLUCOSE 86 02/25/2023 1053   BUN 12 02/25/2023 1053   CREATININE 0.80 02/25/2023 1053   CALCIUM 9.5 02/25/2023 1053   PROT 7.2 02/25/2023 1053   ALBUMIN 4.3 02/25/2023 1053   AST 31 02/25/2023 1053   ALT 35 02/25/2023 1053   ALKPHOS 148 (H) 02/25/2023 1053   BILITOT 0.5 02/25/2023 1053   GFRNONAA >60 02/25/2023 1053   Lab Results  Component Value Date   WBC 2.9 (L) 02/25/2023   NEUTROABS 1.1 (L) 02/25/2023   HGB 13.8 02/25/2023   HCT 39.2 02/25/2023   MCV 91.6 02/25/2023   PLT 198 02/25/2023   Imaging:  [facility] Clinician Interpretation: I have personally reviewed the CNS images as listed.  My interpretation, in the context of the patient's clinical presentation, is progressive disease pending official read  No results found.  Assessment/Plan Glioblastoma, IDH-wildtype (HCC) - Plan: Infusion Appointment Request, Clinic Appointment Request, Lab Appointment Request, CBC with Differential (Cancer Center Only), CMP (Cancer Center only), MR BRAIN W WO CONTRAST  Weiland Heinze presents today following MRI for for CPT-11 and Avastin.  MRI brain demonstrates mixed findings, with stable T2/FLAIR burden but some increase in nodularity surrounding the resection cavity.  Findings could be consistent with progression of disease but represent less than 20% change in cross sectional diameter.  Will recommend proceeding with cycle #4, day 1 of Irinotecan 125mg /m2 IV q2 weeks, with concurrent Avastin 10mg /kg IV q2 weeks.  We reviewed side effects, including diarrhea.  Avastin has been previously well tolerated.  Imodium will be provided for use at home if needed.  Schedule will transition to q3 weeks due to ctyopenias.  Chemotherapy should be held for the following:  ANC less than 1,000  Platelets less than 100,000  LFT or creatinine greater than 2x ULN  If clinical concerns/contraindications develop  Avastin should be held for the following:  ANC less than 500   Platelets less than 50,000  LFT or creatinine greater than 2x ULN  If clinical concerns/contraindications develop  Keppra will stay at 750mg  BID for now.  We appreciate the opportunity to participate in the care of Lani [place].     We ask that Oland Arquette return in 3 with labs for evaluation prior to cycle 4, day 21 CPT-11 and avastin, or sooner as needed.  MRI will be repeated in 1 month given findings reviewed today.  All questions were answered. The patient knows to call the clinic with any problems, questions or concerns. No barriers to learning were detected.  The total time spent in the encounter was 40 minutes and more than  50% was on counseling and review of test results   Henreitta Leber, MD Medical Director of Neuro-Oncology [facility] at [facility] 02/25/23 12:00 PM

## 2023-02-25 NOTE — Patient Instructions (Signed)
 Instrucciones al darle de alta: Discharge Instructions Gracias por elegir al [facility] de Cncer de [place] para brindarle atencin mdica de oncologa y Teacher, English as a foreign language.   Si usted tiene una cita de laboratorio con [employer] de [place], por favor vaya directamente al [employer] de Cncer y regstrese en el rea de Engineer, maintenance (IT).   Use ropa cmoda y [place] para tener fcil acceso a las vas del Portacath (acceso venoso de Set designer duracin) o la lnea PICC (catter central colocado por va perifrica).   Nos esforzamos por ofrecerle tiempo de calidad con su proveedor. Es posible que tenga que volver a programar su cita si llega tarde (15 minutos o ms).  El llegar tarde le afecta a usted y a otros pacientes cuyas citas son posteriores a Armed forces operational officer.  Adems, si usted falta a tres o ms citas sin avisar a la oficina, puede ser retirado(a) de la clnica a discrecin del proveedor.      Para las solicitudes de renovacin de recetas, pida a su farmacia que se ponga en contacto con nuestra oficina y deje que transcurran 72 horas para que se complete el proceso de las renovaciones.    Hoy usted recibi los siguientes agentes de quimioterapia e/o inmunoterapia: Bevacizumab (VEGZELMA) & Irinotecan      Para ayudar a prevenir las nuseas y los vmitos despus de su tratamiento, le recomendamos que tome su medicamento para las nuseas segn las indicaciones.  LOS SNTOMAS QUE DEBEN COMUNICARSE INMEDIATAMENTE SE INDICAN A CONTINUACIN: *FIEBRE SUPERIOR A 100.4 F (38 C) O MS *ESCALOFROS O SUDORACIN *NUSEAS Y VMITOS QUE NO SE CONTROLAN CON EL MEDICAMENTO PARA LAS NUSEAS *DIFICULTAD INUSUAL PARA RESPIRAR  *MORETONES O HEMORRAGIAS NO HABITUALES *PROBLEMAS URINARIOS (dolor o ardor al Geographical information systems officer o frecuencia para Geographical information systems officer) *PROBLEMAS INTESTINALES (diarrea inusual, estreimiento, dolor cerca del ano) SENSIBILIDAD EN LA BOCA Y EN LA GARGANTA CON O SIN LA PRESENCIA DE LCERAS (dolor de garganta, llagas en la boca o dolor de  muelas/dientes) ERUPCIN, HINCHAZN O DOLORES INUSUALES FLUJO VAGINAL INUSUAL O PICAZN/RASQUIA    Los puntos marcados con un asterisco ( *) indican una posible emergencia y debe hacer un seguimiento tan pronto como le sea posible o vaya al Departamento de Emergencias si se le presenta algn problema.  Por favor, muestre la [place] DE ADVERTENCIA DE Marc Morgans DE ADVERTENCIA DE Gardiner Fanti al registrarse en [address] de Emergencias y a la enfermera de triaje.  Si tiene preguntas despus de su visita o necesita cancelar o volver a programar su cita, por favor pngase en contacto con [name]  Dept: [phone] y siga las instrucciones. Las horas de oficina son de 8:00 a.m. a 4:30 p.m. de lunes a viernes. Por favor, tenga en cuenta que los mensajes de voz que se dejan despus de las 4:00 p.m. posiblemente no se devolvern hasta el siguiente da de [place].  Cerramos los fines de semana y [employer]. En todo momento tiene acceso a una enfermera para preguntas urgentes. Por favor, llame al nmero principal de la clnica Dept: [phone] y siga las instrucciones.   Para cualquier pregunta que no sea de carcter urgente, tambin puede ponerse en contacto con su proveedor [employer]. Ahora ofrecemos visitas electrnicas para cualquier persona mayor de 18 aos que solicite atencin mdica en lnea para los sntomas que no sean urgentes. Para ms detalles vaya a [email or web address].   Tambin puede bajar la aplicacin de MyChart! Vaya a la tienda de aplicaciones, busque "  MyChart", abra la aplicacin, seleccione [place], e ingrese con su nombre de usuario y la contrasea de Clinical cytogeneticist.

## 2023-02-27 ENCOUNTER — Other Ambulatory Visit: Payer: Self-pay

## 2023-03-17 ENCOUNTER — Other Ambulatory Visit: Payer: Self-pay | Admitting: *Deleted

## 2023-03-17 DIAGNOSIS — C719 Malignant neoplasm of brain, unspecified: Secondary | ICD-10-CM

## 2023-03-18 ENCOUNTER — Inpatient Hospital Stay: Payer: Medicare Other | Attending: Internal Medicine

## 2023-03-18 ENCOUNTER — Inpatient Hospital Stay: Payer: Medicare Other

## 2023-03-18 ENCOUNTER — Telehealth: Payer: Self-pay | Admitting: *Deleted

## 2023-03-18 ENCOUNTER — Telehealth: Payer: Self-pay | Admitting: Internal Medicine

## 2023-03-18 ENCOUNTER — Other Ambulatory Visit: Payer: Self-pay

## 2023-03-18 ENCOUNTER — Inpatient Hospital Stay (HOSPITAL_BASED_OUTPATIENT_CLINIC_OR_DEPARTMENT_OTHER): Payer: Medicare Other | Admitting: Internal Medicine

## 2023-03-18 VITALS — BP 129/83 | HR 63 | Temp 97.9°F | Resp 16 | Wt 156.1 lb

## 2023-03-18 VITALS — BP 125/74 | HR 55 | Temp 97.6°F | Resp 16

## 2023-03-18 DIAGNOSIS — R400 Somnolence: Secondary | ICD-10-CM | POA: Insufficient documentation

## 2023-03-18 DIAGNOSIS — Z5189 Encounter for other specified aftercare: Secondary | ICD-10-CM | POA: Insufficient documentation

## 2023-03-18 DIAGNOSIS — Z79899 Other long term (current) drug therapy: Secondary | ICD-10-CM | POA: Insufficient documentation

## 2023-03-18 DIAGNOSIS — Z923 Personal history of irradiation: Secondary | ICD-10-CM | POA: Insufficient documentation

## 2023-03-18 DIAGNOSIS — C719 Malignant neoplasm of brain, unspecified: Secondary | ICD-10-CM

## 2023-03-18 DIAGNOSIS — Z9221 Personal history of antineoplastic chemotherapy: Secondary | ICD-10-CM | POA: Diagnosis not present

## 2023-03-18 DIAGNOSIS — C712 Malignant neoplasm of temporal lobe: Secondary | ICD-10-CM | POA: Diagnosis not present

## 2023-03-18 DIAGNOSIS — D709 Neutropenia, unspecified: Secondary | ICD-10-CM | POA: Diagnosis not present

## 2023-03-18 DIAGNOSIS — G4089 Other seizures: Secondary | ICD-10-CM | POA: Insufficient documentation

## 2023-03-18 DIAGNOSIS — R42 Dizziness and giddiness: Secondary | ICD-10-CM | POA: Diagnosis not present

## 2023-03-18 DIAGNOSIS — Z5111 Encounter for antineoplastic chemotherapy: Secondary | ICD-10-CM | POA: Insufficient documentation

## 2023-03-18 DIAGNOSIS — R569 Unspecified convulsions: Secondary | ICD-10-CM

## 2023-03-18 LAB — CBC WITH DIFFERENTIAL (CANCER CENTER ONLY)
Abs Immature Granulocytes: 0 10*3/uL (ref 0.00–0.07)
Basophils Absolute: 0 10*3/uL (ref 0.0–0.1)
Basophils Relative: 0 %
Eosinophils Absolute: 0.1 10*3/uL (ref 0.0–0.5)
Eosinophils Relative: 3 %
HCT: 39.7 % (ref 39.0–52.0)
Hemoglobin: 13.9 g/dL (ref 13.0–17.0)
Immature Granulocytes: 0 %
Lymphocytes Relative: 36 %
Lymphs Abs: 0.9 10*3/uL (ref 0.7–4.0)
MCH: 31.4 pg (ref 26.0–34.0)
MCHC: 35 g/dL (ref 30.0–36.0)
MCV: 89.8 fL (ref 80.0–100.0)
Monocytes Absolute: 0.5 10*3/uL (ref 0.1–1.0)
Monocytes Relative: 18 %
Neutro Abs: 1 10*3/uL — ABNORMAL LOW (ref 1.7–7.7)
Neutrophils Relative %: 43 %
Platelet Count: 186 10*3/uL (ref 150–400)
RBC: 4.42 MIL/uL (ref 4.22–5.81)
RDW: 12.8 % (ref 11.5–15.5)
WBC Count: 2.5 10*3/uL — ABNORMAL LOW (ref 4.0–10.5)
nRBC: 0 % (ref 0.0–0.2)

## 2023-03-18 LAB — CMP (CANCER CENTER ONLY)
ALT: 29 U/L (ref 0–44)
AST: 27 U/L (ref 15–41)
Albumin: 4.2 g/dL (ref 3.5–5.0)
Alkaline Phosphatase: 124 U/L (ref 38–126)
Anion gap: 6 (ref 5–15)
BUN: 9 mg/dL (ref 8–23)
CO2: 27 mmol/L (ref 22–32)
Calcium: 9.3 mg/dL (ref 8.9–10.3)
Chloride: 104 mmol/L (ref 98–111)
Creatinine: 0.8 mg/dL (ref 0.61–1.24)
GFR, Estimated: >60 mL/min (ref >60)
Glucose, Bld: 131 mg/dL — ABNORMAL HIGH (ref 70–99)
Potassium: 4.2 mmol/L (ref 3.5–5.1)
Sodium: 137 mmol/L (ref 135–145)
Total Bilirubin: 0.5 mg/dL (ref 0.3–1.2)
Total Protein: 6.9 g/dL (ref 6.5–8.1)

## 2023-03-18 LAB — TOTAL PROTEIN, URINE DIPSTICK: Protein, ur: NEGATIVE mg/dL

## 2023-03-18 MED ORDER — SODIUM CHLORIDE 0.9 % IV SOLN
10.0000 mg/kg | Freq: Once | INTRAVENOUS | Status: AC
  Administered 2023-03-18: 700 mg via INTRAVENOUS
  Filled 2023-03-18: qty 12

## 2023-03-18 MED ORDER — ATROPINE SULFATE 1 MG/ML IV SOLN
0.5000 mg | Freq: Once | INTRAVENOUS | Status: AC | PRN
  Administered 2023-03-18: 0.5 mg via INTRAVENOUS
  Filled 2023-03-18: qty 1

## 2023-03-18 MED ORDER — PALONOSETRON HCL INJECTION 0.25 MG/5ML
0.2500 mg | Freq: Once | INTRAVENOUS | Status: AC
  Administered 2023-03-18: 0.25 mg via INTRAVENOUS
  Filled 2023-03-18: qty 5

## 2023-03-18 MED ORDER — SODIUM CHLORIDE 0.9 % IV SOLN
10.0000 mg | Freq: Once | INTRAVENOUS | Status: AC
  Administered 2023-03-18: 10 mg via INTRAVENOUS
  Filled 2023-03-18: qty 10

## 2023-03-18 MED ORDER — SODIUM CHLORIDE 0.9 % IV SOLN: Freq: Once | INTRAVENOUS | Status: AC

## 2023-03-18 MED ORDER — IRINOTECAN HCL CHEMO INJECTION 100 MG/5ML
125.0000 mg/m2 | Freq: Once | INTRAVENOUS | Status: AC
  Administered 2023-03-18: 220 mg via INTRAVENOUS
  Filled 2023-03-18: qty 11

## 2023-03-18 NOTE — Patient Instructions (Signed)
 Instrucciones al darle de alta: Discharge Instructions Gracias por elegir al [facility] de Cncer de [place] para brindarle atencin mdica de oncologa y Teacher, English as a foreign language.   Si usted tiene una cita de laboratorio con [employer] de [place], por favor vaya directamente al [employer] de Cncer y regstrese en el rea de Engineer, maintenance (IT).   Use ropa cmoda y [place] para tener fcil acceso a las vas del Portacath (acceso venoso de Set designer duracin) o la lnea PICC (catter central colocado por va perifrica).   Nos esforzamos por ofrecerle tiempo de calidad con su proveedor. Es posible que tenga que volver a programar su cita si llega tarde (15 minutos o ms).  El llegar tarde le afecta a usted y a otros pacientes cuyas citas son posteriores a Armed forces operational officer.  Adems, si usted falta a tres o ms citas sin avisar a la oficina, puede ser retirado(a) de la clnica a discrecin del proveedor.      Para las solicitudes de renovacin de recetas, pida a su farmacia que se ponga en contacto con nuestra oficina y deje que transcurran 72 horas para que se complete el proceso de las renovaciones.    Hoy usted recibi los siguientes agentes de quimioterapia e/o inmunoterapia: Bevacizumab (VEGZELMA) & Irinotecan      Para ayudar a prevenir las nuseas y los vmitos despus de su tratamiento, le recomendamos que tome su medicamento para las nuseas segn las indicaciones.  LOS SNTOMAS QUE DEBEN COMUNICARSE INMEDIATAMENTE SE INDICAN A CONTINUACIN: *FIEBRE SUPERIOR A 100.4 F (38 C) O MS *ESCALOFROS O SUDORACIN *NUSEAS Y VMITOS QUE NO SE CONTROLAN CON EL MEDICAMENTO PARA LAS NUSEAS *DIFICULTAD INUSUAL PARA RESPIRAR  *MORETONES O HEMORRAGIAS NO HABITUALES *PROBLEMAS URINARIOS (dolor o ardor al Geographical information systems officer o frecuencia para Geographical information systems officer) *PROBLEMAS INTESTINALES (diarrea inusual, estreimiento, dolor cerca del ano) SENSIBILIDAD EN LA BOCA Y EN LA GARGANTA CON O SIN LA PRESENCIA DE LCERAS (dolor de garganta, llagas en la boca o dolor de  muelas/dientes) ERUPCIN, HINCHAZN O DOLORES INUSUALES FLUJO VAGINAL INUSUAL O PICAZN/RASQUIA    Los puntos marcados con un asterisco ( *) indican una posible emergencia y debe hacer un seguimiento tan pronto como le sea posible o vaya al Departamento de Emergencias si se le presenta algn problema.  Por favor, muestre la [place] DE ADVERTENCIA DE Marc Morgans DE ADVERTENCIA DE Gardiner Fanti al registrarse en [address] de Emergencias y a la enfermera de triaje.  Si tiene preguntas despus de su visita o necesita cancelar o volver a programar su cita, por favor pngase en contacto con [name]  Dept: [phone] y siga las instrucciones. Las horas de oficina son de 8:00 a.m. a 4:30 p.m. de lunes a viernes. Por favor, tenga en cuenta que los mensajes de voz que se dejan despus de las 4:00 p.m. posiblemente no se devolvern hasta el siguiente da de [place].  Cerramos los fines de semana y [employer]. En todo momento tiene acceso a una enfermera para preguntas urgentes. Por favor, llame al nmero principal de la clnica Dept: [phone] y siga las instrucciones.   Para cualquier pregunta que no sea de carcter urgente, tambin puede ponerse en contacto con su proveedor [employer]. Ahora ofrecemos visitas electrnicas para cualquier persona mayor de 18 aos que solicite atencin mdica en lnea para los sntomas que no sean urgentes. Para ms detalles vaya a [email or web address].   Tambin puede bajar la aplicacin de MyChart! Vaya a la tienda de aplicaciones, busque "  MyChart", abra la aplicacin, seleccione [place], e ingrese con su nombre de usuario y la contrasea de Clinical cytogeneticist.

## 2023-03-18 NOTE — Progress Notes (Signed)
[facility] [address]  [place], [place] [address] [phone]   Interval Evaluation  Date of Service: 03/18/23 [name] Patient MRN: [id number] Patient DOB: September 28, 1960 Provider: Henreitta Leber, MD  Identifying Statement:  Hunter Terry is a 62 y.o. male with left temporal glioblastoma   Oncologic History: Oncology History  Glioblastoma, IDH-wildtype (HCC)  06/15/2020 Surgery   Stereotactic biopsy, L temporal Maisie Fus).  Path demonstrates glioblastoma IDH-1 wt   08/02/2020 Surgery   Debulking craniotomy with Dr. Maisie Fus   09/05/2020 - 10/17/2020 Radiation Therapy   IMRT with concurrent Temozolomide [name])   11/19/2020 - 06/18/2021 Chemotherapy   Completes #7 cycles adjuvant 5-day Temozolomide       07/17/2021 Progression   Progression of disease #1   07/18/2021 - 07/10/2022 Chemotherapy   Completes 8 cycles of CCNU 90mg /m2 PO q6 weeks and Avastin 10mg /kg IV q2 weeks   07/11/2022 Progression   Progression of disease #2   09/05/2022 Surgery   Repeat left temporal craniotomy with Dr. Maisie Fus; path is Glioblastoma IDHwt   09/19/2022 -  Chemotherapy   Initiates metronomic Temozolomide, 50mg /m2 PO daily   09/23/2022 - 09/23/2022 Chemotherapy   Patient is on Treatment Plan : BRAIN Low Grade Glioma Grade II, Glioblastoma,  Astrocytoma, Oligodendroma, Recurrent or Progressive / Temozolomide D1-5 Q28 Days     11/04/2022 -  Chemotherapy   Patient is on Treatment Plan : BRAIN GBM [facility] Irinotecan D1,15 + Bevacizumab D1,15 q 28d       Biomarkers:  MGMT Unknown.  IDH 1/2 Wild type.  EGFR Unknown  TERT "Mutated   Interval History: [name] presents today for avastin and irinotecan infusion.  No formal complaints today.  No balance issues, motor dysfunction.  Continues on Keppra 750mg  BID, no headaches.  H+P (07/24/20) Patient presented to medical attention in late October, 2021 with new onset seizure.  Event was  decribed as loss of consciousness, sudden, without clarity on further details aside from altered mental status upon awakening.  CNS imaging demonsrated non-enhancing mass within left temporal lobe c/w likely glioma; he underwent stereotactic biopsy with Dr. Maisie Fus on 06/15/20.  There was significant delay in finalizing path, explaining delay in follow up and evaluation.  He denies any seizures since discharge from hospital, on 4 anti-seizure drugs.  He does complain of dizziness and drowsiness with the medications, however.  Also describes impaired short term memory.  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    Medications: Current Outpatient Medications on File Prior to Visit  Medication Sig Dispense Refill   acetaminophen (TYLENOL) 500 MG tablet Take 500 mg by mouth every 6 (six) hours as needed for moderate pain.     levETIRAcetam (KEPPRA) 750 MG tablet Take 1 tablet (750 mg total) by mouth 2 (two) times daily. 60 tablet 3   loperamide (IMODIUM) 2 MG capsule Take 2 tabs by mouth with first loose stool, then 1 tab with each additional loose stool as needed. Do not exceed 8 tabs in a 24-hour period 30 capsule 1   Multiple Vitamin (MULTIVITAMIN WITH MINERALS) TABS tablet Take 1 tablet by mouth daily.     ondansetron (ZOFRAN) 8 MG tablet Take 1 tablet (8 mg total) by mouth every 8 (eight) hours as needed for nausea, vomiting or refractory nausea / vomiting. Start on the third day after chemotherapy. 30 tablet 1   [DISCONTINUED] lacosamide (VIMPAT) 200 MG TABS tablet Take 0.5 tablets (100 mg total) by  mouth 2 (two) times daily. 30 tablet 0   No current facility-administered medications on file prior to visit.    Allergies: No Known Allergies Past Medical History:  Past Medical History:  Diagnosis Date   Cancer (HCC)    BRAIN TUMOR   Headache    Seizure [facility])    Past Surgical History:  Past Surgical History:  Procedure Laterality Date   APPLICATION OF CRANIAL NAVIGATION N/A 06/15/2020    Procedure: APPLICATION OF CRANIAL NAVIGATION;  Surgeon: Bedelia Person, MD;  Location: [facility] OR;  Service: Neurosurgery;  Laterality: N/A;   APPLICATION OF CRANIAL NAVIGATION N/A 08/02/2020   Procedure: APPLICATION OF CRANIAL NAVIGATION;  Surgeon: Bedelia Person, MD;  Location: [facility] OR;  Service: Neurosurgery;  Laterality: N/A;   APPLICATION OF CRANIAL NAVIGATION Left 09/05/2022   Procedure: APPLICATION OF CRANIAL NAVIGATION;  Surgeon: Bedelia Person, MD;  Location: [facility] OR;  Service: Neurosurgery;  Laterality: Left;   CRANIOTOMY N/A 08/02/2020   Procedure: CRANIOTOMY LEFT TEMPORAL LOBECTOMY FOR TUMOR EXCISION;  Surgeon: Bedelia Person, MD;  Location: [facility] OR;  Service: Neurosurgery;  Laterality: N/A;   CRANIOTOMY Left 09/05/2022   Procedure: CRANIOTOMY FOR RESECTION OF TEMPORAL LOBE TUMOR;  Surgeon: Bedelia Person, MD;  Location: [facility] OR;  Service: Neurosurgery;  Laterality: Left;   FRAMELESS  BIOPSY WITH BRAINLAB Left 06/15/2020   Procedure: Left temporal lobe stereotactic brain biopsy with brainlab;  Surgeon: Bedelia Person, MD;  Location: [facility] OR;  Service: Neurosurgery;  Laterality: Left;   Social History:  Social History   Socioeconomic History   Marital status: Married    Spouse name: Not on file   Number of children: Not on file   Years of education: Not on file   Highest education level: Not on file  Occupational History   Not on file  Tobacco Use   Smoking status: Never   Smokeless tobacco: Never  Vaping Use   Vaping status: Never Used  Substance and Sexual Activity   Alcohol use: Yes    Comment: 1 beer a month   Drug use: Never   Sexual activity: Yes    Partners: Female  Other Topics Concern   Not on file  Social History Narrative   Not on file   Social Determinants of Health   Financial Resource Strain: Not on file  Food Insecurity: Not on file  Transportation Needs: Not on file  Physical Activity: Not on file  Stress: Not on file  Social  Connections: Unknown (12/24/2021)   Received from [facility]   Social Network    Social Network: Not on file  Intimate Partner Violence: Unknown (11/13/2021)   Received from [facility]   HITS    Physically Hurt: Not on file    Insult or Talk Down To: Not on file    Threaten Physical Harm: Not on file    Scream or Curse: Not on file   Family History:  Family History  Problem Relation Age of Onset   Cancer Neg Hx     Review of Systems: Constitutional: Doesn't report fevers, chills or abnormal weight loss Eyes: Doesn't report blurriness of vision Ears, nose, mouth, throat, and face: Doesn't report sore throat Respiratory: Doesn't report cough, dyspnea or wheezes Cardiovascular: Doesn't report palpitation, chest discomfort  Gastrointestinal:  Doesn't report nausea, constipation, diarrhea GU: Doesn't report incontinence Skin: Doesn't report skin rashes Neurological: Per HPI Musculoskeletal: Doesn't report joint pain Behavioral/Psych: Doesn't report anxiety  Physical Exam: Vitals:   03/18/23  0851  BP: 129/83  Pulse: 63  Resp: 16  Temp: 97.9 F (36.6 C)  SpO2: 100%   KPS: 80. General: Alert, cooperative, pleasant, in no acute distress Head: Normal EENT: No conjunctival injection or scleral icterus.  Lungs: Resp effort normal Cardiac: Regular rate Abdomen: Non-distended abdomen Skin: No rashes cyanosis or petechiae. Extremities: No clubbing or edema  Neurologic Exam: Mental Status: Awake, alert, attentive to examiner. Oriented to self and environment. Language has mildly impaired fluency with intact comprehension.  Cranial Nerves: Visual acuity is grossly normal. Visual fields are full. Extra-ocular movements intact. No ptosis. Face is symmetric Motor: Tone and bulk are normal. Power is full in both arms and legs. Reflexes are symmetric, no pathologic reflexes present.  Sensory: Intact to light touch Gait: Normal.   Labs: I have reviewed the data as listed     Component Value Date/Time   NA 136 02/25/2023 1053   K 4.3 02/25/2023 1053   CL 102 02/25/2023 1053   CO2 28 02/25/2023 1053   GLUCOSE 86 02/25/2023 1053   BUN 12 02/25/2023 1053   CREATININE 0.80 02/25/2023 1053   CALCIUM 9.5 02/25/2023 1053   PROT 7.2 02/25/2023 1053   ALBUMIN 4.3 02/25/2023 1053   AST 31 02/25/2023 1053   ALT 35 02/25/2023 1053   ALKPHOS 148 (H) 02/25/2023 1053   BILITOT 0.5 02/25/2023 1053   GFRNONAA >60 02/25/2023 1053   Lab Results  Component Value Date   WBC 2.5 (L) 03/18/2023   NEUTROABS 1.0 (L) 03/18/2023   HGB 13.9 03/18/2023   HCT 39.7 03/18/2023   MCV 89.8 03/18/2023   PLT 186 03/18/2023   Imaging:  [facility] Clinician Interpretation: I have personally reviewed the CNS images as listed.  My interpretation, in the context of the patient's clinical presentation, is progressive disease pending official read  MR BRAIN W WO CONTRAST  Result Date: 02/27/2023 CLINICAL DATA:  Brain/CNS neoplasm, assess treatment response EXAM: MRI HEAD WITHOUT AND WITH CONTRAST TECHNIQUE: Multiplanar, multiecho pulse sequences of the brain and surrounding structures were obtained without and with intravenous contrast. CONTRAST:  7mL GADAVIST GADOBUTROL 1 MMOL/ML IV SOLN COMPARISON:  Brain MR 12/26/22, Brain Mr 10/27/22 FINDINGS: Brain: Negative for an acute infarct. No hydrocephalus. No extra-axial fluid collection. Postsurgical changes from a left pterional craniotomy with a subjacent resection cavity redemonstrated. The size of the cystic resection cavity has slightly increased from prior exam now measuring up to 3.5 x 2.9 cm, previously 3.4 x 2.4 cm (series 12, image 11). The degree of T2/FLAIR hyperintense signal abnormality surrounding the resection cavity appears grossly unchanged. The T2 hyperintense lesion within the internal capsule on the left also appears unchanged measuring up to 9 mm. In regard to contrast enhancement there is slight interval increase in size of the focal  nodular enhancement along the lateral aspect of the resection cavity (series 24, image 15), which now measures up to 1.6 x 1.0 cm, previously 1.3 x 0.8 cm, when measured in a similar orientation. Nodular contrast enhancement surrounding the resection cavity otherwise unchanged from prior exam. Enhancement associated with lesion in the left internal capsule has not resolved. There is persistent intrinsic T1 hyperintense material around the resection cavity and along the hippocampal region, possibly chronic blood products. Vascular: Normal flow voids. Skull and upper cervical spine: Normal marrow signal. Sinuses/Orbits: No middle ear or mastoid effusion. Mucosal thickening bilateral maxillary and ethmoid sinuses. Orbits are unremarkable. Other: None. IMPRESSION: Overall there mixed findings. 1. Slight interval increase in the  focal nodular enhancement along the lateral aspect of the resection cavity. The degree of T2/FLAIR hyperintense signal abnormality surrounding the resection cavity appears grossly unchanged. 2. Enhancement associated with the lesion in the left internal capsule has resolved. 3. No acute intracranial process. Electronically Signed   By: Lorenza Cambridge M.D.   On: 02/27/2023 18:28    Assessment/Plan Glioblastoma, IDH-wildtype (HCC)  Focal seizures (HCC)  Murry Sabir presents today for scheduled CPT-11 and Avastin.  No clinical changes today.  Will recommend proceeding with cycle #4, day 15 of Irinotecan 125mg /m2 IV q2 weeks, with concurrent Avastin 10mg /kg IV q2 weeks.  We reviewed side effects, including diarrhea.  Avastin has been previously well tolerated.  Imodium will be provided for use at home if needed.  Zarxio will be administered 24h after infusion due to neutropenia.  Schedule will continue q3 weeks due to ctyopenias.  Chemotherapy should be held for the following:  ANC less than 1,000  Platelets less than 100,000  LFT or creatinine greater than 2x ULN  If  clinical concerns/contraindications develop  Avastin should be held for the following:  ANC less than 500  Platelets less than 50,000  LFT or creatinine greater than 2x ULN  If clinical concerns/contraindications develop  Keppra will stay at 750mg  BID for now.  We appreciate the opportunity to participate in the care of [place].     We ask that Chao Wiltgen return in 3 with with MRI brain for evaluation, or sooner as needed.    All questions were answered. The patient knows to call the clinic with any problems, questions or concerns. No barriers to learning were detected.  The total time spent in the encounter was 30 minutes and more than 50% was on counseling and review of test results   Henreitta Leber, MD Medical Director of Neuro-Oncology [facility] at [place] 03/18/23 8:52 AM

## 2023-03-18 NOTE — Progress Notes (Signed)
Patient seen by Dr. Driscilla Grammes are within treatment parameters  Labs reviewed: and are not all within treatment parameters. ANC 1.0  Per physician team, patient is ready for treatment and there are NO modifications to the treatment plan.

## 2023-03-18 NOTE — Telephone Encounter (Signed)
PC to patient, informed him he has brain MRI on 03/31/23 @ 10:00, he is to arrive at 9:30 at [facility].  He verbalizes understanding.

## 2023-03-19 ENCOUNTER — Inpatient Hospital Stay: Payer: Medicare Other

## 2023-03-19 ENCOUNTER — Other Ambulatory Visit: Payer: Self-pay

## 2023-03-19 VITALS — BP 126/75 | HR 66 | Temp 98.4°F | Resp 16

## 2023-03-19 DIAGNOSIS — C719 Malignant neoplasm of brain, unspecified: Secondary | ICD-10-CM

## 2023-03-19 DIAGNOSIS — Z5111 Encounter for antineoplastic chemotherapy: Secondary | ICD-10-CM | POA: Diagnosis not present

## 2023-03-19 MED ORDER — FILGRASTIM-SNDZ 480 MCG/0.8ML IJ SOSY
480.0000 ug | PREFILLED_SYRINGE | Freq: Once | INTRAMUSCULAR | Status: AC
  Administered 2023-03-19: 480 ug via SUBCUTANEOUS
  Filled 2023-03-19: qty 0.8

## 2023-03-20 ENCOUNTER — Ambulatory Visit: Payer: Medicare Other

## 2023-03-21 ENCOUNTER — Other Ambulatory Visit: Payer: Self-pay

## 2023-03-31 ENCOUNTER — Ambulatory Visit (HOSPITAL_COMMUNITY)
Admission: RE | Admit: 2023-03-31 | Discharge: 2023-03-31 | Disposition: A | Discharge status: Home / Self Care | Payer: Medicare Other | Source: Ambulatory Visit | Attending: Internal Medicine | Admitting: Internal Medicine

## 2023-03-31 DIAGNOSIS — C719 Malignant neoplasm of brain, unspecified: Secondary | ICD-10-CM | POA: Insufficient documentation

## 2023-03-31 MED ORDER — GADOBUTROL 1 MMOL/ML IV SOLN
7.0000 mL | Freq: Once | INTRAVENOUS | Status: AC | PRN
  Administered 2023-03-31: 7 mL via INTRAVENOUS

## 2023-04-07 ENCOUNTER — Other Ambulatory Visit: Payer: Self-pay | Admitting: *Deleted

## 2023-04-07 DIAGNOSIS — C719 Malignant neoplasm of brain, unspecified: Secondary | ICD-10-CM

## 2023-04-08 ENCOUNTER — Inpatient Hospital Stay (HOSPITAL_BASED_OUTPATIENT_CLINIC_OR_DEPARTMENT_OTHER): Payer: Medicare Other | Admitting: Internal Medicine

## 2023-04-08 ENCOUNTER — Inpatient Hospital Stay: Payer: Medicare Other

## 2023-04-08 VITALS — BP 118/72 | HR 60 | Temp 97.3°F | Resp 13 | Wt 156.8 lb

## 2023-04-08 DIAGNOSIS — C719 Malignant neoplasm of brain, unspecified: Secondary | ICD-10-CM | POA: Diagnosis not present

## 2023-04-08 DIAGNOSIS — Z5111 Encounter for antineoplastic chemotherapy: Secondary | ICD-10-CM | POA: Diagnosis not present

## 2023-04-08 DIAGNOSIS — R569 Unspecified convulsions: Secondary | ICD-10-CM

## 2023-04-08 LAB — CBC WITH DIFFERENTIAL (CANCER CENTER ONLY)
Abs Immature Granulocytes: 0 10*3/uL (ref 0.00–0.07)
Basophils Absolute: 0 10*3/uL (ref 0.0–0.1)
Basophils Relative: 1 %
Eosinophils Absolute: 0 10*3/uL (ref 0.0–0.5)
Eosinophils Relative: 1 %
HCT: 41.7 % (ref 39.0–52.0)
Hemoglobin: 14.3 g/dL (ref 13.0–17.0)
Immature Granulocytes: 0 %
Lymphocytes Relative: 36 %
Lymphs Abs: 0.8 10*3/uL (ref 0.7–4.0)
MCH: 31 pg (ref 26.0–34.0)
MCHC: 34.3 g/dL (ref 30.0–36.0)
MCV: 90.3 fL (ref 80.0–100.0)
Monocytes Absolute: 0.4 10*3/uL (ref 0.1–1.0)
Monocytes Relative: 19 %
Neutro Abs: 0.9 10*3/uL — ABNORMAL LOW (ref 1.7–7.7)
Neutrophils Relative %: 43 %
Platelet Count: 197 10*3/uL (ref 150–400)
RBC: 4.62 MIL/uL (ref 4.22–5.81)
RDW: 13.3 % (ref 11.5–15.5)
WBC Count: 2.1 10*3/uL — ABNORMAL LOW (ref 4.0–10.5)
nRBC: 0 % (ref 0.0–0.2)

## 2023-04-08 LAB — CMP (CANCER CENTER ONLY)
ALT: 29 U/L (ref 0–44)
AST: 28 U/L (ref 15–41)
Albumin: 4.2 g/dL (ref 3.5–5.0)
Alkaline Phosphatase: 132 U/L — ABNORMAL HIGH (ref 38–126)
Anion gap: 5 (ref 5–15)
BUN: 11 mg/dL (ref 8–23)
CO2: 29 mmol/L (ref 22–32)
Calcium: 9.5 mg/dL (ref 8.9–10.3)
Chloride: 104 mmol/L (ref 98–111)
Creatinine: 0.76 mg/dL (ref 0.61–1.24)
GFR, Estimated: >60 mL/min (ref >60)
Glucose, Bld: 117 mg/dL — ABNORMAL HIGH (ref 70–99)
Potassium: 4.4 mmol/L (ref 3.5–5.1)
Sodium: 138 mmol/L (ref 135–145)
Total Bilirubin: 0.5 mg/dL (ref 0.3–1.2)
Total Protein: 6.8 g/dL (ref 6.5–8.1)

## 2023-04-08 LAB — TOTAL PROTEIN, URINE DIPSTICK: Protein, ur: NEGATIVE mg/dL

## 2023-04-08 NOTE — Progress Notes (Signed)
[facility] [address]  [place], [place] [address] [phone]   Interval Evaluation  Date of Service: 04/08/23 [name] Patient MRN: [id number] Patient DOB: 07-Oct-1960 Provider: Henreitta Leber, MD  Identifying Statement:  Hunter Terry is a 62 y.o. male with left temporal glioblastoma   Oncologic History: Oncology History  Glioblastoma, IDH-wildtype (HCC)  06/15/2020 Surgery   Stereotactic biopsy, L temporal Maisie Fus).  Path demonstrates glioblastoma IDH-1 wt   08/02/2020 Surgery   Debulking craniotomy with Dr. Maisie Fus   09/05/2020 - 10/17/2020 Radiation Therapy   IMRT with concurrent Temozolomide [name])   11/19/2020 - 06/18/2021 Chemotherapy   Completes #7 cycles adjuvant 5-day Temozolomide       07/17/2021 Progression   Progression of disease #1   07/18/2021 - 07/10/2022 Chemotherapy   Completes 8 cycles of CCNU 90mg /m2 PO q6 weeks and Avastin 10mg /kg IV q2 weeks   07/11/2022 Progression   Progression of disease #2   09/05/2022 Surgery   Repeat left temporal craniotomy with Dr. Maisie Fus; path is Glioblastoma IDHwt   09/19/2022 -  Chemotherapy   Initiates metronomic Temozolomide, 50mg /m2 PO daily   09/23/2022 - 09/23/2022 Chemotherapy   Patient is on Treatment Plan : BRAIN Low Grade Glioma Grade II, Glioblastoma,  Astrocytoma, Oligodendroma, Recurrent or Progressive / Temozolomide D1-5 Q28 Days     11/04/2022 -  Chemotherapy   Patient is on Treatment Plan : BRAIN GBM [facility] Irinotecan D1,15 + Bevacizumab D1,15 q 28d       Biomarkers:  MGMT Unknown.  IDH 1/2 Wild type.  EGFR Unknown  TERT "Mutated   Interval History: Hunter Terry presents today for avastin and irinotecan infusion.  Denies new or progressive changes.  No balance issues, motor dysfunction.  Continues on Keppra 750mg  BID, no headaches.  H+P (07/24/20) Patient presented to medical attention in late October, 2021 with new onset seizure.   Event was decribed as loss of consciousness, sudden, without clarity on further details aside from altered mental status upon awakening.  CNS imaging demonsrated non-enhancing mass within left temporal lobe c/w likely glioma; he underwent stereotactic biopsy with Dr. Maisie Fus on 06/15/20.  There was significant delay in finalizing path, explaining delay in follow up and evaluation.  He denies any seizures since discharge from hospital, on 4 anti-seizure drugs.  He does complain of dizziness and drowsiness with the medications, however.  Also describes impaired short term memory.  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    Medications: Current Outpatient Medications on File Prior to Visit  Medication Sig Dispense Refill   acetaminophen (TYLENOL) 500 MG tablet Take 500 mg by mouth every 6 (six) hours as needed for moderate pain.     levETIRAcetam (KEPPRA) 750 MG tablet Take 1 tablet (750 mg total) by mouth 2 (two) times daily. 60 tablet 3   loperamide (IMODIUM) 2 MG capsule Take 2 tabs by mouth with first loose stool, then 1 tab with each additional loose stool as needed. Do not exceed 8 tabs in a 24-hour period 30 capsule 1   Multiple Vitamin (MULTIVITAMIN WITH MINERALS) TABS tablet Take 1 tablet by mouth daily.     ondansetron (ZOFRAN) 8 MG tablet Take 1 tablet (8 mg total) by mouth every 8 (eight) hours as needed for nausea, vomiting or refractory nausea / vomiting. Start on the third day after chemotherapy. 30 tablet 1   [DISCONTINUED] lacosamide (VIMPAT) 200 MG TABS tablet Take 0.5 tablets (100 mg total)  by mouth 2 (two) times daily. 30 tablet 0   No current facility-administered medications on file prior to visit.    Allergies: No Known Allergies Past Medical History:  Past Medical History:  Diagnosis Date   Cancer (HCC)    BRAIN TUMOR   Headache    Seizure [facility])    Past Surgical History:  Past Surgical History:  Procedure Laterality Date   APPLICATION OF CRANIAL NAVIGATION N/A  06/15/2020   Procedure: APPLICATION OF CRANIAL NAVIGATION;  Surgeon: Bedelia Person, MD;  Location: [facility] OR;  Service: Neurosurgery;  Laterality: N/A;   APPLICATION OF CRANIAL NAVIGATION N/A 08/02/2020   Procedure: APPLICATION OF CRANIAL NAVIGATION;  Surgeon: Bedelia Person, MD;  Location: [facility] OR;  Service: Neurosurgery;  Laterality: N/A;   APPLICATION OF CRANIAL NAVIGATION Left 09/05/2022   Procedure: APPLICATION OF CRANIAL NAVIGATION;  Surgeon: Bedelia Person, MD;  Location: [facility] OR;  Service: Neurosurgery;  Laterality: Left;   CRANIOTOMY N/A 08/02/2020   Procedure: CRANIOTOMY LEFT TEMPORAL LOBECTOMY FOR TUMOR EXCISION;  Surgeon: Bedelia Person, MD;  Location: [facility] OR;  Service: Neurosurgery;  Laterality: N/A;   CRANIOTOMY Left 09/05/2022   Procedure: CRANIOTOMY FOR RESECTION OF TEMPORAL LOBE TUMOR;  Surgeon: Bedelia Person, MD;  Location: [facility] OR;  Service: Neurosurgery;  Laterality: Left;   FRAMELESS  BIOPSY WITH BRAINLAB Left 06/15/2020   Procedure: Left temporal lobe stereotactic brain biopsy with brainlab;  Surgeon: Bedelia Person, MD;  Location: [facility] OR;  Service: Neurosurgery;  Laterality: Left;   Social History:  Social History   Socioeconomic History   Marital status: Married    Spouse name: Not on file   Number of children: Not on file   Years of education: Not on file   Highest education level: Not on file  Occupational History   Not on file  Tobacco Use   Smoking status: Never   Smokeless tobacco: Never  Vaping Use   Vaping status: Never Used  Substance and Sexual Activity   Alcohol use: Yes    Comment: 1 beer a month   Drug use: Never   Sexual activity: Yes    Partners: Female  Other Topics Concern   Not on file  Social History Narrative   Not on file   Social Determinants of Health   Financial Resource Strain: Not on file  Food Insecurity: Not on file  Transportation Needs: Not on file  Physical Activity: Not on file  Stress: Not on file  Social  Connections: Unknown (12/24/2021)   Received from [facility]   Social Network    Social Network: Not on file  Intimate Partner Violence: Unknown (11/13/2021)   Received from [facility]   HITS    Physically Hurt: Not on file    Insult or Talk Down To: Not on file    Threaten Physical Harm: Not on file    Scream or Curse: Not on file   Family History:  Family History  Problem Relation Age of Onset   Cancer Neg Hx     Review of Systems: Constitutional: Doesn't report fevers, chills or abnormal weight loss Eyes: Doesn't report blurriness of vision Ears, nose, mouth, throat, and face: Doesn't report sore throat Respiratory: Doesn't report cough, dyspnea or wheezes Cardiovascular: Doesn't report palpitation, chest discomfort  Gastrointestinal:  Doesn't report nausea, constipation, diarrhea GU: Doesn't report incontinence Skin: Doesn't report skin rashes Neurological: Per HPI Musculoskeletal: Doesn't report joint pain Behavioral/Psych: Doesn't report anxiety  Physical Exam: Vitals:  04/08/23 1202  BP: 118/72  Pulse: 60  Resp: 13  Temp: (!) 97.3 F (36.3 C)  SpO2: 100%   KPS: 80. General: Alert, cooperative, pleasant, in no acute distress Head: Normal EENT: No conjunctival injection or scleral icterus.  Lungs: Resp effort normal Cardiac: Regular rate Abdomen: Non-distended abdomen Skin: No rashes cyanosis or petechiae. Extremities: No clubbing or edema  Neurologic Exam: Mental Status: Awake, alert, attentive to examiner. Oriented to self and environment. Language has mildly impaired fluency with intact comprehension.  Cranial Nerves: Visual acuity is grossly normal. Visual fields are full. Extra-ocular movements intact. No ptosis. Face is symmetric Motor: Tone and bulk are normal. Power is full in both arms and legs. Reflexes are symmetric, no pathologic reflexes present.  Sensory: Intact to light touch Gait: Normal.   Labs: I have reviewed the data as listed     Component Value Date/Time   NA 138 04/08/2023 1134   K 4.4 04/08/2023 1134   CL 104 04/08/2023 1134   CO2 29 04/08/2023 1134   GLUCOSE 117 (H) 04/08/2023 1134   BUN 11 04/08/2023 1134   CREATININE 0.76 04/08/2023 1134   CALCIUM 9.5 04/08/2023 1134   PROT 6.8 04/08/2023 1134   ALBUMIN 4.2 04/08/2023 1134   AST 28 04/08/2023 1134   ALT 29 04/08/2023 1134   ALKPHOS 132 (H) 04/08/2023 1134   BILITOT 0.5 04/08/2023 1134   GFRNONAA >60 04/08/2023 1134   Lab Results  Component Value Date   WBC 2.1 (L) 04/08/2023   NEUTROABS 0.9 (L) 04/08/2023   HGB 14.3 04/08/2023   HCT 41.7 04/08/2023   MCV 90.3 04/08/2023   PLT 197 04/08/2023   Imaging:  [facility] Clinician Interpretation: I have personally reviewed the CNS images as listed.  My interpretation, in the context of the patient's clinical presentation, is stable disease pending official read  No results found.  Assessment/Plan Glioblastoma, IDH-wildtype (HCC) - Plan: Infusion Appointment Request, Clinic Appointment Request, Lab Appointment Request, CBC with Differential (Cancer Center Only), CMP (Cancer Center only), Infusion Appointment Request, Clinic Appointment Request, Lab Appointment Request, CBC with Differential (Cancer Center Only), CMP (Cancer Center only)  Focal seizures (HCC)  Hunter Terry presents today for scheduled CPT-11 and Avastin.  MRI brain actually demonstrates stable findings when compared to prior study in July, which had shown some progression of an enhancing nodule.    Unfortunately labs continue to demonstrate neutropenia.  Recommended holding off on chemo today and rechecking labs again in 2 weeks.  Once normalized, will recommend proceeding with cycle #5, day 1 of Irinotecan 125mg /m2 IV q2 weeks, with concurrent Avastin 10mg /kg IV q2 weeks.  We reviewed side effects, including diarrhea.  Avastin has been previously well tolerated.  Imodium will be provided for use at home if needed.  Zarxio will be  administered 24h after infusion due to neutropenia.  Schedule will continue q3 weeks due to ctyopenias.  Chemotherapy should be held for the following:  ANC less than 1,000  Platelets less than 100,000  LFT or creatinine greater than 2x ULN  If clinical concerns/contraindications develop  Avastin should be held for the following:  ANC less than 500  Platelets less than 50,000  LFT or creatinine greater than 2x ULN  If clinical concerns/contraindications develop  Keppra will stay at 750mg  BID for now.  We appreciate the opportunity to participate in the care of Hunter Terry.     We ask that Hunter Terry return in 2 weeks with labs for evaluation, or  sooner as needed.    All questions were answered. The patient knows to call the clinic with any problems, questions or concerns. No barriers to learning were detected.  The total time spent in the encounter was 40 minutes and more than 50% was on counseling and review of test results   Henreitta Leber, MD Medical Director of Neuro-Oncology [facility] at [place] 04/08/23 12:24 PM

## 2023-04-09 ENCOUNTER — Other Ambulatory Visit: Payer: Self-pay

## 2023-04-18 MED FILL — Dexamethasone Sodium Phosphate Inj 100 MG/10ML: INTRAMUSCULAR | Qty: 1 | Status: AC

## 2023-04-21 ENCOUNTER — Inpatient Hospital Stay: Payer: Medicare Other | Attending: Internal Medicine

## 2023-04-21 ENCOUNTER — Inpatient Hospital Stay (HOSPITAL_BASED_OUTPATIENT_CLINIC_OR_DEPARTMENT_OTHER): Payer: Medicare Other | Admitting: Internal Medicine

## 2023-04-21 ENCOUNTER — Other Ambulatory Visit: Payer: Self-pay

## 2023-04-21 VITALS — BP 143/84 | HR 81 | Temp 98.1°F | Resp 17 | Wt 155.7 lb

## 2023-04-21 DIAGNOSIS — Z5111 Encounter for antineoplastic chemotherapy: Secondary | ICD-10-CM | POA: Insufficient documentation

## 2023-04-21 DIAGNOSIS — C719 Malignant neoplasm of brain, unspecified: Secondary | ICD-10-CM

## 2023-04-21 DIAGNOSIS — C712 Malignant neoplasm of temporal lobe: Secondary | ICD-10-CM | POA: Diagnosis not present

## 2023-04-21 DIAGNOSIS — Z79899 Other long term (current) drug therapy: Secondary | ICD-10-CM | POA: Insufficient documentation

## 2023-04-21 DIAGNOSIS — R569 Unspecified convulsions: Secondary | ICD-10-CM | POA: Diagnosis not present

## 2023-04-21 DIAGNOSIS — Z923 Personal history of irradiation: Secondary | ICD-10-CM | POA: Diagnosis not present

## 2023-04-21 LAB — CMP (CANCER CENTER ONLY)
ALT: 43 U/L (ref 0–44)
AST: 37 U/L (ref 15–41)
Albumin: 4.6 g/dL (ref 3.5–5.0)
Alkaline Phosphatase: 166 U/L — ABNORMAL HIGH (ref 38–126)
Anion gap: 8 (ref 5–15)
BUN: 14 mg/dL (ref 8–23)
CO2: 28 mmol/L (ref 22–32)
Calcium: 9.9 mg/dL (ref 8.9–10.3)
Chloride: 102 mmol/L (ref 98–111)
Creatinine: 0.85 mg/dL (ref 0.61–1.24)
GFR, Estimated: >60 mL/min (ref >60)
Glucose, Bld: 113 mg/dL — ABNORMAL HIGH (ref 70–99)
Potassium: 3.8 mmol/L (ref 3.5–5.1)
Sodium: 138 mmol/L (ref 135–145)
Total Bilirubin: 0.6 mg/dL (ref 0.3–1.2)
Total Protein: 7.6 g/dL (ref 6.5–8.1)

## 2023-04-21 LAB — CBC WITH DIFFERENTIAL (CANCER CENTER ONLY)
Abs Immature Granulocytes: 0.01 10*3/uL (ref 0.00–0.07)
Basophils Absolute: 0 10*3/uL (ref 0.0–0.1)
Basophils Relative: 1 %
Eosinophils Absolute: 0 10*3/uL (ref 0.0–0.5)
Eosinophils Relative: 1 %
HCT: 43.9 % (ref 39.0–52.0)
Hemoglobin: 14.9 g/dL (ref 13.0–17.0)
Immature Granulocytes: 0 %
Lymphocytes Relative: 21 %
Lymphs Abs: 0.7 10*3/uL (ref 0.7–4.0)
MCH: 30.5 pg (ref 26.0–34.0)
MCHC: 33.9 g/dL (ref 30.0–36.0)
MCV: 90 fL (ref 80.0–100.0)
Monocytes Absolute: 0.4 10*3/uL (ref 0.1–1.0)
Monocytes Relative: 10 %
Neutro Abs: 2.2 10*3/uL (ref 1.7–7.7)
Neutrophils Relative %: 67 %
Platelet Count: 166 10*3/uL (ref 150–400)
RBC: 4.88 MIL/uL (ref 4.22–5.81)
RDW: 13.3 % (ref 11.5–15.5)
WBC Count: 3.4 10*3/uL — ABNORMAL LOW (ref 4.0–10.5)
nRBC: 0 % (ref 0.0–0.2)

## 2023-04-21 LAB — TOTAL PROTEIN, URINE DIPSTICK: Protein, ur: 30 mg/dL — AB

## 2023-04-21 MED ORDER — SODIUM CHLORIDE 0.9 % IV SOLN
10.0000 mg | Freq: Once | INTRAVENOUS | Status: AC
  Administered 2023-04-21: 10 mg via INTRAVENOUS
  Filled 2023-04-21: qty 10

## 2023-04-21 MED ORDER — SODIUM CHLORIDE 0.9 % IV SOLN
125.0000 mg/m2 | Freq: Once | INTRAVENOUS | Status: AC
  Administered 2023-04-21: 220 mg via INTRAVENOUS
  Filled 2023-04-21: qty 11

## 2023-04-21 MED ORDER — SODIUM CHLORIDE 0.9% FLUSH: 10.0000 mL | INTRAVENOUS | Status: DC | PRN

## 2023-04-21 MED ORDER — SODIUM CHLORIDE 0.9 % IV SOLN
10.0000 mg/kg | Freq: Once | INTRAVENOUS | Status: AC
  Administered 2023-04-21: 700 mg via INTRAVENOUS
  Filled 2023-04-21: qty 12

## 2023-04-21 MED ORDER — PALONOSETRON HCL INJECTION 0.25 MG/5ML
0.2500 mg | Freq: Once | INTRAVENOUS | Status: AC
  Administered 2023-04-21: 0.25 mg via INTRAVENOUS
  Filled 2023-04-21: qty 5

## 2023-04-21 MED ORDER — ATROPINE SULFATE 1 MG/ML IV SOLN
0.5000 mg | Freq: Once | INTRAVENOUS | Status: AC | PRN
  Administered 2023-04-21: 0.5 mg via INTRAVENOUS
  Filled 2023-04-21: qty 1

## 2023-04-21 MED ORDER — HEPARIN SOD (PORK) LOCK FLUSH 100 UNIT/ML IV SOLN: 500.0000 [IU] | Freq: Once | INTRAVENOUS | Status: DC | PRN

## 2023-04-21 MED ORDER — SODIUM CHLORIDE 0.9 % IV SOLN: Freq: Once | INTRAVENOUS | Status: AC

## 2023-04-21 NOTE — Patient Instructions (Signed)
Instrucciones al darle de alta: Discharge Instructions Gracias por elegir al [facility] de Cncer de [place] para brindarle atencin mdica de oncologa y Teacher, English as a foreign language.   Si usted tiene una cita de laboratorio con [employer] de [place], por favor vaya directamente al [employer] de Cncer y regstrese en el rea de Engineer, maintenance (IT).   Use ropa cmoda y [place] para tener fcil acceso a las vas del Portacath (acceso venoso de Set designer duracin) o la lnea PICC (catter central colocado por va perifrica).   Nos esforzamos por ofrecerle tiempo de calidad con su proveedor. Es posible que tenga que volver a programar su cita si llega tarde (15 minutos o ms).  El llegar tarde le afecta a usted y a otros pacientes cuyas citas son posteriores a Armed forces operational officer.  Adems, si usted falta a tres o ms citas sin avisar a la oficina, puede ser retirado(a) de la clnica a discrecin del proveedor.      Para las solicitudes de renovacin de recetas, pida a su farmacia que se ponga en contacto con nuestra oficina y deje que transcurran 72 horas para que se complete el proceso de las renovaciones.    Hoy usted recibi los siguientes agentes de quimioterapia e/o inmunoterapia irinotecan      Para ayudar a prevenir las nuseas y los vmitos despus de su tratamiento, le recomendamos que tome su medicamento para las nuseas segn las indicaciones.  LOS SNTOMAS QUE DEBEN COMUNICARSE INMEDIATAMENTE SE INDICAN A CONTINUACIN: *FIEBRE SUPERIOR A 100.4 F (38 C) O MS *ESCALOFROS O SUDORACIN *NUSEAS Y VMITOS QUE NO SE CONTROLAN CON EL MEDICAMENTO PARA LAS NUSEAS *DIFICULTAD INUSUAL PARA RESPIRAR  *MORETONES O HEMORRAGIAS NO HABITUALES *PROBLEMAS URINARIOS (dolor o ardor al Geographical information systems officer o frecuencia para Geographical information systems officer) *PROBLEMAS INTESTINALES (diarrea inusual, estreimiento, dolor cerca del ano) SENSIBILIDAD EN LA BOCA Y EN LA GARGANTA CON O SIN LA PRESENCIA DE LCERAS (dolor de garganta, llagas en la boca o dolor de muelas/dientes) ERUPCIN,  HINCHAZN O DOLORES INUSUALES FLUJO VAGINAL INUSUAL O PICAZN/RASQUIA    Los puntos marcados con un asterisco ( *) indican una posible emergencia y debe hacer un seguimiento tan pronto como le sea posible o vaya al Departamento de Emergencias si se le presenta algn problema.  Por favor, muestre la [place] DE ADVERTENCIA DE Marc Morgans DE ADVERTENCIA DE Gardiner Fanti al registrarse en [address] de Emergencias y a la enfermera de triaje.  Si tiene preguntas despus de su visita o necesita cancelar o volver a programar su cita, por favor pngase en contacto con [facility]  Dept: [phone] y siga las instrucciones. Las horas de oficina son de 8:00 a.m. a 4:30 p.m. de lunes a viernes. Por favor, tenga en cuenta que los mensajes de voz que se dejan despus de las 4:00 p.m. posiblemente no se devolvern hasta el siguiente da de [place].  Cerramos los fines de semana y [employer]. En todo momento tiene acceso a una enfermera para preguntas urgentes. Por favor, llame al nmero principal de la clnica Dept: [phone] y siga las instrucciones.   Para cualquier pregunta que no sea de carcter urgente, tambin puede ponerse en contacto con su proveedor [employer]. Ahora ofrecemos visitas electrnicas para cualquier persona mayor de 18 aos que solicite atencin mdica en lnea para los sntomas que no sean urgentes. Para ms detalles vaya a [email or web address].   Tambin puede bajar la aplicacin de MyChart! Vaya a la tienda de aplicaciones, busque "MyChart", abra la  aplicacin, seleccione [place], e ingrese con su nombre de usuario y la contrasea de Clinical cytogeneticist.
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
## 2023-05-12 MED FILL — Dexamethasone Sodium Phosphate Inj 100 MG/10ML: INTRAMUSCULAR | Qty: 1 | Status: AC

## 2023-05-13 ENCOUNTER — Inpatient Hospital Stay (HOSPITAL_BASED_OUTPATIENT_CLINIC_OR_DEPARTMENT_OTHER): Payer: Medicare Other | Admitting: Internal Medicine

## 2023-05-13 ENCOUNTER — Inpatient Hospital Stay: Payer: Medicare Other | Attending: Internal Medicine

## 2023-05-13 ENCOUNTER — Inpatient Hospital Stay: Payer: Medicare Other

## 2023-05-13 VITALS — BP 127/72 | HR 73 | Temp 97.9°F | Resp 20 | Wt 155.0 lb

## 2023-05-13 DIAGNOSIS — Z5111 Encounter for antineoplastic chemotherapy: Secondary | ICD-10-CM | POA: Insufficient documentation

## 2023-05-13 DIAGNOSIS — Z9221 Personal history of antineoplastic chemotherapy: Secondary | ICD-10-CM | POA: Insufficient documentation

## 2023-05-13 DIAGNOSIS — C719 Malignant neoplasm of brain, unspecified: Secondary | ICD-10-CM

## 2023-05-13 DIAGNOSIS — R42 Dizziness and giddiness: Secondary | ICD-10-CM | POA: Diagnosis not present

## 2023-05-13 DIAGNOSIS — R569 Unspecified convulsions: Secondary | ICD-10-CM

## 2023-05-13 DIAGNOSIS — C712 Malignant neoplasm of temporal lobe: Secondary | ICD-10-CM | POA: Diagnosis not present

## 2023-05-13 DIAGNOSIS — Z79899 Other long term (current) drug therapy: Secondary | ICD-10-CM | POA: Diagnosis not present

## 2023-05-13 DIAGNOSIS — R400 Somnolence: Secondary | ICD-10-CM | POA: Diagnosis not present

## 2023-05-13 DIAGNOSIS — Z923 Personal history of irradiation: Secondary | ICD-10-CM | POA: Diagnosis not present

## 2023-05-13 LAB — CMP (CANCER CENTER ONLY)
ALT: 29 U/L (ref 0–44)
AST: 25 U/L (ref 15–41)
Albumin: 4.4 g/dL (ref 3.5–5.0)
Alkaline Phosphatase: 110 U/L (ref 38–126)
Anion gap: 7 (ref 5–15)
BUN: 11 mg/dL (ref 8–23)
CO2: 29 mmol/L (ref 22–32)
Calcium: 9.9 mg/dL (ref 8.9–10.3)
Chloride: 103 mmol/L (ref 98–111)
Creatinine: 0.83 mg/dL (ref 0.61–1.24)
GFR, Estimated: >60 mL/min (ref >60)
Glucose, Bld: 122 mg/dL — ABNORMAL HIGH (ref 70–99)
Potassium: 3.8 mmol/L (ref 3.5–5.1)
Sodium: 139 mmol/L (ref 135–145)
Total Bilirubin: 0.6 mg/dL (ref 0.3–1.2)
Total Protein: 7 g/dL (ref 6.5–8.1)

## 2023-05-13 LAB — CBC WITH DIFFERENTIAL (CANCER CENTER ONLY)
Abs Immature Granulocytes: 0.01 10*3/uL (ref 0.00–0.07)
Basophils Absolute: 0 10*3/uL (ref 0.0–0.1)
Basophils Relative: 1 %
Eosinophils Absolute: 0.1 10*3/uL (ref 0.0–0.5)
Eosinophils Relative: 2 %
HCT: 42.1 % (ref 39.0–52.0)
Hemoglobin: 14.6 g/dL (ref 13.0–17.0)
Immature Granulocytes: 0 %
Lymphocytes Relative: 38 %
Lymphs Abs: 1.1 10*3/uL (ref 0.7–4.0)
MCH: 31.1 pg (ref 26.0–34.0)
MCHC: 34.7 g/dL (ref 30.0–36.0)
MCV: 89.8 fL (ref 80.0–100.0)
Monocytes Absolute: 0.4 10*3/uL (ref 0.1–1.0)
Monocytes Relative: 15 %
Neutro Abs: 1.2 10*3/uL — ABNORMAL LOW (ref 1.7–7.7)
Neutrophils Relative %: 44 %
Platelet Count: 196 10*3/uL (ref 150–400)
RBC: 4.69 MIL/uL (ref 4.22–5.81)
RDW: 13.5 % (ref 11.5–15.5)
WBC Count: 2.7 10*3/uL — ABNORMAL LOW (ref 4.0–10.5)
nRBC: 0 % (ref 0.0–0.2)

## 2023-05-13 LAB — TOTAL PROTEIN, URINE DIPSTICK: Protein, ur: NEGATIVE mg/dL

## 2023-05-13 MED ORDER — SODIUM CHLORIDE 0.9 % IV SOLN: Freq: Once | INTRAVENOUS | Status: AC

## 2023-05-13 MED ORDER — IRINOTECAN HCL CHEMO INJECTION 100 MG/5ML
125.0000 mg/m2 | Freq: Once | INTRAVENOUS | Status: AC
  Administered 2023-05-13: 220 mg via INTRAVENOUS
  Filled 2023-05-13: qty 11

## 2023-05-13 MED ORDER — SODIUM CHLORIDE 0.9 % IV SOLN
10.0000 mg | Freq: Once | INTRAVENOUS | Status: AC
  Administered 2023-05-13: 10 mg via INTRAVENOUS
  Filled 2023-05-13: qty 10

## 2023-05-13 MED ORDER — PALONOSETRON HCL INJECTION 0.25 MG/5ML
0.2500 mg | Freq: Once | INTRAVENOUS | Status: AC
  Administered 2023-05-13: 0.25 mg via INTRAVENOUS
  Filled 2023-05-13: qty 5

## 2023-05-13 MED ORDER — SODIUM CHLORIDE 0.9 % IV SOLN
10.0000 mg/kg | Freq: Once | INTRAVENOUS | Status: AC
  Administered 2023-05-13: 700 mg via INTRAVENOUS
  Filled 2023-05-13: qty 12

## 2023-05-13 MED ORDER — ATROPINE SULFATE 1 MG/ML IV SOLN
0.5000 mg | Freq: Once | INTRAVENOUS | Status: AC | PRN
  Administered 2023-05-13: 0.5 mg via INTRAVENOUS
  Filled 2023-05-13: qty 1

## 2023-05-13 NOTE — Patient Instructions (Signed)
Instrucciones al darle de alta: Discharge Instructions Gracias por elegir al [facility] de Cncer de [place] para brindarle atencin mdica de oncologa y Teacher, English as a foreign language.   Si usted tiene una cita de laboratorio con [employer] de [place], por favor vaya directamente al [employer] de Cncer y regstrese en el rea de Engineer, maintenance (IT).   Use ropa cmoda y [place] para tener fcil acceso a las vas del Portacath (acceso venoso de Set designer duracin) o la lnea PICC (catter central colocado por va perifrica).   Nos esforzamos por ofrecerle tiempo de calidad con su proveedor. Es posible que tenga que volver a programar su cita si llega tarde (15 minutos o ms).  El llegar tarde le afecta a usted y a otros pacientes cuyas citas son posteriores a Armed forces operational officer.  Adems, si usted falta a tres o ms citas sin avisar a la oficina, puede ser retirado(a) de la clnica a discrecin del proveedor.      Para las solicitudes de renovacin de recetas, pida a su farmacia que se ponga en contacto con nuestra oficina y deje que transcurran 72 horas para que se complete el proceso de las renovaciones.    Hoy usted recibi los siguientes agentes de quimioterapia e/o inmunoterapia: bevacizumab and Irinotecan      Para ayudar a prevenir las nuseas y los vmitos despus de su tratamiento, le recomendamos que tome su medicamento para las nuseas segn las indicaciones.  LOS SNTOMAS QUE DEBEN COMUNICARSE INMEDIATAMENTE SE INDICAN A CONTINUACIN: *FIEBRE SUPERIOR A 100.4 F (38 C) O MS *ESCALOFROS O SUDORACIN *NUSEAS Y VMITOS QUE NO SE CONTROLAN CON EL MEDICAMENTO PARA LAS NUSEAS *DIFICULTAD INUSUAL PARA RESPIRAR  *MORETONES O HEMORRAGIAS NO HABITUALES *PROBLEMAS URINARIOS (dolor o ardor al Geographical information systems officer o frecuencia para Geographical information systems officer) *PROBLEMAS INTESTINALES (diarrea inusual, estreimiento, dolor cerca del ano) SENSIBILIDAD EN LA BOCA Y EN LA GARGANTA CON O SIN LA PRESENCIA DE LCERAS (dolor de garganta, llagas en la boca o dolor de  muelas/dientes) ERUPCIN, HINCHAZN O DOLORES INUSUALES FLUJO VAGINAL INUSUAL O PICAZN/RASQUIA    Los puntos marcados con un asterisco ( *) indican una posible emergencia y debe hacer un seguimiento tan pronto como le sea posible o vaya al Departamento de Emergencias si se le presenta algn problema.  Por favor, muestre la [place] DE ADVERTENCIA DE Marc Morgans DE ADVERTENCIA DE Gardiner Fanti al registrarse en [address] de Emergencias y a la enfermera de triaje.  Si tiene preguntas despus de su visita o necesita cancelar o volver a programar su cita, por favor pngase en contacto con [facility]  Dept: [phone] y siga las instrucciones. Las horas de oficina son de 8:00 a.m. a 4:30 p.m. de lunes a viernes. Por favor, tenga en cuenta que los mensajes de voz que se dejan despus de las 4:00 p.m. posiblemente no se devolvern hasta el siguiente da de [place].  Cerramos los fines de semana y [employer]. En todo momento tiene acceso a una enfermera para preguntas urgentes. Por favor, llame al nmero principal de la clnica Dept: [phone] y siga las instrucciones.   Para cualquier pregunta que no sea de carcter urgente, tambin puede ponerse en contacto con su proveedor [employer]. Ahora ofrecemos visitas electrnicas para cualquier persona mayor de 18 aos que solicite atencin mdica en lnea para los sntomas que no sean urgentes. Para ms detalles vaya a [email or web address].   Tambin puede bajar la aplicacin de MyChart! Vaya a la tienda de aplicaciones, busque "MyChart",  abra la aplicacin, seleccione [place], e ingrese con su nombre de usuario y la contrasea de Clinical cytogeneticist.

## 2023-05-13 NOTE — Progress Notes (Signed)
Patient seen by Dr. Driscilla Grammes are within treatment parameters.  Labs reviewed: Labs are not all within parameters, ANC 1.2  Per physician team, patient is ready for treatment and there are NO modifications to the treatment plan.

## 2023-05-13 NOTE — Progress Notes (Signed)
[facility] at [facility] [address]  [place], [place] [address] [phone]   Interval Evaluation  Date of Service: 05/13/23 [name] Patient MRN: [id number] Patient DOB: [birth year] Provider: Henreitta Leber, MD  Identifying Statement:  Hunter Terry is a 62 y.o. male with left temporal glioblastoma   Oncologic History: Oncology History  Glioblastoma, IDH-wildtype (HCC)  06/15/2020 Surgery   Stereotactic biopsy, L temporal Hunter Fus).  Path demonstrates glioblastoma IDH-1 wt   08/02/2020 Surgery   Debulking craniotomy with Dr. Maisie Fus   09/05/2020 - 10/17/2020 Radiation Therapy   IMRT with concurrent Temozolomide [name])   11/19/2020 - 06/18/2021 Chemotherapy   Completes #7 cycles adjuvant 5-day Temozolomide       07/17/2021 Progression   Progression of disease #1   07/18/2021 - 07/10/2022 Chemotherapy   Completes 8 cycles of CCNU 90mg /m2 PO q6 weeks and Avastin 10mg /kg IV q2 weeks   07/11/2022 Progression   Progression of disease #2   09/05/2022 Surgery   Repeat left temporal craniotomy with Dr. Maisie Fus; path is Glioblastoma IDHwt   09/19/2022 -  Chemotherapy   Initiates metronomic Temozolomide, 50mg /m2 PO daily   09/23/2022 - 09/23/2022 Chemotherapy   Patient is on Treatment Plan : BRAIN Low Grade Glioma Grade II, Glioblastoma,  Astrocytoma, Oligodendroma, Recurrent or Progressive / Temozolomide D1-5 Q28 Days     11/04/2022 -  Chemotherapy   Patient is on Treatment Plan : BRAIN GBM [facility] Irinotecan D1,15 + Bevacizumab D1,15 q 28d       Biomarkers:  MGMT Unknown.  IDH 1/2 Wild type.  EGFR Unknown  TERT "Mutated   Interval History: Hunter Terry presents today for avastin and irinotecan infusion.  No clinical changes today.  No balance issues, motor dysfunction.  Continues on Keppra 750mg  BID, no headaches.  H+P (07/24/20) Patient presented to medical attention in late October, 2021 with new onset seizure.  Event was  decribed as loss of consciousness, sudden, without clarity on further details aside from altered mental status upon awakening.  CNS imaging demonsrated non-enhancing mass within left temporal lobe c/w likely glioma; he underwent stereotactic biopsy with Dr. Maisie Fus on 06/15/20.  There was significant delay in finalizing path, explaining delay in follow up and evaluation.  He denies any seizures since discharge from hospital, on 4 anti-seizure drugs.  He does complain of dizziness and drowsiness with the medications, however.  Also describes impaired short term memory.  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    Medications: Current Outpatient Medications on File Prior to Visit  Medication Sig Dispense Refill   acetaminophen (TYLENOL) 500 MG tablet Take 500 mg by mouth every 6 (six) hours as needed for moderate pain.     levETIRAcetam (KEPPRA) 750 MG tablet Take 1 tablet (750 mg total) by mouth 2 (two) times daily. 60 tablet 3   loperamide (IMODIUM) 2 MG capsule Take 2 tabs by mouth with first loose stool, then 1 tab with each additional loose stool as needed. Do not exceed 8 tabs in a 24-hour period 30 capsule 1   Multiple Vitamin (MULTIVITAMIN WITH MINERALS) TABS tablet Take 1 tablet by mouth daily.     ondansetron (ZOFRAN) 8 MG tablet Take 1 tablet (8 mg total) by mouth every 8 (eight) hours as needed for nausea, vomiting or refractory nausea / vomiting. Start on the third day after chemotherapy. 30 tablet 1   [DISCONTINUED] lacosamide (VIMPAT) 200 MG TABS tablet Take 0.5 tablets (100 mg total) by  mouth 2 (two) times daily. 30 tablet 0   No current facility-administered medications on file prior to visit.    Allergies: No Known Allergies Past Medical History:  Past Medical History:  Diagnosis Date   Cancer (HCC)    BRAIN TUMOR   Headache    Seizure [facility])    Past Surgical History:  Past Surgical History:  Procedure Laterality Date   APPLICATION OF CRANIAL NAVIGATION N/A 06/15/2020    Procedure: APPLICATION OF CRANIAL NAVIGATION;  Surgeon: Bedelia Person, MD;  Location: [facility] OR;  Service: Neurosurgery;  Laterality: N/A;   APPLICATION OF CRANIAL NAVIGATION N/A 08/02/2020   Procedure: APPLICATION OF CRANIAL NAVIGATION;  Surgeon: Bedelia Person, MD;  Location: [facility] OR;  Service: Neurosurgery;  Laterality: N/A;   APPLICATION OF CRANIAL NAVIGATION Left 09/05/2022   Procedure: APPLICATION OF CRANIAL NAVIGATION;  Surgeon: Bedelia Person, MD;  Location: [facility] OR;  Service: Neurosurgery;  Laterality: Left;   CRANIOTOMY N/A 08/02/2020   Procedure: CRANIOTOMY LEFT TEMPORAL LOBECTOMY FOR TUMOR EXCISION;  Surgeon: Bedelia Person, MD;  Location: [facility] OR;  Service: Neurosurgery;  Laterality: N/A;   CRANIOTOMY Left 09/05/2022   Procedure: CRANIOTOMY FOR RESECTION OF TEMPORAL LOBE TUMOR;  Surgeon: Bedelia Person, MD;  Location: [facility] OR;  Service: Neurosurgery;  Laterality: Left;   FRAMELESS  BIOPSY WITH BRAINLAB Left 06/15/2020   Procedure: Left temporal lobe stereotactic brain biopsy with brainlab;  Surgeon: Bedelia Person, MD;  Location: [facility] OR;  Service: Neurosurgery;  Laterality: Left;   Social History:  Social History   Socioeconomic History   Marital status: Married    Spouse name: Not on file   Number of children: Not on file   Years of education: Not on file   Highest education level: Not on file  Occupational History   Not on file  Tobacco Use   Smoking status: Never   Smokeless tobacco: Never  Vaping Use   Vaping status: Never Used  Substance and Sexual Activity   Alcohol use: Yes    Comment: 1 beer a month   Drug use: Never   Sexual activity: Yes    Partners: Female  Other Topics Concern   Not on file  Social History Narrative   Not on file   Social Determinants of Health   Financial Resource Strain: Not on file  Food Insecurity: Not on file  Transportation Needs: Not on file  Physical Activity: Not on file  Stress: Not on file  Social  Connections: Unknown (12/24/2021)   Received from [facility], [facility]   Social Network    Social Network: Not on file  Intimate Partner Violence: Unknown (11/13/2021)   Received from [employer], [facility]   HITS    Physically Hurt: Not on file    Insult or Talk Down To: Not on file    Threaten Physical Harm: Not on file    Scream or Curse: Not on file   Family History:  Family History  Problem Relation Age of Onset   Cancer Neg Hx     Review of Systems: Constitutional: Doesn't report fevers, chills or abnormal weight loss Eyes: Doesn't report blurriness of vision Ears, nose, mouth, throat, and face: Doesn't report sore throat Respiratory: Doesn't report cough, dyspnea or wheezes Cardiovascular: Doesn't report palpitation, chest discomfort  Gastrointestinal:  Doesn't report nausea, constipation, diarrhea GU: Doesn't report incontinence Skin: Doesn't report skin rashes Neurological: Per HPI Musculoskeletal: Doesn't report joint pain Behavioral/Psych: Doesn't report anxiety  Physical Exam:  Vitals:   05/13/23 0958  BP: 127/72  Pulse: 73  Resp: 20  Temp: 97.9 F (36.6 C)  SpO2: 100%    KPS: 80. General: Alert, cooperative, pleasant, in no acute distress Head: Normal EENT: No conjunctival injection or scleral icterus.  Lungs: Resp effort normal Cardiac: Regular rate Abdomen: Non-distended abdomen Skin: No rashes cyanosis or petechiae. Extremities: No clubbing or edema  Neurologic Exam: Mental Status: Awake, alert, attentive to examiner. Oriented to self and environment. Language has mildly impaired fluency with intact comprehension.  Cranial Nerves: Visual acuity is grossly normal. Visual fields are full. Extra-ocular movements intact. No ptosis. Face is symmetric Motor: Tone and bulk are normal. Power is full in both arms and legs. Reflexes are symmetric, no pathologic reflexes present.  Sensory: Intact to light touch Gait: Normal.   Labs: I have  reviewed the data as listed    Component Value Date/Time   NA 138 04/21/2023 0948   K 3.8 04/21/2023 0948   CL 102 04/21/2023 0948   CO2 28 04/21/2023 0948   GLUCOSE 113 (H) 04/21/2023 0948   BUN 14 04/21/2023 0948   CREATININE 0.85 04/21/2023 0948   CALCIUM 9.9 04/21/2023 0948   PROT 7.6 04/21/2023 0948   ALBUMIN 4.6 04/21/2023 0948   AST 37 04/21/2023 0948   ALT 43 04/21/2023 0948   ALKPHOS 166 (H) 04/21/2023 0948   BILITOT 0.6 04/21/2023 0948   GFRNONAA >60 04/21/2023 0948   Lab Results  Component Value Date   WBC 2.7 (L) 05/13/2023   NEUTROABS 1.2 (L) 05/13/2023   HGB 14.6 05/13/2023   HCT 42.1 05/13/2023   MCV 89.8 05/13/2023   PLT 196 05/13/2023    Assessment/Plan Focal seizures (HCC)  Glioblastoma, IDH-wildtype (HCC) - Plan: Clinic Appointment Request  Hunter Terry presents today for scheduled CPT-11 and Avastin.  Labs demonstrate improved neutrophil count, mild proteniuria.  Recommended proceeding with cycle #5, day 1 of Irinotecan 125mg /m2 IV q3 weeks, with concurrent Avastin 10mg /kg IV q3 weeks.  We reviewed side effects, including diarrhea.  Avastin has been previously well tolerated.  Imodium will be provided for use at home if needed.  Schedule will continue q3 weeks due to ctyopenias.  Chemotherapy should be held for the following:  ANC less than 1,000  Platelets less than 100,000  LFT or creatinine greater than 2x ULN  If clinical concerns/contraindications develop  Avastin should be held for the following:  ANC less than 500  Platelets less than 50,000  LFT or creatinine greater than 2x ULN  If clinical concerns/contraindications develop  Keppra will stay at 750mg  BID for now.  We appreciate the opportunity to participate in the care of Hunter Terry.     We ask that Hunter Terry return in 3 weeks with labs for evaluation, or sooner as needed.  MRI brain ordered for 06/19/23.  All questions were answered. The patient knows to call  the clinic with any problems, questions or concerns. No barriers to learning were detected.  The total time spent in the encounter was 30 minutes and more than 50% was on counseling and review of test results   Henreitta Leber, MD Medical Director of Neuro-Oncology [facility] at [facility] 05/13/23 10:02 AM

## 2023-05-14 ENCOUNTER — Other Ambulatory Visit: Payer: Self-pay

## 2023-05-15 ENCOUNTER — Other Ambulatory Visit: Payer: Self-pay

## 2023-05-30 ENCOUNTER — Telehealth: Payer: Self-pay | Admitting: Internal Medicine

## 2023-05-30 NOTE — Telephone Encounter (Signed)
Per interpreter Raynelle Fanning, pt wants to cancel appts for 10/22 due to not feeling well and will be here for next appt 11/12.

## 2023-06-03 ENCOUNTER — Ambulatory Visit: Payer: Medicare Other

## 2023-06-03 ENCOUNTER — Inpatient Hospital Stay: Payer: Medicare Other | Admitting: Internal Medicine

## 2023-06-03 ENCOUNTER — Inpatient Hospital Stay: Payer: Medicare Other

## 2023-06-11 ENCOUNTER — Ambulatory Visit (HOSPITAL_COMMUNITY)
Admission: RE | Admit: 2023-06-11 | Discharge: 2023-06-11 | Disposition: A | Discharge status: Home / Self Care | Payer: Medicare Other | Source: Ambulatory Visit | Attending: Internal Medicine | Admitting: Internal Medicine

## 2023-06-11 DIAGNOSIS — C719 Malignant neoplasm of brain, unspecified: Secondary | ICD-10-CM | POA: Insufficient documentation

## 2023-06-11 MED ORDER — GADOBUTROL 1 MMOL/ML IV SOLN
7.0000 mL | Freq: Once | INTRAVENOUS | Status: AC | PRN
  Administered 2023-06-11: 7 mL via INTRAVENOUS

## 2023-06-13 ENCOUNTER — Telehealth: Payer: Self-pay | Admitting: *Deleted

## 2023-06-13 ENCOUNTER — Other Ambulatory Visit: Payer: Self-pay | Admitting: *Deleted

## 2023-06-13 DIAGNOSIS — C719 Malignant neoplasm of brain, unspecified: Secondary | ICD-10-CM

## 2023-06-13 NOTE — Telephone Encounter (Signed)
-----   Message from Henreitta Leber sent at 06/13/2023  9:35 AM EDT ----- Yes let's do CBC and CMP as well ----- Message ----- From: Arville Care, RN Sent: 06/13/2023   9:35 AM EDT To: Henreitta Leber, MD  Just a MD visit, do you want labs or anything? ----- Message ----- From: Henreitta Leber, MD Sent: 06/13/2023   9:31 AM EDT To: Arville Care, RN  10am monday ----- Message ----- From: Arville Care, RN Sent: 06/13/2023   9:31 AM EDT To: Henreitta Leber, MD  Hey, the Spanish interpreter called for this patient, he has seen his most recent MRI results & is asking if he needs to come in sooner than 11/12.  Just let me know if you want any changes.  Darel Hong

## 2023-06-13 NOTE — Telephone Encounter (Signed)
PC to Raynelle Fanning, [place] interpreter - informed her Dr Barbaraann Cao wants to see patient on Monday, 06/16/23 at 10:00.  Also wants labs drawn, appointment for labs is at 9:45.  She verbalizes understanding, will inform patient.  Lab orders entered.

## 2023-06-16 ENCOUNTER — Inpatient Hospital Stay (HOSPITAL_BASED_OUTPATIENT_CLINIC_OR_DEPARTMENT_OTHER): Payer: Medicare Other | Admitting: Internal Medicine

## 2023-06-16 ENCOUNTER — Encounter: Payer: Self-pay | Admitting: Internal Medicine

## 2023-06-16 ENCOUNTER — Inpatient Hospital Stay: Payer: Medicare Other | Attending: Internal Medicine

## 2023-06-16 VITALS — BP 123/75 | HR 68 | Temp 97.9°F | Resp 20 | Wt 153.5 lb

## 2023-06-16 DIAGNOSIS — Z9221 Personal history of antineoplastic chemotherapy: Secondary | ICD-10-CM | POA: Diagnosis not present

## 2023-06-16 DIAGNOSIS — Z923 Personal history of irradiation: Secondary | ICD-10-CM | POA: Diagnosis not present

## 2023-06-16 DIAGNOSIS — H53461 Homonymous bilateral field defects, right side: Secondary | ICD-10-CM | POA: Insufficient documentation

## 2023-06-16 DIAGNOSIS — R569 Unspecified convulsions: Secondary | ICD-10-CM

## 2023-06-16 DIAGNOSIS — Z5111 Encounter for antineoplastic chemotherapy: Secondary | ICD-10-CM | POA: Diagnosis present

## 2023-06-16 DIAGNOSIS — R42 Dizziness and giddiness: Secondary | ICD-10-CM | POA: Insufficient documentation

## 2023-06-16 DIAGNOSIS — C719 Malignant neoplasm of brain, unspecified: Secondary | ICD-10-CM

## 2023-06-16 DIAGNOSIS — Z79899 Other long term (current) drug therapy: Secondary | ICD-10-CM | POA: Insufficient documentation

## 2023-06-16 DIAGNOSIS — R400 Somnolence: Secondary | ICD-10-CM | POA: Insufficient documentation

## 2023-06-16 DIAGNOSIS — C712 Malignant neoplasm of temporal lobe: Secondary | ICD-10-CM | POA: Diagnosis not present

## 2023-06-16 LAB — CBC WITH DIFFERENTIAL (CANCER CENTER ONLY)
Abs Immature Granulocytes: 0.01 10*3/uL (ref 0.00–0.07)
Basophils Absolute: 0 10*3/uL (ref 0.0–0.1)
Basophils Relative: 1 %
Eosinophils Absolute: 0.1 10*3/uL (ref 0.0–0.5)
Eosinophils Relative: 1 %
HCT: 42.2 % (ref 39.0–52.0)
Hemoglobin: 14.4 g/dL (ref 13.0–17.0)
Immature Granulocytes: 0 %
Lymphocytes Relative: 17 %
Lymphs Abs: 0.7 10*3/uL (ref 0.7–4.0)
MCH: 30.3 pg (ref 26.0–34.0)
MCHC: 34.1 g/dL (ref 30.0–36.0)
MCV: 88.7 fL (ref 80.0–100.0)
Monocytes Absolute: 0.5 10*3/uL (ref 0.1–1.0)
Monocytes Relative: 12 %
Neutro Abs: 3.1 10*3/uL (ref 1.7–7.7)
Neutrophils Relative %: 69 %
Platelet Count: 205 10*3/uL (ref 150–400)
RBC: 4.76 MIL/uL (ref 4.22–5.81)
RDW: 13.5 % (ref 11.5–15.5)
WBC Count: 4.4 10*3/uL (ref 4.0–10.5)
nRBC: 0 % (ref 0.0–0.2)

## 2023-06-16 LAB — CMP (CANCER CENTER ONLY)
ALT: 68 U/L — ABNORMAL HIGH (ref 10–47)
AST: 52 U/L — ABNORMAL HIGH (ref 15–41)
Albumin: 4.4 g/dL (ref 3.5–5.0)
Alkaline Phosphatase: 216 U/L — ABNORMAL HIGH (ref 38–126)
Anion gap: 4 — ABNORMAL LOW (ref 5–15)
BUN: 9 mg/dL (ref 8–23)
CO2: 31 mmol/L (ref 22–32)
Calcium: 9.9 mg/dL (ref 8.9–10.3)
Chloride: 100 mmol/L (ref 98–111)
Creatinine: 0.85 mg/dL (ref 0.60–1.20)
GFR, Estimated: >60 mL/min (ref >60)
Glucose, Bld: 108 mg/dL — ABNORMAL HIGH (ref 70–99)
Potassium: 4.8 mmol/L (ref 3.5–5.1)
Sodium: 135 mmol/L (ref 135–145)
Total Bilirubin: 0.7 mg/dL (ref <1.2)
Total Protein: 7.4 g/dL (ref 6.5–8.1)

## 2023-06-16 NOTE — Progress Notes (Signed)
[facility] at [facility] [address]  [place], [place] [address] [phone]   Interval Evaluation  Date of Service: 06/16/23 [name] Patient MRN: [id number] Patient DOB: 07-25-1961 Provider: Henreitta Leber, MD  Identifying Statement:  Hunter Terry is a 62 y.o. male with left temporal glioblastoma   Oncologic History: Oncology History  Glioblastoma, IDH-wildtype (HCC)  06/15/2020 Surgery   Stereotactic biopsy, L temporal Maisie Fus).  Path demonstrates glioblastoma IDH-1 wt   08/02/2020 Surgery   Debulking craniotomy with Dr. Maisie Fus   09/05/2020 - 10/17/2020 Radiation Therapy   IMRT with concurrent Temozolomide [name])   11/19/2020 - 06/18/2021 Chemotherapy   Completes #7 cycles adjuvant 5-day Temozolomide       07/17/2021 Progression   Progression of disease #1   07/18/2021 - 07/10/2022 Chemotherapy   Completes 8 cycles of CCNU 90mg /m2 PO q6 weeks and Avastin 10mg /kg IV q2 weeks   07/11/2022 Progression   Progression of disease #2   09/05/2022 Surgery   Repeat left temporal craniotomy with Dr. Maisie Fus; path is Glioblastoma IDHwt   09/19/2022 -  Chemotherapy   Initiates metronomic Temozolomide, 50mg /m2 PO daily   09/23/2022 - 09/23/2022 Chemotherapy   Patient is on Treatment Plan : BRAIN Low Grade Glioma Grade II, Glioblastoma,  Astrocytoma, Oligodendroma, Recurrent or Progressive / Temozolomide D1-5 Q28 Days     10/27/2022 Progression   Progression of disease #3   11/04/2022 - 06/15/2023 Chemotherapy   Irinotecan and Avastin IV q2-3 weeks   06/16/2023 Progression   Progression of disease #4   06/24/2023 -  Chemotherapy   Patient is on Treatment Plan : Brain Bevacizumab D1, D15 + Carboplatin (4) D1 q28d x 6 cycles       Biomarkers:  MGMT Unknown.  IDH 1/2 Wild type.  EGFR Unknown  TERT "Mutated   Interval History: Hunter Terry presents today following recent MRI brain.  He describes impaired vision on his left  side, new from prior.  Also has been having right arm and leg numbness.  Otherwise continues to walk independently, speech has been normal.  Continues on Keppra 750mg  BID, no headaches.  H+P (07/24/20) Patient presented to medical attention in late October, 2021 with new onset seizure.  Event was decribed as loss of consciousness, sudden, without clarity on further details aside from altered mental status upon awakening.  CNS imaging demonsrated non-enhancing mass within left temporal lobe c/w likely glioma; he underwent stereotactic biopsy with Dr. Maisie Fus on 06/15/20.  There was significant delay in finalizing path, explaining delay in follow up and evaluation.  He denies any seizures since discharge from hospital, on 4 anti-seizure drugs.  He does complain of dizziness and drowsiness with the medications, however.  Also describes impaired short term memory.  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    Medications: Current Outpatient Medications on File Prior to Visit  Medication Sig Dispense Refill   acetaminophen (TYLENOL) 500 MG tablet Take 500 mg by mouth every 6 (six) hours as needed for moderate pain.     levETIRAcetam (KEPPRA) 750 MG tablet Take 1 tablet (750 mg total) by mouth 2 (two) times daily. 60 tablet 3   loperamide (IMODIUM) 2 MG capsule Take 2 tabs by mouth with first loose stool, then 1 tab with each additional loose stool as needed. Do not exceed 8 tabs in a 24-hour period 30 capsule 1   Multiple Vitamin (MULTIVITAMIN WITH MINERALS) TABS tablet Take 1 tablet by mouth daily.  ondansetron (ZOFRAN) 8 MG tablet Take 1 tablet (8 mg total) by mouth every 8 (eight) hours as needed for nausea, vomiting or refractory nausea / vomiting. Start on the third day after chemotherapy. 30 tablet 1   [DISCONTINUED] lacosamide (VIMPAT) 200 MG TABS tablet Take 0.5 tablets (100 mg total) by mouth 2 (two) times daily. 30 tablet 0   No current facility-administered medications on file prior to  visit.    Allergies: No Known Allergies Past Medical History:  Past Medical History:  Diagnosis Date   Cancer (HCC)    BRAIN TUMOR   Headache    Seizure [facility])    Past Surgical History:  Past Surgical History:  Procedure Laterality Date   APPLICATION OF CRANIAL NAVIGATION N/A 06/15/2020   Procedure: APPLICATION OF CRANIAL NAVIGATION;  Surgeon: Bedelia Person, MD;  Location: [facility] OR;  Service: Neurosurgery;  Laterality: N/A;   APPLICATION OF CRANIAL NAVIGATION N/A 08/02/2020   Procedure: APPLICATION OF CRANIAL NAVIGATION;  Surgeon: Bedelia Person, MD;  Location: [facility] OR;  Service: Neurosurgery;  Laterality: N/A;   APPLICATION OF CRANIAL NAVIGATION Left 09/05/2022   Procedure: APPLICATION OF CRANIAL NAVIGATION;  Surgeon: Bedelia Person, MD;  Location: [facility] OR;  Service: Neurosurgery;  Laterality: Left;   CRANIOTOMY N/A 08/02/2020   Procedure: CRANIOTOMY LEFT TEMPORAL LOBECTOMY FOR TUMOR EXCISION;  Surgeon: Bedelia Person, MD;  Location: [facility] OR;  Service: Neurosurgery;  Laterality: N/A;   CRANIOTOMY Left 09/05/2022   Procedure: CRANIOTOMY FOR RESECTION OF TEMPORAL LOBE TUMOR;  Surgeon: Bedelia Person, MD;  Location: [facility] OR;  Service: Neurosurgery;  Laterality: Left;   FRAMELESS  BIOPSY WITH BRAINLAB Left 06/15/2020   Procedure: Left temporal lobe stereotactic brain biopsy with brainlab;  Surgeon: Bedelia Person, MD;  Location: [facility] OR;  Service: Neurosurgery;  Laterality: Left;   Social History:  Social History   Socioeconomic History   Marital status: Married    Spouse name: Not on file   Number of children: Not on file   Years of education: Not on file   Highest education level: Not on file  Occupational History   Not on file  Tobacco Use   Smoking status: Never   Smokeless tobacco: Never  Vaping Use   Vaping status: Never Used  Substance and Sexual Activity   Alcohol use: Yes    Comment: 1 beer a month   Drug use: Never   Sexual activity: Yes    Partners:  Female  Other Topics Concern   Not on file  Social History Narrative   Not on file   Social Determinants of Health   Financial Resource Strain: Not on file  Food Insecurity: Not on file  Transportation Needs: Not on file  Physical Activity: Not on file  Stress: Not on file  Social Connections: Unknown (12/24/2021)   [facility] from [facility], [facility]   Social Network    Social Network: Not on file  Intimate Partner Violence: Unknown (11/13/2021)   [facility], [facility]   HITS    Physically Hurt: Not on file    Insult or Talk Down To: Not on file    Threaten Physical Harm: Not on file    Scream or Curse: Not on file   Family History:  Family History  Problem Relation Age of Onset   Cancer Neg Hx     Review of Systems: Constitutional: Doesn't report fevers, chills or abnormal weight loss Eyes: Doesn't report blurriness of vision Ears, nose,  mouth, throat, and face: Doesn't report sore throat Respiratory: Doesn't report cough, dyspnea or wheezes Cardiovascular: Doesn't report palpitation, chest discomfort  Gastrointestinal:  Doesn't report nausea, constipation, diarrhea GU: Doesn't report incontinence Skin: Doesn't report skin rashes Neurological: Per HPI Musculoskeletal: Doesn't report joint pain Behavioral/Psych: Doesn't report anxiety  Physical Exam: Vitals:   06/16/23 1048  BP: 123/75  Pulse: 68  Resp: 20  Temp: 97.9 F (36.6 C)  SpO2: 98%   KPS: 80. General: Alert, cooperative, pleasant, in no acute distress Head: Normal EENT: No conjunctival injection or scleral icterus.  Lungs: Resp effort normal Cardiac: Regular rate Abdomen: Non-distended abdomen Skin: No rashes cyanosis or petechiae. Extremities: No clubbing or edema  Neurologic Exam: Mental Status: Awake, alert, attentive to examiner. Oriented to self and environment. Language has mildly impaired fluency with intact comprehension.  Cranial Nerves: Visual acuity is  grossly normal. Right homonymous hemianopia. Extra-ocular movements intact. No ptosis. Face is symmetric Motor: Tone and bulk are normal. Power is full in both arms and legs. Reflexes are symmetric, no pathologic reflexes present.  Sensory: Intact to light touch Gait: Normal.   Labs: I have reviewed the data as listed    Component Value Date/Time   NA 139 05/13/2023 0938   K 3.8 05/13/2023 0938   CL 103 05/13/2023 0938   CO2 29 05/13/2023 0938   GLUCOSE 122 (H) 05/13/2023 0938   BUN 11 05/13/2023 0938   CREATININE 0.83 05/13/2023 0938   CALCIUM 9.9 05/13/2023 0938   PROT 7.0 05/13/2023 0938   ALBUMIN 4.4 05/13/2023 0938   AST 25 05/13/2023 0938   ALT 29 05/13/2023 0938   ALKPHOS 110 05/13/2023 0938   BILITOT 0.6 05/13/2023 0938   GFRNONAA >60 05/13/2023 0938   Lab Results  Component Value Date   WBC 4.4 06/16/2023   NEUTROABS 3.1 06/16/2023   HGB 14.4 06/16/2023   HCT 42.2 06/16/2023   MCV 88.7 06/16/2023   PLT 205 06/16/2023   Imaging:  [facility] Clinician Interpretation: I have personally reviewed the CNS images as listed.  My interpretation, in the context of the patient's clinical presentation, is progressive disease  MR BRAIN W WO CONTRAST  Result Date: 06/11/2023 CLINICAL DATA:  Glioblastoma, assess treatment response EXAM: MRI HEAD WITHOUT AND WITH CONTRAST TECHNIQUE: Multiplanar, multiecho pulse sequences of the brain and surrounding structures were obtained without and with intravenous contrast. CONTRAST:  7mL GADAVIST GADOBUTROL 1 MMOL/ML IV SOLN COMPARISON:  03/31/2023 MRI head FINDINGS: Brain: Status post left frontotemporal craniotomy with resection cavity in the subjacent left temporal lobe. Increased nodular enhancement about the resection cavity, particularly along the medial left temporal lobe (series 16, image 74) and along the anterior and superior margin (series 18, image 19). For comparison, on the prior exam, enhancement along the anterior superior margin  measured 5 mm (series 18, image 20 of the prior study) and now measures 14 mm on the current study. Enhancement along the posterolateral margin now measures up to 7 mm (series 16, image 77), previously 3 mm (series 16, image 80 on the prior study). The peripherally enhancing tumor now extends more posteriorly (series 16, image 68) along the left occipital horn. No evidence of enhancement in the right cerebral hemisphere. This is associated with increased T2 hyperintense signal, which is most prominent in the left temporal lobe posteriorly (series 9, image 22 and 29) and laterally (series 9, image 28), as well as in the left insula and medial temporal lobe (series 9, image 28) and in the  left periventricular frontal lobe (series 9, image 34). Hemosiderin deposition is associated with the enhancing part of the tumor and the resection cavity, with new areas of hemosiderin deposition along the posterior aspect (series 10, image 29) and superior aspect (series 10, image 35). No evidence of acute infarct or hemorrhage. No significant midline shift. No hydrocephalus or new extra-axial collection. Redemonstrated trace extra-axial collection subjacent to the craniotomy flap. Vascular: Normal arterial flow voids. Normal arterial and venous enhancement. Skull and upper cervical spine: Left frontotemporal craniotomy. Otherwise normal marrow signal. Sinuses/Orbits: Mucosal thickening in the left greater than right maxillary sinus and left greater than right ethmoid air cells. No acute finding in the orbits. Other: The mastoids are well aerated. IMPRESSION: Increased nodular enhancement about the left temporal resection cavity, with increased T2 hyperintense signal in the left frontal and temporal lobes. Findings are concerning for tumor progression. Electronically Signed   By: Wiliam Ke M.D.   On: 06/11/2023 18:17     Assessment/Plan Glioblastoma, IDH-wildtype (HCC)  Focal seizures (HCC)  Hunter Terry is clinically  progressive today, with new visual field and sensory deficits.  MRI brain demonstrates clear progression of disease locally, within left temporal lobe.    He would like to continue with salvage systemic therapy given good functional status.  Blood counts are improved today as well.  Recommended transitioning to next line of therapy, Carboplatin AUC 4 IV q4 weeks, with concurrent Avastin 10mg /kg IV q2 weeks.  We reviewed side effects, including nausea, fatigue, cytopenias.  Avastin has been previously well tolerated.   Chemotherapy should be held for the following:  ANC less than 1,000  Platelets less than 100,000  LFT or creatinine greater than 2x ULN  If clinical concerns/contraindications develop  Avastin should be held for the following:  ANC less than 500  Platelets less than 50,000  LFT or creatinine greater than 2x ULN  If clinical concerns/contraindications develop  Keppra will stay at 750mg  BID for now.  We appreciate the opportunity to participate in the care of Hunter Terry.     We ask that [employer] return once cycle #1 is scheduled.    All questions were answered. The patient knows to call the clinic with any problems, questions or concerns. No barriers to learning were detected.  The total time spent in the encounter was 40 minutes and more than 50% was on counseling and review of test results   Henreitta Leber, MD Medical Director of Neuro-Oncology [facility] 06/16/23 10:49 AM

## 2023-06-19 ENCOUNTER — Encounter (HOSPITAL_COMMUNITY): Payer: Self-pay

## 2023-06-19 ENCOUNTER — Telehealth: Payer: Self-pay | Admitting: *Deleted

## 2023-06-19 ENCOUNTER — Telehealth: Payer: Self-pay | Admitting: Oncology

## 2023-06-19 NOTE — Telephone Encounter (Signed)
I received a call regarding this gentleman overnight. He has GBM and follows with Dr.Vaslow in our clinic. Recent MRI of brain showed evidence of disease progression and Dr. Barbaraann Cao is planning to switch treatments to carboplatin and Avastin.  He presented to ED at [facility] with an episode of seizure. Apparently he was taking Keppra once daily and not twice daily as instructed. They gave him Keppra in the ED and a dose of decadron. Patient did not have further episodes of seizures and his mentation was back to baseline per the report. ED provider was going to offer hospital admission, if patient agreeable. Gave instructions on appropriate dosing of Keppra.   Otherwise he is scheduled to begin new regimen on 06/24/23.   I will send this information to Dr. Barbaraann Cao for continuity of care.

## 2023-06-19 NOTE — Telephone Encounter (Signed)
-----   Message from Henreitta Leber sent at 06/19/2023 10:10 AM EST ----- Rip Harbour, we can attribute seizure to medication error, keep everything the same ----- Message ----- From: Arville Care, RN Sent: 06/19/2023  10:05 AM EST To: Henreitta Leber, MD  This patient called our Spanish interpreter Raynelle Fanning) this morning - he was seen in the ED in [facility] overnight due to a seizure, Dr Arlana Pouch put a note in about this, he was on call.  Evidently he was not taking his Keppra correctly.  He was only taking it once daily instead of twice.  Raynelle Fanning is calling him to verify that he is now taking it correctly.  He is asking if you have any other recommendations for him.  He has an appointment on 11/12.  Darel Hong

## 2023-06-19 NOTE — Telephone Encounter (Signed)
PC to [place], [place] interpreter - informed her, per Dr Barbaraann Cao, as long as patient is taking his Keppra correctly (750 mg twice a day) he just needs to F/U here as scheduled on 06/24/23.  According to Raynelle Fanning, she spoke with the patient's wife & verified that he is taking the Keppra as prescribed.  They are planning to keep appointment on the 12th.  Raynelle Fanning instructed patient's wife to contact this office with any further questions/concerns, she verbalized understanding.

## 2023-06-20 ENCOUNTER — Other Ambulatory Visit: Payer: Self-pay

## 2023-06-23 MED FILL — Fosaprepitant Dimeglumine For IV Infusion 150 MG (Base Eq): INTRAVENOUS | Qty: 5 | Status: AC

## 2023-06-24 ENCOUNTER — Inpatient Hospital Stay: Payer: Medicare Other

## 2023-06-24 ENCOUNTER — Inpatient Hospital Stay (HOSPITAL_BASED_OUTPATIENT_CLINIC_OR_DEPARTMENT_OTHER): Payer: Medicare Other | Admitting: Internal Medicine

## 2023-06-24 VITALS — BP 127/79 | HR 66 | Resp 16

## 2023-06-24 VITALS — BP 125/81 | HR 76 | Temp 98.3°F | Resp 16 | Ht 66.0 in | Wt 152.3 lb

## 2023-06-24 DIAGNOSIS — C719 Malignant neoplasm of brain, unspecified: Secondary | ICD-10-CM | POA: Diagnosis not present

## 2023-06-24 DIAGNOSIS — R569 Unspecified convulsions: Secondary | ICD-10-CM | POA: Diagnosis not present

## 2023-06-24 DIAGNOSIS — Z5111 Encounter for antineoplastic chemotherapy: Secondary | ICD-10-CM | POA: Diagnosis not present

## 2023-06-24 LAB — TOTAL PROTEIN, URINE DIPSTICK: Protein, ur: NEGATIVE mg/dL

## 2023-06-24 LAB — CBC WITH DIFFERENTIAL (CANCER CENTER ONLY)
Abs Immature Granulocytes: 0.11 10*3/uL — ABNORMAL HIGH (ref 0.00–0.07)
Basophils Absolute: 0 10*3/uL (ref 0.0–0.1)
Basophils Relative: 0 %
Eosinophils Absolute: 0 10*3/uL (ref 0.0–0.5)
Eosinophils Relative: 0 %
HCT: 43.5 % (ref 39.0–52.0)
Hemoglobin: 15.3 g/dL (ref 13.0–17.0)
Immature Granulocytes: 1 %
Lymphocytes Relative: 14 %
Lymphs Abs: 1.1 10*3/uL (ref 0.7–4.0)
MCH: 31 pg (ref 26.0–34.0)
MCHC: 35.2 g/dL (ref 30.0–36.0)
MCV: 88.1 fL (ref 80.0–100.0)
Monocytes Absolute: 0.6 10*3/uL (ref 0.1–1.0)
Monocytes Relative: 7 %
Neutro Abs: 6 10*3/uL (ref 1.7–7.7)
Neutrophils Relative %: 78 %
Platelet Count: 300 10*3/uL (ref 150–400)
RBC: 4.94 MIL/uL (ref 4.22–5.81)
RDW: 13.8 % (ref 11.5–15.5)
WBC Count: 7.8 10*3/uL (ref 4.0–10.5)
nRBC: 0.3 % — ABNORMAL HIGH (ref 0.0–0.2)

## 2023-06-24 LAB — CMP (CANCER CENTER ONLY)
ALT: 136 U/L — ABNORMAL HIGH (ref 0–44)
AST: 66 U/L — ABNORMAL HIGH (ref 15–41)
Albumin: 4.3 g/dL (ref 3.5–5.0)
Alkaline Phosphatase: 220 U/L — ABNORMAL HIGH (ref 38–126)
Anion gap: 8 (ref 5–15)
BUN: 20 mg/dL (ref 8–23)
CO2: 29 mmol/L (ref 22–32)
Calcium: 9.7 mg/dL (ref 8.9–10.3)
Chloride: 98 mmol/L (ref 98–111)
Creatinine: 0.81 mg/dL (ref 0.61–1.24)
GFR, Estimated: >60 mL/min (ref >60)
Glucose, Bld: 180 mg/dL — ABNORMAL HIGH (ref 70–99)
Potassium: 4 mmol/L (ref 3.5–5.1)
Sodium: 135 mmol/L (ref 135–145)
Total Bilirubin: 0.5 mg/dL (ref <1.2)
Total Protein: 7.3 g/dL (ref 6.5–8.1)

## 2023-06-24 MED ORDER — BEVACIZUMAB-ADCD CHEMO INJECTION 400 MG/16ML
10.0000 mg/kg | Freq: Once | INTRAVENOUS | Status: AC
  Administered 2023-06-24: 700 mg via INTRAVENOUS
  Filled 2023-06-24: qty 16

## 2023-06-24 MED ORDER — SODIUM CHLORIDE 0.9 % IV SOLN
150.0000 mg | Freq: Once | INTRAVENOUS | Status: AC
  Administered 2023-06-24: 150 mg via INTRAVENOUS
  Filled 2023-06-24: qty 150

## 2023-06-24 MED ORDER — ONDANSETRON HCL 8 MG PO TABS: 8.0000 mg | ORAL_TABLET | Freq: Three times a day (TID) | ORAL | 1 refills | Status: DC | PRN

## 2023-06-24 MED ORDER — CARBOPLATIN CHEMO INJECTION 600 MG/60ML
472.4000 mg | Freq: Once | INTRAVENOUS | Status: AC
  Administered 2023-06-24: 470 mg via INTRAVENOUS
  Filled 2023-06-24: qty 47

## 2023-06-24 MED ORDER — DEXAMETHASONE SODIUM PHOSPHATE 10 MG/ML IJ SOLN
10.0000 mg | Freq: Once | INTRAMUSCULAR | Status: AC
  Administered 2023-06-24: 10 mg via INTRAVENOUS
  Filled 2023-06-24: qty 1

## 2023-06-24 MED ORDER — PALONOSETRON HCL INJECTION 0.25 MG/5ML
0.2500 mg | Freq: Once | INTRAVENOUS | Status: AC
Start: 2023-06-24 — End: 2023-06-24
  Administered 2023-06-24: 0.25 mg via INTRAVENOUS
  Filled 2023-06-24: qty 5

## 2023-06-24 MED ORDER — SODIUM CHLORIDE 0.9 % IV SOLN
INTRAVENOUS | Status: DC
Start: 2023-06-24 — End: 2023-06-24

## 2023-06-24 NOTE — Progress Notes (Signed)
[facility] at [facility] [address]  [place], [place] [address] [phone]   Interval Evaluation  Date of Service: 06/24/23 [name] Patient MRN: [id number] Patient DOB: [birth year] Provider: Henreitta Leber, MD  Identifying Statement:  Hunter Terry is a 62 y.o. male with left temporal glioblastoma   Oncologic History: Oncology History  Glioblastoma, IDH-wildtype (HCC)  06/15/2020 Surgery   Stereotactic biopsy, L temporal Maisie Fus).  Path demonstrates glioblastoma IDH-1 wt   08/02/2020 Surgery   Debulking craniotomy with Dr. Maisie Fus   09/05/2020 - 10/17/2020 Radiation Therapy   IMRT with concurrent Temozolomide [name])   11/19/2020 - 06/18/2021 Chemotherapy   Completes #7 cycles adjuvant 5-day Temozolomide       07/17/2021 Progression   Progression of disease #1   07/18/2021 - 07/10/2022 Chemotherapy   Completes 8 cycles of CCNU 90mg /m2 PO q6 weeks and Avastin 10mg /kg IV q2 weeks   07/11/2022 Progression   Progression of disease #2   09/05/2022 Surgery   Repeat left temporal craniotomy with Dr. Maisie Fus; path is Glioblastoma IDHwt   09/19/2022 -  Chemotherapy   Initiates metronomic Temozolomide, 50mg /m2 PO daily   09/23/2022 - 09/23/2022 Chemotherapy   Patient is on Treatment Plan : BRAIN Low Grade Glioma Grade II, Glioblastoma,  Astrocytoma, Oligodendroma, Recurrent or Progressive / Temozolomide D1-5 Q28 Days     10/27/2022 Progression   Progression of disease #3   11/04/2022 - 06/15/2023 Chemotherapy   Irinotecan and Avastin IV q2-3 weeks   06/16/2023 Progression   Progression of disease #4   06/24/2023 -  Chemotherapy   Patient is on Treatment Plan : Brain Bevacizumab D1, D15 + Carboplatin (4) D1 q28d x 6 cycles       Biomarkers:  MGMT Unknown.  IDH 1/2 Wild type.  EGFR Unknown  TERT "Mutated   Interval History: Hunter Terry presents today for initiation of carboplatin.  Did have brief seizure this week  (speech arrest) after forgetting his AM keppra.  He describes ongoing impaired vision on his right side, stable from prior.  Also has been having right arm and leg numbness.  Otherwise continues to walk independently, speech has been normal.  Continues on Keppra 750mg  BID, no headaches.  H+P (07/24/20) Patient presented to medical attention in late October, 2021 with new onset seizure.  Event was decribed as loss of consciousness, sudden, without clarity on further details aside from altered mental status upon awakening.  CNS imaging demonsrated non-enhancing mass within left temporal lobe c/w likely glioma; he underwent stereotactic biopsy with Dr. Maisie Fus on 06/15/20.  There was significant delay in finalizing path, explaining delay in follow up and evaluation.  He denies any seizures since discharge from hospital, on 4 anti-seizure drugs.  He does complain of dizziness and drowsiness with the medications, however.  Also describes impaired short term memory.  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    Medications: Current Outpatient Medications on File Prior to Visit  Medication Sig Dispense Refill   acetaminophen (TYLENOL) 500 MG tablet Take 500 mg by mouth every 6 (six) hours as needed for moderate pain.     levETIRAcetam (KEPPRA) 750 MG tablet Take 1 tablet (750 mg total) by mouth 2 (two) times daily. 60 tablet 3   Multiple Vitamin (MULTIVITAMIN WITH MINERALS) TABS tablet Take 1 tablet by mouth daily.     [DISCONTINUED] lacosamide (VIMPAT) 200 MG TABS tablet Take 0.5 tablets (100 mg total) by mouth 2 (two) times daily. 30  tablet 0   No current facility-administered medications on file prior to visit.    Allergies: No Known Allergies Past Medical History:  Past Medical History:  Diagnosis Date   Cancer (HCC)    BRAIN TUMOR   Headache    Seizure [facility])    Past Surgical History:  Past Surgical History:  Procedure Laterality Date   APPLICATION OF CRANIAL NAVIGATION N/A 06/15/2020    Procedure: APPLICATION OF CRANIAL NAVIGATION;  Surgeon: Bedelia Person, MD;  Location: [facility] OR;  Service: Neurosurgery;  Laterality: N/A;   APPLICATION OF CRANIAL NAVIGATION N/A 08/02/2020   Procedure: APPLICATION OF CRANIAL NAVIGATION;  Surgeon: Bedelia Person, MD;  Location[facility] OR;  Service: Neurosurgery;  Laterality: N/A;   APPLICATION OF CRANIAL NAVIGATION Left 09/05/2022   Procedure: APPLICATION OF CRANIAL NAVIGATION;  Surgeon: Bedelia Person, MD;  Location: [facility] OR;  Service: Neurosurgery;  Laterality: Left;   CRANIOTOMY N/A 08/02/2020   Procedure: CRANIOTOMY LEFT TEMPORAL LOBECTOMY FOR TUMOR EXCISION;  Surgeon: Bedelia Person, MD;  Location: [facility] OR;  Service: Neurosurgery;  Laterality: N/A;   CRANIOTOMY Left 09/05/2022   Procedure: CRANIOTOMY FOR RESECTION OF TEMPORAL LOBE TUMOR;  Surgeon: Bedelia Person, MD;  Location: [facility] OR;  Service: Neurosurgery;  Laterality: Left;   FRAMELESS  BIOPSY WITH BRAINLAB Left 06/15/2020   Procedure: Left temporal lobe stereotactic brain biopsy with brainlab;  Surgeon: Bedelia Person, MD;  Location: [facility] OR;  Service: Neurosurgery;  Laterality: Left;   Social History:  Social History   Socioeconomic History   Marital status: Married    Spouse name: Not on file   Number of children: Not on file   Years of education: Not on file   Highest education level: Not on file  Occupational History   Not on file  Tobacco Use   Smoking status: Never   Smokeless tobacco: Never  Vaping Use   Vaping status: Never Used  Substance and Sexual Activity   Alcohol use: Yes    Comment: 1 beer a month   Drug use: Never   Sexual activity: Yes    Partners: Female  Other Topics Concern   Not on file  Social History Narrative   Not on file   Social Determinants of Health   Financial Resource Strain: Not on file  Food Insecurity: Not on file  Transportation Needs: Not on file  Physical Activity: Not on file  Stress: Not on file  Social  Connections: Unknown (12/24/2021)   [facility]   Social Network    Social Network: Not on file  Intimate Partner Violence: Unknown (11/13/2021)   Received from [employer], [facility]   HITS    Physically Hurt: Not on file    Insult or Talk Down To: Not on file    Threaten Physical Harm: Not on file    Scream or Curse: Not on file   Family History:  Family History  Problem Relation Age of Onset   Cancer Neg Hx     Review of Systems: Constitutional: Doesn't report fevers, chills or abnormal weight loss Eyes: Doesn't report blurriness of vision Ears, nose, mouth, throat, and face: Doesn't report sore throat Respiratory: Doesn't report cough, dyspnea or wheezes Cardiovascular: Doesn't report palpitation, chest discomfort  Gastrointestinal:  Doesn't report nausea, constipation, diarrhea GU: Doesn't report incontinence Skin: Doesn't report skin rashes Neurological: Per HPI Musculoskeletal: Doesn't report joint pain Behavioral/Psych: Doesn't report anxiety  Physical Exam: Vitals:   06/24/23 1610  BP: 125/81  Pulse: 76  Resp: 16  Temp: 98.3 F (36.8 C)  SpO2: 98%    KPS: 80. General: Alert, cooperative, pleasant, in no acute distress Head: Normal EENT: No conjunctival injection or scleral icterus.  Lungs: Resp effort normal Cardiac: Regular rate Abdomen: Non-distended abdomen Skin: No rashes cyanosis or petechiae. Extremities: No clubbing or edema  Neurologic Exam: Mental Status: Awake, alert, attentive to examiner. Oriented to self and environment. Language has mildly impaired fluency with intact comprehension.  Cranial Nerves: Visual acuity is grossly normal. Right homonymous hemianopia. Extra-ocular movements intact. No ptosis. Face is symmetric Motor: Tone and bulk are normal. Power is full in both arms and legs. Reflexes are symmetric, no pathologic reflexes present.  Sensory: Intact to light touch Gait: Normal.   Labs: I  have reviewed the data as listed    Component Value Date/Time   NA 135 06/16/2023 1027   K 4.8 06/16/2023 1027   CL 100 06/16/2023 1027   CO2 31 06/16/2023 1027   GLUCOSE 108 (H) 06/16/2023 1027   BUN 9 06/16/2023 1027   CREATININE 0.85 06/16/2023 1027   CALCIUM 9.9 06/16/2023 1027   PROT 7.4 06/16/2023 1027   ALBUMIN 4.4 06/16/2023 1027   AST 52 (H) 06/16/2023 1027   ALT 68 (H) 06/16/2023 1027   ALKPHOS 216 (H) 06/16/2023 1027   BILITOT 0.7 06/16/2023 1027   GFRNONAA >60 06/16/2023 1027   Lab Results  Component Value Date   WBC 7.8 06/24/2023   NEUTROABS 6.0 06/24/2023   HGB 15.3 06/24/2023   HCT 43.5 06/24/2023   MCV 88.1 06/24/2023   PLT 300 06/24/2023   Imaging:  [facility] Clinician Interpretation: I have personally reviewed the CNS images as listed.  My interpretation, in the context of the patient's clinical presentation, is progressive disease  MR BRAIN W WO CONTRAST  Result Date: 06/11/2023 CLINICAL DATA:  Glioblastoma, assess treatment response EXAM: MRI HEAD WITHOUT AND WITH CONTRAST TECHNIQUE: Multiplanar, multiecho pulse sequences of the brain and surrounding structures were obtained without and with intravenous contrast. CONTRAST:  7mL GADAVIST GADOBUTROL 1 MMOL/ML IV SOLN COMPARISON:  03/31/2023 MRI head FINDINGS: Brain: Status post left frontotemporal craniotomy with resection cavity in the subjacent left temporal lobe. Increased nodular enhancement about the resection cavity, particularly along the medial left temporal lobe (series 16, image 74) and along the anterior and superior margin (series 18, image 19). For comparison, on the prior exam, enhancement along the anterior superior margin measured 5 mm (series 18, image 20 of the prior study) and now measures 14 mm on the current study. Enhancement along the posterolateral margin now measures up to 7 mm (series 16, image 77), previously 3 mm (series 16, image 80 on the prior study). The peripherally enhancing tumor now  extends more posteriorly (series 16, image 68) along the left occipital horn. No evidence of enhancement in the right cerebral hemisphere. This is associated with increased T2 hyperintense signal, which is most prominent in the left temporal lobe posteriorly (series 9, image 22 and 29) and laterally (series 9, image 28), as well as in the left insula and medial temporal lobe (series 9, image 28) and in the left periventricular frontal lobe (series 9, image 34). Hemosiderin deposition is associated with the enhancing part of the tumor and the resection cavity, with new areas of hemosiderin deposition along the posterior aspect (series 10, image 29) and superior aspect (series 10, image 35). No evidence of acute infarct or hemorrhage. No significant midline shift.  No hydrocephalus or new extra-axial collection. Redemonstrated trace extra-axial collection subjacent to the craniotomy flap. Vascular: Normal arterial flow voids. Normal arterial and venous enhancement. Skull and upper cervical spine: Left frontotemporal craniotomy. Otherwise normal marrow signal. Sinuses/Orbits: Mucosal thickening in the left greater than right maxillary sinus and left greater than right ethmoid air cells. No acute finding in the orbits. Other: The mastoids are well aerated. IMPRESSION: Increased nodular enhancement about the left temporal resection cavity, with increased T2 hyperintense signal in the left frontal and temporal lobes. Findings are concerning for tumor progression. Electronically Signed   By: Wiliam Ke M.D.   On: 06/11/2023 18:17     Assessment/Plan Glioblastoma, IDH-wildtype (HCC)  Focal seizures (HCC)  Timon Haaf presents today for initiation of next line chemotherapy given recent progression.  Recommended proceeding with next line of therapy, Carboplatin AUC 4 IV q4 weeks, with concurrent Avastin 10mg /kg IV q2 weeks.  We reviewed side effects, including nausea, fatigue, cytopenias.  Avastin has been  previously well tolerated.   Chemotherapy should be held for the following:  ANC less than 1,000  Platelets less than 100,000  LFT or creatinine greater than 2x ULN  If clinical concerns/contraindications develop  Avastin should be held for the following:  ANC less than 500  Platelets less than 50,000  LFT or creatinine greater than 2x ULN  If clinical concerns/contraindications develop  Keppra will stay at 750mg  BID for now.  We appreciate the opportunity to participate in the care of [place].     We ask that [employer] return in 2 weeks for avastin.   All questions were answered. The patient knows to call the clinic with any problems, questions or concerns. No barriers to learning were detected.  The total time spent in the encounter was 30 minutes and more than 50% was on counseling and review of test results   Henreitta Leber, MD Medical Director of Neuro-Oncology [facility] at [facility] 06/24/23 8:57 AM

## 2023-06-24 NOTE — Patient Instructions (Signed)
Instrucciones al darle de alta: Discharge Instructions Gracias por elegir al [facility] de Cncer de [place] para brindarle atencin mdica de oncologa y Teacher, English as a foreign language.   Si usted tiene una cita de laboratorio con [employer] de [place], por favor vaya directamente al [employer] de Cncer y regstrese en el rea de Engineer, maintenance (IT).   Use ropa cmoda y [place] para tener fcil acceso a las vas del Portacath (acceso venoso de Set designer duracin) o la lnea PICC (catter central colocado por va perifrica).   Nos esforzamos por ofrecerle tiempo de calidad con su proveedor. Es posible que tenga que volver a programar su cita si llega tarde (15 minutos o ms).  El llegar tarde le afecta a usted y a otros pacientes cuyas citas son posteriores a Armed forces operational officer.  Adems, si usted falta a tres o ms citas sin avisar a la oficina, puede ser retirado(a) de la clnica a discrecin del proveedor.      Para las solicitudes de renovacin de recetas, pida a su farmacia que se ponga en contacto con nuestra oficina y deje que transcurran 72 horas para que se complete el proceso de las renovaciones.    Hoy usted recibi los siguientes agentes de quimioterapia e/o inmunoterapia [place], Carboplatin      Para ayudar a prevenir las nuseas y los vmitos despus de su tratamiento, le recomendamos que tome su medicamento para las nuseas segn las indicaciones.  LOS SNTOMAS QUE DEBEN COMUNICARSE INMEDIATAMENTE SE INDICAN A CONTINUACIN: *FIEBRE SUPERIOR A 100.4 F (38 C) O MS *ESCALOFROS O SUDORACIN *NUSEAS Y VMITOS QUE NO SE CONTROLAN CON EL MEDICAMENTO PARA LAS NUSEAS *DIFICULTAD INUSUAL PARA RESPIRAR  *MORETONES O HEMORRAGIAS NO HABITUALES *PROBLEMAS URINARIOS (dolor o ardor al Geographical information systems officer o frecuencia para Geographical information systems officer) *PROBLEMAS INTESTINALES (diarrea inusual, estreimiento, dolor cerca del ano) SENSIBILIDAD EN LA BOCA Y EN LA GARGANTA CON O SIN LA PRESENCIA DE LCERAS (dolor de garganta, llagas en la boca o dolor de  muelas/dientes) ERUPCIN, HINCHAZN O DOLORES INUSUALES FLUJO VAGINAL INUSUAL O PICAZN/RASQUIA    Los puntos marcados con un asterisco ( *) indican una posible emergencia y debe hacer un seguimiento tan pronto como le sea posible o vaya al Departamento de Emergencias si se le presenta algn problema.  Por favor, muestre la [place] DE ADVERTENCIA DE Marc Morgans DE ADVERTENCIA DE [place] Fanti al registrarse en [address] de Emergencias y a la enfermera de triaje.  Si tiene preguntas despus de su visita o necesita cancelar o volver a programar su cita, por favor pngase en contacto con [facility] - A DEPT OF [facility]  Dept: [phone] y siga las instrucciones. Las horas de oficina son de 8:00 a.m. a 4:30 p.m. de lunes a viernes. Por favor, tenga en cuenta que los mensajes de voz que se dejan despus de las 4:00 p.m. posiblemente no se devolvern hasta el siguiente da de [place].  Cerramos los fines de semana y [employer]. En todo momento tiene acceso a una enfermera para preguntas urgentes. Por favor, llame al nmero principal de la clnica Dept: [phone] y siga las instrucciones.   Para cualquier pregunta que no sea de carcter urgente, tambin puede ponerse en contacto con su proveedor [employer]. Ahora ofrecemos visitas electrnicas para cualquier persona mayor de 18 aos que solicite atencin mdica en lnea para los sntomas que no sean urgentes. Para ms detalles vaya a [email or web address].   Tambin puede bajar la aplicacin de MyChart! Vaya a la tienda  de aplicaciones, busque "MyChart", abra la aplicacin, seleccione [place], e ingrese con su nombre de usuario y la contrasea de Clinical cytogeneticist.

## 2023-06-24 NOTE — Progress Notes (Signed)
Okay to proceed with treatment with ALT 136 today per Dr. Barbaraann Cao.

## 2023-07-08 ENCOUNTER — Inpatient Hospital Stay: Payer: Medicare Other

## 2023-07-08 ENCOUNTER — Inpatient Hospital Stay: Payer: Medicare Other | Admitting: Internal Medicine

## 2023-07-08 VITALS — BP 117/87 | HR 71 | Resp 18

## 2023-07-08 VITALS — BP 124/81 | HR 75 | Temp 98.0°F | Resp 16 | Wt 151.6 lb

## 2023-07-08 DIAGNOSIS — R569 Unspecified convulsions: Secondary | ICD-10-CM

## 2023-07-08 DIAGNOSIS — Z5111 Encounter for antineoplastic chemotherapy: Secondary | ICD-10-CM | POA: Diagnosis not present

## 2023-07-08 DIAGNOSIS — C719 Malignant neoplasm of brain, unspecified: Secondary | ICD-10-CM | POA: Diagnosis not present

## 2023-07-08 LAB — CBC WITH DIFFERENTIAL (CANCER CENTER ONLY)
Abs Immature Granulocytes: 0.01 10*3/uL (ref 0.00–0.07)
Basophils Absolute: 0 10*3/uL (ref 0.0–0.1)
Basophils Relative: 0 %
Eosinophils Absolute: 0.1 10*3/uL (ref 0.0–0.5)
Eosinophils Relative: 1 %
HCT: 39 % (ref 39.0–52.0)
Hemoglobin: 13.4 g/dL (ref 13.0–17.0)
Immature Granulocytes: 0 %
Lymphocytes Relative: 16 %
Lymphs Abs: 0.7 10*3/uL (ref 0.7–4.0)
MCH: 31.1 pg (ref 26.0–34.0)
MCHC: 34.4 g/dL (ref 30.0–36.0)
MCV: 90.5 fL (ref 80.0–100.0)
Monocytes Absolute: 0.5 10*3/uL (ref 0.1–1.0)
Monocytes Relative: 10 %
Neutro Abs: 3.2 10*3/uL (ref 1.7–7.7)
Neutrophils Relative %: 73 %
Platelet Count: 181 10*3/uL (ref 150–400)
RBC: 4.31 MIL/uL (ref 4.22–5.81)
RDW: 13.9 % (ref 11.5–15.5)
WBC Count: 4.4 10*3/uL (ref 4.0–10.5)
nRBC: 0 % (ref 0.0–0.2)

## 2023-07-08 LAB — TOTAL PROTEIN, URINE DIPSTICK: Protein, ur: NEGATIVE mg/dL

## 2023-07-08 MED ORDER — SODIUM CHLORIDE 0.9 % IV SOLN: INTRAVENOUS | Status: DC

## 2023-07-08 MED ORDER — SODIUM CHLORIDE 0.9 % IV SOLN
10.0000 mg/kg | Freq: Once | INTRAVENOUS | Status: AC
  Administered 2023-07-08: 700 mg via INTRAVENOUS
  Filled 2023-07-08: qty 16

## 2023-07-08 NOTE — Patient Instructions (Signed)
Instrucciones al darle de alta: Discharge Instructions Gracias por elegir al [facility] de Cncer de [place] para brindarle atencin mdica de oncologa y Teacher, English as a foreign language.   Si usted tiene una cita de laboratorio con [employer] de [place], por favor vaya directamente al [employer] de Cncer y regstrese en el rea de Engineer, maintenance (IT).   Use ropa cmoda y [place] para tener fcil acceso a las vas del Portacath (acceso venoso de Set designer duracin) o la lnea PICC (catter central colocado por va perifrica).   Nos esforzamos por ofrecerle tiempo de calidad con su proveedor. Es posible que tenga que volver a programar su cita si llega tarde (15 minutos o ms).  El llegar tarde le afecta a usted y a otros pacientes cuyas citas son posteriores a Armed forces operational officer.  Adems, si usted falta a tres o ms citas sin avisar a la oficina, puede ser retirado(a) de la clnica a discrecin del proveedor.      Para las solicitudes de renovacin de recetas, pida a su farmacia que se ponga en contacto con nuestra oficina y deje que transcurran 72 horas para que se complete el proceso de las renovaciones.    Hoy usted recibi los siguientes agentes de quimioterapia e/o inmunoterapia: Bevacizumab      Para ayudar a prevenir las nuseas y los vmitos despus de su tratamiento, le recomendamos que tome su medicamento para las nuseas segn las indicaciones.  LOS SNTOMAS QUE DEBEN COMUNICARSE INMEDIATAMENTE SE INDICAN A CONTINUACIN: *FIEBRE SUPERIOR A 100.4 F (38 C) O MS *ESCALOFROS O SUDORACIN *NUSEAS Y VMITOS QUE NO SE CONTROLAN CON EL MEDICAMENTO PARA LAS NUSEAS *DIFICULTAD INUSUAL PARA RESPIRAR  *MORETONES O HEMORRAGIAS NO HABITUALES *PROBLEMAS URINARIOS (dolor o ardor al Geographical information systems officer o frecuencia para Geographical information systems officer) *PROBLEMAS INTESTINALES (diarrea inusual, estreimiento, dolor cerca del ano) SENSIBILIDAD EN LA BOCA Y EN LA GARGANTA CON O SIN LA PRESENCIA DE LCERAS (dolor de garganta, llagas en la boca o dolor de muelas/dientes) ERUPCIN,  HINCHAZN O DOLORES INUSUALES FLUJO VAGINAL INUSUAL O PICAZN/RASQUIA    Los puntos marcados con un asterisco ( *) indican una posible emergencia y debe hacer un seguimiento tan pronto como le sea posible o vaya al Departamento de Emergencias si se le presenta algn problema.  Por favor, muestre la [place] DE ADVERTENCIA DE Marc Morgans DE ADVERTENCIA DE Gardiner Fanti al registrarse en [address] de Emergencias y a la enfermera de triaje.  Si tiene preguntas despus de su visita o necesita cancelar o volver a programar su cita, por favor pngase en contacto con [facility] - A DEPT OF [facility]  Dept: [phone] y siga las instrucciones. Las horas de oficina son de 8:00 a.m. a 4:30 p.m. de lunes a viernes. Por favor, tenga en cuenta que los mensajes de voz que se dejan despus de las 4:00 p.m. posiblemente no se devolvern hasta el siguiente da de [place].  Cerramos los fines de semana y [employer]. En todo momento tiene acceso a una enfermera para preguntas urgentes. Por favor, llame al nmero principal de la clnica Dept: [phone] y siga las instrucciones.   Para cualquier pregunta que no sea de carcter urgente, tambin puede ponerse en contacto con su proveedor [employer] and [employer]. Ahora ofrecemos visitas electrnicas para cualquier persona mayor de 18 aos que solicite atencin mdica en lnea para los sntomas que no sean urgentes. Para ms detalles vaya a [email or web address].   Tambin puede bajar la aplicacin de MyChart! Vaya a la tienda de  aplicaciones, busque "MyChart", abra la aplicacin, seleccione , e ingrese con su nombre de usuario y la contrasea de Clinical cytogeneticist. Bevacizumab Injection Qu es este medicamento? El BEVACIZUMAB trata algunos tipos de cncer. Acta bloqueando una protena que hace que las clulas cancerosas crezcan y se multipliquen. Esto ayuda a Neurosurgeon propagacin  de las clulas cancerosas. Es un anticuerpo monoclonal. Cleda Clarks medicamento puede ser utilizado para otros usos; si tiene alguna pregunta consulte con su proveedor de atencin mdica o con su farmacutico. MARCAS COMUNES: Alymsys, Avastin, MVASI, Joycelyn Man le debo informar a mi profesional de la salud antes de tomar este medicamento? Necesitan saber si usted presenta alguno de los siguientes problemas o situaciones: Cogulos sanguneos Tos con sangre Ciruga programada o reciente Insuficiencia cardiaca Presin arterial alta Antecedentes de una conexin entre 2 o ms partes del cuerpo que por lo general no estn conectadas (fstula) Antecedentes de un desgarro en el estmago o los intestinos Protena en la orina Una reaccin alrgica o inusual al bevacizumab, a otros medicamentos, alimentos, colorantes o conservantes Si est embarazada o buscando quedar embarazada Si est amamantando a un beb Cmo debo utilizar este medicamento? Este medicamento se inyecta en una vena. Su equipo de atencin lo [employer] en un hospital o en un entorno clnico. Hable con su equipo de atencin sobre el uso de este medicamento en nios. Puede requerir atencin especial. Sobredosis: Pngase en contacto inmediatamente con un centro toxicolgico o una sala de urgencia si usted cree que haya tomado demasiado medicamento.<br>ATENCIN: [employer] es solo para usted. No comparta este medicamento con nadie. Qu sucede si me olvido de una dosis? Cumpla con las citas para dosis de seguimiento. Es importante no olvidar ninguna dosis. Llame a su equipo de atencin si no puede asistir a una cita. Qu puede interactuar con este medicamento? No se anticipan interacciones. Puede ser que esta lista no menciona todas las posibles interacciones. Informe a su profesional de [employer] de [employer] Inc productos a base de hierbas, medicamentos de [place] o suplementos nutritivos que est tomando. Si usted fuma, consume  bebidas alcohlicas o si utiliza drogas ilegales, indqueselo tambin a su profesional de [employer]. Algunas sustancias pueden interactuar con su medicamento. A qu debo estar atento al usar PPL Corporation? Se supervisar su estado de salud atentamente mientras reciba este medicamento. Usted podra necesitar realizarse [employer] Corporation de sangre mientras est usando [place]. Este medicamento podra hacerle sentir un Risk analyst. Esto no es inusual, ya que la quimioterapia puede afectar tanto a las clulas sanas como a las clulas cancerosas. Si presenta algn efecto secundario, infrmelo. Contine con el tratamiento incluso si se siente enfermo, a menos que su equipo de [employer]M Company lo suspenda. Este medicamento podra aumentar el riesgo de moretones o sangrado. Llame a su equipo de atencin si observa sangrados inusuales. Antes de realizarse una ciruga, hable con su equipo de atencin para asegurarse de que no hay ningn problema. Este medicamento puede aumentar el riesgo de que el sitio o la herida quirrgica no sanen correctamente. Tendr que dejar de usar este medicamento durante [address] antes de la [place]. Despus de la [place], espere al menos [address] antes de reiniciar el uso de [place]. Asegrese de que el sitio o la herida quirrgica haya sanado lo suficiente antes de Games developer el uso del medicamento. Hable con su equipo de atencin si tiene alguna pregunta. Hable con su equipo de atencin si podra estar embarazada. Este medicamento puede causar defectos  congnitos graves si se [place] durante el embarazo y por 6 meses despus de la ltima dosis. Se recomienda utilizar un mtodo anticonceptivo mientras est usando este medicamento y por 6 meses despus de la ltima dosis. Su equipo de atencin mdica puede ayudarle a Clinical research associate la opcin que mejor se adapte a sus necesidades. No debe amamantar a un beb mientras [place] este medicamento y por 6 meses despus de la ltima  dosis. Este medicamento puede causar infertilidad. Hable con su equipo de atencin si le preocupa su fertilidad. Qu efectos secundarios puedo tener al [employer] este medicamento? Efectos secundarios que debe informar a su equipo de atencin tan pronto como sea posible: Reacciones alrgicas: erupcin cutnea, comezn/picazn, urticaria, hinchazn de la cara, los labios, la lengua o la garganta Sangrado: heces con [place], o de color negro y Water engineer alquitranado, vomitar sangre o material marrn que tiene el aspecto de posos (residuos) de caf, [place] de color rojo o marrn oscuro, pequeas manchas rojas o moradas en la piel, sangrado o moretones inusuales Cogulo sanguneo: dolor, hinchazn, calor en una pierna, falta de aire, dolor en el pecho Ataque cardiaco: dolor u opresin en el pecho, los hombros, los brazos o la [place], nuseas, falta de [place], piel fra o sudorosa, sensacin de [place] o aturdimiento Insuficiencia cardiaca: falta de aire, hinchazn de los tobillos, los pies o las manos, aumento de peso repentino, debilidad o fatiga inusuales Aumento de la presin arterial Infeccin: fiebre, escalofros, tos, dolor de garganta, heridas que no sanan, dolor o problemas para Geographical information systems officer, sensacin general de molestia o Medical laboratory scientific officer a la infusin: Journalist, newspaper, falta de aire o dificultad para respirar, sensacin de desmayo o aturdimiento [employer] riones: disminucin en la cantidad de orina, hinchazn de los tobillos, las manos o los pies Dolor de estmago intenso que no desaparece o Insurance underwriter Accidente cerebrovascular: entumecimiento o debilidad repentinos de la cara, un brazo o una pierna, dificultad para hablar, confusin, dificultad para caminar, prdida de equilibrio o coordinacin, mareos, dolor de cabeza intenso, cambio en la visin Dolor de cabeza repentino e intenso, confusin, cambio en la visin, convulsiones, que podran ser signos de sndrome de encefalopata posterior  reversible Efectos secundarios que generalmente no requieren atencin mdica (debe informarlos a su equipo de atencin si persisten o si son molestos): Dolor de Cytogeneticist en el sentido del gusto Diarrea Piel seca Aumento de lgrimas Sangrado por la nariz Puede ser que esta lista no menciona todos los posibles efectos secundarios. Comunquese a su mdico por asesoramiento mdico [employer]lett[employer]ard. Usted puede informar los efectos secundarios a la FDA por telfono al 1-800-FDA-1088. Dnde debo guardar mi medicina? Este medicamento se administra en hospitales o clnicas. No se guarda en su casa. <b>ATENCIN: Este folleto es un resumen. Puede ser que no cubra toda la posible informacin. Si usted tiene preguntas acerca de esta medicina, consulte con su mdico, su farmacutico o su profesional de Radiographer, therapeutic.</b>  2024 Elsevier/Gold Standard (2022-06-19 00:00:00)

## 2023-07-08 NOTE — Progress Notes (Signed)
[facility] at [facility] [address]  [place], [place] [address] [phone]   Interval Evaluation  Date of Service: 07/08/23 [name] Patient MRN: [id number] Patient DOB: [birth year] Provider: Henreitta Leber, MD  Identifying Statement:  Hunter Terry is a 62 y.o. male with left temporal glioblastoma   Oncologic History: Oncology History  Glioblastoma, IDH-wildtype (HCC)  06/15/2020 Surgery   Stereotactic biopsy, L temporal Maisie Fus).  Path demonstrates glioblastoma IDH-1 wt   08/02/2020 Surgery   Debulking craniotomy with Dr. Maisie Fus   09/05/2020 - 10/17/2020 Radiation Therapy   IMRT with concurrent Temozolomide [name])   11/19/2020 - 06/18/2021 Chemotherapy   Completes #7 cycles adjuvant 5-day Temozolomide       07/17/2021 Progression   Progression of disease #1   07/18/2021 - 07/10/2022 Chemotherapy   Completes 8 cycles of CCNU 90mg /m2 PO q6 weeks and Avastin 10mg /kg IV q2 weeks   07/11/2022 Progression   Progression of disease #2   09/05/2022 Surgery   Repeat left temporal craniotomy with Dr. Maisie Fus; path is Glioblastoma IDHwt   09/19/2022 -  Chemotherapy   Initiates metronomic Temozolomide, 50mg /m2 PO daily   09/23/2022 - 09/23/2022 Chemotherapy   Patient is on Treatment Plan : BRAIN Low Grade Glioma Grade II, Glioblastoma,  Astrocytoma, Oligodendroma, Recurrent or Progressive / Temozolomide D1-5 Q28 Days     10/27/2022 Progression   Progression of disease #3   11/04/2022 - 06/15/2023 Chemotherapy   Irinotecan and Avastin IV q2-3 weeks   06/16/2023 Progression   Progression of disease #4   06/24/2023 -  Chemotherapy   Patient is on Treatment Plan : Brain Bevacizumab D1, D15 + Carboplatin (4) D1 q28d x 6 cycles       Biomarkers:  MGMT Unknown.  IDH 1/2 Wild type.  EGFR Unknown  TERT "Mutated   Interval History: Hunter Terry presents today for avastin infusion.  No additional seizures following event last  time.  He describes ongoing impaired vision on his right side, stable from prior.  Right arm and leg numbness persists.  Otherwise continues to walk independently, speech has been normal.  Continues on Keppra 750mg  BID, no headaches.  H+P (07/24/20) Patient presented to medical attention in late October, 2021 with new onset seizure.  Event was decribed as loss of consciousness, sudden, without clarity on further details aside from altered mental status upon awakening.  CNS imaging demonsrated non-enhancing mass within left temporal lobe c/w likely glioma; he underwent stereotactic biopsy with Dr. Maisie Fus on 06/15/20.  There was significant delay in finalizing path, explaining delay in follow up and evaluation.  He denies any seizures since discharge from hospital, on 4 anti-seizure drugs.  He does complain of dizziness and drowsiness with the medications, however.  Also describes impaired short term memory.  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    Medications: Current Outpatient Medications on File Prior to Visit  Medication Sig Dispense Refill   acetaminophen (TYLENOL) 500 MG tablet Take 500 mg by mouth every 6 (six) hours as needed for moderate pain.     levETIRAcetam (KEPPRA) 750 MG tablet Take 1 tablet (750 mg total) by mouth 2 (two) times daily. 60 tablet 3   Multiple Vitamin (MULTIVITAMIN WITH MINERALS) TABS tablet Take 1 tablet by mouth daily.     ondansetron (ZOFRAN) 8 MG tablet Take 1 tablet (8 mg total) by mouth every 8 (eight) hours as needed for nausea or vomiting. 30 tablet 1   [DISCONTINUED] lacosamide (  VIMPAT) 200 MG TABS tablet Take 0.5 tablets (100 mg total) by mouth 2 (two) times daily. 30 tablet 0   No current facility-administered medications on file prior to visit.    Allergies: No Known Allergies Past Medical History:  Past Medical History:  Diagnosis Date   Cancer (HCC)    BRAIN TUMOR   Headache    Seizure [facility])    Past Surgical History:  Past Surgical  History:  Procedure Laterality Date   APPLICATION OF CRANIAL NAVIGATION N/A 06/15/2020   Procedure: APPLICATION OF CRANIAL NAVIGATION;  Surgeon: Bedelia Person, MD;  [facility] OR;  Service: Neurosurgery;  Laterality: N/A;   APPLICATION OF CRANIAL NAVIGATION N/A 08/02/2020   Procedure: APPLICATION OF CRANIAL NAVIGATION;  Surgeon: Bedelia Person, MD;  Location: [facility] OR;  Service: Neurosurgery;  Laterality: N/A;   APPLICATION OF CRANIAL NAVIGATION Left 09/05/2022   Procedure: APPLICATION OF CRANIAL NAVIGATION;  Surgeon: Bedelia Person, MD;  Location: [facility] OR;  Service: Neurosurgery;  Laterality: Left;   CRANIOTOMY N/A 08/02/2020   Procedure: CRANIOTOMY LEFT TEMPORAL LOBECTOMY FOR TUMOR EXCISION;  Surgeon: Bedelia Person, MD;  Location[facility] OR;  Service: Neurosurgery;  Laterality: N/A;   CRANIOTOMY Left 09/05/2022   Procedure: CRANIOTOMY FOR RESECTION OF TEMPORAL LOBE TUMOR;  Surgeon: Bedelia Person, MD;  Location: [facility] OR;  Service: Neurosurgery;  Laterality: Left;   FRAMELESS  BIOPSY WITH BRAINLAB Left 06/15/2020   Procedure: Left temporal lobe stereotactic brain biopsy with brainlab;  Surgeon: Bedelia Person, MD;  Location: [facility] OR;  Service: Neurosurgery;  Laterality: Left;   Social History:  Social History   Socioeconomic History   Marital status: Married    Spouse name: Not on file   Number of children: Not on file   Years of education: Not on file   Highest education level: Not on file  Occupational History   Not on file  Tobacco Use   Smoking status: Never   Smokeless tobacco: Never  Vaping Use   Vaping status: Never Used  Substance and Sexual Activity   Alcohol use: Yes    Comment: 1 beer a month   Drug use: Never   Sexual activity: Yes    Partners: Female  Other Topics Concern   Not on file  Social History Narrative   Not on file   Social Determinants of Health   Financial Resource Strain: Not on file  Food Insecurity: Not on file  Transportation  Needs: Not on file  Physical Activity: Not on file  Stress: Not on file  Social Connections: Unknown (12/24/2021)   Received from [facility]   Social Network    Social Network: Not on file  Intimate Partner Violence: Unknown (11/13/2021)   Received from [employer], [facility]   HITS    Physically Hurt: Not on file    Insult or Talk Down To: Not on file    Threaten Physical Harm: Not on file    Scream or Curse: Not on file   Family History:  Family History  Problem Relation Age of Onset   Cancer Neg Hx     Review of Systems: Constitutional: Doesn't report fevers, chills or abnormal weight loss Eyes: Doesn't report blurriness of vision Ears, nose, mouth, throat, and face: Doesn't report sore throat Respiratory: Doesn't report cough, dyspnea or wheezes Cardiovascular: Doesn't report palpitation, chest discomfort  Gastrointestinal:  Doesn't report nausea, constipation, diarrhea GU: Doesn't report incontinence Skin: Doesn't report skin rashes Neurological: Per HPI  Musculoskeletal: Doesn't report joint pain Behavioral/Psych: Doesn't report anxiety  Physical Exam: Vitals:   07/08/23 1402  BP: 124/81  Pulse: 75  Resp: 16  Temp: 98 F (36.7 C)  SpO2: 99%    KPS: 80. General: Alert, cooperative, pleasant, in no acute distress Head: Normal EENT: No conjunctival injection or scleral icterus.  Lungs: Resp effort normal Cardiac: Regular rate Abdomen: Non-distended abdomen Skin: No rashes cyanosis or petechiae. Extremities: No clubbing or edema  Neurologic Exam: Mental Status: Awake, alert, attentive to examiner. Oriented to self and environment. Language has mildly impaired fluency with intact comprehension.  Cranial Nerves: Visual acuity is grossly normal. Right homonymous hemianopia. Extra-ocular movements intact. No ptosis. Face is symmetric Motor: Tone and bulk are normal. Power is full in both arms and legs. Reflexes are symmetric, no pathologic  reflexes present.  Sensory: Intact to light touch Gait: Normal.   Labs: I have reviewed the data as listed    Component Value Date/Time   NA 135 06/24/2023 0841   K 4.0 06/24/2023 0841   CL 98 06/24/2023 0841   CO2 29 06/24/2023 0841   GLUCOSE 180 (H) 06/24/2023 0841   BUN 20 06/24/2023 0841   CREATININE 0.81 06/24/2023 0841   CALCIUM 9.7 06/24/2023 0841   PROT 7.3 06/24/2023 0841   ALBUMIN 4.3 06/24/2023 0841   AST 66 (H) 06/24/2023 0841   ALT 136 (H) 06/24/2023 0841   ALKPHOS 220 (H) 06/24/2023 0841   BILITOT 0.5 06/24/2023 0841   GFRNONAA >60 06/24/2023 0841   Lab Results  Component Value Date   WBC 4.4 07/08/2023   NEUTROABS 3.2 07/08/2023   HGB 13.4 07/08/2023   HCT 39.0 07/08/2023   MCV 90.5 07/08/2023   PLT 181 07/08/2023     Assessment/Plan Glioblastoma, IDH-wildtype (HCC)  Focal seizures (HCC)  Hunter Terry presents today for cycle 1, day 15 carboplatin+avastin.  Avastin only infusion today.  Recommended with Carboplatin AUC 4 IV q4 weeks, with concurrent Avastin 10mg /kg IV q2 weeks.  We reviewed side effects, including nausea, fatigue, cytopenias.  Avastin has been previously well tolerated.   Chemotherapy should be held for the following:  ANC less than 1,000  Platelets less than 100,000  LFT or creatinine greater than 2x ULN  If clinical concerns/contraindications develop  Avastin should be held for the following:  ANC less than 500  Platelets less than 50,000  LFT or creatinine greater than 2x ULN  If clinical concerns/contraindications develop  Keppra will stay at 750mg  BID for now.  We appreciate the opportunity to participate in the care of Hunter Terry.     We ask that Hunter Terry return in 2 weeks for cycle #2 carboplatin+avastin.   All questions were answered. The patient knows to call the clinic with any problems, questions or concerns. No barriers to learning were detected.  The total time spent in the encounter was  30 minutes and more than 50% was on counseling and review of test results   Henreitta Leber, MD Medical Director of Neuro-Oncology [facility] 07/08/23 2:12 PM

## 2023-07-09 ENCOUNTER — Other Ambulatory Visit: Payer: Self-pay

## 2023-07-21 MED FILL — Fosaprepitant Dimeglumine For IV Infusion 150 MG (Base Eq): INTRAVENOUS | Qty: 5 | Status: AC

## 2023-07-22 ENCOUNTER — Other Ambulatory Visit: Payer: Medicare Other

## 2023-07-22 ENCOUNTER — Inpatient Hospital Stay: Payer: Medicare Other

## 2023-07-22 ENCOUNTER — Ambulatory Visit: Payer: Medicare Other

## 2023-07-22 ENCOUNTER — Inpatient Hospital Stay: Payer: Medicare Other | Attending: Internal Medicine | Admitting: Internal Medicine

## 2023-07-22 VITALS — BP 113/67 | HR 77 | Temp 97.9°F | Resp 16

## 2023-07-22 VITALS — BP 121/78 | HR 79 | Temp 97.7°F | Resp 20 | Wt 154.5 lb

## 2023-07-22 DIAGNOSIS — Z9221 Personal history of antineoplastic chemotherapy: Secondary | ICD-10-CM | POA: Diagnosis not present

## 2023-07-22 DIAGNOSIS — C719 Malignant neoplasm of brain, unspecified: Secondary | ICD-10-CM

## 2023-07-22 DIAGNOSIS — R569 Unspecified convulsions: Secondary | ICD-10-CM | POA: Diagnosis not present

## 2023-07-22 DIAGNOSIS — Z923 Personal history of irradiation: Secondary | ICD-10-CM | POA: Diagnosis not present

## 2023-07-22 DIAGNOSIS — Z79899 Other long term (current) drug therapy: Secondary | ICD-10-CM | POA: Insufficient documentation

## 2023-07-22 DIAGNOSIS — C712 Malignant neoplasm of temporal lobe: Secondary | ICD-10-CM | POA: Insufficient documentation

## 2023-07-22 DIAGNOSIS — Z5111 Encounter for antineoplastic chemotherapy: Secondary | ICD-10-CM | POA: Diagnosis present

## 2023-07-22 LAB — CBC WITH DIFFERENTIAL (CANCER CENTER ONLY)
Abs Immature Granulocytes: 0.01 10*3/uL (ref 0.00–0.07)
Basophils Absolute: 0 10*3/uL (ref 0.0–0.1)
Basophils Relative: 0 %
Eosinophils Absolute: 0.1 10*3/uL (ref 0.0–0.5)
Eosinophils Relative: 1 %
HCT: 40.7 % (ref 39.0–52.0)
Hemoglobin: 14.3 g/dL (ref 13.0–17.0)
Immature Granulocytes: 0 %
Lymphocytes Relative: 28 %
Lymphs Abs: 1 10*3/uL (ref 0.7–4.0)
MCH: 31.6 pg (ref 26.0–34.0)
MCHC: 35.1 g/dL (ref 30.0–36.0)
MCV: 90 fL (ref 80.0–100.0)
Monocytes Absolute: 0.6 10*3/uL (ref 0.1–1.0)
Monocytes Relative: 17 %
Neutro Abs: 1.9 10*3/uL (ref 1.7–7.7)
Neutrophils Relative %: 54 %
Platelet Count: 220 10*3/uL (ref 150–400)
RBC: 4.52 MIL/uL (ref 4.22–5.81)
RDW: 13.7 % (ref 11.5–15.5)
WBC Count: 3.6 10*3/uL — ABNORMAL LOW (ref 4.0–10.5)
nRBC: 0 % (ref 0.0–0.2)

## 2023-07-22 LAB — CMP (CANCER CENTER ONLY)
ALT: 42 U/L (ref 0–44)
AST: 34 U/L (ref 15–41)
Albumin: 4.1 g/dL (ref 3.5–5.0)
Alkaline Phosphatase: 173 U/L — ABNORMAL HIGH (ref 38–126)
Anion gap: 7 (ref 5–15)
BUN: 13 mg/dL (ref 8–23)
CO2: 29 mmol/L (ref 22–32)
Calcium: 9.5 mg/dL (ref 8.9–10.3)
Chloride: 101 mmol/L (ref 98–111)
Creatinine: 0.75 mg/dL (ref 0.61–1.24)
GFR, Estimated: >60 mL/min (ref >60)
Glucose, Bld: 98 mg/dL (ref 70–99)
Potassium: 4.4 mmol/L (ref 3.5–5.1)
Sodium: 137 mmol/L (ref 135–145)
Total Bilirubin: 0.4 mg/dL (ref <1.2)
Total Protein: 6.9 g/dL (ref 6.5–8.1)

## 2023-07-22 MED ORDER — OMEPRAZOLE 20 MG PO CPDR: 20.0000 mg | DELAYED_RELEASE_CAPSULE | Freq: Every day | ORAL | 1 refills | Status: DC

## 2023-07-22 MED ORDER — PALONOSETRON HCL INJECTION 0.25 MG/5ML
0.2500 mg | Freq: Once | INTRAVENOUS | Status: AC
  Administered 2023-07-22: 0.25 mg via INTRAVENOUS
  Filled 2023-07-22: qty 5

## 2023-07-22 MED ORDER — SODIUM CHLORIDE 0.9 % IV SOLN
150.0000 mg | Freq: Once | INTRAVENOUS | Status: AC
  Administered 2023-07-22: 150 mg via INTRAVENOUS
  Filled 2023-07-22: qty 150

## 2023-07-22 MED ORDER — LEVETIRACETAM 750 MG PO TABS: 750.0000 mg | ORAL_TABLET | Freq: Two times a day (BID) | ORAL | 3 refills | Status: DC

## 2023-07-22 MED ORDER — SODIUM CHLORIDE 0.9 % IV SOLN
INTRAVENOUS | Status: DC
Start: 2023-07-22 — End: 2023-07-22

## 2023-07-22 MED ORDER — DEXAMETHASONE SODIUM PHOSPHATE 10 MG/ML IJ SOLN
10.0000 mg | Freq: Once | INTRAMUSCULAR | Status: AC
  Administered 2023-07-22: 10 mg via INTRAVENOUS
  Filled 2023-07-22: qty 1

## 2023-07-22 MED ORDER — SODIUM CHLORIDE 0.9 % IV SOLN
477.2000 mg | Freq: Once | INTRAVENOUS | Status: AC
  Administered 2023-07-22: 480 mg via INTRAVENOUS
  Filled 2023-07-22: qty 48

## 2023-07-22 MED ORDER — SODIUM CHLORIDE 0.9 % IV SOLN
10.0000 mg/kg | Freq: Once | INTRAVENOUS | Status: AC
  Administered 2023-07-22: 700 mg via INTRAVENOUS
  Filled 2023-07-22: qty 16

## 2023-07-22 NOTE — Patient Instructions (Signed)
Instrucciones al darle de alta: Discharge Instructions Gracias por elegir al [facility] de [facility] para [place] atencin mdica de oncologa y Teacher, English as a foreign language.   Si usted tiene una cita de laboratorio con [employer] de [place], por favor vaya directamente al [employer] de Cncer y regstrese en el rea de Engineer, maintenance (IT).   Use ropa cmoda y [place] para tener fcil acceso a las vas del Portacath (acceso venoso de Set designer duracin) o la lnea PICC (catter central colocado por va perifrica).   Nos esforzamos por ofrecerle tiempo de calidad con su proveedor. Es posible que tenga que volver a programar su cita si llega tarde (15 minutos o ms).  El llegar tarde le afecta a usted y a otros pacientes cuyas citas son posteriores a Armed forces operational officer.  Adems, si usted falta a tres o ms citas sin avisar a la oficina, puede ser retirado(a) de la clnica a discrecin del proveedor.      Para las solicitudes de renovacin de recetas, pida a su farmacia que se ponga en contacto con nuestra oficina y deje que transcurran 72 horas para que se complete el proceso de las renovaciones.    Hoy usted recibi los siguientes agentes de quimioterapia e/o inmunoterapia: Nurse, mental health and Carboplatin      Para ayudar a prevenir las nuseas y los vmitos despus de su tratamiento, le recomendamos que tome su medicamento para las nuseas segn las indicaciones.  LOS SNTOMAS QUE DEBEN COMUNICARSE INMEDIATAMENTE SE INDICAN A CONTINUACIN: *FIEBRE SUPERIOR A 100.4 F (38 C) O MS *ESCALOFROS O SUDORACIN *NUSEAS Y VMITOS QUE NO SE CONTROLAN CON EL MEDICAMENTO PARA LAS NUSEAS *DIFICULTAD INUSUAL PARA RESPIRAR  *MORETONES O HEMORRAGIAS NO HABITUALES *PROBLEMAS URINARIOS (dolor o ardor al Geographical information systems officer o frecuencia para Geographical information systems officer) *PROBLEMAS INTESTINALES (diarrea inusual, estreimiento, dolor cerca del ano) SENSIBILIDAD EN LA BOCA Y EN LA GARGANTA CON O SIN LA PRESENCIA DE LCERAS (dolor de garganta, llagas en la boca o dolor de  muelas/dientes) ERUPCIN, HINCHAZN O DOLORES INUSUALES FLUJO VAGINAL INUSUAL O PICAZN/RASQUIA    Los puntos marcados con un asterisco ( *) indican una posible emergencia y debe hacer un seguimiento tan pronto como le sea posible o vaya al Departamento de Emergencias si se le presenta algn problema.  Por favor, muestre la [place] DE ADVERTENCIA DE Marc Morgans DE ADVERTENCIA DE [place] Fanti al registrarse en [address] de Emergencias y a la enfermera de triaje.  Si tiene preguntas despus de su visita o necesita cancelar o volver a programar su cita, por favor pngase en contacto con [facility] - A DEPT OF [facility]  Dept: [phone] y siga las instrucciones. Las horas de oficina son de 8:00 a.m. a 4:30 p.m. de lunes a viernes. Por favor, tenga en cuenta que los mensajes de voz que se dejan despus de las 4:00 p.m. posiblemente no se devolvern hasta el siguiente da de [place].  Cerramos los fines de semana y [employer]. En todo momento tiene acceso a una enfermera para preguntas urgentes. Por favor, llame al nmero principal de la clnica Dept: [phone] y siga las instrucciones.   Para cualquier pregunta que no sea de carcter urgente, tambin puede ponerse en contacto con su proveedor [employer]. Ahora ofrecemos visitas electrnicas para cualquier persona mayor de 18 aos que solicite atencin mdica en lnea para los sntomas que no sean urgentes. Para ms detalles vaya a [email or web address].   Tambin puede bajar la aplicacin de MyChart! Laurena Bering  a la tienda de aplicaciones, busque "MyChart", abra la aplicacin, seleccione [place], e ingrese con su nombre de usuario y la contrasea de Clinical cytogeneticist.

## 2023-07-22 NOTE — Progress Notes (Signed)
[facility] [address]  [place], [place] [address] [phone]   Interval Evaluation  Date of Service: 07/22/23 [name] Patient MRN: [id number] Patient DOB: 09-Aug-1961 Provider: Henreitta Leber, MD  Identifying Statement:  Hunter Terry is a 62 y.o. male with left temporal glioblastoma   Oncologic History: Oncology History  Glioblastoma, IDH-wildtype (HCC)  06/15/2020 Surgery   Stereotactic biopsy, L temporal Hunter Terry).  Path demonstrates glioblastoma IDH-1 wt   08/02/2020 Surgery   Debulking craniotomy with Dr. [name]   09/05/2020 - 10/17/2020 Radiation Therapy   IMRT with concurrent Temozolomide Hunter Terry)   11/19/2020 - 06/18/2021 Chemotherapy   Completes #7 cycles adjuvant 5-day Temozolomide       07/17/2021 Progression   Progression of disease #1   07/18/2021 - 07/10/2022 Chemotherapy   Completes 8 cycles of CCNU 90mg /m2 PO q6 weeks and Avastin 10mg /kg IV q2 weeks   07/11/2022 Progression   Progression of disease #2   09/05/2022 Surgery   Repeat left temporal craniotomy with Dr. [name]; path is Glioblastoma IDHwt   09/19/2022 -  Chemotherapy   Initiates metronomic Temozolomide, 50mg /m2 PO daily   09/23/2022 - 09/23/2022 Chemotherapy   Patient is on Treatment Plan : BRAIN Low Grade Glioma Grade II, Glioblastoma,  Astrocytoma, Oligodendroma, Recurrent or Progressive / Temozolomide D1-5 Q28 Days     10/27/2022 Progression   Progression of disease #3   11/04/2022 - 06/15/2023 Chemotherapy   Irinotecan and Avastin IV q2-3 weeks   06/16/2023 Progression   Progression of disease #4   06/24/2023 -  Chemotherapy   Patient is on Treatment Plan : Brain Bevacizumab D1, D15 + Carboplatin (4) D1 q28d x 6 cycles       Biomarkers:  MGMT Unknown.  IDH 1/2 Wild type.  EGFR Unknown  TERT "Mutated   Interval History: Hunter Terry presents today for cycle #2 carboplatin and avastin.  Has been experiencing reflux  symptoms in the days following avastin infusions.  No additional seizures.  He describes ongoing impaired vision on his right side, stable from prior.  Right arm and leg numbness persists.  Otherwise continues to walk independently, speech has been normal.  Continues on Keppra 750mg  BID, no headaches.  H+P (07/24/20) Patient presented to medical attention in late October, 2021 with new onset seizure.  Event was decribed as loss of consciousness, sudden, without clarity on further details aside from altered mental status upon awakening.  CNS imaging demonsrated non-enhancing mass within left temporal lobe c/w likely glioma; he underwent stereotactic biopsy with Dr. [name] on 06/15/20.  There was significant delay in finalizing path, explaining delay in follow up and evaluation.  He denies any seizures since discharge from hospital, on 4 anti-seizure drugs.  He does complain of dizziness and drowsiness with the medications, however.  Also describes impaired short term memory.  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    Medications: Current Outpatient Medications on File Prior to Visit  Medication Sig Dispense Refill   acetaminophen (TYLENOL) 500 MG tablet Take 500 mg by mouth every 6 (six) hours as needed for moderate pain.     levETIRAcetam (KEPPRA) 750 MG tablet Take 1 tablet (750 mg total) by mouth 2 (two) times daily. 60 tablet 3   Multiple Vitamin (MULTIVITAMIN WITH MINERALS) TABS tablet Take 1 tablet by mouth daily.     ondansetron (ZOFRAN) 8 MG tablet Take 1 tablet (8 mg total) by mouth every 8 (eight) hours as needed  for nausea or vomiting. 30 tablet 1   [DISCONTINUED] lacosamide (VIMPAT) 200 MG TABS tablet Take 0.5 tablets (100 mg total) by mouth 2 (two) times daily. 30 tablet 0   No current facility-administered medications on file prior to visit.    Allergies: No Known Allergies Past Medical History:  Past Medical History:  Diagnosis Date   Cancer (HCC)    BRAIN TUMOR    Headache    Seizure [facility])    Past Surgical History:  Past Surgical History:  Procedure Laterality Date   APPLICATION OF CRANIAL NAVIGATION N/A 06/15/2020   Procedure: APPLICATION OF CRANIAL NAVIGATION;  Surgeon: Bedelia Person, MD;  Location[facility] OR;  Service: Neurosurgery;  Laterality: N/A;   APPLICATION OF CRANIAL NAVIGATION N/A 08/02/2020   Procedure: APPLICATION OF CRANIAL NAVIGATION;  Surgeon: Bedelia Person, MD;  Location: [facility] OR;  Service: Neurosurgery;  Laterality: N/A;   APPLICATION OF CRANIAL NAVIGATION Left 09/05/2022   Procedure: APPLICATION OF CRANIAL NAVIGATION;  Surgeon: Bedelia Person, MD;  Location: [facility] OR;  Service: Neurosurgery;  Laterality: Left;   CRANIOTOMY N/A 08/02/2020   Procedure: CRANIOTOMY LEFT TEMPORAL LOBECTOMY FOR TUMOR EXCISION;  Surgeon: Bedelia Person, MD;  Location[facility] OR;  Service: Neurosurgery;  Laterality: N/A;   CRANIOTOMY Left 09/05/2022   Procedure: CRANIOTOMY FOR RESECTION OF TEMPORAL LOBE TUMOR;  Surgeon: Bedelia Person, MD;  Location[facility] OR;  Service: Neurosurgery;  Laterality: Left;   FRAMELESS  BIOPSY WITH BRAINLAB Left 06/15/2020   Procedure: Left temporal lobe stereotactic brain biopsy with brainlab;  Surgeon: Bedelia Person, MD;  Location: [facility] OR;  Service: Neurosurgery;  Laterality: Left;   Social History:  Social History   Socioeconomic History   Marital status: Married    Spouse name: Not on file   Number of children: Not on file   Years of education: Not on file   Highest education level: Not on file  Occupational History   Not on file  Tobacco Use   Smoking status: Never   Smokeless tobacco: Never  Vaping Use   Vaping status: Never Used  Substance and Sexual Activity   Alcohol use: Yes    Comment: 1 beer a month   Drug use: Never   Sexual activity: Yes    Partners: Female  Other Topics Concern   Not on file  Social History Narrative   Not on file   Social Determinants of Health   Financial Resource  Strain: Not on file  Food Insecurity: Not on file  Transportation Needs: Not on file  Physical Activity: Not on file  Stress: Not on file  Social Connections: Unknown (12/24/2021)   [facility]   Social Network    Social Network: Not on file  Intimate Partner Violence: Unknown (11/13/2021)   Received from [employer], [facility]   HITS    Physically Hurt: Not on file    Insult or Talk Down To: Not on file    Threaten Physical Harm: Not on file    Scream or Curse: Not on file   Family History:  Family History  Problem Relation Age of Onset   Cancer Neg Hx     Review of Systems: Constitutional: Doesn't report fevers, chills or abnormal weight loss Eyes: Doesn't report blurriness of vision Ears, nose, mouth, throat, and face: Doesn't report sore throat Respiratory: Doesn't report cough, dyspnea or wheezes Cardiovascular: Doesn't report palpitation, chest discomfort  Gastrointestinal:  Doesn't report nausea, constipation, diarrhea GU:  Doesn't report incontinence Skin: Doesn't report skin rashes Neurological: Per HPI Musculoskeletal: Doesn't report joint pain Behavioral/Psych: Doesn't report anxiety  Physical Exam: Vitals:   07/22/23 1448  BP: 121/78  Pulse: 79  Resp: 20  Temp: 97.7 F (36.5 C)  SpO2: 100%    KPS: 80. General: Alert, cooperative, pleasant, in no acute distress Head: Normal EENT: No conjunctival injection or scleral icterus.  Lungs: Resp effort normal Cardiac: Regular rate Abdomen: Non-distended abdomen Skin: No rashes cyanosis or petechiae. Extremities: No clubbing or edema  Neurologic Exam: Mental Status: Awake, alert, attentive to examiner. Oriented to self and environment. Language has mildly impaired fluency with intact comprehension.  Cranial Nerves: Visual acuity is grossly normal. Right homonymous hemianopia. Extra-ocular movements intact. No ptosis. Face is symmetric Motor: Tone and bulk are normal. Power  is full in both arms and legs. Reflexes are symmetric, no pathologic reflexes present.  Sensory: Intact to light touch Gait: Normal.   Labs: I have reviewed the data as listed    Component Value Date/Time   NA 137 07/22/2023 1428   K 4.4 07/22/2023 1428   CL 101 07/22/2023 1428   CO2 29 07/22/2023 1428   GLUCOSE 98 07/22/2023 1428   BUN 13 07/22/2023 1428   CREATININE 0.75 07/22/2023 1428   CALCIUM 9.5 07/22/2023 1428   PROT 6.9 07/22/2023 1428   ALBUMIN 4.1 07/22/2023 1428   AST 34 07/22/2023 1428   ALT 42 07/22/2023 1428   ALKPHOS 173 (H) 07/22/2023 1428   BILITOT 0.4 07/22/2023 1428   GFRNONAA >60 07/22/2023 1428   Lab Results  Component Value Date   WBC 3.6 (L) 07/22/2023   NEUTROABS 1.9 07/22/2023   HGB 14.3 07/22/2023   HCT 40.7 07/22/2023   MCV 90.0 07/22/2023   PLT 220 07/22/2023     Assessment/Plan Glioblastoma, IDH-wildtype (HCC)  Focal seizures (HCC)  Hunter Terry presents today for cycle 2, day 1 carboplatin+avastin.  Labs are within normal limits.  Recommended proceeding with Carboplatin AUC 4 IV q4 weeks, with concurrent Avastin 10mg /kg IV q2 weeks.  We reviewed side effects, including nausea, fatigue, cytopenias.  Avastin has been previously well tolerated.   Chemotherapy should be held for the following:  ANC less than 1,000  Platelets less than 100,000  LFT or creatinine greater than 2x ULN  If clinical concerns/contraindications develop  Avastin should be held for the following:  ANC less than 500  Platelets less than 50,000  LFT or creatinine greater than 2x ULN  If clinical concerns/contraindications develop  Keppra will stay at 750mg  BID for now.  Ok with prilosec 20mg  daily for reflux symptoms.  We appreciate the opportunity to participate in the care of Hunter Terry.     We ask that [employer] return in 2 weeks for cycle #2, day 15 carboplatin+avastin.  MRI ordered for 08/15/23  All questions were answered. The  patient knows to call the clinic with any problems, questions or concerns. No barriers to learning were detected.  The total time spent in the encounter was 30 minutes and more than 50% was on counseling and review of test results   Hunter Leber, MD Medical Director of Neuro-Oncology [facility] 07/22/23 3:23 PM

## 2023-08-05 ENCOUNTER — Inpatient Hospital Stay: Payer: Medicare Other

## 2023-08-05 ENCOUNTER — Encounter: Payer: Self-pay | Admitting: Internal Medicine

## 2023-08-05 ENCOUNTER — Inpatient Hospital Stay (HOSPITAL_BASED_OUTPATIENT_CLINIC_OR_DEPARTMENT_OTHER): Payer: Medicare Other | Admitting: Internal Medicine

## 2023-08-05 VITALS — BP 131/81 | HR 69 | Temp 97.5°F | Resp 16 | Wt 157.8 lb

## 2023-08-05 DIAGNOSIS — R569 Unspecified convulsions: Secondary | ICD-10-CM

## 2023-08-05 DIAGNOSIS — C719 Malignant neoplasm of brain, unspecified: Secondary | ICD-10-CM | POA: Diagnosis not present

## 2023-08-05 DIAGNOSIS — Z5111 Encounter for antineoplastic chemotherapy: Secondary | ICD-10-CM | POA: Diagnosis not present

## 2023-08-05 LAB — CBC WITH DIFFERENTIAL (CANCER CENTER ONLY)
Abs Immature Granulocytes: 0.01 10*3/uL (ref 0.00–0.07)
Basophils Absolute: 0 10*3/uL (ref 0.0–0.1)
Basophils Relative: 0 %
Eosinophils Absolute: 0 10*3/uL (ref 0.0–0.5)
Eosinophils Relative: 1 %
HCT: 38.8 % — ABNORMAL LOW (ref 39.0–52.0)
Hemoglobin: 13.8 g/dL (ref 13.0–17.0)
Immature Granulocytes: 0 %
Lymphocytes Relative: 21 %
Lymphs Abs: 0.9 10*3/uL (ref 0.7–4.0)
MCH: 30.7 pg (ref 26.0–34.0)
MCHC: 35.6 g/dL (ref 30.0–36.0)
MCV: 86.4 fL (ref 80.0–100.0)
Monocytes Absolute: 0.5 10*3/uL (ref 0.1–1.0)
Monocytes Relative: 13 %
Neutro Abs: 2.7 10*3/uL (ref 1.7–7.7)
Neutrophils Relative %: 65 %
Platelet Count: 148 10*3/uL — ABNORMAL LOW (ref 150–400)
RBC: 4.49 MIL/uL (ref 4.22–5.81)
RDW: 13.8 % (ref 11.5–15.5)
WBC Count: 4.2 10*3/uL (ref 4.0–10.5)
nRBC: 0 % (ref 0.0–0.2)

## 2023-08-05 NOTE — Progress Notes (Signed)
[facility] [address]  [place], [place] [address] [phone]   Interval Evaluation  Date of Service: 08/05/23 [name] Patient MRN: [id number] Patient DOB: [birth year] Provider: Henreitta Leber, MD  Identifying Statement:  Hunter Terry is a 62 y.o. male with left temporal glioblastoma   Oncologic History: Oncology History  Glioblastoma, IDH-wildtype (HCC)  06/15/2020 Surgery   Stereotactic biopsy, L temporal Maisie Fus).  Path demonstrates glioblastoma IDH-1 wt   08/02/2020 Surgery   Debulking craniotomy with Dr. Maisie Fus   09/05/2020 - 10/17/2020 Radiation Therapy   IMRT with concurrent Temozolomide [name])   11/19/2020 - 06/18/2021 Chemotherapy   Completes #7 cycles adjuvant 5-day Temozolomide       07/17/2021 Progression   Progression of disease #1   07/18/2021 - 07/10/2022 Chemotherapy   Completes 8 cycles of CCNU 90mg /m2 PO q6 weeks and Avastin 10mg /kg IV q2 weeks   07/11/2022 Progression   Progression of disease #2   09/05/2022 Surgery   Repeat left temporal craniotomy with Dr. Maisie Fus; path is Glioblastoma IDHwt   09/19/2022 -  Chemotherapy   Initiates metronomic Temozolomide, 50mg /m2 PO daily   09/23/2022 - 09/23/2022 Chemotherapy   Patient is on Treatment Plan : BRAIN Low Grade Glioma Grade II, Glioblastoma,  Astrocytoma, Oligodendroma, Recurrent or Progressive / Temozolomide D1-5 Q28 Days     10/27/2022 Progression   Progression of disease #3   11/04/2022 - 06/15/2023 Chemotherapy   Irinotecan and Avastin IV q2-3 weeks   06/16/2023 Progression   Progression of disease #4   06/24/2023 -  Chemotherapy   Patient is on Treatment Plan : Brain Bevacizumab D1, D15 + Carboplatin (4) D1 q28d x 6 cycles       Biomarkers:  MGMT Unknown.  IDH 1/2 Wild type.  EGFR Unknown  TERT "Mutated   Interval History: Hunter Terry presents today for cycle #2, day 15 of carboplatin and avastin.  Reflux symptoms  have improved somewhat since starting the prilosec.  No additional seizures.  He describes ongoing impaired vision on his right side, stable from prior.  Right arm and leg numbness persists.  Otherwise continues to walk independently, speech has been normal.  Continues on Keppra 750mg  BID, no headaches.  H+P (07/24/20) Patient presented to medical attention in late October, 2021 with new onset seizure.  Event was decribed as loss of consciousness, sudden, without clarity on further details aside from altered mental status upon awakening.  CNS imaging demonsrated non-enhancing mass within left temporal lobe c/w likely glioma; he underwent stereotactic biopsy with Dr. Maisie Fus on 06/15/20.  There was significant delay in finalizing path, explaining delay in follow up and evaluation.  He denies any seizures since discharge from hospital, on 4 anti-seizure drugs.  He does complain of dizziness and drowsiness with the medications, however.  Also describes impaired short term memory.  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    Medications: Current Outpatient Medications on File Prior to Visit  Medication Sig Dispense Refill   acetaminophen (TYLENOL) 500 MG tablet Take 500 mg by mouth every 6 (six) hours as needed for moderate pain.     levETIRAcetam (KEPPRA) 750 MG tablet Take 1 tablet (750 mg total) by mouth 2 (two) times daily. 60 tablet 3   Multiple Vitamin (MULTIVITAMIN WITH MINERALS) TABS tablet Take 1 tablet by mouth daily.     omeprazole (PRILOSEC) 20 MG capsule Take 1 capsule (20 mg total) by mouth daily. 30 capsule 1  ondansetron (ZOFRAN) 8 MG tablet Take 1 tablet (8 mg total) by mouth every 8 (eight) hours as needed for nausea or vomiting. 30 tablet 1   [DISCONTINUED] lacosamide (VIMPAT) 200 MG TABS tablet Take 0.5 tablets (100 mg total) by mouth 2 (two) times daily. 30 tablet 0   No current facility-administered medications on file prior to visit.    Allergies: No Known Allergies Past  Medical History:  Past Medical History:  Diagnosis Date   Cancer (HCC)    BRAIN TUMOR   Headache    Seizure [facility])    Past Surgical History:  Past Surgical History:  Procedure Laterality Date   APPLICATION OF CRANIAL NAVIGATION N/A 06/15/2020   Procedure: APPLICATION OF CRANIAL NAVIGATION;  Surgeon: Bedelia Person, MD;  Location: [facility] OR;  Service: Neurosurgery;  Laterality: N/A;   APPLICATION OF CRANIAL NAVIGATION N/A 08/02/2020   Procedure: APPLICATION OF CRANIAL NAVIGATION;  Surgeon: Bedelia Person, MD;  Location: [facility] OR;  Service: Neurosurgery;  Laterality: N/A;   APPLICATION OF CRANIAL NAVIGATION Left 09/05/2022   Procedure: APPLICATION OF CRANIAL NAVIGATION;  Surgeon: Bedelia Person, MD;  Location: [facility] OR;  Service: Neurosurgery;  Laterality: Left;   CRANIOTOMY N/A 08/02/2020   Procedure: CRANIOTOMY LEFT TEMPORAL LOBECTOMY FOR TUMOR EXCISION;  Surgeon: Bedelia Person, MD;  Location: [facility] OR;  Service: Neurosurgery;  Laterality: N/A;   CRANIOTOMY Left 09/05/2022   Procedure: CRANIOTOMY FOR RESECTION OF TEMPORAL LOBE TUMOR;  Surgeon: Bedelia Person, MD;  Location: [facility] OR;  Service: Neurosurgery;  Laterality: Left;   FRAMELESS  BIOPSY WITH BRAINLAB Left 06/15/2020   Procedure: Left temporal lobe stereotactic brain biopsy with brainlab;  Surgeon: Bedelia Person, MD;  Location: [facility] OR;  Service: Neurosurgery;  Laterality: Left;   Social History:  Social History   Socioeconomic History   Marital status: Married    Spouse name: Not on file   Number of children: Not on file   Years of education: Not on file   Highest education level: Not on file  Occupational History   Not on file  Tobacco Use   Smoking status: Never   Smokeless tobacco: Never  Vaping Use   Vaping status: Never Used  Substance and Sexual Activity   Alcohol use: Yes    Comment: 1 beer a month   Drug use: Never   Sexual activity: Yes    Partners: Female  Other Topics Concern   Not on file   Social History Narrative   Not on file   Social Drivers of Health   Financial Resource Strain: Not on file  Food Insecurity: Not on file  Transportation Needs: Not on file  Physical Activity: Not on file  Stress: Not on file  Social Connections: Unknown (12/24/2021)   Received from [facility]   Social Network    Social Network: Not on file  Intimate Partner Violence: Unknown (11/13/2021)   [facility], [facility]   HITS    Physically Hurt: Not on file    Insult or Talk Down To: Not on file    Threaten Physical Harm: Not on file    Scream or Curse: Not on file   Family History:  Family History  Problem Relation Age of Onset   Cancer Neg Hx     Review of Systems: Constitutional: Doesn't report fevers, chills or abnormal weight loss Eyes: Doesn't report blurriness of vision Ears, nose, mouth, throat, and face: Doesn't report sore throat Respiratory: Doesn't report  cough, dyspnea or wheezes Cardiovascular: Doesn't report palpitation, chest discomfort  Gastrointestinal:  Doesn't report nausea, constipation, diarrhea GU: Doesn't report incontinence Skin: Doesn't report skin rashes Neurological: Per HPI Musculoskeletal: Doesn't report joint pain Behavioral/Psych: Doesn't report anxiety  Physical Exam: Vitals:   08/05/23 1010  BP: 131/81  Pulse: 69  Resp: 16  Temp: (!) 97.5 F (36.4 C)  SpO2: 99%    KPS: 80. General: Alert, cooperative, pleasant, in no acute distress Head: Normal EENT: No conjunctival injection or scleral icterus.  Lungs: Resp effort normal Cardiac: Regular rate Abdomen: Non-distended abdomen Skin: No rashes cyanosis or petechiae. Extremities: No clubbing or edema  Neurologic Exam: Mental Status: Awake, alert, attentive to examiner. Oriented to self and environment. Language has mildly impaired fluency with intact comprehension.  Cranial Nerves: Visual acuity is grossly normal. Right homonymous hemianopia.  Extra-ocular movements intact. No ptosis. Face is symmetric Motor: Tone and bulk are normal. Power is full in both arms and legs. Reflexes are symmetric, no pathologic reflexes present.  Sensory: Intact to light touch Gait: Normal.   Labs: I have reviewed the data as listed    Component Value Date/Time   NA 137 07/22/2023 1428   K 4.4 07/22/2023 1428   CL 101 07/22/2023 1428   CO2 29 07/22/2023 1428   GLUCOSE 98 07/22/2023 1428   BUN 13 07/22/2023 1428   CREATININE 0.75 07/22/2023 1428   CALCIUM 9.5 07/22/2023 1428   PROT 6.9 07/22/2023 1428   ALBUMIN 4.1 07/22/2023 1428   AST 34 07/22/2023 1428   ALT 42 07/22/2023 1428   ALKPHOS 173 (H) 07/22/2023 1428   BILITOT 0.4 07/22/2023 1428   GFRNONAA >60 07/22/2023 1428   Lab Results  Component Value Date   WBC 3.6 (L) 07/22/2023   NEUTROABS 1.9 07/22/2023   HGB 14.3 07/22/2023   HCT 40.7 07/22/2023   MCV 90.0 07/22/2023   PLT 220 07/22/2023     Assessment/Plan Glioblastoma, IDH-wildtype (HCC)  Focal seizures (HCC)  Hunter Terry presents today for cycle 2, day 15 carboplatin+avastin.  Labs are within normal limits.  Recommended proceeding with Carboplatin AUC 4 IV q4 weeks, with concurrent Avastin 10mg /kg IV q2 weeks.  We reviewed side effects, including nausea, fatigue, cytopenias.  Avastin has been previously well tolerated.   Chemotherapy should be held for the following:  ANC less than 1,000  Platelets less than 100,000  LFT or creatinine greater than 2x ULN  If clinical concerns/contraindications develop  Avastin should be held for the following:  ANC less than 500  Platelets less than 50,000  LFT or creatinine greater than 2x ULN  If clinical concerns/contraindications develop  Keppra will stay at 750mg  BID for now.  Ok with prilosec 20mg  daily for reflux symptoms.  We appreciate the opportunity to participate in the care of Hunter Terry.     We ask that [employer] return in 2 weeks for  cycle #3, day 1 carboplatin+avastin.  MRI scheduled for next week.  All questions were answered. The patient knows to call the clinic with any problems, questions or concerns. No barriers to learning were detected.  The total time spent in the encounter was 30 minutes and more than 50% was on counseling and review of test results   Henreitta Leber, MD Medical Director of Neuro-Oncology [facility] at [facility] 08/05/23 10:00 AM

## 2023-08-06 ENCOUNTER — Other Ambulatory Visit: Payer: Self-pay

## 2023-08-07 ENCOUNTER — Inpatient Hospital Stay: Payer: Medicare Other

## 2023-08-07 VITALS — BP 128/72 | HR 68 | Temp 98.2°F | Resp 18 | Wt 157.8 lb

## 2023-08-07 DIAGNOSIS — Z5111 Encounter for antineoplastic chemotherapy: Secondary | ICD-10-CM | POA: Diagnosis not present

## 2023-08-07 DIAGNOSIS — C719 Malignant neoplasm of brain, unspecified: Secondary | ICD-10-CM

## 2023-08-07 MED ORDER — SODIUM CHLORIDE 0.9 % IV SOLN
10.0000 mg/kg | Freq: Once | INTRAVENOUS | Status: AC
  Administered 2023-08-07: 700 mg via INTRAVENOUS
  Filled 2023-08-07: qty 16

## 2023-08-07 MED ORDER — SODIUM CHLORIDE 0.9 % IV SOLN: INTRAVENOUS | Status: DC

## 2023-08-08 ENCOUNTER — Other Ambulatory Visit: Payer: Self-pay

## 2023-08-09 ENCOUNTER — Ambulatory Visit (HOSPITAL_COMMUNITY)
Admission: RE | Admit: 2023-08-09 | Discharge: 2023-08-09 | Disposition: A | Discharge status: Home / Self Care | Payer: Medicare Other | Source: Ambulatory Visit | Attending: Internal Medicine | Admitting: Internal Medicine

## 2023-08-09 DIAGNOSIS — C719 Malignant neoplasm of brain, unspecified: Secondary | ICD-10-CM | POA: Insufficient documentation

## 2023-08-09 MED ORDER — GADOBUTROL 1 MMOL/ML IV SOLN
7.0000 mL | Freq: Once | INTRAVENOUS | Status: AC | PRN
  Administered 2023-08-09: 7 mL via INTRAVENOUS

## 2023-08-18 MED FILL — Fosaprepitant Dimeglumine For IV Infusion 150 MG (Base Eq): INTRAVENOUS | Qty: 5 | Status: AC

## 2023-08-19 ENCOUNTER — Inpatient Hospital Stay: Payer: Medicare Other | Admitting: Internal Medicine

## 2023-08-19 ENCOUNTER — Inpatient Hospital Stay: Payer: Medicare Other

## 2023-08-19 ENCOUNTER — Inpatient Hospital Stay: Payer: Medicare Other | Attending: Internal Medicine

## 2023-08-19 VITALS — BP 123/78 | HR 75 | Temp 97.7°F | Resp 18 | Ht 66.0 in | Wt 159.5 lb

## 2023-08-19 VITALS — BP 128/84 | HR 73 | Temp 97.6°F | Resp 16

## 2023-08-19 DIAGNOSIS — C719 Malignant neoplasm of brain, unspecified: Secondary | ICD-10-CM

## 2023-08-19 DIAGNOSIS — R569 Unspecified convulsions: Secondary | ICD-10-CM | POA: Insufficient documentation

## 2023-08-19 DIAGNOSIS — C712 Malignant neoplasm of temporal lobe: Secondary | ICD-10-CM | POA: Insufficient documentation

## 2023-08-19 DIAGNOSIS — Z5111 Encounter for antineoplastic chemotherapy: Secondary | ICD-10-CM | POA: Diagnosis present

## 2023-08-19 DIAGNOSIS — Z9221 Personal history of antineoplastic chemotherapy: Secondary | ICD-10-CM | POA: Insufficient documentation

## 2023-08-19 DIAGNOSIS — Z923 Personal history of irradiation: Secondary | ICD-10-CM | POA: Insufficient documentation

## 2023-08-19 DIAGNOSIS — R200 Anesthesia of skin: Secondary | ICD-10-CM | POA: Insufficient documentation

## 2023-08-19 DIAGNOSIS — Z79899 Other long term (current) drug therapy: Secondary | ICD-10-CM | POA: Diagnosis not present

## 2023-08-19 LAB — CBC WITH DIFFERENTIAL (CANCER CENTER ONLY)
Abs Immature Granulocytes: 0 10*3/uL (ref 0.00–0.07)
Basophils Absolute: 0 10*3/uL (ref 0.0–0.1)
Basophils Relative: 0 %
Eosinophils Absolute: 0.1 10*3/uL (ref 0.0–0.5)
Eosinophils Relative: 2 %
HCT: 40 % (ref 39.0–52.0)
Hemoglobin: 14 g/dL (ref 13.0–17.0)
Immature Granulocytes: 0 %
Lymphocytes Relative: 25 %
Lymphs Abs: 0.8 10*3/uL (ref 0.7–4.0)
MCH: 30.6 pg (ref 26.0–34.0)
MCHC: 35 g/dL (ref 30.0–36.0)
MCV: 87.3 fL (ref 80.0–100.0)
Monocytes Absolute: 0.4 10*3/uL (ref 0.1–1.0)
Monocytes Relative: 13 %
Neutro Abs: 2 10*3/uL (ref 1.7–7.7)
Neutrophils Relative %: 60 %
Platelet Count: 156 10*3/uL (ref 150–400)
RBC: 4.58 MIL/uL (ref 4.22–5.81)
RDW: 14 % (ref 11.5–15.5)
WBC Count: 3.3 10*3/uL — ABNORMAL LOW (ref 4.0–10.5)
nRBC: 0 % (ref 0.0–0.2)

## 2023-08-19 LAB — CMP (CANCER CENTER ONLY)
ALT: 37 U/L (ref 0–44)
AST: 32 U/L (ref 15–41)
Albumin: 4.3 g/dL (ref 3.5–5.0)
Alkaline Phosphatase: 129 U/L — ABNORMAL HIGH (ref 38–126)
Anion gap: 5 (ref 5–15)
BUN: 14 mg/dL (ref 8–23)
CO2: 30 mmol/L (ref 22–32)
Calcium: 9.5 mg/dL (ref 8.9–10.3)
Chloride: 94 mmol/L — ABNORMAL LOW (ref 98–111)
Creatinine: 0.79 mg/dL (ref 0.61–1.24)
GFR, Estimated: >60 mL/min (ref >60)
Glucose, Bld: 100 mg/dL — ABNORMAL HIGH (ref 70–99)
Potassium: 4.4 mmol/L (ref 3.5–5.1)
Sodium: 129 mmol/L — ABNORMAL LOW (ref 135–145)
Total Bilirubin: 0.5 mg/dL (ref 0.0–1.2)
Total Protein: 7.1 g/dL (ref 6.5–8.1)

## 2023-08-19 LAB — TOTAL PROTEIN, URINE DIPSTICK: Protein, ur: 30 mg/dL — AB

## 2023-08-19 MED ORDER — SODIUM CHLORIDE 0.9 % IV SOLN: INTRAVENOUS | Status: DC

## 2023-08-19 MED ORDER — SODIUM CHLORIDE 0.9 % IV SOLN
150.0000 mg | Freq: Once | INTRAVENOUS | Status: DC
  Filled 2023-08-19: qty 5

## 2023-08-19 MED ORDER — PALONOSETRON HCL INJECTION 0.25 MG/5ML: 0.2500 mg | Freq: Once | INTRAVENOUS | Status: DC

## 2023-08-19 MED ORDER — SODIUM CHLORIDE 0.9 % IV SOLN
10.0000 mg/kg | Freq: Once | INTRAVENOUS | Status: AC
  Administered 2023-08-19: 700 mg via INTRAVENOUS
  Filled 2023-08-19: qty 16

## 2023-08-19 NOTE — Progress Notes (Signed)
 [facility] [address]  [place], [place] [address] [phone]   Interval Evaluation  Date of Service: 08/19/23 [name] Patient MRN: [id number] Patient DOB: 1961/05/13 Provider: Arthea MARLA Manns, MD  Identifying Statement:  Vivek Grealish is a 63 y.o. male with left temporal glioblastoma   Oncologic History: Oncology History  Glioblastoma, IDH-wildtype (HCC)  06/15/2020 Surgery   Stereotactic biopsy, L temporal Cathy).  Path demonstrates glioblastoma IDH-1 wt   08/02/2020 Surgery   Debulking craniotomy with Dr. Debby   09/05/2020 - 10/17/2020 Radiation Therapy   IMRT with concurrent Temozolomide  Valene)   11/19/2020 - 06/18/2021 Chemotherapy   Completes #7 cycles adjuvant 5-day Temozolomide        07/17/2021 Progression   Progression of disease #1   07/18/2021 - 07/10/2022 Chemotherapy   Completes 8 cycles of CCNU 90mg /m2 PO q6 weeks and Avastin  10mg /kg IV q2 weeks   07/11/2022 Progression   Progression of disease #2   09/05/2022 Surgery   Repeat left temporal craniotomy with Dr. Debby; path is Glioblastoma IDHwt   09/19/2022 -  Chemotherapy   Initiates metronomic Temozolomide , 50mg /m2 PO daily   09/23/2022 - 09/23/2022 Chemotherapy   Patient is on Treatment Plan : BRAIN Low Grade Glioma Grade II, Glioblastoma,  Astrocytoma, Oligodendroma, Recurrent or Progressive / Temozolomide  D1-5 Q28 Days     10/27/2022 Progression   Progression of disease #3   11/04/2022 - 06/15/2023 Chemotherapy   Irinotecan  and Avastin  IV q2-3 weeks   06/16/2023 Progression   Progression of disease #4   06/24/2023 -  Chemotherapy   Patient is on Treatment Plan : Brain Bevacizumab  D1, D15 + Carboplatin  (4) D1 q28d x 6 cycles       Biomarkers:  MGMT Unknown.  IDH 1/2 Wild type.  EGFR Unknown  TERT Mutated   Interval History: Hunter Terry presents today following recent MRI brain.  His wife describes worsening right sided  weakness today.  He has been dropping objects, dropped dishes at home.  He is also dragging his right leg more than before, and losing his balance.  Also having additional trouble with understanding words and instructions.  No additional seizures.  He describes ongoing impaired vision on his right side, stable from prior.  Right arm and leg numbness persists.  Continues on Keppra  750mg  BID, no headaches.  H+P (07/24/20) Patient presented to medical attention in late October, 2021 with new onset seizure.  Event was decribed as loss of consciousness, sudden, without clarity on further details aside from altered mental status upon awakening.  CNS imaging demonsrated non-enhancing mass within left temporal lobe c/w likely glioma; he underwent stereotactic biopsy with Dr. Debby on 06/15/20.  There was significant delay in finalizing path, explaining delay in follow up and evaluation.  He denies any seizures since discharge from hospital, on 4 anti-seizure drugs.  He does complain of dizziness and drowsiness with the medications, however.  Also describes impaired short term memory.  Had worked as a naval architect. No further decadron .    Medications: Current Outpatient Medications on File Prior to Visit  Medication Sig Dispense Refill   acetaminophen  (TYLENOL ) 500 MG tablet Take 500 mg by mouth every 6 (six) hours as needed for moderate pain.     levETIRAcetam  (KEPPRA ) 750 MG tablet Take 1 tablet (750 mg total) by mouth 2 (two) times daily. 60 tablet 3   Multiple Vitamin (MULTIVITAMIN WITH MINERALS) TABS tablet Take 1 tablet by mouth daily.  omeprazole  (PRILOSEC) 20 MG capsule Take 1 capsule (20 mg total) by mouth daily. 30 capsule 1   ondansetron  (ZOFRAN ) 8 MG tablet Take 1 tablet (8 mg total) by mouth every 8 (eight) hours as needed for nausea or vomiting. 30 tablet 1   [DISCONTINUED] lacosamide  (VIMPAT ) 200 MG TABS tablet Take 0.5 tablets (100 mg total) by mouth 2 (two) times daily. 30 tablet 0   No  current facility-administered medications on file prior to visit.    Allergies: No Known Allergies Past Medical History:  Past Medical History:  Diagnosis Date   Cancer (HCC)    BRAIN TUMOR   Headache    Seizure [facility])    Past Surgical History:  Past Surgical History:  Procedure Laterality Date   APPLICATION OF CRANIAL NAVIGATION N/A 06/15/2020   Procedure: APPLICATION OF CRANIAL NAVIGATION;  Surgeon: Debby Dorn MATSU, MD;  Location: [facility] OR;  Service: Neurosurgery;  Laterality: N/A;   APPLICATION OF CRANIAL NAVIGATION N/A 08/02/2020   Procedure: APPLICATION OF CRANIAL NAVIGATION;  Surgeon: Debby Dorn MATSU, MD;  Location: [facility] OR;  Service: Neurosurgery;  Laterality: N/A;   APPLICATION OF CRANIAL NAVIGATION Left 09/05/2022   Procedure: APPLICATION OF CRANIAL NAVIGATION;  Surgeon: Debby Dorn MATSU, MD;  Location: [facility] OR;  Service: Neurosurgery;  Laterality: Left;   CRANIOTOMY N/A 08/02/2020   Procedure: CRANIOTOMY LEFT TEMPORAL LOBECTOMY FOR TUMOR EXCISION;  Surgeon: Debby Dorn MATSU, MD;  Location: [facility] OR;  Service: Neurosurgery;  Laterality: N/A;   CRANIOTOMY Left 09/05/2022   Procedure: CRANIOTOMY FOR RESECTION OF TEMPORAL LOBE TUMOR;  Surgeon: Debby Dorn MATSU, MD;  Location: [facility] OR;  Service: Neurosurgery;  Laterality: Left;   FRAMELESS  BIOPSY WITH BRAINLAB Left 06/15/2020   Procedure: Left temporal lobe stereotactic brain biopsy with brainlab;  Surgeon: Debby Dorn MATSU, MD;  Location: [facility] OR;  Service: Neurosurgery;  Laterality: Left;   Social History:  Social History   Socioeconomic History   Marital status: Married    Spouse name: Not on file   Number of children: Not on file   Years of education: Not on file   Highest education level: Not on file  Occupational History   Not on file  Tobacco Use   Smoking status: Never   Smokeless tobacco: Never  Vaping Use   Vaping status: Never Used  Substance and Sexual Activity   Alcohol use: Yes    Comment: 1 beer a month    Drug use: Never   Sexual activity: Yes    Partners: Female  Other Topics Concern   Not on file  Social History Narrative   Not on file   Social Drivers of Health   Financial Resource Strain: Not on file  Food Insecurity: Not on file  Transportation Needs: Not on file  Physical Activity: Not on file  Stress: Not on file  Social Connections: Unknown (12/24/2021)   [facility]   Social Network    Social Network: Not on file  Intimate Partner Violence: Unknown (11/13/2021)   [facility] from [facility], [facility]   HITS    Physically Hurt: Not on file    Insult or Talk Down To: Not on file    Threaten Physical Harm: Not on file    Scream or Curse: Not on file   Family History:  Family History  Problem Relation Age of Onset   Cancer Neg Hx     Review of Systems: Constitutional: Doesn't report fevers, chills or abnormal weight loss  Eyes: Doesn't report blurriness of vision Ears, nose, mouth, throat, and face: Doesn't report sore throat Respiratory: Doesn't report cough, dyspnea or wheezes Cardiovascular: Doesn't report palpitation, chest discomfort  Gastrointestinal:  Doesn't report nausea, constipation, diarrhea GU: Doesn't report incontinence Skin: Doesn't report skin rashes Neurological: Per HPI Musculoskeletal: Doesn't report joint pain Behavioral/Psych: Doesn't report anxiety  Physical Exam: Vitals:   08/19/23 1451  BP: 123/78  Pulse: 75  Resp: 18  Temp: 97.7 F (36.5 C)  SpO2: 100%    KPS: 70. General: Alert, cooperative, pleasant, in no acute distress Head: Normal EENT: No conjunctival injection or scleral icterus.  Lungs: Resp effort normal Cardiac: Regular rate Abdomen: Non-distended abdomen Skin: No rashes cyanosis or petechiae. Extremities: No clubbing or edema  Neurologic Exam: Mental Status: Awake, alert, attentive to examiner. Oriented to self and environment. Language exhibits mildly impaired fluency and  comprehension.  Cranial Nerves: Visual acuity is grossly normal. Right homonymous hemianopia. Extra-ocular movements intact. No ptosis. Face is symmetric Motor: Tone and bulk are normal. Power is 4/5 in right arm and leg with motor neglect. Reflexes are symmetric, no pathologic reflexes present.  Sensory: Intact to light touch Gait: Hemiparetic   Labs: I have reviewed the data as listed    Component Value Date/Time   NA 129 (L) 08/19/2023 1406   K 4.4 08/19/2023 1406   CL 94 (L) 08/19/2023 1406   CO2 30 08/19/2023 1406   GLUCOSE 100 (H) 08/19/2023 1406   BUN 14 08/19/2023 1406   CREATININE 0.79 08/19/2023 1406   CALCIUM 9.5 08/19/2023 1406   PROT 7.1 08/19/2023 1406   ALBUMIN 4.3 08/19/2023 1406   AST 32 08/19/2023 1406   ALT 37 08/19/2023 1406   ALKPHOS 129 (H) 08/19/2023 1406   BILITOT 0.5 08/19/2023 1406   GFRNONAA >60 08/19/2023 1406   Lab Results  Component Value Date   WBC 3.3 (L) 08/19/2023   NEUTROABS 2.0 08/19/2023   HGB 14.0 08/19/2023   HCT 40.0 08/19/2023   MCV 87.3 08/19/2023   PLT 156 08/19/2023    Imaging:  [facility] Clinician Interpretation: I have personally reviewed the CNS images as listed.  My interpretation, in the context of the patient's clinical presentation, is progressive disease  MR BRAIN W WO CONTRAST Result Date: 08/09/2023 CLINICAL DATA:  Brain/CNS neoplasm, assess treatment response. Glioblastoma. EXAM: MRI HEAD WITHOUT AND WITH CONTRAST TECHNIQUE: Multiplanar, multiecho pulse sequences of the brain and surrounding structures were obtained without and with intravenous contrast. CONTRAST:  7mL GADAVIST  GADOBUTROL  1 MMOL/ML IV SOLN COMPARISON:  06/11/2023 and multiple previous FINDINGS: Brain: Worsening of the imaging findings with increasing mass effect throughout the region of residual infiltrating tumor affecting the left temporal lobe, inferior frontal lobe, frontal operculum and frontoparietal junction region. More effacement of the sulci in the  region and slight increased mass effect upon the left lateral ventricle. Slight increase in volume of the enhancing tissue throughout, without a prominent or dramatic change no evidence of obstructive hydrocephalus. Ependymal enhancement along the lateral wall of the atrium of left lateral ventricle is similar. Vascular: Major vessels at the base of the brain show flow. Skull and upper cervical spine: No significant nonsurgical finding. Sinuses/Orbits: Ordinary seasonal mucosal inflammatory changes particularly of the left maxillary sinus, improved since October. Orbits negative. Other: None IMPRESSION: Worsening of the imaging findings with increasing mass effect throughout the region of residual infiltrating tumor affecting the left temporal lobe, inferior frontal lobe, frontal operculum and frontoparietal junction region. Slight increase in volume  of the enhancing tissue throughout, without a prominent or dramatic change. No evidence of obstructive hydrocephalus. Electronically Signed   By: Oneil Officer M.D.   On: 08/09/2023 14:36    Assessment/Plan Glioblastoma, IDH-wildtype (HCC)  Focal seizures (HCC)  Kholton Gudino is clinically progressive today, with worsening motor function and decline in comprehensive langauge.    MRI brain confirms progression of disease with infiltration of enhancing tumor medially into posterior limb of internal capsule.   We reviewed goals of care in detail today.  He understands his options for additional salvage chemotherapy remain limited.    Recommended holding further Carboplatin .  Discussed and recommended re-irradiation per RTOG 1205 protocol, which recommends 35/10 with concurrent Avastin  10mg /kg IV q2 weeks.  We reviewed side effects, including nausea, fatigue, cytopenias.  Avastin  has been previously well tolerated.   Avastin  should be held for the following:  ANC less than 500  Platelets less than 50,000  LFT or creatinine greater than 2x ULN  If  clinical concerns/contraindications develop  Keppra  will stay at 750mg  BID for now.  Ok with prilosec 20mg  daily for reflux symptoms.  We appreciate the opportunity to participate in the care of Detrell Mammoth.     We ask that [employer] return in 2 weeks avastin , hopefully radiation can be scheduled and initiated within ~1 week.  All questions were answered. The patient knows to call the clinic with any problems, questions or concerns. No barriers to learning were detected.  The total time spent in the encounter was 40 minutes and more than 50% was on counseling and review of test results   Arthea MARLA Manns, MD Medical Director of Neuro-Oncology [facility] at [place] 08/19/23 3:08 PM

## 2023-08-19 NOTE — Patient Instructions (Signed)
 Bevacizumab  Injection Qu es este medicamento? El BEVACIZUMAB  trata algunos tipos de cncer. Acta bloqueando una protena que hace que las clulas cancerosas crezcan y se multipliquen. Esto ayuda a neurosurgeon propagacin de las clulas cancerosas. Es un anticuerpo monoclonal. Este medicamento puede ser utilizado para otros usos; si tiene alguna pregunta consulte con su proveedor de atencin mdica o con su farmacutico. MARCAS COMUNES: Alymsys , Avastin , MVASI , Zirabev  Qu le debo informar a mi profesional de la salud antes de tomar este medicamento? Necesitan saber si usted presenta alguno de los siguientes problemas o situaciones: Cogulos sanguneos Tos con sangre Ciruga programada o reciente Insuficiencia cardiaca Presin arterial alta Antecedentes de una conexin entre 2 o ms partes del cuerpo que por lo general no estn conectadas (fstula) Antecedentes de un desgarro en el estmago o los intestinos Protena en la orina Una reaccin alrgica o inusual al bevacizumab , a otros medicamentos, alimentos, colorantes o conservantes Si est embarazada o buscando quedar embarazada Si est amamantando a un beb Cmo debo utilizar este medicamento? Este medicamento se inyecta en una vena. Su equipo de atencin lo auto-owners insurance en un hospital o en un entorno clnico. Hable con su equipo de atencin sobre el uso de este medicamento en nios. Puede requerir atencin especial. Sobredosis: Pngase en contacto inmediatamente con un centro toxicolgico o una sala de urgencia si usted cree que haya tomado demasiado medicamento.<br>ATENCIN: Reynolds american es solo para usted. No comparta este medicamento con nadie. Qu sucede si me olvido de una dosis? Cumpla con las citas para dosis de seguimiento. Es importante no olvidar ninguna dosis. Llame a su equipo de atencin si no puede asistir a una cita. Qu puede interactuar con este medicamento? No se anticipan interacciones. Puede ser  que esta lista no menciona todas las posibles interacciones. Informe a su profesional de beazer homes de ingram micro inc productos a base de hierbas, medicamentos de [place] o suplementos nutritivos que est tomando. Si usted fuma, consume bebidas alcohlicas o si utiliza drogas ilegales, indqueselo tambin a su profesional de beazer homes. Algunas sustancias pueden interactuar con su medicamento. A qu debo estar atento al usar ppl corporation? Se supervisar su estado de salud atentamente mientras reciba este medicamento. Usted podra necesitar realizarse anlisis de sangre mientras est usando este medicamento. Este medicamento podra hacerle sentir un risk analyst. Esto no es inusual, ya que la quimioterapia puede afectar tanto a las clulas sanas como a las clulas cancerosas. Si presenta algn efecto secundario, infrmelo. Contine con el tratamiento incluso si se siente enfermo, a menos que su equipo de 3m company lo suspenda. Este medicamento podra aumentar el riesgo de moretones o sangrado. Llame a su equipo de atencin si observa sangrados inusuales. Antes de realizarse una ciruga, hable con su equipo de atencin para asegurarse de que no hay ningn problema. Este medicamento puede aumentar el riesgo de que el sitio o la herida quirrgica no sanen correctamente. Tendr que dejar de usar este medicamento durante [address] antes de la ciruga. Despus de la ciruga, espere al menos [address] antes de reiniciar el uso de [place]. Asegrese de que el sitio o la herida quirrgica haya sanado lo suficiente antes de games developer el uso del medicamento. Hable con su equipo de atencin si tiene alguna pregunta. Hable con su equipo de atencin si podra estar embarazada. Este medicamento puede causar defectos congnitos graves si se usa  durante el embarazo y por 6 meses despus de la ltima dosis. Se recomienda utilizar un mtodo  anticonceptivo mientras est usando este medicamento y por 6 meses  despus de la ltima dosis. Su equipo de atencin mdica puede ayudarle a clinical research associate la opcin que mejor se adapte a sus necesidades. No debe amamantar a un beb mientras usa  este medicamento y por 6 meses despus de la ltima dosis. Este medicamento puede causar infertilidad. Hable con su equipo de atencin si le preocupa su fertilidad. Qu efectos secundarios puedo tener al boston scientific este medicamento? Efectos secundarios que debe informar a su equipo de atencin tan pronto como sea posible: Reacciones alrgicas: erupcin cutnea, comezn/picazn, urticaria, hinchazn de la cara, los labios, la lengua o la garganta Sangrado: heces con [place], o de color negro y water engineer alquitranado, vomitar sangre o material marrn que tiene el aspecto de posos (residuos) de caf, orina de color rojo o marrn oscuro, pequeas manchas rojas o moradas en la piel, sangrado o moretones inusuales Cogulo sanguneo: dolor, hinchazn, calor en una pierna, falta de aire, dolor en el pecho Ataque cardiaco: dolor u opresin en el pecho, los hombros, los brazos o la [place], nuseas, falta de [place], piel fra o sudorosa, sensacin de [place] o aturdimiento Insuficiencia cardiaca: falta de aire, hinchazn de los tobillos, los pies o las manos, aumento de peso repentino, debilidad o fatiga inusuales Aumento de la presin arterial Infeccin: fiebre, escalofros, tos, dolor de garganta, heridas que no sanan, dolor o problemas para geographical information systems officer, sensacin general de molestia o Medical Laboratory Scientific Officer a la infusin: journalist, newspaper, falta de aire o dificultad para respirar, sensacin de desmayo o aturdimiento [employer] riones: disminucin en la cantidad de orina, hinchazn de los tobillos, las manos o los pies Dolor de estmago intenso que no desaparece o insurance underwriter Accidente cerebrovascular: entumecimiento o debilidad repentinos de la cara, un brazo o una pierna, dificultad para hablar, confusin, dificultad para caminar, prdida de  equilibrio o coordinacin, mareos, dolor de cabeza intenso, cambio en la visin Dolor de cabeza repentino e intenso, confusin, cambio en la visin, convulsiones, que podran ser signos de sndrome de encefalopata posterior reversible Efectos secundarios que generalmente no requieren atencin mdica (debe informarlos a su equipo de atencin si persisten o si son molestos): Dolor de Cytogeneticist en el sentido del gusto Diarrea Piel seca Aumento de lgrimas Sangrado por la nariz Puede ser que esta lista no menciona todos los posibles efectos secundarios. Comunquese a su mdico por asesoramiento mdico [employer]. Usted puede informar los efectos secundarios a la FDA por telfono al 1-800-FDA-1088. Dnde debo guardar mi medicina? Este medicamento se administra en hospitales o clnicas. No se guarda en su casa. <b>ATENCIN: Este folleto es un resumen. Puede ser que no cubra toda la posible informacin. Si usted tiene preguntas acerca de esta medicina, consulte con su mdico, su farmacutico o su profesional de radiographer, therapeutic.</b>  2024 Elsevier/Gold Standard (2022-06-19 00:00:00)

## 2023-08-20 ENCOUNTER — Telehealth: Payer: Self-pay

## 2023-08-20 ENCOUNTER — Telehealth: Payer: Self-pay | Admitting: Internal Medicine

## 2023-08-20 ENCOUNTER — Telehealth: Payer: Self-pay | Admitting: Radiology

## 2023-08-20 NOTE — Telephone Encounter (Signed)
 Marland Kitchen

## 2023-08-20 NOTE — Telephone Encounter (Signed)
 TC from pt's interpretor, Julie Sowell. She states that pt's wife contacted her today to request that she let Dr. Buckley know that pt does not want to have additional radiation treatments. Wife reports that pt is in a clearer state of mind today and has made this decision because he thinks the radiation is making his symptoms worse. Interpretor states that pt is aware of his decline, but he only wants to continue Avastin  treatment at this time. She is going to let Dr. Dewey know this as well.

## 2023-08-20 NOTE — Telephone Encounter (Signed)
 With the assistance of interpreter service, I just spoke with the wife of Hunter Terry and she stated the pt does not wish to proceed with Radiation Therapy and doesn't want to set up a re-consult appt with Donald at this time. I have notified Dr. Buckley and Dr. Dewey along with additional support staff.

## 2023-08-20 NOTE — Telephone Encounter (Signed)
 Dr. Buckley informed of pt's wishes to d/c radiation and receive only Avastin  for treatment, and he states this is not an option for patient. He says that pt will need a hospice referral if he does not want to continue radiation along with Avastin . TC to pt's wife to inform of this w/ limited Spanish-speaking ability. She states she understands that pt cannot receive Avastin  if he chooses to stop radiation, but requested the phone number of Recardo Rhein, the interpretor which she spoke w/ earlier today, to ensure that she and pt understand the treatment options. Wife gave the phone number for Recardo Rhein [phone]), and after calling and receiving no answer, VM left for her to call Dr. Eward office concerning the pt we discussed earlier today. Will notify Dr. Eward RN for tomorrow to ensure follow up.

## 2023-08-21 ENCOUNTER — Telehealth: Payer: Self-pay | Admitting: *Deleted

## 2023-08-21 ENCOUNTER — Other Ambulatory Visit: Payer: Self-pay

## 2023-08-21 NOTE — Telephone Encounter (Signed)
 Contacted Julie Sowell, Spanish Language Interpreter for [employer] to ask if she spoke with patient/wife yesterday regarding treatment options and transition to hospice care as indicated in previous message. Mliss states she has spoken with them. Per Mliss, they understand that stopping treatment means stopping both radiation and Avastin  as the treatment includes both and one is not given without the other. Mliss states Ms. Allston understands that hospice care will be focused on patient's comfort and he will stop both radiation and chemotherapy at [facility].   Dr. Buckley informed of patient's wishes to d/c radiation and Avastin  per Julie's conversation with them. He gave verbal order for hospice care. Contacted Mliss to inform that Dr. Buckley had ordered referral. She states she will contact patient's wife to inform her that referral to hospice was ordered.

## 2023-08-21 NOTE — Telephone Encounter (Signed)
 Contacted Dwayne Nail RN w/[facility] to provide referral to hospice per Dr. Eward verbal order.  Informed her that symptom management will be thru Dr. Buckley.  Advised her that patient/wife will need spanish interpreter as they are spanish speakers. Dwayne noted all information given and stated they will utilize interpreter when contacting patient.

## 2023-08-22 NOTE — Telephone Encounter (Signed)
 T/C from [facility] with [facility], per pt's request they will have their admission visit to Hospice on Monday 08/25/23

## 2023-08-28 ENCOUNTER — Telehealth: Payer: Self-pay | Admitting: *Deleted

## 2023-08-28 NOTE — Telephone Encounter (Signed)
Hospice certification & Plan of Care signed by Dr Barbaraann Cao & faxed to [facility], [phone].  Fax confirmation received.

## 2023-08-29 ENCOUNTER — Telehealth: Payer: Self-pay | Admitting: Internal Medicine

## 2023-08-29 NOTE — Telephone Encounter (Signed)
 Marland Kitchen

## 2023-08-31 ENCOUNTER — Other Ambulatory Visit: Payer: Self-pay

## 2023-09-02 ENCOUNTER — Inpatient Hospital Stay: Payer: Medicare Other | Admitting: Internal Medicine

## 2023-09-02 ENCOUNTER — Inpatient Hospital Stay: Payer: Medicare Other

## 2023-09-16 ENCOUNTER — Inpatient Hospital Stay: Payer: Medicare Other

## 2023-09-16 ENCOUNTER — Inpatient Hospital Stay: Payer: Medicare Other | Admitting: Internal Medicine

## 2023-09-16 ENCOUNTER — Ambulatory Visit: Payer: Medicare Other

## 2023-09-25 ENCOUNTER — Other Ambulatory Visit: Payer: Self-pay

## 2023-09-30 ENCOUNTER — Other Ambulatory Visit: Payer: Medicare Other

## 2023-09-30 ENCOUNTER — Ambulatory Visit: Payer: Medicare Other

## 2023-09-30 ENCOUNTER — Ambulatory Visit: Payer: Medicare Other | Admitting: Internal Medicine

## 2023-10-11 DEATH — deceased
